# Patient Record
Sex: Female | Born: 1955 | Race: White | Hispanic: No | Marital: Married | Smoking: Never smoker
Health system: Southern US, Community
[De-identification: ages and names within clinical notes are randomized; demographics above are authoritative.]

## PROBLEM LIST (undated history)

## (undated) DIAGNOSIS — IMO0002 Reserved for concepts with insufficient information to code with codable children: Secondary | ICD-10-CM

## (undated) DIAGNOSIS — Z9889 Other specified postprocedural states: Secondary | ICD-10-CM

## (undated) DIAGNOSIS — K3184 Gastroparesis: Secondary | ICD-10-CM

## (undated) DIAGNOSIS — R51 Headache: Secondary | ICD-10-CM

## (undated) DIAGNOSIS — F419 Anxiety disorder, unspecified: Secondary | ICD-10-CM

## (undated) DIAGNOSIS — N189 Chronic kidney disease, unspecified: Principal | ICD-10-CM

## (undated) DIAGNOSIS — Z8719 Personal history of other diseases of the digestive system: Secondary | ICD-10-CM

## (undated) DIAGNOSIS — E119 Type 2 diabetes mellitus without complications: Secondary | ICD-10-CM

## (undated) DIAGNOSIS — I10 Essential (primary) hypertension: Secondary | ICD-10-CM

## (undated) DIAGNOSIS — M1990 Unspecified osteoarthritis, unspecified site: Secondary | ICD-10-CM

## (undated) DIAGNOSIS — R112 Nausea with vomiting, unspecified: Secondary | ICD-10-CM

## (undated) DIAGNOSIS — K219 Gastro-esophageal reflux disease without esophagitis: Secondary | ICD-10-CM

## (undated) DIAGNOSIS — G4730 Sleep apnea, unspecified: Secondary | ICD-10-CM

## (undated) DIAGNOSIS — D631 Anemia in chronic kidney disease: Secondary | ICD-10-CM

## (undated) DIAGNOSIS — R519 Headache, unspecified: Secondary | ICD-10-CM

## (undated) HISTORY — DX: Anemia in chronic kidney disease: D63.1

## (undated) HISTORY — PX: CARDIAC CATHETERIZATION: SHX172

## (undated) HISTORY — PX: EYE SURGERY: SHX253

## (undated) HISTORY — PX: CARPAL TUNNEL RELEASE: SHX101

## (undated) HISTORY — DX: Chronic kidney disease, unspecified: N18.9

---

## 1990-06-26 HISTORY — PX: ABDOMINAL HYSTERECTOMY: SUR658

## 1999-04-28 ENCOUNTER — Encounter: Admission: RE | Admit: 1999-04-28 | Discharge: 1999-04-28 | Payer: Self-pay | Admitting: Family Medicine

## 1999-04-28 ENCOUNTER — Encounter: Payer: Self-pay | Admitting: Family Medicine

## 2001-04-30 ENCOUNTER — Encounter: Admission: RE | Admit: 2001-04-30 | Discharge: 2001-04-30 | Payer: Self-pay | Admitting: Family Medicine

## 2001-04-30 ENCOUNTER — Encounter: Payer: Self-pay | Admitting: Family Medicine

## 2001-05-17 ENCOUNTER — Encounter: Admission: RE | Admit: 2001-05-17 | Discharge: 2001-08-15 | Payer: Self-pay | Admitting: Family Medicine

## 2001-06-03 ENCOUNTER — Encounter: Payer: Self-pay | Admitting: Emergency Medicine

## 2001-06-03 ENCOUNTER — Emergency Department (HOSPITAL_COMMUNITY): Admission: EM | Admit: 2001-06-03 | Discharge: 2001-06-03 | Payer: Self-pay | Admitting: Emergency Medicine

## 2001-07-07 ENCOUNTER — Encounter: Payer: Self-pay | Admitting: Emergency Medicine

## 2001-07-07 ENCOUNTER — Observation Stay (HOSPITAL_COMMUNITY): Admission: EM | Admit: 2001-07-07 | Discharge: 2001-07-08 | Payer: Self-pay | Admitting: Emergency Medicine

## 2002-04-01 ENCOUNTER — Encounter: Admission: RE | Admit: 2002-04-01 | Discharge: 2002-04-01 | Payer: Self-pay | Admitting: Family Medicine

## 2002-04-01 ENCOUNTER — Encounter: Payer: Self-pay | Admitting: Family Medicine

## 2002-06-05 ENCOUNTER — Encounter: Admission: RE | Admit: 2002-06-05 | Discharge: 2002-06-05 | Payer: Self-pay | Admitting: Family Medicine

## 2002-06-05 ENCOUNTER — Encounter: Payer: Self-pay | Admitting: Family Medicine

## 2002-06-30 ENCOUNTER — Ambulatory Visit (HOSPITAL_COMMUNITY): Admission: RE | Admit: 2002-06-30 | Discharge: 2002-06-30 | Payer: Self-pay | Admitting: *Deleted

## 2002-09-04 ENCOUNTER — Encounter: Payer: Self-pay | Admitting: Family Medicine

## 2002-09-04 ENCOUNTER — Encounter: Admission: RE | Admit: 2002-09-04 | Discharge: 2002-09-04 | Payer: Self-pay | Admitting: Family Medicine

## 2002-09-19 ENCOUNTER — Ambulatory Visit (HOSPITAL_BASED_OUTPATIENT_CLINIC_OR_DEPARTMENT_OTHER): Admission: RE | Admit: 2002-09-19 | Discharge: 2002-09-19 | Payer: Self-pay | Admitting: *Deleted

## 2002-10-10 ENCOUNTER — Ambulatory Visit (HOSPITAL_COMMUNITY): Admission: RE | Admit: 2002-10-10 | Discharge: 2002-10-10 | Payer: Self-pay | Admitting: *Deleted

## 2002-10-10 ENCOUNTER — Encounter: Payer: Self-pay | Admitting: *Deleted

## 2002-10-30 ENCOUNTER — Ambulatory Visit (HOSPITAL_COMMUNITY): Admission: RE | Admit: 2002-10-30 | Discharge: 2002-10-30 | Payer: Self-pay | Admitting: *Deleted

## 2003-10-14 ENCOUNTER — Other Ambulatory Visit (HOSPITAL_COMMUNITY): Admission: RE | Admit: 2003-10-14 | Discharge: 2003-10-23 | Payer: Self-pay | Admitting: Psychiatry

## 2004-07-01 ENCOUNTER — Observation Stay (HOSPITAL_COMMUNITY): Admission: EM | Admit: 2004-07-01 | Discharge: 2004-07-03 | Payer: Self-pay | Admitting: Emergency Medicine

## 2005-05-01 ENCOUNTER — Encounter: Admission: RE | Admit: 2005-05-01 | Discharge: 2005-05-01 | Payer: Self-pay | Admitting: Family Medicine

## 2005-09-28 ENCOUNTER — Encounter: Admission: RE | Admit: 2005-09-28 | Discharge: 2005-09-28 | Payer: Self-pay | Admitting: Family Medicine

## 2006-07-13 ENCOUNTER — Ambulatory Visit: Payer: Self-pay | Admitting: Pulmonary Disease

## 2006-12-26 ENCOUNTER — Encounter: Admission: RE | Admit: 2006-12-26 | Discharge: 2006-12-26 | Payer: Self-pay | Admitting: Family Medicine

## 2007-01-30 ENCOUNTER — Ambulatory Visit (HOSPITAL_COMMUNITY): Admission: RE | Admit: 2007-01-30 | Discharge: 2007-01-30 | Payer: Self-pay | Admitting: Surgery

## 2007-01-31 ENCOUNTER — Ambulatory Visit (HOSPITAL_COMMUNITY): Admission: RE | Admit: 2007-01-31 | Discharge: 2007-01-31 | Payer: Self-pay | Admitting: Surgery

## 2007-02-01 ENCOUNTER — Encounter: Admission: RE | Admit: 2007-02-01 | Discharge: 2007-03-26 | Payer: Self-pay | Admitting: Surgery

## 2007-03-22 ENCOUNTER — Ambulatory Visit (HOSPITAL_COMMUNITY): Admission: RE | Admit: 2007-03-22 | Discharge: 2007-03-22 | Payer: Self-pay | Admitting: Surgery

## 2007-06-04 ENCOUNTER — Encounter: Admission: RE | Admit: 2007-06-04 | Discharge: 2007-09-02 | Payer: Self-pay | Admitting: Surgery

## 2007-06-25 ENCOUNTER — Inpatient Hospital Stay (HOSPITAL_COMMUNITY): Admission: RE | Admit: 2007-06-25 | Discharge: 2007-06-27 | Payer: Self-pay | Admitting: Surgery

## 2007-06-26 ENCOUNTER — Encounter (INDEPENDENT_AMBULATORY_CARE_PROVIDER_SITE_OTHER): Payer: Self-pay | Admitting: Surgery

## 2007-06-26 ENCOUNTER — Ambulatory Visit: Payer: Self-pay | Admitting: Vascular Surgery

## 2007-06-27 HISTORY — PX: GASTRIC BYPASS: SHX52

## 2007-10-10 ENCOUNTER — Encounter: Admission: RE | Admit: 2007-10-10 | Discharge: 2007-10-10 | Payer: Self-pay | Admitting: Surgery

## 2009-09-10 ENCOUNTER — Emergency Department (HOSPITAL_BASED_OUTPATIENT_CLINIC_OR_DEPARTMENT_OTHER): Admission: EM | Admit: 2009-09-10 | Discharge: 2009-09-10 | Payer: Self-pay | Admitting: Emergency Medicine

## 2009-11-13 ENCOUNTER — Inpatient Hospital Stay (HOSPITAL_COMMUNITY): Admission: EM | Admit: 2009-11-13 | Discharge: 2009-11-15 | Payer: Self-pay | Admitting: Emergency Medicine

## 2009-11-14 ENCOUNTER — Encounter (INDEPENDENT_AMBULATORY_CARE_PROVIDER_SITE_OTHER): Payer: Self-pay | Admitting: Internal Medicine

## 2010-02-09 ENCOUNTER — Encounter: Admission: RE | Admit: 2010-02-09 | Discharge: 2010-02-09 | Payer: Self-pay | Admitting: Obstetrics and Gynecology

## 2010-09-12 LAB — COMPREHENSIVE METABOLIC PANEL
ALT: 13 U/L (ref 0–35)
AST: 14 U/L (ref 0–37)
Albumin: 3.9 g/dL (ref 3.5–5.2)
Alkaline Phosphatase: 45 U/L (ref 39–117)
BUN: 54 mg/dL — ABNORMAL HIGH (ref 6–23)
CO2: 26 mEq/L (ref 19–32)
Calcium: 9.7 mg/dL (ref 8.4–10.5)
Chloride: 104 mEq/L (ref 96–112)
Creatinine, Ser: 0.97 mg/dL (ref 0.4–1.2)
GFR calc Af Amer: >60 mL/min (ref >60)
GFR calc non Af Amer: 60 mL/min — ABNORMAL LOW (ref >60)
Glucose, Bld: 283 mg/dL — ABNORMAL HIGH (ref 70–99)
Potassium: 5.1 mEq/L (ref 3.5–5.1)
Sodium: 137 mEq/L (ref 135–145)
Total Bilirubin: 0.9 mg/dL (ref 0.3–1.2)
Total Protein: 6.9 g/dL (ref 6.0–8.3)

## 2010-09-12 LAB — GLUCOSE, CAPILLARY
Glucose-Capillary: 102 mg/dL — ABNORMAL HIGH (ref 70–99)
Glucose-Capillary: 119 mg/dL — ABNORMAL HIGH (ref 70–99)
Glucose-Capillary: 124 mg/dL — ABNORMAL HIGH (ref 70–99)
Glucose-Capillary: 143 mg/dL — ABNORMAL HIGH (ref 70–99)
Glucose-Capillary: 147 mg/dL — ABNORMAL HIGH (ref 70–99)
Glucose-Capillary: 148 mg/dL — ABNORMAL HIGH (ref 70–99)
Glucose-Capillary: 177 mg/dL — ABNORMAL HIGH (ref 70–99)
Glucose-Capillary: 177 mg/dL — ABNORMAL HIGH (ref 70–99)
Glucose-Capillary: 211 mg/dL — ABNORMAL HIGH (ref 70–99)
Glucose-Capillary: 217 mg/dL — ABNORMAL HIGH (ref 70–99)
Glucose-Capillary: 220 mg/dL — ABNORMAL HIGH (ref 70–99)
Glucose-Capillary: 245 mg/dL — ABNORMAL HIGH (ref 70–99)

## 2010-09-12 LAB — TYPE AND SCREEN
ABO/RH(D): O POS
Antibody Screen: NEGATIVE

## 2010-09-12 LAB — DIFFERENTIAL
Basophils Absolute: 0 10*3/uL (ref 0.0–0.1)
Basophils Relative: 0 % (ref 0–1)
Eosinophils Absolute: 0 10*3/uL (ref 0.0–0.7)
Eosinophils Relative: 0 % (ref 0–5)
Lymphocytes Relative: 13 % (ref 12–46)
Lymphs Abs: 1.7 10*3/uL (ref 0.7–4.0)
Monocytes Absolute: 0.5 10*3/uL (ref 0.1–1.0)
Monocytes Relative: 4 % (ref 3–12)
Neutro Abs: 11.4 10*3/uL — ABNORMAL HIGH (ref 1.7–7.7)
Neutrophils Relative %: 83 % — ABNORMAL HIGH (ref 43–77)

## 2010-09-12 LAB — CBC
HCT: 30.8 % — ABNORMAL LOW (ref 36.0–46.0)
Hemoglobin: 10.2 g/dL — ABNORMAL LOW (ref 12.0–15.0)
MCHC: 33 g/dL (ref 30.0–36.0)
MCV: 89.1 fL (ref 78.0–100.0)
Platelets: 298 10*3/uL (ref 150–400)
RBC: 3.46 MIL/uL — ABNORMAL LOW (ref 3.87–5.11)
RDW: 13.8 % (ref 11.5–15.5)
WBC: 13.7 10*3/uL — ABNORMAL HIGH (ref 4.0–10.5)

## 2010-09-12 LAB — POCT I-STAT, CHEM 8
BUN: 54 mg/dL — ABNORMAL HIGH (ref 6–23)
Calcium, Ion: 1.3 mmol/L (ref 1.12–1.32)
Chloride: 106 mEq/L (ref 96–112)
Creatinine, Ser: 0.8 mg/dL (ref 0.4–1.2)
Glucose, Bld: 272 mg/dL — ABNORMAL HIGH (ref 70–99)
HCT: 33 % — ABNORMAL LOW (ref 36.0–46.0)
Hemoglobin: 11.2 g/dL — ABNORMAL LOW (ref 12.0–15.0)
Potassium: 5.2 mEq/L — ABNORMAL HIGH (ref 3.5–5.1)
Sodium: 138 mEq/L (ref 135–145)
TCO2: 25 mmol/L (ref 0–100)

## 2010-09-12 LAB — PROTIME-INR
INR: 1.15 (ref 0.00–1.49)
Prothrombin Time: 14.6 seconds (ref 11.6–15.2)

## 2010-09-12 LAB — HEMOGLOBIN AND HEMATOCRIT, BLOOD
HCT: 23.3 % — ABNORMAL LOW (ref 36.0–46.0)
HCT: 24.9 % — ABNORMAL LOW (ref 36.0–46.0)
HCT: 25.2 % — ABNORMAL LOW (ref 36.0–46.0)
HCT: 25.7 % — ABNORMAL LOW (ref 36.0–46.0)
HCT: 25.9 % — ABNORMAL LOW (ref 36.0–46.0)
HCT: 27.1 % — ABNORMAL LOW (ref 36.0–46.0)
Hemoglobin: 7.6 g/dL — ABNORMAL LOW (ref 12.0–15.0)
Hemoglobin: 8.2 g/dL — ABNORMAL LOW (ref 12.0–15.0)
Hemoglobin: 8.4 g/dL — ABNORMAL LOW (ref 12.0–15.0)
Hemoglobin: 8.5 g/dL — ABNORMAL LOW (ref 12.0–15.0)
Hemoglobin: 8.6 g/dL — ABNORMAL LOW (ref 12.0–15.0)
Hemoglobin: 8.8 g/dL — ABNORMAL LOW (ref 12.0–15.0)

## 2010-09-12 LAB — BASIC METABOLIC PANEL
BUN: 39 mg/dL — ABNORMAL HIGH (ref 6–23)
CO2: 25 mEq/L (ref 19–32)
Calcium: 8.5 mg/dL (ref 8.4–10.5)
Chloride: 111 mEq/L (ref 96–112)
Creatinine, Ser: 0.84 mg/dL (ref 0.4–1.2)
GFR calc Af Amer: >60 mL/min (ref >60)
GFR calc non Af Amer: >60 mL/min (ref >60)
Glucose, Bld: 138 mg/dL — ABNORMAL HIGH (ref 70–99)
Potassium: 4.5 mEq/L (ref 3.5–5.1)
Sodium: 139 mEq/L (ref 135–145)

## 2010-09-12 LAB — HEMOGLOBIN A1C
Hgb A1c MFr Bld: 7.4 % — ABNORMAL HIGH (ref <5.7)
Mean Plasma Glucose: 166 mg/dL — ABNORMAL HIGH (ref <117)

## 2010-09-12 LAB — ABO/RH: ABO/RH(D): O POS

## 2010-09-12 LAB — PREPARE RBC (CROSSMATCH)

## 2010-09-12 LAB — LIPASE, BLOOD: Lipase: 31 U/L (ref 11–59)

## 2010-09-12 LAB — HEMOCCULT GUIAC POC 1CARD (OFFICE): Fecal Occult Bld: POSITIVE

## 2010-09-19 LAB — BASIC METABOLIC PANEL
BUN: 35 mg/dL — ABNORMAL HIGH (ref 6–23)
CO2: 23 mEq/L (ref 19–32)
Calcium: 9.4 mg/dL (ref 8.4–10.5)
Chloride: 107 mEq/L (ref 96–112)
Creatinine, Ser: 1.2 mg/dL (ref 0.4–1.2)
GFR calc Af Amer: 56 mL/min — ABNORMAL LOW (ref >60)
GFR calc non Af Amer: 47 mL/min — ABNORMAL LOW (ref >60)
Glucose, Bld: 114 mg/dL — ABNORMAL HIGH (ref 70–99)
Potassium: 5.1 mEq/L (ref 3.5–5.1)
Sodium: 142 mEq/L (ref 135–145)

## 2010-11-08 NOTE — Op Note (Signed)
Kelly Soto, Kelly Soto                 ACCOUNT NO.:  [id number]   MEDICAL RECORD NO.:  [id number]          PATIENT TYPE:  INP   LOCATION:  0002                         FACILITY:  [facility]   PHYSICIAN:  Isabel Caprice. Hassell Done, MD  DATE OF BIRTH:  Aug 18, 1955   DATE OF PROCEDURE:  06/25/2007  DATE OF DISCHARGE:                               OPERATIVE REPORT   PREOPERATIVE DIAGNOSES:  1. Morbid obesity BMI of 50.  2. Diabetes mellitus.  3. Hypertension.  4. Gastroesophageal reflux disease.  5. She also has sleep apnea.   PROCEDURE:  Laparoscopic Roux-en-Y gastric bypass with a 40-cm bilo  pancreatic limb, a 1 meter Roux limb, a 4 cm long pouch, and an  anticolic Roux limb with closure of Peterson's defect.  30 minutes of  adhesiolysis time for the large omentum stuck from her previous  Pfannenstiel incision.   SURGEON:  Dr. Johnathan Hausen.   ASSISTANT:  [employer].   ANESTHESIA:  General endotracheal.   OPERATIVE TIME:  2 hours and 40 minutes.   DESCRIPTION OF PROCEDURE:  The patient was taken to room one on 12/30  and given general anesthesia.  The abdomen was prepped with Technicare  and draped sterilely.  I entered the abdomen through the left upper  quadrant using an OptiVu technique without difficulty, insufflating the  abdomen and placing the usual trocars for a gastric bypass.  First, I  had to take down adhesions in the pelvis to free the omentum and I did  that with Harmonic scalpel.  I took these down from the upper port and  this took about 30 minutes to free these in their entirety and then once  that was done I was able to lift the omentum up over the colon and  identified the ligament Treitz.  From there we measured 40 cm downstream  and then divided the small intestine with the laparoscopic stapling  device using the 60 and harmonic division of some mesentery.  A Penrose  drain was sewn to the Roux limb.  I then measured 1 meter x 4 cm and  then aligned the bowel in a  side-to-side fashion maintaining the  orientation and put a stitch between the antimesenteric limbs.  The  bowel was opened on the antimesenteric side and then using the harmonic  scalpel.  The stapler was inserted, clamped and fired.  Nice anastomosis  was present and the common defect was closed with 2-0 Vicryl's.  I  tested it and did not see any openings and then put Tisseel on it.  I  also closed the mesenteric defect with a running 2-0 silk.   Next, the upper abdomen was exposed using the Nathanson retractor and I  dissected at the EG junction to the left and then measured down about 4-  5 cm on the lesser curvature side, dissected in and then got behind the  stomach.  There I passed a stapler and with multiple applications of the  Covidien stapler was usually 6 cm.  I divided the stomach into a small  pouch.  Proximally I controlled bleeding  along the suture area with  harmonic and eventually put some Tisseel up there as well.  No active  bleeding was seen at the end of this portion the procedure.  I then  brought the Roux limb up and sutured along the back wall of the stomach  with a running 2-0 Vicryl.  I opened the limb on the right side, because  this candy cane lay with end to the left.  Openings were made in both  the jejunum and the stomach and then the stapler was passed and fired  creating a nice gastrojejunostomy.  This common defect was closed with 2-  0 Vicryl using the Ethicon Endo stitch and then the Ewald tube was  passed through that opening and then was oversewn with a running 2-0  Vicryl.  Once Lapper tie  placed at both the ends and it was snug, I  then closed Peterson's defect with 2-0 silks between the mesentery,  transverse colon and the Roux limb.  The anastomosis was then inspected  when  Dr. Excell Seltzer endoscoped the patient and insufflated and the  gastrojejunostomy was placed under water and no bubbles were seen.  The  gastric pouch was then decompressed  and withdrawn.  Tisseel was applied  to the gastrojejunostomy.  The abdomen was deflated.  The wounds were  infiltrated with Marcaine and closed with 4-0 Vicryl and with staples.  The patient tolerated the procedure well and was taken to recovery room  in satisfactory condition.      Isabel Caprice Hassell Done, MD  Electronically Signed     MBM/MEDQ  D:  06/25/2007  T:  06/25/2007  Job:  [id number]   cc:   Osvaldo Human, M.D.  Fax: [phone]   Jacelyn Pi, M.D.  Fax: [phone]

## 2010-11-08 NOTE — Op Note (Signed)
NAMEMARCHELLE, Kelly Soto                 ACCOUNT NO.:  [id number]   MEDICAL RECORD NO.:  [id number]          PATIENT TYPE:  INP   LOCATION:  1229                         FACILITY:  [facility]   PHYSICIAN:  [name] T. Hoxworth, M.D.DATE OF BIRTH:  20-Jun-1956   DATE OF PROCEDURE:  06/25/2007  DATE OF DISCHARGE:                               OPERATIVE REPORT   PROCEDURE:  Upper GI endoscopy.   DESCRIPTION OF PROCEDURE:  Upper GI endoscopy is performed at the  completion of laparoscopic Roux-en-Y gastric bypass intraoperatively.  With Dr. Hassell Done clamping the outlet of the gastrojejunostomy and with  the anastomosis under saline.  The video endoscope was inserted into the  upper esophagus, then and passed under direct vision to the EG junction  at 42 cm from the incisors.  The small pouch was entered.  He was  distended tensely with air and there was no evidence of leakage.  The  anastomosis was visualized and patent.  Suture staple lines were intact  and without bleeding.  The pouch measured about 4 cm in length.  Following completion of procedure the pouch was desufflated and the  scope withdrawn without incident.      Darene Lamer. Hoxworth, M.D.  Electronically Signed     BTH/MEDQ  D:  06/25/2007  T:  06/26/2007  Job:  [id number]

## 2010-11-11 NOTE — H&P (Signed)
Kelly Soto                 ACCOUNT NO.:  [id number]   MEDICAL RECORD NO.:  [id number]          PATIENT TYPE:  EMS   LOCATION:  ED                           FACILITY:  [facility]   PHYSICIAN:  Jerelene Redden, MD      DATE OF BIRTH:  June 12, 1956   DATE OF ADMISSION:  07/01/2004  DATE OF DISCHARGE:                                HISTORY & PHYSICAL   HISTORY OF PRESENT ILLNESS:  Kelly Soto is a 55 year old obese lady who  states that about 6 weeks ago, she began Weight Watcher's program. She  rapidly lost weight and estimates that she has lost about 34 pounds. In  light of her weight loss, she decided to stop all of her diabetes  medications about 4 weeks ago. Over the last 2 weeks, she has developed  nausea, anorexia, increased thirst, urinary frequency and malaise. She has  noticed that she becomes dyspneic with limited exertion. She described these  symptoms to Dr. Roderick Pee today and was advised to come to the emergency room  where she was found to have initial blood glucose of 468 and CO2 of 12. She  is therefore admitted at this time for treatment of diabetic ketoacidosis.  She denies recent history of fever, cough, chest pain, abdominal pain,  hematemesis, melena, or dysuria. She has had at least 2 episodes of  vomiting.   MEDICATIONS:  Consist of Zoloft 50 mg daily, Wellbutrin possibly 150 mg  daily, Avalide 1 tablet daily (she is uncertain of the dose). She also is  currently taking Avandia 4 mg b.i.d. and Lantus insulin 70 units at bedtime,  which as mentioned previously, she discontinued 4 weeks ago.   PAST SURGICAL HISTORY:  She has had a previous hysterectomy 12 years ago.  She estimates about 2001, she had a heart catheterization and was told that  the study was normal. She cannot recall any other operations.   PAST MEDICAL HISTORY:  1.  Diabetes. The patient was diagnosed with diabetes approximately 7 years      ago. She says that she is checked on a regular basis by  Dr. Roderick Pee.      She thinks that she had an A1C hemoglobin done in October but does not      know the result. In general, until recently, she was compliant with her      medications.  2.  Hypertension. This is said to be well regulated.  3.  Depression. The patient has a long standing history of depression and is      currently maintained on Zoloft and Wellbutrin as noted above.   FAMILY HISTORY:  The patient has 2 siblings who are in good health. She does  not recall anyone in her family who has diabetes.   SOCIAL HISTORY:  She does not smoke. She denies the use of alcohol. She does  not use drugs.   REVIEW OF SYSTEMS:  HEENT:  Head:  She denies headache or dizziness. Eyes:  She denies visual blurring or diplopia. ENT:  Denies earache, sinus pain or  sore throat. CHEST:  See above. CARDIOVASCULAR:  See above.  GASTROINTESTINAL:  See above. GENITOURINARY:  She denies dysuria or  hematuria. She has noticed urinary frequency. NEUROLOGIC:  There is no  history of seizure or stroke. ENDO:  See above.   PHYSICAL EXAMINATION:  VITAL SIGNS:  Her blood pressure was initially 180/90  but subsequently when this was re-checked, it was 144/75. Pulse 89,  respiratory rate 18, O2 saturation is 100%.  HEENT:  Examination is within normal limits.  CHEST:  Quite clear.  BACK:  No CVA or point tenderness.  CARDIOVASCULAR:  Normal S1 and S2. There are no rubs, murmurs or gallops.  The remainder of the patient's examination was quite difficult because she  was seen in the hall of the emergency room.  ABDOMEN:  A limited examination was benign. There was no guarding or  rebound. No abdominal tenderness.  NEUROLOGIC:  Testing was within normal limits.  EXTREMITIES:  No evidence of cyanosis or edema.   LABORATORY DATA:  Blood studies included O2 saturation of 97%. The white  count was 10,900. Hemoglobin 14.1. Platelet count was 292,000. Sodium 130,  potassium 4.6, chloride 101, CO2 12, glucose 468,  creatinine 1.4, BUN 19.   IMPRESSION:  1.  Diabetic ketoacidosis secondary to non-compliance with insulin regimen.  2.  Diabetes x8 years.  3.  Morbid obesity.  4.  Hypertension.  5.  Status post hysterectomy.  6.  History of normal cardiac catheterization.  7.  Depression.   PLAN:  The patient will be placed on Glucomander intravenous insulin  protocol. She will receive intravenous fluids in a vigorous fashion. We will  monitor electrolytes closely, particularly her potassium level. When her  acidosis has corrected, will put her back on a schedule of Lantus insulin  and sliding scale insulin regimen. Her other medications will be continued.      SY/MEDQ  D:  07/01/2004  T:  07/01/2004  Job:  [id number]   cc:   Osvaldo Human, M.D.  [address]  [place] [address]  Fax: [phone]

## 2010-11-11 NOTE — Cardiovascular Report (Signed)
NAME:  Kelly Soto, Kelly Soto                           ACCOUNT NO.:  [id number]   MEDICAL RECORD NO.:  [id number]                   PATIENT TYPE:  OIB   LOCATION:  2866                                 FACILITY:  [facility]   PHYSICIAN:  Fabio Asa, M.D.                 DATE OF BIRTH:  08-Aug-1955   DATE OF PROCEDURE:  06/30/2002  DATE OF DISCHARGE:                              CARDIAC CATHETERIZATION   INDICATIONS FOR PROCEDURE:  Atypical chest pain with reversal noted on  recent Cardiolite.   DESCRIPTION OF PROCEDURE:  After obtaining written informed consent the  patient was brought to the cardiac catheterization laboratory in the post-  absorptive state. Preoperative sedation was achieved using IV Versed and the  right groin was prepped and draped in the usual sterile fashion.  Local  anesthesia was achieved using 1% Xylocaine.  A 6 French hemostasis sheath  was placed into the right femoral artery.  Selective coronary angiography  was performed using a JL4, JR4 and Judkins catheters.  Multiple views were  obtained.  All catheter exchanges were made over a guide wire.  The  hemostasis sheath was flushed following each engagement.  The patient was  given sublingual nitroglycerin spray prior to engagement of the right  coronary artery to decrease the incidence of coronary spasm.   FINDINGS:  1. The aortic pressure was 144/78.  2. The LV pressure was 141/4 with an EDP of 9.  There was no gradient noted     on pullback.  3. Single plane ventriculogram revealed mild anterior apical hypokinesis     with an estimated ejection fraction of 60%.   CORONARY ANGIOGRAPHY:  1. The left main coronary artery bifurcates into the left anterior     descending and circumflex vessel.  The left main coronary artery is a     long artery without significant disease.  2. Left anterior descending coronary artery:  The left anterior descending     gives rise to a small D1, a larger D2, a moderate to large D3  and then     goes on to end as a tapering apical branch.  There is no disease in the     left anterior descending or its branches.  3. Circumflex vessel:  The circumflex vessel is a moderate size vessel that     gives rise to an early OM1, a larger OM2 and goes on to end as an AV     groove vessel.  There is no disease noted in the circumflex or its     branches.  4. Right coronary artery:  The right coronary artery is a large dominant     artery that gives rise to one RV marginal, a large PDA and then goes on     to end as a PDA branch.  There is no disease in the right coronary artery  or its branches.   FINAL IMPRESSION:  1. Positive stress Cardiolite.  2. Normal left ventricular function with an ejection fraction of 86%.    RECOMMENDATIONS:  1. Consider other etiologies for her chest pain.  2. A sleep study needs to be performed.  3. Diet and exercise to promote weight reduction.                                               Fabio Asa, M.D.    HP/MEDQ  D:  06/30/2002  T:  06/30/2002  Job:  [id number]   cc:   Osvaldo Human, M.D.  [address] [place]  [place] [address]  Fax: [phone]

## 2010-12-02 ENCOUNTER — Other Ambulatory Visit: Payer: Self-pay | Admitting: Otolaryngology

## 2010-12-07 ENCOUNTER — Ambulatory Visit
Admission: RE | Admit: 2010-12-07 | Discharge: 2010-12-07 | Disposition: A | Discharge status: Home / Self Care | Payer: Managed Care, Other (non HMO) | Source: Ambulatory Visit | Attending: Otolaryngology | Admitting: Otolaryngology

## 2010-12-29 ENCOUNTER — Encounter: Payer: Managed Care, Other (non HMO) | Admitting: Oncology

## 2011-01-05 ENCOUNTER — Encounter (HOSPITAL_BASED_OUTPATIENT_CLINIC_OR_DEPARTMENT_OTHER): Payer: Managed Care, Other (non HMO) | Admitting: Oncology

## 2011-01-05 ENCOUNTER — Other Ambulatory Visit (HOSPITAL_COMMUNITY): Payer: Self-pay | Admitting: Oncology

## 2011-01-05 DIAGNOSIS — D649 Anemia, unspecified: Secondary | ICD-10-CM

## 2011-01-05 LAB — CBC & DIFF AND RETIC
BASO%: 0.4 % (ref 0.0–2.0)
Basophils Absolute: 0 10*3/uL (ref 0.0–0.1)
EOS%: 0.7 % (ref 0.0–7.0)
Eosinophils Absolute: 0.1 10*3/uL (ref 0.0–0.5)
HCT: 31.3 % — ABNORMAL LOW (ref 34.8–46.6)
HGB: 10.3 g/dL — ABNORMAL LOW (ref 11.6–15.9)
Immature Retic Fract: 4.4 % (ref 0.00–10.70)
LYMPH%: 32.5 % (ref 14.0–49.7)
MCH: 28.5 pg (ref 25.1–34.0)
MCHC: 32.9 g/dL (ref 31.5–36.0)
MCV: 86.7 fL (ref 79.5–101.0)
MONO#: 0.6 10*3/uL (ref 0.1–0.9)
MONO%: 6.7 % (ref 0.0–14.0)
NEUT#: 5 10*3/uL (ref 1.5–6.5)
NEUT%: 59.7 % (ref 38.4–76.8)
Platelets: 231 10*3/uL (ref 145–400)
RBC: 3.61 10*6/uL — ABNORMAL LOW (ref 3.70–5.45)
RDW: 13.3 % (ref 11.2–14.5)
Retic %: 0.95 % (ref 0.50–1.50)
Retic Ct Abs: 34.3 10*3/uL (ref 18.30–72.70)
WBC: 8.3 10*3/uL (ref 3.9–10.3)
lymph#: 2.7 10*3/uL (ref 0.9–3.3)
nRBC: 0 % (ref 0–0)

## 2011-01-05 LAB — MORPHOLOGY: PLT EST: ADEQUATE

## 2011-01-05 LAB — CHCC SMEAR

## 2011-01-10 LAB — TRANSFERRIN RECEPTOR, SOLUABLE: Transferrin Receptor, Soluble: 1 mg/L (ref 0.76–1.76)

## 2011-01-10 LAB — COMPREHENSIVE METABOLIC PANEL
ALT: 21 U/L (ref 0–35)
AST: 18 U/L (ref 0–37)
Albumin: 4.1 g/dL (ref 3.5–5.2)
Alkaline Phosphatase: 66 U/L (ref 39–117)
BUN: 15 mg/dL (ref 6–23)
CO2: 25 mEq/L (ref 19–32)
Calcium: 9.8 mg/dL (ref 8.4–10.5)
Chloride: 104 mEq/L (ref 96–112)
Creatinine, Ser: 0.78 mg/dL (ref 0.50–1.10)
Glucose, Bld: 140 mg/dL — ABNORMAL HIGH (ref 70–99)
Potassium: 4.6 mEq/L (ref 3.5–5.3)
Sodium: 138 mEq/L (ref 135–145)
Total Bilirubin: 0.2 mg/dL — ABNORMAL LOW (ref 0.3–1.2)
Total Protein: 6.7 g/dL (ref 6.0–8.3)

## 2011-01-10 LAB — LACTATE DEHYDROGENASE: LDH: 90 U/L — ABNORMAL LOW (ref 94–250)

## 2011-01-10 LAB — IRON AND TIBC
%SAT: 13 % — ABNORMAL LOW (ref 20–55)
Iron: 44 ug/dL (ref 42–145)
TIBC: 329 ug/dL (ref 250–470)
UIBC: 285 ug/dL

## 2011-01-10 LAB — VITAMIN B12: Vitamin B-12: 1358 pg/mL — ABNORMAL HIGH (ref 211–911)

## 2011-01-10 LAB — SEDIMENTATION RATE: Sed Rate: 9 mm/hr (ref 0–22)

## 2011-01-10 LAB — C-REACTIVE PROTEIN: CRP: 0.1 mg/dL (ref <0.6)

## 2011-01-10 LAB — FERRITIN: Ferritin: 40 ng/mL (ref 10–291)

## 2011-01-10 LAB — FOLATE RBC: RBC Folate: 1429 ng/mL (ref >=366)

## 2011-01-24 ENCOUNTER — Other Ambulatory Visit (HOSPITAL_COMMUNITY): Payer: Self-pay | Admitting: Oncology

## 2011-01-24 LAB — FECAL OCCULT BLOOD, GUAIAC: Occult Blood: NEGATIVE

## 2011-03-15 LAB — CBC
HCT: 25.3 — ABNORMAL LOW
Hemoglobin: 8.5 — ABNORMAL LOW
MCHC: 33.5
MCV: 78.7
Platelets: 203
RBC: 3.21 — ABNORMAL LOW
RDW: 15.9 — ABNORMAL HIGH
WBC: 10.9 — ABNORMAL HIGH

## 2011-03-15 LAB — DIFFERENTIAL
Basophils Absolute: 0
Basophils Relative: 0
Eosinophils Absolute: 0
Eosinophils Relative: 0
Lymphocytes Relative: 23
Lymphs Abs: 2.5
Monocytes Absolute: 0.8
Monocytes Relative: 8
Neutro Abs: 7.5
Neutrophils Relative %: 69

## 2011-03-28 DIAGNOSIS — H6980 Other specified disorders of Eustachian tube, unspecified ear: Secondary | ICD-10-CM | POA: Insufficient documentation

## 2011-03-28 DIAGNOSIS — E119 Type 2 diabetes mellitus without complications: Secondary | ICD-10-CM | POA: Insufficient documentation

## 2011-03-31 LAB — HEMOGLOBIN AND HEMATOCRIT, BLOOD
HCT: 26.1 — ABNORMAL LOW
HCT: 29.5 — ABNORMAL LOW
HCT: 33.1 — ABNORMAL LOW
Hemoglobin: 10 — ABNORMAL LOW
Hemoglobin: 11.1 — ABNORMAL LOW
Hemoglobin: 8.8 — ABNORMAL LOW

## 2011-03-31 LAB — URINALYSIS, ROUTINE W REFLEX MICROSCOPIC
Bilirubin Urine: NEGATIVE
Glucose, UA: 250 — AB
Hgb urine dipstick: NEGATIVE
Nitrite: NEGATIVE
Protein, ur: NEGATIVE
Specific Gravity, Urine: 1.008
Urobilinogen, UA: 0.2
pH: 5.5

## 2011-03-31 LAB — DIFFERENTIAL
Basophils Absolute: 0
Basophils Relative: 0
Eosinophils Absolute: 0
Eosinophils Relative: 0
Lymphocytes Relative: 6 — ABNORMAL LOW
Lymphs Abs: 0.8
Monocytes Absolute: 0.7
Monocytes Relative: 5
Neutro Abs: 12.2 — ABNORMAL HIGH
Neutrophils Relative %: 89 — ABNORMAL HIGH

## 2011-03-31 LAB — BASIC METABOLIC PANEL
BUN: 29 — ABNORMAL HIGH
CO2: 25
Calcium: 9.7
Chloride: 103
Creatinine, Ser: 1.19
GFR calc Af Amer: 58 — ABNORMAL LOW
GFR calc non Af Amer: 48 — ABNORMAL LOW
Glucose, Bld: 260 — ABNORMAL HIGH
Potassium: 5.4 — ABNORMAL HIGH
Sodium: 139

## 2011-03-31 LAB — CBC
HCT: 27.7 — ABNORMAL LOW
Hemoglobin: 9.3 — ABNORMAL LOW
MCHC: 33.6
MCV: 78.9
Platelets: 223
RBC: 3.51 — ABNORMAL LOW
RDW: 16.1 — ABNORMAL HIGH
WBC: 13.8 — ABNORMAL HIGH

## 2012-02-09 ENCOUNTER — Other Ambulatory Visit: Payer: Self-pay | Admitting: Family Medicine

## 2012-02-09 ENCOUNTER — Ambulatory Visit
Admission: RE | Admit: 2012-02-09 | Discharge: 2012-02-09 | Disposition: A | Discharge status: Home / Self Care | Payer: Managed Care, Other (non HMO) | Source: Ambulatory Visit | Attending: Family Medicine | Admitting: Family Medicine

## 2012-02-09 DIAGNOSIS — R519 Headache, unspecified: Secondary | ICD-10-CM

## 2012-12-12 ENCOUNTER — Other Ambulatory Visit: Payer: Self-pay | Admitting: Family Medicine

## 2012-12-12 DIAGNOSIS — Z1231 Encounter for screening mammogram for malignant neoplasm of breast: Secondary | ICD-10-CM

## 2012-12-24 ENCOUNTER — Ambulatory Visit
Admission: RE | Admit: 2012-12-24 | Discharge: 2012-12-24 | Disposition: A | Discharge status: Home / Self Care | Payer: Managed Care, Other (non HMO) | Source: Ambulatory Visit | Attending: Family Medicine | Admitting: Family Medicine

## 2012-12-24 DIAGNOSIS — Z1231 Encounter for screening mammogram for malignant neoplasm of breast: Secondary | ICD-10-CM

## 2013-03-04 ENCOUNTER — Encounter (HOSPITAL_COMMUNITY): Payer: Self-pay | Admitting: Emergency Medicine

## 2013-03-04 ENCOUNTER — Emergency Department (HOSPITAL_COMMUNITY)
Admission: EM | Admit: 2013-03-04 | Discharge: 2013-03-05 | Disposition: A | Discharge status: Home / Self Care | Payer: Managed Care, Other (non HMO) | Attending: Emergency Medicine | Admitting: Emergency Medicine

## 2013-03-04 DIAGNOSIS — Z79899 Other long term (current) drug therapy: Secondary | ICD-10-CM | POA: Insufficient documentation

## 2013-03-04 DIAGNOSIS — Z872 Personal history of diseases of the skin and subcutaneous tissue: Secondary | ICD-10-CM | POA: Insufficient documentation

## 2013-03-04 DIAGNOSIS — M79609 Pain in unspecified limb: Secondary | ICD-10-CM | POA: Insufficient documentation

## 2013-03-04 DIAGNOSIS — M62838 Other muscle spasm: Secondary | ICD-10-CM

## 2013-03-04 HISTORY — DX: Reserved for concepts with insufficient information to code with codable children: IMO0002

## 2013-03-04 MED ORDER — MORPHINE SULFATE 4 MG/ML IJ SOLN
6.0000 mg | Freq: Once | INTRAMUSCULAR | Status: AC
  Administered 2013-03-05: 6 mg via INTRAVENOUS
  Filled 2013-03-04: qty 2

## 2013-03-04 MED ORDER — DIAZEPAM 5 MG PO TABS
5.0000 mg | ORAL_TABLET | Freq: Once | ORAL | Status: AC
  Administered 2013-03-05: 5 mg via ORAL
  Filled 2013-03-04: qty 1

## 2013-03-04 NOTE — ED Provider Notes (Signed)
CSN: [id number]     Arrival date & time 03/04/13  2249 History   First MD Initiated Contact with Patient 03/04/13 2301     Chief Complaint  Patient presents with  . Back Pain    HPI Patient reports severe left groin and left medial thigh pain began this evening.  Her pain is severe at this time.  She was given 2 mcg up into my EMS.  She reports even minimal movement of her left leg seems to cause her pain in the medial thigh.  Her specific complaint is that when she attempts to flex at the left hip she has severe pain.  She denies rash.  She denies unilateral leg swelling.  She denies weakness of her left lower extremity.  She denies abdominal pain.  She has no back pain or flank pain at this time.  Yesterday she was having right flank pain and was seen by her physician and was prescribed a muscle relaxant for right lumbar spasm.  Now her pain is located in her left medial thigh.  No urinary or vaginal complaints.  No diarrhea.  Never had pain like this before.  History of gastric bypass and abdominal hysterectomy.  She is a diabetic.   Past Medical History  Diagnosis Date  . Ulcer    Past Surgical History  Procedure Laterality Date  . Abdominal hysterectomy  1992  . Gastric bypass  2009   History reviewed. No pertinent family history. History  Substance Use Topics  . Smoking status: Never Smoker   . Smokeless tobacco: Never Used  . Alcohol Use: No   OB History   Grav Para Term Preterm Abortions TAB SAB Ect Mult Living                 Review of Systems  All other systems reviewed and are negative.    Allergies  Nsaids and Ace inhibitors  Home Medications   Current Outpatient Rx  Name  Route  Sig  Dispense  Refill  . baclofen (LIORESAL) 10 MG tablet   Oral   Take 1 tablet by mouth 3 (three) times daily as needed. For migraine headache         . BENICAR HCT 40-25 MG per tablet   Oral   Take 1 tablet by mouth daily.         . Cyanocobalamin (VITAMIN B-12 PO)    Oral   Take 1 tablet by mouth daily.         [name] FeFum-FePoly-FA-B Cmp-C-Biot (INTEGRA PLUS) CAPS   Oral   Take 1 capsule by mouth daily.         . fluticasone (FLONASE) 50 MCG/ACT nasal spray   Nasal   Place 1 spray into the nose daily as needed. For nasal congestion         . glyBURIDE-metformin (GLUCOVANCE) 2.5-500 MG per tablet   Oral   Take 2 tablets by mouth 2 (two) times daily.         [name] omeprazole (PRILOSEC) 20 MG capsule   Oral   Take 20 mg by mouth daily.         . Pediatric Multiple Vit-C-FA (FLINSTONES GUMMIES OMEGA-3 DHA PO)   Oral   Take 2 tablets by mouth daily.         . promethazine (PHENERGAN) 25 MG suppository   Rectal   Place 25 mg rectally every 6 (six) hours as needed for nausea (for migraine induced nausea).         [name]  promethazine (PHENERGAN) 25 MG tablet   Oral   Take 25-50 mg by mouth every 8 (eight) hours as needed (for migraine induced nausea).         . topiramate (TOPAMAX) 25 MG tablet   Oral   Take 25-50 mg by mouth 2 (two) times daily. 50 mg in the morning and 25 mg at night         . diazepam (VALIUM) 5 MG tablet   Oral   Take 1 tablet (5 mg total) by mouth every 6 (six) hours as needed (spasm, pain).   12 tablet   0   . ibandronate (BONIVA) 150 MG tablet   Oral   Take 1 tablet by mouth every 30 (thirty) days.         [name] oxyCODONE-acetaminophen (ROXICET) 5-325 MG per tablet   Oral   Take 1 tablet by mouth every 4 (four) hours as needed for pain.   15 tablet   0    BP 94/58  Pulse 78  Temp(Src) 99.2 F (37.3 C) (Oral)  Resp 14  Ht 5\' 1"  (1.549 m)  Wt 134 lb (60.782 kg)  BMI 25.33 kg/m2  SpO2 98% Physical Exam  Nursing note and vitals reviewed. Constitutional: She is oriented to person, place, and time. She appears well-developed and well-nourished. No distress.  HENT:  Head: Normocephalic and atraumatic.  Eyes: EOM are normal.  Neck: Normal range of motion.  Cardiovascular: Normal rate, regular rhythm  and normal heart sounds.   Pulmonary/Chest: Effort normal and breath sounds normal.  Abdominal: Soft. She exhibits no distension. There is no tenderness.  Genitourinary:  No CVA tenderness  Musculoskeletal: Normal range of motion.  Patient has full range of motion of left ankle left knee and left hip.  Normal pulses in left foot.  No unilateral left lower extremity swelling.  The majority of patient's tenderness is located in her left medial thigh.  No palpable cord.  No swelling of her left leg.  No rash or erythema noted in this area.  No left groin lymphadenopathy noted.  Neurological: She is alert and oriented to person, place, and time.  Skin: Skin is warm and dry.  Psychiatric: She has a normal mood and affect. Judgment normal.    ED Course  Procedures (including critical care time) Labs Review Labs Reviewed  CBC - Abnormal; Notable for the following:    RBC 3.05 (*)    Hemoglobin 8.8 (*)    HCT 26.9 (*)    All other components within normal limits  BASIC METABOLIC PANEL - Abnormal; Notable for the following:    Sodium 132 (*)    Potassium 5.7 (*)    Glucose, Bld 180 (*)    BUN 36 (*)    Creatinine, Ser 1.60 (*)    GFR calc non Af Amer 34 (*)    GFR calc Af Amer 40 (*)    All other components within normal limits   Imaging Review Dg Hip Complete Left  03/05/2013   *RADIOLOGY REPORT*  Clinical Data: Left hip and thigh pain.  No known injury.  LEFT HIP - COMPLETE 2+ VIEW, LEFT FEMUR - 2 VIEW  Comparison: None.  Findings: Mild degenerative changes in the lower lumbar spine. Mild degenerative changes in both hips.  The pelvis appears intact. No displaced fractures are identified.  No focal bone lesions.  SI joints, pelvic rim, and symphysis pubis are not displaced.  Left hip and left femur appear intact.  No evidence  of acute fracture or subluxation.  No focal bone lesion or bone destruction. Bone cortex and trabecular architecture appear intact.  No radiopaque soft tissue foreign  bodies.  IMPRESSION: Pelvis, left hip, and left femur appear intact without evidence of any acute bony abnormality.  Mild degenerative changes in the left hip.   Original Report Authenticated By: Lucienne Capers, M.D.   Dg Femur Left  03/05/2013   *RADIOLOGY REPORT*  Clinical Data: Left hip and thigh pain.  No known injury.  LEFT HIP - COMPLETE 2+ VIEW, LEFT FEMUR - 2 VIEW  Comparison: None.  Findings: Mild degenerative changes in the lower lumbar spine. Mild degenerative changes in both hips.  The pelvis appears intact. No displaced fractures are identified.  No focal bone lesions.  SI joints, pelvic rim, and symphysis pubis are not displaced.  Left hip and left femur appear intact.  No evidence of acute fracture or subluxation.  No focal bone lesion or bone destruction. Bone cortex and trabecular architecture appear intact.  No radiopaque soft tissue foreign bodies.  IMPRESSION: Pelvis, left hip, and left femur appear intact without evidence of any acute bony abnormality.  Mild degenerative changes in the left hip.   Original Report Authenticated By: Lucienne Capers, M.D.   I personally reviewed the imaging tests through PACS system I reviewed available ER/hospitalization records through the EMR   MDM   1. Muscle spasm    Patient feels much better after pain medicine in the emergency department as well as Valium for muscle spasm.  All of her pain seems to be focused in her left medial thigh.  Doubt septic arthritis.  Oral temperature on my check is 98.5.  Vital signs do demonstrate oral temp 99.2.  She feels much better.  Discharge home with pain medicine and muscle relaxants.  A vas that she return to the ER for new or worsening symptoms.  She has a mild elevation of her BUN and creatinine.  Some of this may be secondary to dehydration.  I have asked that she drink lots of fluids and have her creatinine rechecked in 2-3 days.  Doubt myositis.    Hoy Morn, MD 03/05/13 [phone]

## 2013-03-04 NOTE — ED Notes (Signed)
Bed: [id number] Expected date: 03/04/13 Expected time: 9:49 PM Means of arrival: Ambulance Comments: Back pain

## 2013-03-04 NOTE — ED Notes (Signed)
Per EMS: yesterday, heavy lifting 2 days prior,right lower back and left leg, seen by PCP earlier today and received toradol and that help, woke up with pain. Has received 2100mcg fent, and zofran, can have IM and IV torodal but cant have oral.

## 2013-03-05 ENCOUNTER — Emergency Department (HOSPITAL_COMMUNITY): Payer: Managed Care, Other (non HMO)

## 2013-03-05 LAB — BASIC METABOLIC PANEL
BUN: 36 mg/dL — ABNORMAL HIGH (ref 6–23)
CO2: 21 mEq/L (ref 19–32)
Calcium: 8.4 mg/dL (ref 8.4–10.5)
Chloride: 105 mEq/L (ref 96–112)
Creatinine, Ser: 1.6 mg/dL — ABNORMAL HIGH (ref 0.50–1.10)
GFR calc Af Amer: 40 mL/min — ABNORMAL LOW (ref >90)
GFR calc non Af Amer: 34 mL/min — ABNORMAL LOW (ref >90)
Glucose, Bld: 180 mg/dL — ABNORMAL HIGH (ref 70–99)
Potassium: 5.7 mEq/L — ABNORMAL HIGH (ref 3.5–5.1)
Sodium: 132 mEq/L — ABNORMAL LOW (ref 135–145)

## 2013-03-05 LAB — CBC
HCT: 26.9 % — ABNORMAL LOW (ref 36.0–46.0)
Hemoglobin: 8.8 g/dL — ABNORMAL LOW (ref 12.0–15.0)
MCH: 28.9 pg (ref 26.0–34.0)
MCHC: 32.7 g/dL (ref 30.0–36.0)
MCV: 88.2 fL (ref 78.0–100.0)
Platelets: 199 10*3/uL (ref 150–400)
RBC: 3.05 MIL/uL — ABNORMAL LOW (ref 3.87–5.11)
RDW: 13.3 % (ref 11.5–15.5)
WBC: 7.5 10*3/uL (ref 4.0–10.5)

## 2013-03-05 MED ORDER — MORPHINE SULFATE 4 MG/ML IJ SOLN
6.0000 mg | Freq: Once | INTRAMUSCULAR | Status: AC
  Administered 2013-03-05: 6 mg via INTRAVENOUS
  Filled 2013-03-05: qty 2

## 2013-03-05 MED ORDER — DIAZEPAM 5 MG PO TABS: 5.0000 mg | ORAL_TABLET | Freq: Four times a day (QID) | ORAL | Status: DC | PRN

## 2013-03-05 MED ORDER — OXYCODONE-ACETAMINOPHEN 5-325 MG PO TABS: 1.0000 | ORAL_TABLET | ORAL | Status: DC | PRN

## 2013-03-05 MED ORDER — DIAZEPAM 5 MG PO TABS
5.0000 mg | ORAL_TABLET | Freq: Once | ORAL | Status: AC
  Administered 2013-03-05: 5 mg via ORAL
  Filled 2013-03-05: qty 1

## 2013-06-16 ENCOUNTER — Other Ambulatory Visit: Payer: Self-pay | Admitting: Family Medicine

## 2013-06-16 DIAGNOSIS — R1013 Epigastric pain: Secondary | ICD-10-CM

## 2013-06-24 ENCOUNTER — Ambulatory Visit
Admission: RE | Admit: 2013-06-24 | Discharge: 2013-06-24 | Disposition: A | Discharge status: Home / Self Care | Payer: Managed Care, Other (non HMO) | Source: Ambulatory Visit | Attending: Family Medicine | Admitting: Family Medicine

## 2013-06-24 DIAGNOSIS — R1013 Epigastric pain: Secondary | ICD-10-CM

## 2013-07-04 ENCOUNTER — Encounter: Payer: Self-pay | Admitting: Hematology & Oncology

## 2013-07-22 ENCOUNTER — Telehealth: Payer: Self-pay | Admitting: Hematology & Oncology

## 2013-07-22 NOTE — Telephone Encounter (Signed)
Left vm w NEW PATIENT today to remind them of their appointment with Dr. Ennever. Also, advised them to bring all meds and insurance information. ° °

## 2013-07-23 ENCOUNTER — Other Ambulatory Visit (HOSPITAL_BASED_OUTPATIENT_CLINIC_OR_DEPARTMENT_OTHER): Payer: Managed Care, Other (non HMO) | Admitting: Lab

## 2013-07-23 ENCOUNTER — Ambulatory Visit (HOSPITAL_BASED_OUTPATIENT_CLINIC_OR_DEPARTMENT_OTHER): Payer: Managed Care, Other (non HMO)

## 2013-07-23 ENCOUNTER — Ambulatory Visit (HOSPITAL_BASED_OUTPATIENT_CLINIC_OR_DEPARTMENT_OTHER): Payer: Managed Care, Other (non HMO) | Admitting: Hematology & Oncology

## 2013-07-23 ENCOUNTER — Encounter: Payer: Self-pay | Admitting: Hematology & Oncology

## 2013-07-23 VITALS — BP 162/76 | HR 78 | Temp 98.6°F | Resp 14 | Ht 60.0 in | Wt 125.0 lb

## 2013-07-23 DIAGNOSIS — D649 Anemia, unspecified: Secondary | ICD-10-CM

## 2013-07-23 LAB — CBC WITH DIFFERENTIAL (CANCER CENTER ONLY)
BASO#: 0 10*3/uL (ref 0.0–0.2)
BASO%: 0.3 % (ref 0.0–2.0)
EOS%: 0.9 % (ref 0.0–7.0)
Eosinophils Absolute: 0.1 10*3/uL (ref 0.0–0.5)
HCT: 32.1 % — ABNORMAL LOW (ref 34.8–46.6)
HGB: 10.1 g/dL — ABNORMAL LOW (ref 11.6–15.9)
LYMPH#: 2.4 10*3/uL (ref 0.9–3.3)
LYMPH%: 31.1 % (ref 14.0–48.0)
MCH: 28.7 pg (ref 26.0–34.0)
MCHC: 31.5 g/dL — ABNORMAL LOW (ref 32.0–36.0)
MCV: 91 fL (ref 81–101)
MONO#: 0.5 10*3/uL (ref 0.1–0.9)
MONO%: 7 % (ref 0.0–13.0)
NEUT#: 4.7 10*3/uL (ref 1.5–6.5)
NEUT%: 60.7 % (ref 39.6–80.0)
Platelets: 277 10*3/uL (ref 145–400)
RBC: 3.52 10*6/uL — ABNORMAL LOW (ref 3.70–5.32)
RDW: 13.3 % (ref 11.1–15.7)
WBC: 7.8 10*3/uL (ref 3.9–10.0)

## 2013-07-23 LAB — CHCC SATELLITE - SMEAR

## 2013-07-23 NOTE — Progress Notes (Signed)
This office note has been dictated.

## 2013-07-24 LAB — IRON AND TIBC CHCC
%SAT: 17 % — ABNORMAL LOW (ref 21–57)
Iron: 57 ug/dL (ref 41–142)
TIBC: 341 ug/dL (ref 236–444)
UIBC: 285 ug/dL (ref 120–384)

## 2013-07-24 LAB — FERRITIN CHCC: Ferritin: 25 ng/ml (ref 9–269)

## 2013-07-24 NOTE — Progress Notes (Signed)
CC:   [name], M.D.  DIAGNOSES: 1. Normochromic normocytic anemia. 2. History of gastric bypass.  HISTORY OF PRESENT ILLNESS:  Kelly Soto is very charming 58 year old white female.  She is originally from [place].  She underwent gastric bypass in 2009.  Before she had a bypass, she weighed 250 pounds.  She looks incredible.  She has lost half that weight.  About 3 years ago, she had a gastric ulcer.  She was hospitalized for this.  She says she got 3 pints of blood.  She does have diabetes.  She has had diabetes for about 10 years.  She is on Glucovance for this.  She has been found to be anemic.  She sees Dr. [name].  Dr. Brigitte Pulse has been following her blood work over the past few years.  She had a lab work done most recently in December.  Her white cell count is 6.5, hemoglobin 9.4, hematocrit 29, platelet count was 220.  MCV was 89.  She had normal white cell differential.  She had ferritin of 28.  She does have, I think some mild renal insufficiency.  BUN 28, creatinine 1.2.  She had iron saturation studies with iron saturation of 22%.  A B12 level back in 2012 was over 1500.  [name] states she feels tired.  She does feel worn out.  She had a colonoscopy back in 2011.  This was pretty unremarkable. Again, she had the upper GI bleed, she thinks in 2011, and was found to have a ulcer at the gastric bypass site.  She has had no rashes.  There has been no joint issues.  She has had no fevers, sweats, or chills.  She is not a vegetarian.  She has had no change in bowel or bladder habits.  She has had no significant weight loss or weight gain recently.  She is kindly referred to the [facility] for an evaluation of this anemia.  Overall, her performance status is excellent.  PAST MEDICAL HISTORY:  Remarkable for: 1. Morbid obesity-status post gastric bypass in 2008. 2. Bleeding, gastric ulcer-2011. 3. Hypertension. 4.  Non-insulin dependent diabetes. 5. History of __________. 6. Migraine. 7. Depression.  ALLERGIES: 1. NONSTEROIDALS. 2. ACE INHIBITORS.  MEDICATIONS: 1. Baclofen 10 mg p.o. t.i.d. p.r.n. migraines. 2. Benicar/hydrochlorothiazide (40/25) 1 p.o. daily. 3. Vitamin B12 one p.o. daily. 4. Iron supplements 1 p.o. daily. 5. Flonase nasal spray, 1 spray daily. 6. Glucovance (2.5/500) 2 p.o. b.i.d. 7. Prilosec 20 mg p.o. b.i.d. 8. Phenergan 25 mg p.o. q.8 h. p.r.n. 9. Topamax 25-50 mg p.o. b.i.d.  SOCIAL HISTORY:  Negative for tobacco use.  There has been no alcohol use.  She has no obvious occupational exposures.  FAMILY HISTORY:  Pretty much unremarkable.  She says that I think a grandmother may have had a platelet disorder.  She says that her daughter is anemic.  REVIEW OF SYSTEMS:  No additional findings are noted on a 12-system review.  PHYSICAL EXAMINATION:  General:  This is a petite white female in no obvious distress.  Vital Signs:  Temperature of 98.6, pulse 78, respiratory rate 14, blood pressure 163/76, weight is 125 pounds.  Head and Neck:  Normocephalic, atraumatic skull.  There are no ocular or oral lesions.  There is no scleral icterus.  Conjunctivae are not pale.  She has no glossitis.  There is no adenopathy in the neck.  Thyroid is nonpalpable.  Lungs:  Clear bilaterally.  Cardiac:  Regular rate and rhythm  with a normal S1 and S2.  There are no murmurs, rubs, or bruits. Abdomen:  Soft.  She has good bowel sounds.  There is no palpable abdominal mass.  She has laparoscopy scar from the gastric bypass. There is no palpable hepatosplenomegaly.  Back:  No tenderness over the spine, ribs, or hips.  Extremities:  No clubbing, cyanosis, or edema. She has good range motion of her joints.  She has good strength in her arms and legs.  Skin:  No rashes, ecchymosis, or petechia. Neurological:  No focal neurological deficits.  LABORATORY STUDIES:  White cell count is 7.8,  hemoglobin 10.1, hematocrit 32.1, platelet count 277.  MCV is 91.  Her peripheral smear shows some microcytic red cells.  There is some mild anisocytosis.  I see no polychromasia.  There are no target cells. I see no nucleated red blood cells.  There is no rouleaux formation.  I see no schistocytes or spherocytes.  White cells appear normal in morphology and maturation.  There is no immature myeloid or lymphoid forms.  There are no hypersegmented polys.  She has no atypical lymphocytes.  Platelets are adequate in number and size.  IMPRESSION:  Kelly Soto is very charming 58 year old white female. She has mild normochromic anemia.  By her blood smear, one would have to think that this by some degree of iron deficiency.  I think the key to her situation is the gastric bypass.  She had this, I think 6 years ago.  I just do not think she really is going to absorb iron all that well.  She is on oral iron right now.  I would think that her iron still is going to be on the low side.  I think the other issue was Ms. Hoeppner's diabetes.  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  She has had diabetes for 10 years.  It is not unusual to see diabetics with a low erythropoietin level and thus a low ability to make red blood cells.  We will see what her reticulocyte count is.  I do not suspect any hematologic malignancy.  I do not suspect myelodysplasia.  I would think B12 deficiency would be highly unusual.  One would see hypersegmented polys and blood cells that would be a lot larger.  I do not suspect any type of hemolysis.  I would not think there is any infiltrate of bone marrow problem.  I do not believe that a bone marrow biopsy is going to be needed with Ms. Boesen.  We will see what her anemia studies come back as.  We will probably have to give her IV iron.  I suspect that she also may need erythropoietin at some point.  I had a very nice time  with her.  I reviewed her lab work with her.  I went over my recommendations.  I did give her a prayer blanket.  She was very thankful of this.  I told my staff that we would call her with her lab results.  We will then be able to figure out when we could get her back here and how best to try to help the anemia and hopefully help her feel better with less fatigue.  I spent a good hour or more with Ms. Dutil.  Again, she is very charming.  I answered all of her questions.    ______________________________ Volanda Napoleon, M.D. PRE/MEDQ  D:  07/23/2013  T:  07/24/2013  Job:  [id number] 

## 2013-07-25 LAB — HEMOGLOBINOPATHY EVALUATION
Hemoglobin Other: 0 %
Hgb A2 Quant: 2.3 % (ref 2.2–3.2)
Hgb A: 97.7 % (ref 96.8–97.8)
Hgb F Quant: 0 % (ref 0.0–2.0)
Hgb S Quant: 0 %

## 2013-07-25 LAB — COMPREHENSIVE METABOLIC PANEL
ALT: 15 U/L (ref 0–35)
AST: 14 U/L (ref 0–37)
Albumin: 4.5 g/dL (ref 3.5–5.2)
Alkaline Phosphatase: 48 U/L (ref 39–117)
BUN: 21 mg/dL (ref 6–23)
CO2: 22 mEq/L (ref 19–32)
Calcium: 9.5 mg/dL (ref 8.4–10.5)
Chloride: 106 mEq/L (ref 96–112)
Creatinine, Ser: 1.13 mg/dL — ABNORMAL HIGH (ref 0.50–1.10)
Glucose, Bld: 90 mg/dL (ref 70–99)
Potassium: 5.2 mEq/L (ref 3.5–5.3)
Sodium: 135 mEq/L (ref 135–145)
Total Bilirubin: 0.2 mg/dL (ref 0.2–1.2)
Total Protein: 7.3 g/dL (ref 6.0–8.3)

## 2013-07-25 LAB — PROTEIN ELECTROPHORESIS, SERUM, WITH REFLEX
Albumin ELP: 61.9 % (ref 55.8–66.1)
Alpha-1-Globulin: 3.7 % (ref 2.9–4.9)
Alpha-2-Globulin: 10.8 % (ref 7.1–11.8)
Beta 2: 3.8 % (ref 3.2–6.5)
Beta Globulin: 7 % (ref 4.7–7.2)
Gamma Globulin: 12.8 % (ref 11.1–18.8)
Total Protein, Serum Electrophoresis: 7.3 g/dL (ref 6.0–8.3)

## 2013-07-25 LAB — RETICULOCYTES (CHCC)
ABS Retic: 21.3 10*3/uL (ref 19.0–186.0)
RBC.: 3.55 MIL/uL — ABNORMAL LOW (ref 3.87–5.11)
Retic Ct Pct: 0.6 % (ref 0.4–2.3)

## 2013-07-25 LAB — ERYTHROPOIETIN: Erythropoietin: 8 m[IU]/mL (ref 2.6–18.5)

## 2013-07-25 LAB — LACTATE DEHYDROGENASE: LDH: 110 U/L (ref 94–250)

## 2013-08-04 ENCOUNTER — Telehealth: Payer: Self-pay | Admitting: Hematology & Oncology

## 2013-08-04 NOTE — Telephone Encounter (Signed)
Pt called wanting appointment. Her labs are not back yet she is aware we will call to follow up. RN aware

## 2013-08-18 ENCOUNTER — Telehealth: Payer: Self-pay | Admitting: Hematology & Oncology

## 2013-08-18 NOTE — Telephone Encounter (Signed)
Pt called said MD was suppose to call her about another appointment. Per RN I transferred call to voice mail

## 2013-08-20 ENCOUNTER — Telehealth: Payer: Self-pay | Admitting: Hematology & Oncology

## 2013-08-20 NOTE — Telephone Encounter (Signed)
Patient called and cx 08/21/13 apt due to weather and resch for 08/22/13

## 2013-08-21 ENCOUNTER — Ambulatory Visit: Payer: Managed Care, Other (non HMO)

## 2013-08-22 ENCOUNTER — Ambulatory Visit (HOSPITAL_BASED_OUTPATIENT_CLINIC_OR_DEPARTMENT_OTHER): Payer: Managed Care, Other (non HMO)

## 2013-08-22 VITALS — BP 155/79 | HR 71 | Temp 98.1°F | Resp 16

## 2013-08-22 DIAGNOSIS — D508 Other iron deficiency anemias: Secondary | ICD-10-CM

## 2013-08-22 DIAGNOSIS — Z9884 Bariatric surgery status: Secondary | ICD-10-CM | POA: Insufficient documentation

## 2013-08-22 DIAGNOSIS — D509 Iron deficiency anemia, unspecified: Secondary | ICD-10-CM | POA: Insufficient documentation

## 2013-08-22 DIAGNOSIS — K909 Intestinal malabsorption, unspecified: Secondary | ICD-10-CM

## 2013-08-22 DIAGNOSIS — D510 Vitamin B12 deficiency anemia due to intrinsic factor deficiency: Secondary | ICD-10-CM

## 2013-08-22 MED ORDER — METHYLPREDNISOLONE SODIUM SUCC 125 MG IJ SOLR
125.0000 mg | Freq: Once | INTRAMUSCULAR | Status: AC
  Administered 2013-08-22: 125 mg via INTRAVENOUS

## 2013-08-22 MED ORDER — SODIUM CHLORIDE 0.9 % IV SOLN
125.0000 mg | Freq: Once | INTRAVENOUS | Status: AC
  Filled 2013-08-22: qty 2

## 2013-08-22 MED ORDER — SODIUM CHLORIDE 0.9 % IV SOLN
1020.0000 mg | Freq: Once | INTRAVENOUS | Status: AC
  Administered 2013-08-22: 1020 mg via INTRAVENOUS
  Filled 2013-08-22: qty 34

## 2013-08-22 MED ORDER — FAMOTIDINE IN NACL 20-0.9 MG/50ML-% IV SOLN
20.0000 mg | Freq: Once | INTRAVENOUS | Status: AC
  Administered 2013-08-22: 20 mg via INTRAVENOUS

## 2013-08-22 MED ORDER — SODIUM CHLORIDE 0.9 % IV SOLN
Freq: Once | INTRAVENOUS | Status: AC
  Administered 2013-08-22: 11:00:00 via INTRAVENOUS

## 2013-08-22 NOTE — Progress Notes (Signed)
At 1205 Feraheme restarted and pt completed without problems'

## 2013-08-22 NOTE — Progress Notes (Signed)
Five minutes after starting Feraheme, pt c/o nausea.  IV stopped and normal saline hanging. Dr. Marin Olp present. Pepcid given per protocol and then Solu Medrol per protocol.  Pt's nausea stopped within 3 minutes of stopping the Feraheme.  See Vital Signs sheet.

## 2013-08-22 NOTE — Patient Instructions (Signed)

## 2013-09-24 ENCOUNTER — Ambulatory Visit (HOSPITAL_BASED_OUTPATIENT_CLINIC_OR_DEPARTMENT_OTHER): Payer: Managed Care, Other (non HMO) | Admitting: Hematology & Oncology

## 2013-09-24 ENCOUNTER — Telehealth: Payer: Self-pay | Admitting: Hematology & Oncology

## 2013-09-24 ENCOUNTER — Other Ambulatory Visit: Payer: Self-pay | Admitting: *Deleted

## 2013-09-24 ENCOUNTER — Other Ambulatory Visit (HOSPITAL_BASED_OUTPATIENT_CLINIC_OR_DEPARTMENT_OTHER): Payer: Managed Care, Other (non HMO) | Admitting: Lab

## 2013-09-24 ENCOUNTER — Encounter: Payer: Self-pay | Admitting: Hematology & Oncology

## 2013-09-24 VITALS — BP 133/63 | HR 74 | Temp 97.6°F | Resp 14 | Wt 127.0 lb

## 2013-09-24 DIAGNOSIS — N039 Chronic nephritic syndrome with unspecified morphologic changes: Secondary | ICD-10-CM

## 2013-09-24 DIAGNOSIS — D510 Vitamin B12 deficiency anemia due to intrinsic factor deficiency: Secondary | ICD-10-CM

## 2013-09-24 DIAGNOSIS — Z9884 Bariatric surgery status: Secondary | ICD-10-CM

## 2013-09-24 DIAGNOSIS — D509 Iron deficiency anemia, unspecified: Secondary | ICD-10-CM

## 2013-09-24 DIAGNOSIS — D631 Anemia in chronic kidney disease: Secondary | ICD-10-CM

## 2013-09-24 DIAGNOSIS — N189 Chronic kidney disease, unspecified: Secondary | ICD-10-CM

## 2013-09-24 HISTORY — DX: Chronic kidney disease, unspecified: N18.9

## 2013-09-24 HISTORY — DX: Anemia in chronic kidney disease: D63.1

## 2013-09-24 LAB — CBC WITH DIFFERENTIAL (CANCER CENTER ONLY)
BASO#: 0 10*3/uL (ref 0.0–0.2)
BASO%: 0.4 % (ref 0.0–2.0)
EOS%: 0.5 % (ref 0.0–7.0)
Eosinophils Absolute: 0.1 10*3/uL (ref 0.0–0.5)
HCT: 30 % — ABNORMAL LOW (ref 34.8–46.6)
HGB: 9.6 g/dL — ABNORMAL LOW (ref 11.6–15.9)
LYMPH#: 1.8 10*3/uL (ref 0.9–3.3)
LYMPH%: 19.1 % (ref 14.0–48.0)
MCH: 30.1 pg (ref 26.0–34.0)
MCHC: 32 g/dL (ref 32.0–36.0)
MCV: 94 fL (ref 81–101)
MONO#: 0.5 10*3/uL (ref 0.1–0.9)
MONO%: 5.6 % (ref 0.0–13.0)
NEUT#: 7 10*3/uL — ABNORMAL HIGH (ref 1.5–6.5)
NEUT%: 74.4 % (ref 39.6–80.0)
Platelets: 215 10*3/uL (ref 145–400)
RBC: 3.19 10*6/uL — ABNORMAL LOW (ref 3.70–5.32)
RDW: 13.3 % (ref 11.1–15.7)
WBC: 9.4 10*3/uL (ref 3.9–10.0)

## 2013-09-24 LAB — IRON AND TIBC CHCC
%SAT: 34 % (ref 21–57)
Iron: 88 ug/dL (ref 41–142)
TIBC: 258 ug/dL (ref 236–444)
UIBC: 169 ug/dL (ref 120–384)

## 2013-09-24 LAB — FERRITIN CHCC: Ferritin: 332 ng/ml — ABNORMAL HIGH (ref 9–269)

## 2013-09-24 LAB — RETICULOCYTES (CHCC)
ABS Retic: 31.5 10*3/uL (ref 19.0–186.0)
RBC.: 3.15 MIL/uL — ABNORMAL LOW (ref 3.87–5.11)
Retic Ct Pct: 1 % (ref 0.4–2.3)

## 2013-09-24 LAB — VITAMIN B12: Vitamin B-12: 430 pg/mL (ref 211–911)

## 2013-09-24 LAB — CHCC SATELLITE - SMEAR

## 2013-09-24 NOTE — Telephone Encounter (Signed)
Faxed Medical Records via fax today  to:  [place] Ph: [phone] Fx: [phone]  Medical  Records requested labs   CONSENT COPY SCANNED

## 2013-09-24 NOTE — Progress Notes (Signed)
Hematology and Oncology Follow Up Visit  Kelly Soto [id number] 1955-11-18 58 y.o. 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 is today for followup. We first saw her, because she was iron deficient. Her ferritin was only in the 24. We went ahead and gave her IV iron. This did not help as much the thought would.  Her erythropoietin level is only 8. I think this is where the problem lies. She has long-standing diabetes. Because of this, she is not making erythropoietin. Her rash is still tired. She really does not have a lot of energy.  She's not having any problems with bowels or bladder. She's not having any leg swelling. There's been no rashes.. She's had no fever. There's been no cough.  Medications: Current outpatient prescriptions:BENICAR HCT 40-25 MG per tablet, Take 1 tablet by mouth daily., Disp: , Rfl: ;  fluticasone (FLONASE) 50 MCG/ACT nasal spray, Place 1 spray into the nose daily as needed. For nasal congestion, Disp: , Rfl: ;  glyBURIDE-metformin (GLUCOVANCE) 2.5-500 MG per tablet, Take 2 tablets by mouth 2 (two) times daily., Disp: , Rfl:  omeprazole (PRILOSEC) 20 MG capsule, Take 20 mg by mouth 2 (two) times daily before a meal. , Disp: , Rfl: ;  Pediatric Multiple Vit-C-FA (FLINSTONES GUMMIES OMEGA-3 DHA PO), Take 2 tablets by mouth daily., Disp: , Rfl: ;  promethazine (PHENERGAN) 25 MG tablet, Take 25-50 mg by mouth every 8 (eight) hours as needed (for migraine induced nausea)., Disp: , Rfl:  topiramate (TOPAMAX) 25 MG tablet, Take 25-50 mg by mouth 2 (two) times daily. 50 mg in the morning and 25 mg at night, Disp: , Rfl:   Allergies:  Allergies  Allergen Reactions  . Nsaids Anaphylaxis  . Ace  Inhibitors     Past Medical History, Surgical history, Social history, and Family History were reviewed and updated.  Review of Systems: As above  Physical Exam:  weight is 127 lb (57.607 kg). Her oral temperature is 97.6 F (36.4 C). Her blood pressure is 133/63 and her pulse is 74. Her respiration is 14.   Petit white female. Head and neck exam shows no ocular or oral lesions. She has no palpable lymph nodes in her neck. Thyroid is nonpalpable. Lungs are clear. Cardiac exam regular in rhythm with no murmurs rubs or bruits. Abdomen is soft. Good bowel sounds. There is no fluid wave. There is no palpable hepatosplenomegaly. Back exam no tenderness over the spine ribs or hips. Extremities shows no clubbing cyanosis or edema. Has good range of motion of her joints. Skin exam no rashes ecchymosis or petechia. Neurological exam shows no focal neurological deficits.  Lab Results  Component Value Date   WBC 9.4 09/24/2013   HGB 9.6* 09/24/2013   HCT 30.0* 09/24/2013   MCV 94 09/24/2013   PLT 215 09/24/2013     Chemistry      Component Value Date/Time   NA 135 07/23/2013 1532   K 5.2 07/23/2013 1532   CL 106 07/23/2013 1532   CO2 22 07/23/2013 1532   BUN 21 07/23/2013 1532   CREATININE 1.13* 07/23/2013 1532      Component Value Date/Time   CALCIUM 9.5 07/23/2013 1532   ALKPHOS 48 07/23/2013  1532   AST 14 07/23/2013 1532   ALT 15 07/23/2013 1532   BILITOT 0.2 07/23/2013 1532      Blood smear shows normochromic and normocytic red blood cells. There were breakable microcytic cells. There is no rouleau formation. There are no target cells. White cells are normal in morphology maturation. She is no hypersegmented polys. I see no immature myeloid forms. Platelets are adequate number size.   Impression and Plan: Kelly Soto is 58 year old white female. She has had a gastric bypass.  We've given her IV iron. I may have to believe that her iron levels are okay now. Her MCV is elevated. Her blood smear  looks better.  The problem is that she has erythropoietin deficiency. I do not believe that she is going to make erythropoietin. I do not believe that her blood count will get better with the use of Aranesp.  I talked to her at length about this. I spent over a 30 minutes with her. I explained to her the problem. I told her that Aranesp would be the best way to get her blood count better and to make her feel better.. I went over the side effects of Aranesp with her. I explained to her issue with high blood pressure. I explained to her the issue with thromboembolic disease and strokes. I told her the risk would be less than 5%. She understands this. She wants to proceed.  Again, her erythropoietin level is very low. I just do not see any way that her blood count will get better unless we give her Aranesp to try to stimulate her bone marrow.  We will await insurance to approve the Aranesp. We will call her back when this happens.[place], MD 4/1/201510:49 AM

## 2013-09-25 ENCOUNTER — Telehealth: Payer: Self-pay | Admitting: *Deleted

## 2013-09-25 NOTE — Telephone Encounter (Signed)
Message copied by Rico Ala on Thu Sep 25, 2013  1:06 PM ------      Message from: Burney Gauze R      Created: Wed Sep 24, 2013  5:36 PM       Call - iron is [facility] better!!!! Vit B12 is ok!!  Film/video editor ------

## 2013-10-01 ENCOUNTER — Telehealth: Payer: Self-pay | Admitting: Hematology & Oncology

## 2013-10-01 NOTE — Telephone Encounter (Signed)
[employer] Josem Kaufmann Nbr: [id number] Status: Approved Dates: 10/01/2013-01/21/2014 CPT: [id number]

## 2013-10-01 NOTE — Telephone Encounter (Signed)
Left message with 4-9 appointment

## 2013-10-02 ENCOUNTER — Encounter: Payer: Self-pay | Admitting: Hematology & Oncology

## 2013-10-02 ENCOUNTER — Ambulatory Visit (HOSPITAL_BASED_OUTPATIENT_CLINIC_OR_DEPARTMENT_OTHER): Payer: Managed Care, Other (non HMO)

## 2013-10-02 VITALS — BP 167/82 | HR 68 | Temp 96.9°F | Resp 16

## 2013-10-02 DIAGNOSIS — N189 Chronic kidney disease, unspecified: Secondary | ICD-10-CM

## 2013-10-02 DIAGNOSIS — D631 Anemia in chronic kidney disease: Secondary | ICD-10-CM

## 2013-10-02 DIAGNOSIS — N039 Chronic nephritic syndrome with unspecified morphologic changes: Secondary | ICD-10-CM

## 2013-10-02 MED ORDER — DARBEPOETIN ALFA-POLYSORBATE 300 MCG/0.6ML IJ SOLN
INTRAMUSCULAR | Status: AC
  Filled 2013-10-02: qty 0.6

## 2013-10-02 MED ORDER — DARBEPOETIN ALFA-POLYSORBATE 300 MCG/0.6ML IJ SOLN
300.0000 ug | Freq: Once | INTRAMUSCULAR | Status: AC
  Administered 2013-10-02: 300 ug via SUBCUTANEOUS

## 2013-10-02 NOTE — Patient Instructions (Signed)
Darbepoetin Alfa injection What is this medicine? DARBEPOETIN ALFA (dar be POE e tin AL fa) helps your body make more red blood cells. It is used to treat anemia caused by chronic kidney failure and chemotherapy. This medicine may be used for other purposes; ask your health care provider or pharmacist if you have questions. COMMON BRAND NAME(S): Aranesp What should I tell my health care provider before I take this medicine? They need to know if you have any of these conditions: -blood clotting disorders or history of blood clots -cancer patient not on chemotherapy -cystic fibrosis -heart disease, such as angina, heart failure, or a history of a heart attack -hemoglobin level of 12 g/dL or greater -high blood pressure -low levels of folate, iron, or vitamin B12 -seizures -an unusual or allergic reaction to darbepoetin, erythropoietin, albumin, hamster proteins, latex, other medicines, foods, dyes, or preservatives -pregnant or trying to get pregnant -breast-feeding How should I use this medicine? This medicine is for injection into a vein or under the skin. It is usually given by a health care professional in a hospital or clinic setting. If you get this medicine at home, you will be taught how to prepare and give this medicine. Do not shake the solution before you withdraw a dose. Use exactly as directed. Take your medicine at regular intervals. Do not take your medicine more often than directed. It is important that you put your used needles and syringes in a special sharps container. Do not put them in a trash can. If you do not have a sharps container, call your pharmacist or healthcare provider to get one. Talk to your pediatrician regarding the use of this medicine in children. While this medicine may be used in children as young as 1 year for selected conditions, precautions do apply. Overdosage: If you think you have taken too much of this medicine contact a poison control center or  emergency room at once. NOTE: This medicine is only for you. Do not share this medicine with others. What if I miss a dose? If you miss a dose, take it as soon as you can. If it is almost time for your next dose, take only that dose. Do not take double or extra doses. What may interact with this medicine? Do not take this medicine with any of the following medications: -epoetin alfa This list may not describe all possible interactions. Give your health care provider a list of all the medicines, herbs, non-prescription drugs, or dietary supplements you use. Also tell them if you smoke, drink alcohol, or use illegal drugs. Some items may interact with your medicine. What should I watch for while using this medicine? Visit your prescriber or health care professional for regular checks on your progress and for the needed blood tests and blood pressure measurements. It is especially important for the doctor to make sure your hemoglobin level is in the desired range, to limit the risk of potential side effects and to give you the best benefit. Keep all appointments for any recommended tests. Check your blood pressure as directed. Ask your doctor what your blood pressure should be and when you should contact him or her. As your body makes more red blood cells, you may need to take iron, folic acid, or vitamin B supplements. Ask your doctor or health care provider which products are right for you. If you have kidney disease continue dietary restrictions, even though this medication can make you feel better. Talk with your doctor or health   care professional about the foods you eat and the vitamins that you take. What side effects may I notice from receiving this medicine? Side effects that you should report to your doctor or health care professional as soon as possible: -allergic reactions like skin rash, itching or hives, swelling of the face, lips, or tongue -breathing problems -changes in vision -chest  pain -confusion, trouble speaking or understanding -feeling faint or lightheaded, falls -high blood pressure -muscle aches or pains -pain, swelling, warmth in the leg -rapid weight gain -severe headaches -sudden numbness or weakness of the face, arm or leg -trouble walking, dizziness, loss of balance or coordination -seizures (convulsions) -swelling of the ankles, feet, hands -unusually weak or tired Side effects that usually do not require medical attention (report to your doctor or health care professional if they continue or are bothersome): -diarrhea -fever, chills (flu-like symptoms) -headaches -nausea, vomiting -redness, stinging, or swelling at site where injected This list may not describe all possible side effects. Call your doctor for medical advice about side effects. You may report side effects to FDA at 1-800-FDA-1088. Where should I keep my medicine? Keep out of the reach of children. Store in a refrigerator between 2 and 8 degrees C (36 and 46 degrees F). Do not freeze. Do not shake. Throw away any unused portion if using a single-dose vial. Throw away any unused medicine after the expiration date. NOTE: This sheet is a summary. It may not cover all possible information. If you have questions about this medicine, talk to your doctor, pharmacist, or health care provider.  2014, Elsevier/Gold Standard. (2008-05-26 10:23:57)  

## 2013-10-14 ENCOUNTER — Encounter: Payer: Self-pay | Admitting: Nurse Practitioner

## 2013-10-14 NOTE — Progress Notes (Signed)
Office notes faxed to [facility]. Faxed to [phone]

## 2013-11-13 ENCOUNTER — Encounter: Payer: Self-pay | Admitting: Hematology & Oncology

## 2013-11-13 ENCOUNTER — Ambulatory Visit (HOSPITAL_BASED_OUTPATIENT_CLINIC_OR_DEPARTMENT_OTHER): Payer: Managed Care, Other (non HMO) | Admitting: Hematology & Oncology

## 2013-11-13 ENCOUNTER — Other Ambulatory Visit (HOSPITAL_BASED_OUTPATIENT_CLINIC_OR_DEPARTMENT_OTHER): Payer: Managed Care, Other (non HMO) | Admitting: Lab

## 2013-11-13 ENCOUNTER — Ambulatory Visit: Payer: Managed Care, Other (non HMO)

## 2013-11-13 VITALS — BP 154/68 | HR 67 | Temp 97.9°F | Resp 14 | Ht 61.0 in | Wt 130.0 lb

## 2013-11-13 DIAGNOSIS — N189 Chronic kidney disease, unspecified: Secondary | ICD-10-CM

## 2013-11-13 DIAGNOSIS — D649 Anemia, unspecified: Secondary | ICD-10-CM

## 2013-11-13 DIAGNOSIS — D509 Iron deficiency anemia, unspecified: Secondary | ICD-10-CM

## 2013-11-13 DIAGNOSIS — D631 Anemia in chronic kidney disease: Secondary | ICD-10-CM

## 2013-11-13 LAB — CBC WITH DIFFERENTIAL (CANCER CENTER ONLY)
BASO#: 0 10*3/uL (ref 0.0–0.2)
BASO%: 0.5 % (ref 0.0–2.0)
EOS%: 1 % (ref 0.0–7.0)
Eosinophils Absolute: 0.1 10*3/uL (ref 0.0–0.5)
HCT: 39.3 % (ref 34.8–46.6)
HGB: 12.4 g/dL (ref 11.6–15.9)
LYMPH#: 1.8 10*3/uL (ref 0.9–3.3)
LYMPH%: 29.8 % (ref 14.0–48.0)
MCH: 30.5 pg (ref 26.0–34.0)
MCHC: 31.6 g/dL — ABNORMAL LOW (ref 32.0–36.0)
MCV: 97 fL (ref 81–101)
MONO#: 0.4 10*3/uL (ref 0.1–0.9)
MONO%: 6.3 % (ref 0.0–13.0)
NEUT#: 3.9 10*3/uL (ref 1.5–6.5)
NEUT%: 62.4 % (ref 39.6–80.0)
Platelets: 196 10*3/uL (ref 145–400)
RBC: 4.07 10*6/uL (ref 3.70–5.32)
RDW: 13.1 % (ref 11.1–15.7)
WBC: 6.2 10*3/uL (ref 3.9–10.0)

## 2013-11-13 LAB — IRON AND TIBC CHCC
%SAT: 44 % (ref 21–57)
Iron: 114 ug/dL (ref 41–142)
TIBC: 261 ug/dL (ref 236–444)
UIBC: 147 ug/dL (ref 120–384)

## 2013-11-13 LAB — RETICULOCYTES (CHCC)
ABS Retic: 12.4 10*3/uL — ABNORMAL LOW (ref 19.0–186.0)
RBC.: 4.13 MIL/uL (ref 3.87–5.11)
Retic Ct Pct: 0.3 % — ABNORMAL LOW (ref 0.4–2.3)

## 2013-11-13 LAB — FERRITIN CHCC: Ferritin: 123 ng/ml (ref 9–269)

## 2013-11-13 LAB — CHCC SATELLITE - SMEAR

## 2013-11-13 NOTE — Progress Notes (Signed)
Hematology and Oncology Follow Up Visit  Kelly Soto [id number] January 05, 1956 58 y.o. 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 is back for followup. She's doing much better now. Refined to give her a dose of Aranesp. This was back in early April. Her erythropoietin level was only 8. I knew that was she got Aranesp, this would help. She feels more energetic. She's able to handle the stress at work. She is to watch her blood sugars closely. She is now taking subcutaneous medication for osteoporosis.  We last saw her back in April, her ferritin was 332. Her iron saturation 34%.. She's had no bleeding. She's had no change in bowel or bladder habits. There's been no cough.  Medications: Current outpatient prescriptions:baclofen (LIORESAL) 10 MG tablet, Take 10 mg by mouth as needed. migraines, Disp: , Rfl: ;  BENICAR HCT 40-25 MG per tablet, Take 1 tablet by mouth daily., Disp: , Rfl: ;  Calcium Carbonate-Vitamin D (CALCIUM 500/VITAMIN D PO), Take by mouth 2 (two) times daily. GUMMIES, Disp: , Rfl: ;  fluticasone (FLONASE) 50 MCG/ACT nasal spray, Place 1 spray into the nose daily as needed. For nasal congestion, Disp: , Rfl:  glyBURIDE-metformin (GLUCOVANCE) 2.5-500 MG per tablet, Take 2 tablets by mouth 2 (two) times daily., Disp: , Rfl: ;  omeprazole (PRILOSEC) 20 MG capsule, Take 20 mg by mouth 2 (two) times daily before a meal. , Disp: , Rfl: ;  Pediatric Multiple Vit-C-FA (FLINSTONES GUMMIES OMEGA-3 DHA PO), Take 2 tablets by mouth daily., Disp: , Rfl:  promethazine (PHENERGAN) 25 MG tablet, Take 25-50 mg by mouth every 8 (eight) hours as needed (for migraine induced nausea)., Disp: , Rfl: ;  topiramate (TOPAMAX) 25 MG tablet,  Take 25-50 mg by mouth 2 (two) times daily. 50 mg in the morning and 25 mg at night, Disp: , Rfl:   Allergies:  Allergies  Allergen Reactions  . Nsaids Anaphylaxis  . Ace Inhibitors     Past Medical History, Surgical history, Social history, and Family History were reviewed and updated.  Review of Systems: As above  Physical Exam:  height is 5\' 1"  (1.549 m) and weight is 130 lb (58.968 kg). Her oral temperature is 97.9 F (36.6 C). Her blood pressure is 154/68 and her pulse is 67. Her respiration is 14.   Petite white female. Lungs are clear. Cardiac exam regular in rhythm. Abdomen soft tissues good bowel sounds. There is no fluid wave. Is a palpable liver or spleen tip. Neck exam no tenderness over the spine ribs or hips. Extremities shows no clubbing cyanosis or edema. Neurological exam is nonfocal.  Lab Results  Component Value Date   WBC 6.2 11/13/2013   HGB 12.4 11/13/2013   HCT 39.3 11/13/2013   MCV 97 11/13/2013   PLT 196 11/13/2013     Chemistry      Component Value Date/Time   NA 135 07/23/2013 1532   K 5.2 07/23/2013 1532   CL 106 07/23/2013 1532   CO2 22 07/23/2013 1532   BUN 21 07/23/2013 1532   CREATININE 1.13* 07/23/2013 1532      Component Value Date/Time   CALCIUM 9.5 07/23/2013 1532   ALKPHOS 48 07/23/2013 1532  AST 14 07/23/2013 1532   ALT 15 07/23/2013 1532   BILITOT 0.2 07/23/2013 1532         Impression and Plan: Kelly Soto is 58 year old white female. She has multi-factorial anemia. We have resolved this. She's doing quite well right now.  Review palliative right now about 2 months or so.  Suspect that she probably will need Aranesp and iron in the future.  She will be going to [facility] in September. I want to make sure that we have her blood ready for this.   Volanda Napoleon, MD 5/21/20158:42 AM

## 2013-12-22 ENCOUNTER — Other Ambulatory Visit: Payer: Self-pay | Admitting: Family Medicine

## 2013-12-22 DIAGNOSIS — K769 Liver disease, unspecified: Secondary | ICD-10-CM

## 2013-12-30 ENCOUNTER — Telehealth: Payer: Self-pay | Admitting: Hematology & Oncology

## 2013-12-30 NOTE — Telephone Encounter (Signed)
[employer]  Josem Kaufmann Nbr: [id number] Status: Approved  Dates: 01/22/2014-05/25/2014  CPT: [id number]      [employer]  Auth Nbr: [id number]  Status: Approved  Dates: 10/01/2013-01/21/2014  CPT: [id number]

## 2014-01-05 ENCOUNTER — Ambulatory Visit
Admission: RE | Admit: 2014-01-05 | Discharge: 2014-01-05 | Disposition: A | Discharge status: Home / Self Care | Payer: Managed Care, Other (non HMO) | Source: Ambulatory Visit | Attending: Family Medicine | Admitting: Family Medicine

## 2014-01-05 ENCOUNTER — Other Ambulatory Visit: Payer: Self-pay | Admitting: Family Medicine

## 2014-01-05 DIAGNOSIS — K769 Liver disease, unspecified: Secondary | ICD-10-CM

## 2014-01-19 ENCOUNTER — Encounter: Payer: Self-pay | Admitting: Hematology & Oncology

## 2014-01-19 ENCOUNTER — Ambulatory Visit (HOSPITAL_BASED_OUTPATIENT_CLINIC_OR_DEPARTMENT_OTHER): Payer: Managed Care, Other (non HMO) | Admitting: Hematology & Oncology

## 2014-01-19 ENCOUNTER — Other Ambulatory Visit (HOSPITAL_BASED_OUTPATIENT_CLINIC_OR_DEPARTMENT_OTHER): Payer: Managed Care, Other (non HMO) | Admitting: Lab

## 2014-01-19 ENCOUNTER — Ambulatory Visit (HOSPITAL_BASED_OUTPATIENT_CLINIC_OR_DEPARTMENT_OTHER): Payer: Managed Care, Other (non HMO)

## 2014-01-19 ENCOUNTER — Telehealth: Payer: Self-pay | Admitting: Hematology & Oncology

## 2014-01-19 VITALS — BP 166/77 | HR 73 | Temp 98.3°F | Resp 14 | Ht 61.0 in | Wt 134.0 lb

## 2014-01-19 VITALS — BP 167/84 | HR 67 | Temp 98.1°F | Resp 12

## 2014-01-19 DIAGNOSIS — N039 Chronic nephritic syndrome with unspecified morphologic changes: Secondary | ICD-10-CM

## 2014-01-19 DIAGNOSIS — D631 Anemia in chronic kidney disease: Secondary | ICD-10-CM

## 2014-01-19 DIAGNOSIS — D509 Iron deficiency anemia, unspecified: Secondary | ICD-10-CM

## 2014-01-19 DIAGNOSIS — N189 Chronic kidney disease, unspecified: Secondary | ICD-10-CM

## 2014-01-19 DIAGNOSIS — K909 Intestinal malabsorption, unspecified: Secondary | ICD-10-CM

## 2014-01-19 LAB — RETICULOCYTES (CHCC)
ABS Retic: 28 10*3/uL (ref 19.0–186.0)
RBC.: 3.5 MIL/uL — ABNORMAL LOW (ref 3.87–5.11)
Retic Ct Pct: 0.8 % (ref 0.4–2.3)

## 2014-01-19 LAB — CBC WITH DIFFERENTIAL (CANCER CENTER ONLY)
BASO#: 0 10*3/uL (ref 0.0–0.2)
BASO%: 0.7 % (ref 0.0–2.0)
EOS%: 2.4 % (ref 0.0–7.0)
Eosinophils Absolute: 0.1 10*3/uL (ref 0.0–0.5)
HCT: 31.6 % — ABNORMAL LOW (ref 34.8–46.6)
HGB: 10.1 g/dL — ABNORMAL LOW (ref 11.6–15.9)
LYMPH#: 1.7 10*3/uL (ref 0.9–3.3)
LYMPH%: 28.1 % (ref 14.0–48.0)
MCH: 29.3 pg (ref 26.0–34.0)
MCHC: 32 g/dL (ref 32.0–36.0)
MCV: 92 fL (ref 81–101)
MONO#: 0.4 10*3/uL (ref 0.1–0.9)
MONO%: 7.3 % (ref 0.0–13.0)
NEUT#: 3.6 10*3/uL (ref 1.5–6.5)
NEUT%: 61.5 % (ref 39.6–80.0)
Platelets: 205 10*3/uL (ref 145–400)
RBC: 3.45 10*6/uL — ABNORMAL LOW (ref 3.70–5.32)
RDW: 13.1 % (ref 11.1–15.7)
WBC: 5.9 10*3/uL (ref 3.9–10.0)

## 2014-01-19 LAB — IRON AND TIBC CHCC
%SAT: 39 % (ref 21–57)
Iron: 110 ug/dL (ref 41–142)
TIBC: 285 ug/dL (ref 236–444)
UIBC: 175 ug/dL (ref 120–384)

## 2014-01-19 LAB — FERRITIN CHCC: Ferritin: 211 ng/ml (ref 9–269)

## 2014-01-19 MED ORDER — DARBEPOETIN ALFA-POLYSORBATE 300 MCG/0.6ML IJ SOLN
INTRAMUSCULAR | Status: AC
  Filled 2014-01-19: qty 0.6

## 2014-01-19 MED ORDER — METHYLPREDNISOLONE SODIUM SUCC 125 MG IJ SOLR
INTRAMUSCULAR | Status: AC
  Filled 2014-01-19: qty 2

## 2014-01-19 MED ORDER — FAMOTIDINE IN NACL 20-0.9 MG/50ML-% IV SOLN
INTRAVENOUS | Status: AC
  Filled 2014-01-19: qty 50

## 2014-01-19 MED ORDER — FAMOTIDINE IN NACL 20-0.9 MG/50ML-% IV SOLN
20.0000 mg | Freq: Two times a day (BID) | INTRAVENOUS | Status: DC
  Administered 2014-01-19: 20 mg via INTRAVENOUS

## 2014-01-19 MED ORDER — SODIUM CHLORIDE 0.9 % IV SOLN
1020.0000 mg | Freq: Once | INTRAVENOUS | Status: AC
  Administered 2014-01-19: 1020 mg via INTRAVENOUS
  Filled 2014-01-19: qty 34

## 2014-01-19 MED ORDER — METHYLPREDNISOLONE SODIUM SUCC 125 MG IJ SOLR
125.0000 mg | Freq: Once | INTRAMUSCULAR | Status: AC
  Administered 2014-01-19: 125 mg via INTRAVENOUS

## 2014-01-19 MED ORDER — DARBEPOETIN ALFA-POLYSORBATE 300 MCG/0.6ML IJ SOLN
300.0000 ug | Freq: Once | INTRAMUSCULAR | Status: AC
  Administered 2014-01-19: 300 ug via SUBCUTANEOUS

## 2014-01-19 NOTE — Progress Notes (Signed)
Hematology and Oncology Follow Up Visit  Kelly Soto [id number] 1956/05/10 58 y.o. 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 is a back for followup. Last saw back in May. She got an iron and Aranesp back in April. This help her out quite a bit. Her blood count went up to a hemoglobin 12.4. That it is now coming back down. Hemoglobin is 10.1 today. She feels a little more tired. Patient still working. There her blood sugars have been under decent control.  She will have her vacation in September. She going to the beach for 2 weeks. She's not noted any bleeding. She had no nausea vomiting. There's been no rashes. She's had no cough.  Medications: Current outpatient prescriptions:baclofen (LIORESAL) 10 MG tablet, Take 10 mg by mouth as needed. migraines, Disp: , Rfl: ;  BENICAR HCT 40-25 MG per tablet, Take 1 tablet by mouth daily., Disp: , Rfl: ;  Calcium Carbonate-Vitamin D (CALCIUM 500/VITAMIN D PO), Take by mouth 2 (two) times daily. GUMMIES, Disp: , Rfl: ;  fluticasone (FLONASE) 50 MCG/ACT nasal spray, Place 1 spray into the nose daily as needed. For nasal congestion, Disp: , Rfl:  glyBURIDE-metformin (GLUCOVANCE) 2.5-500 MG per tablet, Take 2 tablets by mouth 2 (two) times daily., Disp: , Rfl: ;  omeprazole (PRILOSEC) 20 MG capsule, Take 20 mg by mouth 2 (two) times daily before a meal. , Disp: , Rfl: ;  Pediatric Multiple Vit-C-FA (FLINSTONES GUMMIES OMEGA-3 DHA PO), Take 2 tablets by mouth daily., Disp: , Rfl:  promethazine (PHENERGAN) 25 MG tablet, Take 25-50 mg by mouth every 8 (eight) hours as needed (for migraine induced nausea)., Disp: , Rfl: ;  topiramate (TOPAMAX) 25 MG tablet, Take 25-50 mg by mouth 2 (two) times  daily. 50 mg in the morning and 25 mg at night, Disp: , Rfl:  No current facility-administered medications for this visit. Facility-Administered Medications Ordered in Other Visits: famotidine (PEPCID) IVPB 20 mg, 20 mg, Intravenous, Q12H, Volanda Napoleon, MD, 20 mg at 01/19/14 0920  Allergies:  Allergies  Allergen Reactions  . Nsaids Anaphylaxis  . Ace Inhibitors     Past Medical History, Surgical history, Social history, and Family History were reviewed and updated.  Review of Systems: As above  Physical Exam:  height is 5\' 1"  (1.549 m) and weight is 134 lb (60.782 kg). Her oral temperature is 98.3 F (36.8 C). Her blood pressure is 166/77 and her pulse is 73. Her respiration is 14.   Well-developed and well-nourished white female in no obvious distress. Head and neck exam shows no ocular or oral lesions. She is no palpable cervical or supraclavicular lymph nodes. Lungs are clear. Cardiac exam regular in rhythm with no murmurs rubs or bruits. Abdomen is soft. She is good bowel sounds. There is no fluid wave. There is no palpable liver or spleen tip. Back exam no tenderness over the spine ribs or hips. Extremities shows no clubbing cyanosis or edema. Skin exam no rashes. Neurological exam is nonfocal.  Lab Results  Component Value Date   WBC 5.9 01/19/2014   HGB 10.1* 01/19/2014   HCT 31.6* 01/19/2014   MCV 92 01/19/2014   PLT 205 01/19/2014  Chemistry      Component Value Date/Time   NA 135 07/23/2013 1532   K 5.2 07/23/2013 1532   CL 106 07/23/2013 1532   CO2 22 07/23/2013 1532   BUN 21 07/23/2013 1532   CREATININE 1.13* 07/23/2013 1532      Component Value Date/Time   CALCIUM 9.5 07/23/2013 1532   ALKPHOS 48 07/23/2013 1532   AST 14 07/23/2013 1532   ALT 15 07/23/2013 1532   BILITOT 0.2 07/23/2013 1532         Impression and Plan: Kelly Soto is 58 year old white female. She has diabetes. She has iron deficiency anemia. She has a very low erythropoietin level.  I  suspect that she probably will need both iron and Aranesp today.  I looked at her blood under the microscope. She did have some increasing microcytic red cells.  Her MCV is lower and thatis usually the indicator for low iron for Kelly Soto.  We will go and give her Feraheme at 1020 mg dose. I'll also give her a dose of Aranesp at 300 mcg.  We will plan to get her back to see [place] after her vacation. I suspect her blood counts should be up nice and high.Volanda Napoleon, MD 7/27/201511:10 AM

## 2014-01-19 NOTE — Telephone Encounter (Signed)
Auth: [id number] Valid: 10/01/2013 - 01/21/2014 [id number] PR DARBEPOETIN Adine Madura  Auth: [id number] Valid: 01/22/2014 - 05/25/2014 [id number] PR DARBEPOETIN ALFA, Kathrene Alu   [employer] - NPR J2930 PR METHYLPREDNISOLONE INJECTION  J3490 PR DRUGS UNCLASSIFIED INJECTION Q0138 PR FERUMOXYTOL, NON-ESRD  J7050 PR NORMAL SALINE SOLUTION INFUS [id number] PR INJECTION, FAMOTIDINE, 20 MG I spoke w Ulice Dash on 01/19/2014 Ref:  [id number] P: [id number]   Dx: Chronic renal insufficiency 585.9 585.9 Anemia of renal disease  H/O gastric bypass V45.86  Intestinal malabsorption 579.9  Anemia, iron deficiency 280.9  Pernicious anemia 281.0 Anemia of renal disease - Primary 285.21  Efc: 06/27/2007 to current  I also spoke w Lennette Bihari  @ [phone] - F: [phone] [employer] dedicated team dept

## 2014-01-19 NOTE — Patient Instructions (Signed)
Ferumoxytol injection What is this medicine? FERUMOXYTOL is an iron complex. Iron is used to make healthy red blood cells, which carry oxygen and nutrients throughout the body. This medicine is used to treat iron deficiency anemia in people with chronic kidney disease. This medicine may be used for other purposes; ask your health care provider or pharmacist if you have questions. COMMON BRAND NAME(S): Feraheme What should I tell my health care provider before I take this medicine? They need to know if you have any of these conditions: -anemia not caused by low iron levels -high levels of iron in the blood -magnetic resonance imaging (MRI) test scheduled -an unusual or allergic reaction to iron, other medicines, foods, dyes, or preservatives -pregnant or trying to get pregnant -breast-feeding How should I use this medicine? This medicine is for injection into a vein. It is given by a health care professional in a hospital or clinic setting. Talk to your pediatrician regarding the use of this medicine in children. Special care may be needed. Overdosage: If you think you've taken too much of this medicine contact a poison control center or emergency room at once. Overdosage: If you think you have taken too much of this medicine contact a poison control center or emergency room at once. NOTE: This medicine is only for you. Do not share this medicine with others. What if I miss a dose? It is important not to miss your dose. Call your doctor or health care professional if you are unable to keep an appointment. What may interact with this medicine? This medicine may interact with the following medications: -other iron products This list may not describe all possible interactions. Give your health care provider a list of all the medicines, herbs, non-prescription drugs, or dietary supplements you use. Also tell them if you smoke, drink alcohol, or use illegal drugs. Some items may interact with your  medicine. What should I watch for while using this medicine? Visit your doctor or healthcare professional regularly. Tell your doctor or healthcare professional if your symptoms do not start to get better or if they get worse. You may need blood work done while you are taking this medicine. You may need to follow a special diet. Talk to your doctor. Foods that contain iron include: whole grains/cereals, dried fruits, beans, or peas, leafy green vegetables, and organ meats (liver, kidney). What side effects may I notice from receiving this medicine? Side effects that you should report to your doctor or health care professional as soon as possible: -allergic reactions like skin rash, itching or hives, swelling of the face, lips, or tongue -breathing problems -changes in blood pressure -feeling faint or lightheaded, falls -fever or chills -flushing, sweating, or hot feelings -swelling of the ankles or feet Side effects that usually do not require medical attention (Report these to your doctor or health care professional if they continue or are bothersome.): -diarrhea -headache -nausea, vomiting -stomach pain This list may not describe all possible side effects. Call your doctor for medical advice about side effects. You may report side effects to FDA at 1-800-FDA-1088. Where should I keep my medicine? This drug is given in a hospital or clinic and will not be stored at home. NOTE: This sheet is a summary. It may not cover all possible information. If you have questions about this medicine, talk to your doctor, pharmacist, or health care provider.  2015, Elsevier/Gold Standard. (2012-01-26 15:23:36)   Darbepoetin Alfa injection What is this medicine? DARBEPOETIN ALFA (dar be POE   e tin AL fa) helps your body make more red blood cells. It is used to treat anemia caused by chronic kidney failure and chemotherapy. This medicine may be used for other purposes; ask your health care provider or  pharmacist if you have questions. COMMON BRAND NAME(S): Aranesp What should I tell my health care provider before I take this medicine? They need to know if you have any of these conditions: -blood clotting disorders or history of blood clots -cancer patient not on chemotherapy -cystic fibrosis -heart disease, such as angina, heart failure, or a history of a heart attack -hemoglobin level of 12 g/dL or greater -high blood pressure -low levels of folate, iron, or vitamin B12 -seizures -an unusual or allergic reaction to darbepoetin, erythropoietin, albumin, hamster proteins, latex, other medicines, foods, dyes, or preservatives -pregnant or trying to get pregnant -breast-feeding How should I use this medicine? This medicine is for injection into a vein or under the skin. It is usually given by a health care professional in a hospital or clinic setting. If you get this medicine at home, you will be taught how to prepare and give this medicine. Do not shake the solution before you withdraw a dose. Use exactly as directed. Take your medicine at regular intervals. Do not take your medicine more often than directed. It is important that you put your used needles and syringes in a special sharps container. Do not put them in a trash can. If you do not have a sharps container, call your pharmacist or healthcare provider to get one. Talk to your pediatrician regarding the use of this medicine in children. While this medicine may be used in children as young as 1 year for selected conditions, precautions do apply. Overdosage: If you think you have taken too much of this medicine contact a poison control center or emergency room at once. NOTE: This medicine is only for you. Do not share this medicine with others. What if I miss a dose? If you miss a dose, take it as soon as you can. If it is almost time for your next dose, take only that dose. Do not take double or extra doses. What may interact with  this medicine? Do not take this medicine with any of the following medications: -epoetin alfa This list may not describe all possible interactions. Give your health care provider a list of all the medicines, herbs, non-prescription drugs, or dietary supplements you use. Also tell them if you smoke, drink alcohol, or use illegal drugs. Some items may interact with your medicine. What should I watch for while using this medicine? Visit your prescriber or health care professional for regular checks on your progress and for the needed blood tests and blood pressure measurements. It is especially important for the doctor to make sure your hemoglobin level is in the desired range, to limit the risk of potential side effects and to give you the best benefit. Keep all appointments for any recommended tests. Check your blood pressure as directed. Ask your doctor what your blood pressure should be and when you should contact him or her. As your body makes more red blood cells, you may need to take iron, folic acid, or vitamin B supplements. Ask your doctor or health care provider which products are right for you. If you have kidney disease continue dietary restrictions, even though this medication can make you feel better. Talk with your doctor or health care professional about the foods you eat and the vitamins that   you take. What side effects may I notice from receiving this medicine? Side effects that you should report to your doctor or health care professional as soon as possible: -allergic reactions like skin rash, itching or hives, swelling of the face, lips, or tongue -breathing problems -changes in vision -chest pain -confusion, trouble speaking or understanding -feeling faint or lightheaded, falls -high blood pressure -muscle aches or pains -pain, swelling, warmth in the leg -rapid weight gain -severe headaches -sudden numbness or weakness of the face, arm or leg -trouble walking, dizziness, loss  of balance or coordination -seizures (convulsions) -swelling of the ankles, feet, hands -unusually weak or tired Side effects that usually do not require medical attention (report to your doctor or health care professional if they continue or are bothersome): -diarrhea -fever, chills (flu-like symptoms) -headaches -nausea, vomiting -redness, stinging, or swelling at site where injected This list may not describe all possible side effects. Call your doctor for medical advice about side effects. You may report side effects to FDA at 1-800-FDA-1088. Where should I keep my medicine? Keep out of the reach of children. Store in a refrigerator between 2 and 8 degrees C (36 and 46 degrees F). Do not freeze. Do not shake. Throw away any unused portion if using a single-dose vial. Throw away any unused medicine after the expiration date. NOTE: This sheet is a summary. It may not cover all possible information. If you have questions about this medicine, talk to your doctor, pharmacist, or health care provider.  2015, Elsevier/Gold Standard. (2008-05-26 10:23:57)  

## 2014-01-19 NOTE — Progress Notes (Signed)
Aranesp administered to pt per Dr Antonieta Pert instruction.

## 2014-03-05 ENCOUNTER — Telehealth: Payer: Self-pay | Admitting: Hematology & Oncology

## 2014-03-05 NOTE — Telephone Encounter (Signed)
Faxed Medical Records via fax today  to:  [facility] Ph: [phone] Fx: [phone]  Medical  Records requested from [place] to present   [place] SCANNED

## 2014-03-30 ENCOUNTER — Other Ambulatory Visit (HOSPITAL_BASED_OUTPATIENT_CLINIC_OR_DEPARTMENT_OTHER): Payer: Managed Care, Other (non HMO) | Admitting: Lab

## 2014-03-30 ENCOUNTER — Ambulatory Visit: Payer: Managed Care, Other (non HMO)

## 2014-03-30 ENCOUNTER — Ambulatory Visit (HOSPITAL_BASED_OUTPATIENT_CLINIC_OR_DEPARTMENT_OTHER): Payer: Managed Care, Other (non HMO)

## 2014-03-30 ENCOUNTER — Ambulatory Visit (HOSPITAL_BASED_OUTPATIENT_CLINIC_OR_DEPARTMENT_OTHER): Payer: Managed Care, Other (non HMO) | Admitting: Hematology & Oncology

## 2014-03-30 VITALS — BP 158/83 | HR 101 | Temp 98.0°F | Resp 16 | Wt 132.0 lb

## 2014-03-30 DIAGNOSIS — D509 Iron deficiency anemia, unspecified: Secondary | ICD-10-CM

## 2014-03-30 DIAGNOSIS — K909 Intestinal malabsorption, unspecified: Secondary | ICD-10-CM

## 2014-03-30 DIAGNOSIS — D631 Anemia in chronic kidney disease: Secondary | ICD-10-CM

## 2014-03-30 DIAGNOSIS — E119 Type 2 diabetes mellitus without complications: Secondary | ICD-10-CM

## 2014-03-30 DIAGNOSIS — N189 Chronic kidney disease, unspecified: Secondary | ICD-10-CM

## 2014-03-30 DIAGNOSIS — N183 Chronic kidney disease, stage 3 unspecified: Secondary | ICD-10-CM

## 2014-03-30 LAB — CBC WITH DIFFERENTIAL (CANCER CENTER ONLY)
BASO#: 0 10*3/uL (ref 0.0–0.2)
BASO%: 0.4 % (ref 0.0–2.0)
EOS%: 1 % (ref 0.0–7.0)
Eosinophils Absolute: 0.1 10*3/uL (ref 0.0–0.5)
HCT: 34.5 % — ABNORMAL LOW (ref 34.8–46.6)
HGB: 11.3 g/dL — ABNORMAL LOW (ref 11.6–15.9)
LYMPH#: 1.7 10*3/uL (ref 0.9–3.3)
LYMPH%: 19.9 % (ref 14.0–48.0)
MCH: 31.1 pg (ref 26.0–34.0)
MCHC: 32.8 g/dL (ref 32.0–36.0)
MCV: 95 fL (ref 81–101)
MONO#: 0.5 10*3/uL (ref 0.1–0.9)
MONO%: 6.4 % (ref 0.0–13.0)
NEUT#: 6 10*3/uL (ref 1.5–6.5)
NEUT%: 72.3 % (ref 39.6–80.0)
Platelets: 185 10*3/uL (ref 145–400)
RBC: 3.63 10*6/uL — ABNORMAL LOW (ref 3.70–5.32)
RDW: 13.1 % (ref 11.1–15.7)
WBC: 8.3 10*3/uL (ref 3.9–10.0)

## 2014-03-30 LAB — FERRITIN CHCC: Ferritin: 384 ng/ml — ABNORMAL HIGH (ref 9–269)

## 2014-03-30 LAB — IRON AND TIBC CHCC
%SAT: 49 % (ref 21–57)
Iron: 115 ug/dL (ref 41–142)
TIBC: 234 ug/dL — ABNORMAL LOW (ref 236–444)
UIBC: 119 ug/dL — ABNORMAL LOW (ref 120–384)

## 2014-03-30 LAB — CHCC SATELLITE - SMEAR

## 2014-03-30 LAB — RETICULOCYTES (CHCC)
ABS Retic: 18.7 10*3/uL — ABNORMAL LOW (ref 19.0–186.0)
RBC.: 3.73 MIL/uL — ABNORMAL LOW (ref 3.87–5.11)
Retic Ct Pct: 0.5 % (ref 0.4–2.3)

## 2014-03-30 MED ORDER — SODIUM CHLORIDE 0.9 % IV SOLN
1020.0000 mg | Freq: Once | INTRAVENOUS | Status: AC
  Administered 2014-03-30: 1020 mg via INTRAVENOUS
  Filled 2014-03-30: qty 34

## 2014-03-30 MED ORDER — METHYLPREDNISOLONE SODIUM SUCC 125 MG IJ SOLR: 125.0000 mg | Freq: Once | INTRAMUSCULAR | Status: DC

## 2014-03-30 MED ORDER — METHYLPREDNISOLONE SODIUM SUCC 125 MG IJ SOLR
INTRAMUSCULAR | Status: AC
  Filled 2014-03-30: qty 2

## 2014-03-30 MED ORDER — SODIUM CHLORIDE 0.9 % IV SOLN
Freq: Once | INTRAVENOUS | Status: AC
  Administered 2014-03-30: 09:00:00 via INTRAVENOUS

## 2014-03-30 MED ORDER — METHYLPREDNISOLONE SODIUM SUCC 125 MG IJ SOLR
125.0000 mg | Freq: Once | INTRAMUSCULAR | Status: AC
  Administered 2014-03-30: 125 mg via INTRAVENOUS

## 2014-03-30 NOTE — Patient Instructions (Signed)

## 2014-03-30 NOTE — Progress Notes (Signed)
Hematology and Oncology Follow Up Visit  Kelly Soto [id number] 07-Jan-1956 58 y.o. 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 is a back for followup.  We last saw her back in July. At that point on, hemoglobin was down to 10.3. We went ahead and gave her some iron. We'll scirrhous him Aranesp. This helped her quite a bit.   She had a great vacation in September. She going to the beach for 2 weeks. She has no down in [facility]. That really wonderful time. There were there before already came.  Her blood sugars have been doing pretty good. She really has had no problems with the diabetes. She's not noted any bleeding. She had no nausea vomiting. There's been no rashes. She's had no cough.  Medications: Current outpatient prescriptions:denosumab (PROLIA) 60 MG/ML SOLN injection, Inject 60 mg into the skin every 6 (six) months. Administer in upper arm, thigh, or abdomen, Disp: , Rfl: ;  baclofen (LIORESAL) 10 MG tablet, Take 10 mg by mouth as needed. migraines, Disp: , Rfl: ;  BENICAR HCT 40-25 MG per tablet, Take 1 tablet by mouth daily., Disp: , Rfl:  Calcium Carbonate-Vitamin D (CALCIUM 500/VITAMIN D PO), Take by mouth 2 (two) times daily. GUMMIES, Disp: , Rfl: ;  fluticasone (FLONASE) 50 MCG/ACT nasal spray, Place 1 spray into the nose daily as needed. For nasal congestion, Disp: , Rfl: ;  glyBURIDE-metformin (GLUCOVANCE) 2.5-500 MG per tablet, Take 2 tablets by mouth 2 (two) times daily., Disp: , Rfl:  omeprazole (PRILOSEC) 20 MG capsule, Take 20 mg by mouth 2 (two) times daily before a meal. , Disp: , Rfl: ;  Pediatric Multiple Vit-C-FA (FLINSTONES GUMMIES OMEGA-3 DHA PO), Take 2 tablets by mouth daily., Disp: ,  Rfl: ;  promethazine (PHENERGAN) 25 MG tablet, Take 25-50 mg by mouth every 8 (eight) hours as needed (for migraine induced nausea)., Disp: , Rfl:  topiramate (TOPAMAX) 25 MG tablet, Take 25-50 mg by mouth 2 (two) times daily. 50 mg in the morning and 25 mg at night, Disp: , Rfl:  No current facility-administered medications for this visit. Facility-Administered Medications Ordered in Other Visits: ferumoxytol (FERAHEME) 1,020 mg in sodium chloride 0.9 % 100 mL IVPB, 1,020 mg, Intravenous, Once, Volanda Napoleon, MD;  methylPREDNISolone sodium succinate (SOLU-MEDROL) 125 mg/2 mL injection 125 mg, 125 mg, Intravenous, Once, Volanda Napoleon, MD  Allergies:  Allergies  Allergen Reactions  . Nsaids Anaphylaxis  . Ace Inhibitors     Past Medical History, Surgical history, Social history, and Family History were reviewed and updated.  Review of Systems: As above  Physical Exam:  weight is 132 lb (59.875 kg). Her oral temperature is 98 F (36.7 C). Her blood pressure is 158/83 and her pulse is 101. Her respiration is 16.   Well-developed and well-nourished white female in no obvious distress. Head and neck exam shows no ocular or oral lesions. She is no palpable cervical or supraclavicular lymph nodes. Lungs are clear. Cardiac exam regular in rhythm with no murmurs rubs or bruits. Abdomen is soft. She is good bowel sounds. There is no fluid wave. There is no palpable liver or spleen tip. Back exam no tenderness over the spine  ribs or hips. Extremities shows no clubbing cyanosis or edema. Skin exam no rashes. Neurological exam is nonfocal.  Lab Results  Component Value Date   WBC 8.3 03/30/2014   HGB 11.3* 03/30/2014   HCT 34.5* 03/30/2014   MCV 95 03/30/2014   PLT 185 03/30/2014     Chemistry      Component Value Date/Time   NA 135 07/23/2013 1532   K 5.2 07/23/2013 1532   CL 106 07/23/2013 1532   CO2 22 07/23/2013 1532   BUN 21 07/23/2013 1532   CREATININE 1.13* 07/23/2013 1532      Component  Value Date/Time   CALCIUM 9.5 07/23/2013 1532   ALKPHOS 48 07/23/2013 1532   AST 14 07/23/2013 1532   ALT 15 07/23/2013 1532   BILITOT 0.2 07/23/2013 1532         Impression and Plan: Kelly Soto is 58 year old white female. She has diabetes. She has iron deficiency anemia. She has a very low erythropoietin level.  We will go ahead and give her some iron today. I spent there are might be on the low side.  She does not need any Aranesp. Her hemoglobin is about 11.  I think we can probably get her through the holidays now. We probably her back in January. She does issue was call us if she think she needs to have her lab work done.  [name]   Volanda Napoleon, MD 10/5/20159:00 AM

## 2014-05-24 ENCOUNTER — Encounter: Payer: Self-pay | Admitting: *Deleted

## 2014-07-01 ENCOUNTER — Other Ambulatory Visit (HOSPITAL_BASED_OUTPATIENT_CLINIC_OR_DEPARTMENT_OTHER): Payer: BLUE CROSS/BLUE SHIELD | Admitting: Lab

## 2014-07-01 ENCOUNTER — Ambulatory Visit (HOSPITAL_BASED_OUTPATIENT_CLINIC_OR_DEPARTMENT_OTHER): Payer: BLUE CROSS/BLUE SHIELD | Admitting: Hematology & Oncology

## 2014-07-01 ENCOUNTER — Encounter: Payer: Self-pay | Admitting: Hematology & Oncology

## 2014-07-01 VITALS — BP 141/72 | HR 79 | Temp 98.1°F | Resp 14 | Ht 61.0 in | Wt 131.0 lb

## 2014-07-01 DIAGNOSIS — N183 Chronic kidney disease, stage 3 unspecified: Secondary | ICD-10-CM

## 2014-07-01 DIAGNOSIS — N189 Chronic kidney disease, unspecified: Secondary | ICD-10-CM

## 2014-07-01 DIAGNOSIS — D631 Anemia in chronic kidney disease: Secondary | ICD-10-CM

## 2014-07-01 DIAGNOSIS — D509 Iron deficiency anemia, unspecified: Secondary | ICD-10-CM

## 2014-07-01 LAB — CBC WITH DIFFERENTIAL (CANCER CENTER ONLY)
BASO#: 0 10*3/uL (ref 0.0–0.2)
BASO%: 0.6 % (ref 0.0–2.0)
EOS%: 1.6 % (ref 0.0–7.0)
Eosinophils Absolute: 0.1 10*3/uL (ref 0.0–0.5)
HCT: 27.4 % — ABNORMAL LOW (ref 34.8–46.6)
HGB: 8.6 g/dL — ABNORMAL LOW (ref 11.6–15.9)
LYMPH#: 1.7 10*3/uL (ref 0.9–3.3)
LYMPH%: 24.7 % (ref 14.0–48.0)
MCH: 30.9 pg (ref 26.0–34.0)
MCHC: 31.4 g/dL — ABNORMAL LOW (ref 32.0–36.0)
MCV: 99 fL (ref 81–101)
MONO#: 0.5 10*3/uL (ref 0.1–0.9)
MONO%: 7.6 % (ref 0.0–13.0)
NEUT#: 4.4 10*3/uL (ref 1.5–6.5)
NEUT%: 65.5 % (ref 39.6–80.0)
Platelets: 238 10*3/uL (ref 145–400)
RBC: 2.78 10*6/uL — ABNORMAL LOW (ref 3.70–5.32)
RDW: 13.9 % (ref 11.1–15.7)
WBC: 6.7 10*3/uL (ref 3.9–10.0)

## 2014-07-01 LAB — CMP (CANCER CENTER ONLY)
ALT(SGPT): 20 U/L (ref 10–47)
AST: 16 U/L (ref 11–38)
Albumin: 3.8 g/dL (ref 3.3–5.5)
Alkaline Phosphatase: 38 U/L (ref 26–84)
BUN, Bld: 32 mg/dL — ABNORMAL HIGH (ref 7–22)
CO2: 20 mEq/L (ref 18–33)
Calcium: 9.5 mg/dL (ref 8.0–10.3)
Chloride: 106 mEq/L (ref 98–108)
Creat: 1.1 mg/dl (ref 0.6–1.2)
Glucose, Bld: 205 mg/dL — ABNORMAL HIGH (ref 73–118)
Potassium: 5.7 mEq/L — ABNORMAL HIGH (ref 3.3–4.7)
Sodium: 144 mEq/L (ref 128–145)
Total Bilirubin: 0.4 mg/dl (ref 0.20–1.60)
Total Protein: 7.4 g/dL (ref 6.4–8.1)

## 2014-07-01 LAB — IRON AND TIBC CHCC
%SAT: 40 % (ref 21–57)
Iron: 106 ug/dL (ref 41–142)
TIBC: 268 ug/dL (ref 236–444)
UIBC: 162 ug/dL (ref 120–384)

## 2014-07-01 LAB — RETICULOCYTES (CHCC)
ABS Retic: 73.1 10*3/uL (ref 19.0–186.0)
RBC.: 2.81 MIL/uL — ABNORMAL LOW (ref 3.87–5.11)
Retic Ct Pct: 2.6 % — ABNORMAL HIGH (ref 0.4–2.3)

## 2014-07-01 LAB — FERRITIN CHCC: Ferritin: 808 ng/ml — ABNORMAL HIGH (ref 9–269)

## 2014-07-01 NOTE — Progress Notes (Signed)
Hematology and Oncology Follow Up Visit  Kelly Soto [id number] 1956-06-17 59 y.o. 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 is a back for followup.  We last saw her back in October.. She was doing great. She has not required any injections or iron for several months.  She had a good Christmas and Thanksgiving. A lot of family came over. She really enjoyed herself.  She is working. Work has been quite stressful for her.  She does feel tired. She's been feeling more tired over the past several weeks. She thought that maybe it was all of the holiday activities.  She's had no problems with bleeding. Her blood sugars have been doing pretty well. She feels that they are under pretty good control.  She has had no problems with bowels or bladder. There's been no nausea or vomiting. She's had no leg swelling. She's had no rashes.   Medications: Current outpatient prescriptions: baclofen (LIORESAL) 10 MG tablet, Take 10 mg by mouth as needed. migraines, Disp: , Rfl: ;  BENICAR HCT 40-25 MG per tablet, Take 1 tablet by mouth daily., Disp: , Rfl: ;  Calcium Carbonate-Vitamin D (CALCIUM 500/VITAMIN D PO), Take by mouth 2 (two) times daily. GUMMIES, Disp: , Rfl:  denosumab (PROLIA) 60 MG/ML SOLN injection, Inject 60 mg into the skin every 6 (six) months. Administer in upper arm, thigh, or abdomen, Disp: , Rfl: ;  glyBURIDE-metformin (GLUCOVANCE) 2.5-500 MG per tablet, Take 2 tablets by mouth 2 (two) times daily., Disp: , Rfl: ;  omeprazole (PRILOSEC) 20 MG capsule, Take 20 mg by mouth 2 (two) times daily before a meal. , Disp: , Rfl:  Pediatric Multiple Vit-C-FA (FLINSTONES GUMMIES OMEGA-3 DHA PO), Take 2 tablets by mouth daily.,  Disp: , Rfl: ;  promethazine (PHENERGAN) 25 MG tablet, Take 25-50 mg by mouth every 8 (eight) hours as needed (for migraine induced nausea)., Disp: , Rfl: ;  topiramate (TOPAMAX) 25 MG tablet, Take 25-50 mg by mouth 2 (two) times daily. 50 mg in the morning and 25 mg at night, Disp: , Rfl:  fluticasone (FLONASE) 50 MCG/ACT nasal spray, Place 1 spray into the nose daily as needed. For nasal congestion, Disp: , Rfl:   Allergies:  Allergies  Allergen Reactions  . Nsaids Anaphylaxis  . Ace Inhibitors     Past Medical History, Surgical history, Social history, and Family History were reviewed and updated.  Review of Systems: As above  Physical Exam:  height is 5\' 1"  (1.549 m) and weight is 131 lb (59.421 kg). Her oral temperature is 98.1 F (36.7 C). Her blood pressure is 141/72 and her pulse is 79. Her respiration is 14.   Well-developed and well-nourished white female in no obvious distress. Head and neck exam shows no ocular or oral lesions. She is no palpable cervical or supraclavicular lymph nodes. Lungs are clear with no wheezes or rhonchi. Cardiac exam regular rate and rhythm with no murmurs rubs or bruits. Abdomen is soft. She is good bowel sounds. There is no fluid wave. There is no palpable liver or spleen tip. Back exam shows no tenderness over the spine ribs or hips. Extremities shows no clubbing cyanosis or edema. Skin exam shows no rashes.  Neurological exam is nonfocal.  Lab Results  Component Value Date   WBC 6.7 07/01/2014   HGB 8.6* 07/01/2014   HCT 27.4* 07/01/2014   MCV 99 07/01/2014   PLT 238 07/01/2014     Chemistry      Component Value Date/Time   NA 144 07/01/2014 0855   NA 135 07/23/2013 1532   K 5.7* 07/01/2014 0855   K 5.2 07/23/2013 1532   CL 106 07/01/2014 0855   CL 106 07/23/2013 1532   CO2 20 07/01/2014 0855   CO2 22 07/23/2013 1532   BUN 32* 07/01/2014 0855   BUN 21 07/23/2013 1532   CREATININE 1.1 07/01/2014 0855   CREATININE 1.13* 07/23/2013 1532       Component Value Date/Time   CALCIUM 9.5 07/01/2014 0855   CALCIUM 9.5 07/23/2013 1532   ALKPHOS 38 07/01/2014 0855   ALKPHOS 48 07/23/2013 1532   AST 16 07/01/2014 0855   AST 14 07/23/2013 1532   ALT 20 07/01/2014 0855   ALT 15 07/23/2013 1532   BILITOT 0.40 07/01/2014 0855   BILITOT 0.2 07/23/2013 1532         Impression and Plan: Kelly Soto is 59 year old white female. She has diabetes. She has iron deficiency anemia. She has a very low erythropoietin level.  Her blood count is incredibly low. I can't remember last time it was this low.  Her iron studies are marginal. I think she probably needs both Aranesp and iron. Unfortunately, we cannot do this today because of how busy we are. I will get her set up for this week.  I want to get her back in one month so we can follow-up with her. I want to make sure that we are aggressive with getting her hemoglobin back up. She has a very low erythropoietin level so I can understand that she could drop quickly.   I spent about 25 minutes with her today. I went over her labs area did I spent quite a bit time trying to get her set up for her injection and iron.   Volanda Napoleon, MD 1/6/20166:02 PM

## 2014-07-02 ENCOUNTER — Ambulatory Visit (HOSPITAL_BASED_OUTPATIENT_CLINIC_OR_DEPARTMENT_OTHER): Payer: BLUE CROSS/BLUE SHIELD

## 2014-07-02 VITALS — BP 153/68 | HR 79 | Temp 97.6°F | Resp 18

## 2014-07-02 DIAGNOSIS — D631 Anemia in chronic kidney disease: Secondary | ICD-10-CM

## 2014-07-02 DIAGNOSIS — N189 Chronic kidney disease, unspecified: Secondary | ICD-10-CM

## 2014-07-02 DIAGNOSIS — D509 Iron deficiency anemia, unspecified: Secondary | ICD-10-CM

## 2014-07-02 MED ORDER — DARBEPOETIN ALFA 300 MCG/0.6ML IJ SOSY
PREFILLED_SYRINGE | INTRAMUSCULAR | Status: AC
  Filled 2014-07-02: qty 0.6

## 2014-07-02 MED ORDER — SODIUM CHLORIDE 0.9 % IV SOLN
INTRAVENOUS | Status: DC
  Administered 2014-07-02: 09:00:00 via INTRAVENOUS

## 2014-07-02 MED ORDER — DARBEPOETIN ALFA 300 MCG/0.6ML IJ SOSY
300.0000 ug | PREFILLED_SYRINGE | Freq: Once | INTRAMUSCULAR | Status: AC
  Administered 2014-07-02: 300 ug via SUBCUTANEOUS

## 2014-07-02 MED ORDER — SODIUM CHLORIDE 0.9 % IV SOLN
510.0000 mg | Freq: Once | INTRAVENOUS | Status: AC
  Administered 2014-07-02: 510 mg via INTRAVENOUS
  Filled 2014-07-02: qty 17

## 2014-07-02 NOTE — Patient Instructions (Signed)
Darbepoetin Alfa injection What is this medicine? DARBEPOETIN ALFA (dar be POE e tin AL fa) helps your body make more red blood cells. It is used to treat anemia caused by chronic kidney failure and chemotherapy. This medicine may be used for other purposes; ask your health care provider or pharmacist if you have questions. COMMON BRAND NAME(S): Aranesp What should I tell my health care provider before I take this medicine? They need to know if you have any of these conditions: -blood clotting disorders or history of blood clots -cancer patient not on chemotherapy -cystic fibrosis -heart disease, such as angina, heart failure, or a history of a heart attack -hemoglobin level of 12 g/dL or greater -high blood pressure -low levels of folate, iron, or vitamin B12 -seizures -an unusual or allergic reaction to darbepoetin, erythropoietin, albumin, hamster proteins, latex, other medicines, foods, dyes, or preservatives -pregnant or trying to get pregnant -breast-feeding How should I use this medicine? This medicine is for injection into a vein or under the skin. It is usually given by a health care professional in a hospital or clinic setting. If you get this medicine at home, you will be taught how to prepare and give this medicine. Do not shake the solution before you withdraw a dose. Use exactly as directed. Take your medicine at regular intervals. Do not take your medicine more often than directed. It is important that you put your used needles and syringes in a special sharps container. Do not put them in a trash can. If you do not have a sharps container, call your pharmacist or healthcare provider to get one. Talk to your pediatrician regarding the use of this medicine in children. While this medicine may be used in children as young as 1 year for selected conditions, precautions do apply. Overdosage: If you think you have taken too much of this medicine contact a poison control center or  emergency room at once. NOTE: This medicine is only for you. Do not share this medicine with others. What if I miss a dose? If you miss a dose, take it as soon as you can. If it is almost time for your next dose, take only that dose. Do not take double or extra doses. What may interact with this medicine? Do not take this medicine with any of the following medications: -epoetin alfa This list may not describe all possible interactions. Give your health care provider a list of all the medicines, herbs, non-prescription drugs, or dietary supplements you use. Also tell them if you smoke, drink alcohol, or use illegal drugs. Some items may interact with your medicine. What should I watch for while using this medicine? Visit your prescriber or health care professional for regular checks on your progress and for the needed blood tests and blood pressure measurements. It is especially important for the doctor to make sure your hemoglobin level is in the desired range, to limit the risk of potential side effects and to give you the best benefit. Keep all appointments for any recommended tests. Check your blood pressure as directed. Ask your doctor what your blood pressure should be and when you should contact him or her. As your body makes more red blood cells, you may need to take iron, folic acid, or vitamin B supplements. Ask your doctor or health care provider which products are right for you. If you have kidney disease continue dietary restrictions, even though this medication can make you feel better. Talk with your doctor or health   care professional about the foods you eat and the vitamins that you take. What side effects may I notice from receiving this medicine? Side effects that you should report to your doctor or health care professional as soon as possible: -allergic reactions like skin rash, itching or hives, swelling of the face, lips, or tongue -breathing problems -changes in vision -chest  pain -confusion, trouble speaking or understanding -feeling faint or lightheaded, falls -high blood pressure -muscle aches or pains -pain, swelling, warmth in the leg -rapid weight gain -severe headaches -sudden numbness or weakness of the face, arm or leg -trouble walking, dizziness, loss of balance or coordination -seizures (convulsions) -swelling of the ankles, feet, hands -unusually weak or tired Side effects that usually do not require medical attention (report to your doctor or health care professional if they continue or are bothersome): -diarrhea -fever, chills (flu-like symptoms) -headaches -nausea, vomiting -redness, stinging, or swelling at site where injected This list may not describe all possible side effects. Call your doctor for medical advice about side effects. You may report side effects to FDA at 1-800-FDA-1088. Where should I keep my medicine? Keep out of the reach of children. Store in a refrigerator between 2 and 8 degrees C (36 and 46 degrees F). Do not freeze. Do not shake. Throw away any unused portion if using a single-dose vial. Throw away any unused medicine after the expiration date. NOTE: This sheet is a summary. It may not cover all possible information. If you have questions about this medicine, talk to your doctor, pharmacist, or health care provider.  2015, Elsevier/Gold Standard. (2008-05-26 10:23:57)  Ferumoxytol injection What is this medicine? FERUMOXYTOL is an iron complex. Iron is used to make healthy red blood cells, which carry oxygen and nutrients throughout the body. This medicine is used to treat iron deficiency anemia in people with chronic kidney disease. This medicine may be used for other purposes; ask your health care provider or pharmacist if you have questions. COMMON BRAND NAME(S): Feraheme What should I tell my health care provider before I take this medicine? They need to know if you have any of these conditions: -anemia not  caused by low iron levels -high levels of iron in the blood -magnetic resonance imaging (MRI) test scheduled -an unusual or allergic reaction to iron, other medicines, foods, dyes, or preservatives -pregnant or trying to get pregnant -breast-feeding How should I use this medicine? This medicine is for injection into a vein. It is given by a health care professional in a hospital or clinic setting. Talk to your pediatrician regarding the use of this medicine in children. Special care may be needed. Overdosage: If you think you've taken too much of this medicine contact a poison control center or emergency room at once. Overdosage: If you think you have taken too much of this medicine contact a poison control center or emergency room at once. NOTE: This medicine is only for you. Do not share this medicine with others. What if I miss a dose? It is important not to miss your dose. Call your doctor or health care professional if you are unable to keep an appointment. What may interact with this medicine? This medicine may interact with the following medications: -other iron products This list may not describe all possible interactions. Give your health care provider a list of all the medicines, herbs, non-prescription drugs, or dietary supplements you use. Also tell them if you smoke, drink alcohol, or use illegal drugs. Some items may interact with your   medicine. What should I watch for while using this medicine? Visit your doctor or healthcare professional regularly. Tell your doctor or healthcare professional if your symptoms do not start to get better or if they get worse. You may need blood work done while you are taking this medicine. You may need to follow a special diet. Talk to your doctor. Foods that contain iron include: whole grains/cereals, dried fruits, beans, or peas, leafy green vegetables, and organ meats (liver, kidney). What side effects may I notice from receiving this  medicine? Side effects that you should report to your doctor or health care professional as soon as possible: -allergic reactions like skin rash, itching or hives, swelling of the face, lips, or tongue -breathing problems -changes in blood pressure -feeling faint or lightheaded, falls -fever or chills -flushing, sweating, or hot feelings -swelling of the ankles or feet Side effects that usually do not require medical attention (Report these to your doctor or health care professional if they continue or are bothersome.): -diarrhea -headache -nausea, vomiting -stomach pain This list may not describe all possible side effects. Call your doctor for medical advice about side effects. You may report side effects to FDA at 1-800-FDA-1088. Where should I keep my medicine? This drug is given in a hospital or clinic and will not be stored at home. NOTE: This sheet is a summary. It may not cover all possible information. If you have questions about this medicine, talk to your doctor, pharmacist, or health care provider.  2015, Elsevier/Gold Standard. (2012-01-26 15:23:36)  

## 2014-07-27 ENCOUNTER — Ambulatory Visit: Payer: BLUE CROSS/BLUE SHIELD

## 2014-07-27 ENCOUNTER — Ambulatory Visit (HOSPITAL_BASED_OUTPATIENT_CLINIC_OR_DEPARTMENT_OTHER): Payer: BLUE CROSS/BLUE SHIELD | Admitting: Hematology & Oncology

## 2014-07-27 ENCOUNTER — Other Ambulatory Visit (HOSPITAL_BASED_OUTPATIENT_CLINIC_OR_DEPARTMENT_OTHER): Payer: BLUE CROSS/BLUE SHIELD | Admitting: Lab

## 2014-07-27 ENCOUNTER — Encounter: Payer: Self-pay | Admitting: Hematology & Oncology

## 2014-07-27 DIAGNOSIS — N189 Chronic kidney disease, unspecified: Secondary | ICD-10-CM

## 2014-07-27 DIAGNOSIS — D509 Iron deficiency anemia, unspecified: Secondary | ICD-10-CM

## 2014-07-27 DIAGNOSIS — D631 Anemia in chronic kidney disease: Secondary | ICD-10-CM

## 2014-07-27 LAB — CMP (CANCER CENTER ONLY)
ALT(SGPT): 13 U/L (ref 10–47)
AST: 16 U/L (ref 11–38)
Albumin: 3.8 g/dL (ref 3.3–5.5)
Alkaline Phosphatase: 41 U/L (ref 26–84)
BUN, Bld: 31 mg/dL — ABNORMAL HIGH (ref 7–22)
CO2: 20 mEq/L (ref 18–33)
Calcium: 9.6 mg/dL (ref 8.0–10.3)
Chloride: 105 mEq/L (ref 98–108)
Creat: 1.1 mg/dl (ref 0.6–1.2)
Glucose, Bld: 169 mg/dL — ABNORMAL HIGH (ref 73–118)
Potassium: 5.4 mEq/L — ABNORMAL HIGH (ref 3.3–4.7)
Sodium: 143 mEq/L (ref 128–145)
Total Bilirubin: 0.3 mg/dl (ref 0.20–1.60)
Total Protein: 7.3 g/dL (ref 6.4–8.1)

## 2014-07-27 LAB — CBC WITH DIFFERENTIAL (CANCER CENTER ONLY)
BASO#: 0.1 10*3/uL (ref 0.0–0.2)
BASO%: 0.7 % (ref 0.0–2.0)
EOS%: 1 % (ref 0.0–7.0)
Eosinophils Absolute: 0.1 10*3/uL (ref 0.0–0.5)
HCT: 42.8 % (ref 34.8–46.6)
HGB: 13.5 g/dL (ref 11.6–15.9)
LYMPH#: 1.4 10*3/uL (ref 0.9–3.3)
LYMPH%: 20 % (ref 14.0–48.0)
MCH: 32 pg (ref 26.0–34.0)
MCHC: 31.5 g/dL — ABNORMAL LOW (ref 32.0–36.0)
MCV: 101 fL (ref 81–101)
MONO#: 0.6 10*3/uL (ref 0.1–0.9)
MONO%: 8 % (ref 0.0–13.0)
NEUT#: 5 10*3/uL (ref 1.5–6.5)
NEUT%: 70.3 % (ref 39.6–80.0)
Platelets: 217 10*3/uL (ref 145–400)
RBC: 4.22 10*6/uL (ref 3.70–5.32)
RDW: 13.2 % (ref 11.1–15.7)
WBC: 7.2 10*3/uL (ref 3.9–10.0)

## 2014-07-27 LAB — RETICULOCYTES (CHCC)
ABS Retic: 34.7 10*3/uL (ref 19.0–186.0)
RBC.: 4.34 MIL/uL (ref 3.87–5.11)
Retic Ct Pct: 0.8 % (ref 0.4–2.3)

## 2014-07-27 NOTE — Progress Notes (Signed)
No injection needed per dr. Marin Olp

## 2014-07-27 NOTE — Progress Notes (Signed)
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 She is feeling much better. She had fereheme and an asanesp injection in January. Her Hgb is up to 13.5 MCV 10. She denies fever, chills, n/v, cough, rash, dizziness, SOB, chest pain, palpitations, abdominal pain, constipation, diarrhea, blood in urine or stool.  No swelling, tenderness, numbness or tingling in her extremities.  Her appetite is good and she is staying hydrated. Her weight is stable at 130 lbs.  She is still working and staying active.   CURRENT TREATMENT: IV iron as indicated Aranesp 300 mcg subcutaneous as needed for hemoglobin less than 11  REVIEW OF SYSTEMS: All other 10 point review of systems is negative.   PAST MEDICAL HISTORY: Past Medical History  Diagnosis Date  . Ulcer   . Chronic renal insufficiency 09/24/2013  . Anemia of renal disease 09/24/2013    PAST SURGICAL HISTORY: Past Surgical History  Procedure Laterality Date  . Abdominal hysterectomy  1992  . Gastric bypass  2009    FAMILY HISTORY History reviewed. No pertinent family history.  GYNECOLOGIC HISTORY:  No LMP recorded. Patient has had a hysterectomy.   SOCIAL HISTORY: History   Social History  . Marital Status: Married    Spouse Name: N/A    Number of Children: N/A  . Years of Education: N/A   Occupational History  . Not on file.   Social History Main Topics  . Smoking status: Never Smoker   . Smokeless tobacco: Never Used     Comment: never used tobacco  . Alcohol Use: No  . Drug Use: No  . Sexual Activity: Yes   Other Topics Concern  . Not on file   Social History  Narrative    ADVANCED DIRECTIVES:  <no information>  HEALTH MAINTENANCE: History  Substance Use Topics  . Smoking status: Never Smoker   . Smokeless tobacco: Never Used     Comment: never used tobacco  . Alcohol Use: No   Colonoscopy: PAP: Bone density: Lipid panel:  Allergies  Allergen Reactions  . Nsaids Anaphylaxis  . Ace Inhibitors     Current Outpatient Prescriptions  Medication Sig Dispense Refill  . baclofen (LIORESAL) 10 MG tablet Take 10 mg by mouth as needed. migraines    . BENICAR HCT 40-25 MG per tablet Take 1 tablet by mouth daily.    . Calcium Carbonate-Vitamin D (CALCIUM 500/VITAMIN D PO) Take by mouth 2 (two) times daily. GUMMIES    . denosumab (PROLIA) 60 MG/ML SOLN injection Inject 60 mg into the skin every 6 (six) months. Administer in upper arm, thigh, or abdomen    . omeprazole (PRILOSEC) 20 MG capsule Take 20 mg by mouth 2 (two) times daily before a meal.     . Pediatric Multiple Vit-C-FA (FLINSTONES GUMMIES OMEGA-3 DHA PO) Take 2 tablets by mouth daily.    . promethazine (PHENERGAN) 25 MG tablet Take 25-50 mg by mouth every 8 (eight) hours as needed (for migraine induced nausea).    . topiramate (TOPAMAX) 25 MG tablet Take 25-50 mg by mouth 2 (two) times daily. 50 mg in the morning and 25 mg at night    . glyBURIDE-metformin (GLUCOVANCE) 2.5-500 MG per tablet Take  2 tablets by mouth 2 (two) times daily.     No current facility-administered medications for this visit.    OBJECTIVE: Filed Vitals:   07/27/14 1212  BP: 151/75  Pulse: 89  Temp: 98 F (36.7 C)    Filed Weights   07/27/14 1212  Weight: 130 lb (58.968 kg)   ECOG FS:0 - Asymptomatic Ocular: Sclerae unicteric, pupils equal, round and reactive to light Ear-nose-throat: Oropharynx clear, dentition fair Lymphatic: No cervical or supraclavicular adenopathy Lungs no rales or rhonchi, good excursion bilaterally Heart regular rate and rhythm, no murmur appreciated Abd soft, nontender,  positive bowel sounds MSK no focal spinal tenderness, no joint edema Neuro: non-focal, well-oriented, appropriate affect Breasts: Deferred  LAB RESULTS: CMP     Component Value Date/Time   NA 143 07/27/2014 1150   NA 135 07/23/2013 1532   K 5.4* 07/27/2014 1150   K 5.2 07/23/2013 1532   CL 105 07/27/2014 1150   CL 106 07/23/2013 1532   CO2 20 07/27/2014 1150   CO2 22 07/23/2013 1532   GLUCOSE 169* 07/27/2014 1150   GLUCOSE 90 07/23/2013 1532   BUN 31* 07/27/2014 1150   BUN 21 07/23/2013 1532   CREATININE 1.1 07/27/2014 1150   CREATININE 1.13* 07/23/2013 1532   CALCIUM 9.6 07/27/2014 1150   CALCIUM 9.5 07/23/2013 1532   PROT 7.3 07/27/2014 1150   PROT 7.3 07/23/2013 1532   ALBUMIN 4.5 07/23/2013 1532   AST 16 07/27/2014 1150   AST 14 07/23/2013 1532   ALT 13 07/27/2014 1150   ALT 15 07/23/2013 1532   ALKPHOS 41 07/27/2014 1150   ALKPHOS 48 07/23/2013 1532   BILITOT 0.30 07/27/2014 1150   BILITOT 0.2 07/23/2013 1532   GFRNONAA 34* 03/05/2013 0222   GFRAA 40* 03/05/2013 0222   INo results found for: SPEP, UPEP Lab Results  Component Value Date   WBC 7.2 07/27/2014   NEUTROABS 5.0 07/27/2014   HGB 13.5 07/27/2014   HCT 42.8 07/27/2014   MCV 101 07/27/2014   PLT 217 07/27/2014   No results found for: LABCA2 No components found for: [id number] No results for input(s): INR in the last 168 hours.  STUDIES: No results found.  ASSESSMENT/PLAN: Kelly Soto is 59 year old white female with diabetes and iron deficiency anemia. She received Fereheme and Aranesp at her last visit.  Her Hgb today is now 13.5 and MCV 101.  We will see what her iron studies show.  We will see her back in 2 months for labs and follow-up.  She knows to call here with any questions or concerns and to go to the ED in the event of an emergency. We can certainly see her sooner if need be.    Eliezer Bottom, NP 07/27/2014 12:58 PM

## 2014-07-28 LAB — IRON AND TIBC CHCC
%SAT: 20 % — ABNORMAL LOW (ref 21–57)
Iron: 54 ug/dL (ref 41–142)
TIBC: 268 ug/dL (ref 236–444)
UIBC: 214 ug/dL (ref 120–384)

## 2014-07-28 LAB — FERRITIN CHCC: Ferritin: 572 ng/ml — ABNORMAL HIGH (ref 9–269)

## 2014-07-29 ENCOUNTER — Telehealth: Payer: Self-pay | Admitting: Hematology & Oncology

## 2014-07-29 ENCOUNTER — Telehealth: Payer: Self-pay | Admitting: *Deleted

## 2014-07-29 ENCOUNTER — Other Ambulatory Visit: Payer: Self-pay | Admitting: *Deleted

## 2014-07-29 DIAGNOSIS — D631 Anemia in chronic kidney disease: Secondary | ICD-10-CM

## 2014-07-29 DIAGNOSIS — N189 Chronic kidney disease, unspecified: Secondary | ICD-10-CM

## 2014-07-29 NOTE — Telephone Encounter (Addendum)
Message left for patient and message sent to scheduler.   ----- Message from Volanda Napoleon, MD sent at 07/29/2014  7:13 AM EST ----- Call - iron is low.  Please set up feraheme 510mg  x 1 dose.  pete

## 2014-07-29 NOTE — Telephone Encounter (Signed)
Left pt message to call for iron appointment

## 2014-07-30 ENCOUNTER — Telehealth: Payer: Self-pay | Admitting: Hematology & Oncology

## 2014-07-30 NOTE — Telephone Encounter (Signed)
Pt made 2-8 iron infusion. i sent Baxter Flattery inbasket to precert

## 2014-08-03 ENCOUNTER — Ambulatory Visit (HOSPITAL_BASED_OUTPATIENT_CLINIC_OR_DEPARTMENT_OTHER): Payer: BLUE CROSS/BLUE SHIELD

## 2014-08-03 VITALS — BP 150/73 | HR 70 | Temp 97.8°F | Resp 20

## 2014-08-03 DIAGNOSIS — D631 Anemia in chronic kidney disease: Secondary | ICD-10-CM

## 2014-08-03 DIAGNOSIS — D509 Iron deficiency anemia, unspecified: Secondary | ICD-10-CM

## 2014-08-03 DIAGNOSIS — N189 Chronic kidney disease, unspecified: Secondary | ICD-10-CM

## 2014-08-03 MED ORDER — SODIUM CHLORIDE 0.9 % IV SOLN
510.0000 mg | Freq: Once | INTRAVENOUS | Status: DC
  Administered 2014-08-03: 510 mg via INTRAVENOUS
  Filled 2014-08-03: qty 17

## 2014-08-03 NOTE — Patient Instructions (Signed)

## 2014-09-17 ENCOUNTER — Other Ambulatory Visit: Payer: Self-pay | Admitting: Family

## 2014-09-21 ENCOUNTER — Other Ambulatory Visit (HOSPITAL_BASED_OUTPATIENT_CLINIC_OR_DEPARTMENT_OTHER): Payer: BLUE CROSS/BLUE SHIELD

## 2014-09-21 ENCOUNTER — Encounter: Payer: Self-pay | Admitting: Family

## 2014-09-21 ENCOUNTER — Ambulatory Visit (HOSPITAL_BASED_OUTPATIENT_CLINIC_OR_DEPARTMENT_OTHER): Payer: BLUE CROSS/BLUE SHIELD | Admitting: Family

## 2014-09-21 ENCOUNTER — Ambulatory Visit (HOSPITAL_BASED_OUTPATIENT_CLINIC_OR_DEPARTMENT_OTHER): Payer: BLUE CROSS/BLUE SHIELD

## 2014-09-21 DIAGNOSIS — N189 Chronic kidney disease, unspecified: Secondary | ICD-10-CM

## 2014-09-21 DIAGNOSIS — D509 Iron deficiency anemia, unspecified: Secondary | ICD-10-CM

## 2014-09-21 DIAGNOSIS — Z9884 Bariatric surgery status: Secondary | ICD-10-CM

## 2014-09-21 DIAGNOSIS — D631 Anemia in chronic kidney disease: Secondary | ICD-10-CM

## 2014-09-21 DIAGNOSIS — E119 Type 2 diabetes mellitus without complications: Secondary | ICD-10-CM

## 2014-09-21 LAB — RETICULOCYTES (CHCC)
ABS Retic: 15.6 10*3/uL — ABNORMAL LOW (ref 19.0–186.0)
RBC.: 3.9 MIL/uL (ref 3.87–5.11)
Retic Ct Pct: 0.4 % (ref 0.4–2.3)

## 2014-09-21 LAB — CBC WITH DIFFERENTIAL (CANCER CENTER ONLY)
BASO#: 0 10*3/uL (ref 0.0–0.2)
BASO%: 0.3 % (ref 0.0–2.0)
EOS%: 1.8 % (ref 0.0–7.0)
Eosinophils Absolute: 0.1 10*3/uL (ref 0.0–0.5)
HCT: 36.1 % (ref 34.8–46.6)
HGB: 11.6 g/dL (ref 11.6–15.9)
LYMPH#: 1.5 10*3/uL (ref 0.9–3.3)
LYMPH%: 19.2 % (ref 14.0–48.0)
MCH: 30.3 pg (ref 26.0–34.0)
MCHC: 32.1 g/dL (ref 32.0–36.0)
MCV: 94 fL (ref 81–101)
MONO#: 0.5 10*3/uL (ref 0.1–0.9)
MONO%: 7 % (ref 0.0–13.0)
NEUT#: 5.5 10*3/uL (ref 1.5–6.5)
NEUT%: 71.7 % (ref 39.6–80.0)
Platelets: 198 10*3/uL (ref 145–400)
RBC: 3.83 10*6/uL (ref 3.70–5.32)
RDW: 12.6 % (ref 11.1–15.7)
WBC: 7.7 10*3/uL (ref 3.9–10.0)

## 2014-09-21 LAB — CMP (CANCER CENTER ONLY)
ALT(SGPT): 12 U/L (ref 10–47)
AST: 16 U/L (ref 11–38)
Albumin: 3.4 g/dL (ref 3.3–5.5)
Alkaline Phosphatase: 60 U/L (ref 26–84)
BUN, Bld: 34 mg/dL — ABNORMAL HIGH (ref 7–22)
CO2: 24 mEq/L (ref 18–33)
Calcium: 9.8 mg/dL (ref 8.0–10.3)
Chloride: 105 mEq/L (ref 98–108)
Creat: 1.2 mg/dl (ref 0.6–1.2)
Glucose, Bld: 261 mg/dL — ABNORMAL HIGH (ref 73–118)
Potassium: 5.8 mEq/L — ABNORMAL HIGH (ref 3.3–4.7)
Sodium: 144 mEq/L (ref 128–145)
Total Bilirubin: 0.4 mg/dl (ref 0.20–1.60)
Total Protein: 6.8 g/dL (ref 6.4–8.1)

## 2014-09-21 LAB — IRON AND TIBC CHCC
%SAT: 62 % — ABNORMAL HIGH (ref 21–57)
Iron: 129 ug/dL (ref 41–142)
TIBC: 209 ug/dL — ABNORMAL LOW (ref 236–444)
UIBC: 79 ug/dL — ABNORMAL LOW (ref 120–384)

## 2014-09-21 LAB — FERRITIN CHCC: Ferritin: 797 ng/ml — ABNORMAL HIGH (ref 9–269)

## 2014-09-21 MED ORDER — SODIUM CHLORIDE 0.9 % IV SOLN
510.0000 mg | Freq: Once | INTRAVENOUS | Status: DC
  Administered 2014-09-21: 510 mg via INTRAVENOUS
  Filled 2014-09-21: qty 17

## 2014-09-21 NOTE — Progress Notes (Signed)
Hematology and Oncology Follow Up Visit  Kelly Soto [id number] 1955-07-01 59 y.o. 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 She is feeling tired and wanting to sleep a lot.  She last had Fereheme and Aranesp in January. Her Hgb today is 11.6 and MCV 94. Her last iron saturation in February was 20% and MCV was 572.  She denies fever, chills, n/v, cough, dizziness, SOB, chest pain, palpitations, abdominal pain, constipation, diarrhea, blood in urine or stool. No episodes of bleeding. She is seeing a dermatologist for a rash she has on her arms and using a topical cream. She feels that it is slightly better.  Her blood sugars have been doing well.  No swelling, tenderness, numbness or tingling in her extremities.  Her appetite is good and she is staying hydrated. She is still working and trying to stay active. She a had great weekend with her family.   Medications:    Medication List       This list is accurate as of: 09/21/14 10:49 AM.  Always use your most recent med list.               baclofen 10 MG tablet  Commonly known as:  LIORESAL  Take 10 mg by mouth as needed. migraines     BENICAR HCT 40-25 MG per tablet  Generic drug:  olmesartan-hydrochlorothiazide  Take 1 tablet by mouth daily.     CALCIUM 500/VITAMIN D PO  Take by mouth 2 (two) times daily. GUMMIES     denosumab 60 MG/ML Soln injection  Commonly known as:  PROLIA  Inject 60 mg into the skin every 6 (six) months. Administer in upper arm, thigh, or abdomen     FLINSTONES GUMMIES OMEGA-3 DHA PO  Take 2 tablets by mouth daily.     fluticasone 50 MCG/ACT nasal spray  Commonly known as:  FLONASE  Place 2 sprays into both  nostrils at bedtime.     glyBURIDE-metformin 2.5-500 MG per tablet  Commonly known as:  GLUCOVANCE  Take 2 tablets by mouth 2 (two) times daily.     omeprazole 20 MG capsule  Commonly known as:  PRILOSEC  Take 20 mg by mouth 2 (two) times daily before a meal.     promethazine 25 MG tablet  Commonly known as:  PHENERGAN  Take 25-50 mg by mouth every 8 (eight) hours as needed (for migraine induced nausea).     topiramate 25 MG tablet  Commonly known as:  TOPAMAX  Take 25-50 mg by mouth 2 (two) times daily. 50 mg in the morning and 25 mg at night        Allergies:  Allergies  Allergen Reactions  . Nsaids Anaphylaxis  . Ace Inhibitors     Past Medical History, Surgical history, Social history, and Family History were reviewed and updated.  Review of Systems: All other 10 point review of systems is negative.   Physical Exam:  height is 5\' 1"  (1.549 m) and weight is 130 lb (58.968 kg). Her oral temperature is 97.3 F (36.3 C). Her blood pressure is 143/73 and her pulse is 77. Her respiration is 14.   Wt Readings from Last 3 Encounters:  09/21/14 130 lb (58.968  kg)  07/27/14 130 lb (58.968 kg)  07/01/14 131 lb (59.421 kg)    Ocular: Sclerae unicteric, pupils equal, round and reactive to light Ear-nose-throat: Oropharynx clear, dentition fair Lymphatic: No cervical or supraclavicular adenopathy Lungs no rales or rhonchi, good excursion bilaterally Heart regular rate and rhythm, no murmur appreciated Abd soft, nontender, positive bowel sounds MSK no focal spinal tenderness, no joint edema Neuro: non-focal, well-oriented, appropriate affect Breasts: Deferred  Lab Results  Component Value Date   WBC 7.7 09/21/2014   HGB 11.6 09/21/2014   HCT 36.1 09/21/2014   MCV 94 09/21/2014   PLT 198 09/21/2014   Lab Results  Component Value Date   FERRITIN 572* 07/27/2014   IRON 54 07/27/2014   TIBC 268 07/27/2014   UIBC 214 07/27/2014   IRONPCTSAT 20* 07/27/2014   Lab  Results  Component Value Date   RETICCTPCT 0.8 07/27/2014   RBC 3.83 09/21/2014   RETICCTABS 34.7 07/27/2014   No results found for: KPAFRELGTCHN, LAMBDASER, KAPLAMBRATIO No results found for: Kandis Cocking [facility] Lab Results  Component Value Date   TOTALPROTELP 7.3 07/23/2013   ALBUMINELP 61.9 07/23/2013   A1GS 3.7 07/23/2013   A2GS 10.8 07/23/2013   BETS 7.0 07/23/2013   BETA2SER 3.8 07/23/2013   GAMS 12.8 07/23/2013   MSPIKE NOT DET 07/23/2013   SPEI * 07/23/2013     Chemistry      Component Value Date/Time   NA 144 09/21/2014 1022   NA 135 07/23/2013 1532   K 5.8* 09/21/2014 1022   K 5.2 07/23/2013 1532   CL 105 09/21/2014 1022   CL 106 07/23/2013 1532   CO2 24 09/21/2014 1022   CO2 22 07/23/2013 1532   BUN 34* 09/21/2014 1022   BUN 21 07/23/2013 1532   CREATININE 1.2 09/21/2014 1022   CREATININE 1.13* 07/23/2013 1532      Component Value Date/Time   CALCIUM 9.8 09/21/2014 1022   CALCIUM 9.5 07/23/2013 1532   ALKPHOS 60 09/21/2014 1022   ALKPHOS 48 07/23/2013 1532   AST 16 09/21/2014 1022   AST 14 07/23/2013 1532   ALT 12 09/21/2014 1022   ALT 15 07/23/2013 1532   BILITOT 0.40 09/21/2014 1022   BILITOT 0.2 07/23/2013 1532     Impression and Plan: Kelly Soto is 58 year old white female with diabetes and iron deficiency anemia. She last received Fereheme and Aranesp in January. She is feeling tired and sleeping a lot.  Her Hgb today is now 11.6 and  MCV 94. We will see what her iron studies show.  We will go ahead and give her a dose of Fereheme today. No Aranesp. We will see her back in 2 months for labs and follow-up.  She knows to call here with any questions or concerns and to go to the ED in the event of an emergency. We can certainly see her sooner if need be.   Kelly Bottom, NP 3/28/201610:49 AM

## 2014-09-21 NOTE — Patient Instructions (Signed)

## 2014-11-10 ENCOUNTER — Ambulatory Visit (HOSPITAL_BASED_OUTPATIENT_CLINIC_OR_DEPARTMENT_OTHER): Payer: BLUE CROSS/BLUE SHIELD | Admitting: Hematology & Oncology

## 2014-11-10 ENCOUNTER — Ambulatory Visit (HOSPITAL_BASED_OUTPATIENT_CLINIC_OR_DEPARTMENT_OTHER): Payer: BLUE CROSS/BLUE SHIELD

## 2014-11-10 ENCOUNTER — Other Ambulatory Visit (HOSPITAL_BASED_OUTPATIENT_CLINIC_OR_DEPARTMENT_OTHER): Payer: BLUE CROSS/BLUE SHIELD

## 2014-11-10 ENCOUNTER — Encounter: Payer: Self-pay | Admitting: Hematology & Oncology

## 2014-11-10 VITALS — BP 132/76 | HR 89 | Temp 98.5°F | Resp 14 | Ht 61.0 in | Wt 127.0 lb

## 2014-11-10 DIAGNOSIS — N183 Chronic kidney disease, stage 3 unspecified: Secondary | ICD-10-CM

## 2014-11-10 DIAGNOSIS — D631 Anemia in chronic kidney disease: Secondary | ICD-10-CM

## 2014-11-10 DIAGNOSIS — D509 Iron deficiency anemia, unspecified: Secondary | ICD-10-CM | POA: Diagnosis not present

## 2014-11-10 DIAGNOSIS — N189 Chronic kidney disease, unspecified: Secondary | ICD-10-CM | POA: Diagnosis not present

## 2014-11-10 DIAGNOSIS — E119 Type 2 diabetes mellitus without complications: Secondary | ICD-10-CM

## 2014-11-10 LAB — CBC WITH DIFFERENTIAL (CANCER CENTER ONLY)
BASO#: 0.1 10*3/uL (ref 0.0–0.2)
BASO%: 0.8 % (ref 0.0–2.0)
EOS%: 1.2 % (ref 0.0–7.0)
Eosinophils Absolute: 0.1 10*3/uL (ref 0.0–0.5)
HCT: 28.5 % — ABNORMAL LOW (ref 34.8–46.6)
HGB: 9.2 g/dL — ABNORMAL LOW (ref 11.6–15.9)
LYMPH#: 1.7 10*3/uL (ref 0.9–3.3)
LYMPH%: 21.7 % (ref 14.0–48.0)
MCH: 30 pg (ref 26.0–34.0)
MCHC: 32.3 g/dL (ref 32.0–36.0)
MCV: 93 fL (ref 81–101)
MONO#: 0.6 10*3/uL (ref 0.1–0.9)
MONO%: 7.6 % (ref 0.0–13.0)
NEUT#: 5.2 10*3/uL (ref 1.5–6.5)
NEUT%: 68.7 % (ref 39.6–80.0)
Platelets: 243 10*3/uL (ref 145–400)
RBC: 3.07 10*6/uL — ABNORMAL LOW (ref 3.70–5.32)
RDW: 14 % (ref 11.1–15.7)
WBC: 7.6 10*3/uL (ref 3.9–10.0)

## 2014-11-10 LAB — CMP (CANCER CENTER ONLY)
ALT(SGPT): 13 U/L (ref 10–47)
AST: 15 U/L (ref 11–38)
Albumin: 3.7 g/dL (ref 3.3–5.5)
Alkaline Phosphatase: 44 U/L (ref 26–84)
BUN, Bld: 27 mg/dL — ABNORMAL HIGH (ref 7–22)
CO2: 21 mEq/L (ref 18–33)
Calcium: 9.6 mg/dL (ref 8.0–10.3)
Chloride: 107 mEq/L (ref 98–108)
Creat: 1.3 mg/dl — ABNORMAL HIGH (ref 0.6–1.2)
Glucose, Bld: 218 mg/dL — ABNORMAL HIGH (ref 73–118)
Potassium: 5.1 mEq/L — ABNORMAL HIGH (ref 3.3–4.7)
Sodium: 144 mEq/L (ref 128–145)
Total Bilirubin: 0.6 mg/dl (ref 0.20–1.60)
Total Protein: 7.1 g/dL (ref 6.4–8.1)

## 2014-11-10 LAB — RETICULOCYTES (CHCC)
ABS Retic: 66.2 10*3/uL (ref 19.0–186.0)
RBC.: 3.15 MIL/uL — ABNORMAL LOW (ref 3.87–5.11)
Retic Ct Pct: 2.1 % (ref 0.4–2.3)

## 2014-11-10 LAB — IRON AND TIBC CHCC
%SAT: 63 % — ABNORMAL HIGH (ref 21–57)
Iron: 148 ug/dL — ABNORMAL HIGH (ref 41–142)
TIBC: 235 ug/dL — ABNORMAL LOW (ref 236–444)
UIBC: 87 ug/dL — ABNORMAL LOW (ref 120–384)

## 2014-11-10 LAB — FERRITIN CHCC: Ferritin: 924 ng/ml — ABNORMAL HIGH (ref 9–269)

## 2014-11-10 MED ORDER — DARBEPOETIN ALFA 300 MCG/0.6ML IJ SOSY
PREFILLED_SYRINGE | INTRAMUSCULAR | Status: AC
  Filled 2014-11-10: qty 0.6

## 2014-11-10 MED ORDER — SODIUM CHLORIDE 0.9 % IV SOLN
510.0000 mg | Freq: Once | INTRAVENOUS | Status: AC
  Administered 2014-11-10: 510 mg via INTRAVENOUS
  Filled 2014-11-10: qty 17

## 2014-11-10 MED ORDER — DARBEPOETIN ALFA 300 MCG/0.6ML IJ SOSY
300.0000 ug | PREFILLED_SYRINGE | Freq: Once | INTRAMUSCULAR | Status: AC
  Administered 2014-11-10: 300 ug via SUBCUTANEOUS

## 2014-11-10 NOTE — Progress Notes (Signed)
Hematology and Oncology Follow Up Visit  Kelly Soto [id number] [birth year] 59 y.o. 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 is a back for followup.  She is doing okay. She does feel tired. She is clinically anemic. She always knows when she has low blood counts.  She's not watching her blood sugars. I'm sure they probably on the high side. Patch not noted any bleeding. Per she's working. She's had no problems with nausea or vomiting. She's had no rashes present no leg swelling.  There's been no weight loss or weight gain.  She is off work next week for her anniversary. Patient be going to the beach in June.      Medications:  Current outpatient prescriptions:  .  baclofen (LIORESAL) 10 MG tablet, Take 10 mg by mouth as needed. migraines, Disp: , Rfl:  .  BENICAR HCT 40-25 MG per tablet, Take 1 tablet by mouth daily., Disp: , Rfl:  .  Calcium Carbonate-Vitamin D (CALCIUM 500/VITAMIN D PO), Take by mouth 2 (two) times daily. GUMMIES, Disp: , Rfl:  .  fluticasone (FLONASE) 50 MCG/ACT nasal spray, Place 2 sprays into both nostrils at bedtime., Disp: , Rfl:  .  glyBURIDE-metformin (GLUCOVANCE) 2.5-500 MG per tablet, Take 2 tablets by mouth 2 (two) times daily., Disp: , Rfl:  .  omeprazole (PRILOSEC) 20 MG capsule, Take 20 mg by mouth 2 (two) times daily before a meal. , Disp: , Rfl:  .  Pediatric Multiple Vit-C-FA (FLINSTONES GUMMIES OMEGA-3 DHA PO), Take 2 tablets by mouth daily., Disp: , Rfl:  .  promethazine (PHENERGAN) 25 MG tablet, Take 25-50 mg by mouth every 8 (eight) hours as needed (for migraine induced nausea)., Disp: , Rfl:  .  topiramate (TOPAMAX) 25 MG tablet, Take 25-50 mg by mouth 2 (two) times daily.  50 mg in the morning and 25 mg at night, Disp: , Rfl:  .  denosumab (PROLIA) 60 MG/ML SOLN injection, Inject 60 mg into the skin every 6 (six) months. Administer in upper arm, thigh, or abdomen, Disp: , Rfl:  No current facility-administered medications for this visit.  Facility-Administered Medications Ordered in Other Visits:  .  Darbepoetin Alfa (ARANESP) injection 300 mcg, 300 mcg, Subcutaneous, Once, Volanda Napoleon, MD  Allergies:  Allergies  Allergen Reactions  . Nsaids Anaphylaxis  . Ace Inhibitors     Past Medical History, Surgical history, Social history, and Family History were reviewed and updated.  Review of Systems: As above  Physical Exam:  height is 5\' 1"  (1.549 m) and weight is 127 lb (57.607 kg). Her oral temperature is 98.5 F (36.9 C). Her blood pressure is 132/76 and her pulse is 89. Her respiration is 14.   Well-developed and well-nourished white female in no obvious distress. Head and neck exam shows no ocular or oral lesions. She is no palpable cervical or supraclavicular lymph nodes. Lungs are clear with no wheezes or rhonchi. Cardiac exam regular rate and rhythm with no murmurs rubs or bruits. Abdomen is soft. She is good bowel sounds. There is no fluid wave. There is no palpable liver or spleen tip. Back exam shows no tenderness over the spine ribs or hips. Extremities shows no clubbing cyanosis  or edema. Skin exam shows no rashes. Neurological exam is nonfocal.  Lab Results  Component Value Date   WBC 7.6 11/10/2014   HGB 9.2* 11/10/2014   HCT 28.5* 11/10/2014   MCV 93 11/10/2014   PLT 243 11/10/2014     Chemistry      Component Value Date/Time   NA 144 11/10/2014 0826   NA 135 07/23/2013 1532   K 5.1* 11/10/2014 0826   K 5.2 07/23/2013 1532   CL 107 11/10/2014 0826   CL 106 07/23/2013 1532   CO2 21 11/10/2014 0826   CO2 22 07/23/2013 1532   BUN 27* 11/10/2014 0826   BUN 21 07/23/2013 1532   CREATININE 1.3* 11/10/2014 0826   CREATININE 1.13*  07/23/2013 1532      Component Value Date/Time   CALCIUM 9.6 11/10/2014 0826   CALCIUM 9.5 07/23/2013 1532   ALKPHOS 44 11/10/2014 0826   ALKPHOS 48 07/23/2013 1532   AST 15 11/10/2014 0826   AST 14 07/23/2013 1532   ALT 13 11/10/2014 0826   ALT 15 07/23/2013 1532   BILITOT 0.60 11/10/2014 0826   BILITOT 0.2 07/23/2013 1532         Impression and Plan: Kelly Soto is 59 year old white female. She has diabetes. She has iron deficiency anemia. She has a very low erythropoietin level.  Her blood count is low. As such, we will give her Aranesp and iron today. This uses it works very well for her.  We'll plan to get her back in 6 weeks. Kelly Soto I spent about 25 minutes with her today. I went over her labs area did I spent quite a bit time trying to get her set up for her injection and iron.   Volanda Napoleon, MD 5/17/20169:39 AM

## 2014-11-10 NOTE — Patient Instructions (Signed)
Ferumoxytol injection What is this medicine? FERUMOXYTOL is an iron complex. Iron is used to make healthy red blood cells, which carry oxygen and nutrients throughout the body. This medicine is used to treat iron deficiency anemia in people with chronic kidney disease. This medicine may be used for other purposes; ask your health care provider or pharmacist if you have questions. COMMON BRAND NAME(S): Feraheme What should I tell my health care provider before I take this medicine? They need to know if you have any of these conditions: -anemia not caused by low iron levels -high levels of iron in the blood -magnetic resonance imaging (MRI) test scheduled -an unusual or allergic reaction to iron, other medicines, foods, dyes, or preservatives -pregnant or trying to get pregnant -breast-feeding How should I use this medicine? This medicine is for injection into a vein. It is given by a health care professional in a hospital or clinic setting. Talk to your pediatrician regarding the use of this medicine in children. Special care may be needed. Overdosage: If you think you've taken too much of this medicine contact a poison control center or emergency room at once. Overdosage: If you think you have taken too much of this medicine contact a poison control center or emergency room at once. NOTE: This medicine is only for you. Do not share this medicine with others. What if I miss a dose? It is important not to miss your dose. Call your doctor or health care professional if you are unable to keep an appointment. What may interact with this medicine? This medicine may interact with the following medications: -other iron products This list may not describe all possible interactions. Give your health care provider a list of all the medicines, herbs, non-prescription drugs, or dietary supplements you use. Also tell them if you smoke, drink alcohol, or use illegal drugs. Some items may interact with your  medicine. What should I watch for while using this medicine? Visit your doctor or healthcare professional regularly. Tell your doctor or healthcare professional if your symptoms do not start to get better or if they get worse. You may need blood work done while you are taking this medicine. You may need to follow a special diet. Talk to your doctor. Foods that contain iron include: whole grains/cereals, dried fruits, beans, or peas, leafy green vegetables, and organ meats (liver, kidney). What side effects may I notice from receiving this medicine? Side effects that you should report to your doctor or health care professional as soon as possible: -allergic reactions like skin rash, itching or hives, swelling of the face, lips, or tongue -breathing problems -changes in blood pressure -feeling faint or lightheaded, falls -fever or chills -flushing, sweating, or hot feelings -swelling of the ankles or feet Side effects that usually do not require medical attention (Report these to your doctor or health care professional if they continue or are bothersome.): -diarrhea -headache -nausea, vomiting -stomach pain This list may not describe all possible side effects. Call your doctor for medical advice about side effects. You may report side effects to FDA at 1-800-FDA-1088. Where should I keep my medicine? This drug is given in a hospital or clinic and will not be stored at home. NOTE: This sheet is a summary. It may not cover all possible information. If you have questions about this medicine, talk to your doctor, pharmacist, or health care provider.  2015, Elsevier/Gold Standard. (2012-01-26 15:23:36)   Darbepoetin Alfa injection What is this medicine? DARBEPOETIN ALFA (dar be POE   e tin AL fa) helps your body make more red blood cells. It is used to treat anemia caused by chronic kidney failure and chemotherapy. This medicine may be used for other purposes; ask your health care provider or  pharmacist if you have questions. COMMON BRAND NAME(S): Aranesp What should I tell my health care provider before I take this medicine? They need to know if you have any of these conditions: -blood clotting disorders or history of blood clots -cancer patient not on chemotherapy -cystic fibrosis -heart disease, such as angina, heart failure, or a history of a heart attack -hemoglobin level of 12 g/dL or greater -high blood pressure -low levels of folate, iron, or vitamin B12 -seizures -an unusual or allergic reaction to darbepoetin, erythropoietin, albumin, hamster proteins, latex, other medicines, foods, dyes, or preservatives -pregnant or trying to get pregnant -breast-feeding How should I use this medicine? This medicine is for injection into a vein or under the skin. It is usually given by a health care professional in a hospital or clinic setting. If you get this medicine at home, you will be taught how to prepare and give this medicine. Do not shake the solution before you withdraw a dose. Use exactly as directed. Take your medicine at regular intervals. Do not take your medicine more often than directed. It is important that you put your used needles and syringes in a special sharps container. Do not put them in a trash can. If you do not have a sharps container, call your pharmacist or healthcare provider to get one. Talk to your pediatrician regarding the use of this medicine in children. While this medicine may be used in children as young as 1 year for selected conditions, precautions do apply. Overdosage: If you think you have taken too much of this medicine contact a poison control center or emergency room at once. NOTE: This medicine is only for you. Do not share this medicine with others. What if I miss a dose? If you miss a dose, take it as soon as you can. If it is almost time for your next dose, take only that dose. Do not take double or extra doses. What may interact with  this medicine? Do not take this medicine with any of the following medications: -epoetin alfa This list may not describe all possible interactions. Give your health care provider a list of all the medicines, herbs, non-prescription drugs, or dietary supplements you use. Also tell them if you smoke, drink alcohol, or use illegal drugs. Some items may interact with your medicine. What should I watch for while using this medicine? Visit your prescriber or health care professional for regular checks on your progress and for the needed blood tests and blood pressure measurements. It is especially important for the doctor to make sure your hemoglobin level is in the desired range, to limit the risk of potential side effects and to give you the best benefit. Keep all appointments for any recommended tests. Check your blood pressure as directed. Ask your doctor what your blood pressure should be and when you should contact him or her. As your body makes more red blood cells, you may need to take iron, folic acid, or vitamin B supplements. Ask your doctor or health care provider which products are right for you. If you have kidney disease continue dietary restrictions, even though this medication can make you feel better. Talk with your doctor or health care professional about the foods you eat and the vitamins that   you take. What side effects may I notice from receiving this medicine? Side effects that you should report to your doctor or health care professional as soon as possible: -allergic reactions like skin rash, itching or hives, swelling of the face, lips, or tongue -breathing problems -changes in vision -chest pain -confusion, trouble speaking or understanding -feeling faint or lightheaded, falls -high blood pressure -muscle aches or pains -pain, swelling, warmth in the leg -rapid weight gain -severe headaches -sudden numbness or weakness of the face, arm or leg -trouble walking, dizziness, loss  of balance or coordination -seizures (convulsions) -swelling of the ankles, feet, hands -unusually weak or tired Side effects that usually do not require medical attention (report to your doctor or health care professional if they continue or are bothersome): -diarrhea -fever, chills (flu-like symptoms) -headaches -nausea, vomiting -redness, stinging, or swelling at site where injected This list may not describe all possible side effects. Call your doctor for medical advice about side effects. You may report side effects to FDA at 1-800-FDA-1088. Where should I keep my medicine? Keep out of the reach of children. Store in a refrigerator between 2 and 8 degrees C (36 and 46 degrees F). Do not freeze. Do not shake. Throw away any unused portion if using a single-dose vial. Throw away any unused medicine after the expiration date. NOTE: This sheet is a summary. It may not cover all possible information. If you have questions about this medicine, talk to your doctor, pharmacist, or health care provider.  2015, Elsevier/Gold Standard. (2008-05-26 10:23:57)  

## 2014-11-16 ENCOUNTER — Other Ambulatory Visit: Payer: BLUE CROSS/BLUE SHIELD

## 2014-11-16 ENCOUNTER — Ambulatory Visit: Payer: BLUE CROSS/BLUE SHIELD | Admitting: Hematology & Oncology

## 2014-11-16 ENCOUNTER — Ambulatory Visit: Payer: BLUE CROSS/BLUE SHIELD

## 2015-01-12 ENCOUNTER — Other Ambulatory Visit (HOSPITAL_BASED_OUTPATIENT_CLINIC_OR_DEPARTMENT_OTHER): Payer: BLUE CROSS/BLUE SHIELD

## 2015-01-12 ENCOUNTER — Ambulatory Visit: Payer: BLUE CROSS/BLUE SHIELD

## 2015-01-12 ENCOUNTER — Encounter: Payer: Self-pay | Admitting: Hematology & Oncology

## 2015-01-12 ENCOUNTER — Ambulatory Visit (HOSPITAL_BASED_OUTPATIENT_CLINIC_OR_DEPARTMENT_OTHER): Payer: BLUE CROSS/BLUE SHIELD | Admitting: Hematology & Oncology

## 2015-01-12 VITALS — BP 170/74 | HR 70 | Temp 98.1°F | Resp 16 | Wt 129.0 lb

## 2015-01-12 DIAGNOSIS — N189 Chronic kidney disease, unspecified: Secondary | ICD-10-CM

## 2015-01-12 DIAGNOSIS — N183 Chronic kidney disease, stage 3 unspecified: Secondary | ICD-10-CM

## 2015-01-12 DIAGNOSIS — D509 Iron deficiency anemia, unspecified: Secondary | ICD-10-CM | POA: Diagnosis not present

## 2015-01-12 DIAGNOSIS — D631 Anemia in chronic kidney disease: Secondary | ICD-10-CM | POA: Diagnosis not present

## 2015-01-12 DIAGNOSIS — R21 Rash and other nonspecific skin eruption: Secondary | ICD-10-CM

## 2015-01-12 DIAGNOSIS — Z9884 Bariatric surgery status: Secondary | ICD-10-CM

## 2015-01-12 LAB — CBC WITH DIFFERENTIAL (CANCER CENTER ONLY)
BASO#: 0 10*3/uL (ref 0.0–0.2)
BASO%: 0.6 % (ref 0.0–2.0)
EOS%: 2.4 % (ref 0.0–7.0)
Eosinophils Absolute: 0.2 10*3/uL (ref 0.0–0.5)
HCT: 34 % — ABNORMAL LOW (ref 34.8–46.6)
HGB: 11 g/dL — ABNORMAL LOW (ref 11.6–15.9)
LYMPH#: 1.6 10*3/uL (ref 0.9–3.3)
LYMPH%: 24 % (ref 14.0–48.0)
MCH: 31.6 pg (ref 26.0–34.0)
MCHC: 32.4 g/dL (ref 32.0–36.0)
MCV: 98 fL (ref 81–101)
MONO#: 0.5 10*3/uL (ref 0.1–0.9)
MONO%: 7.1 % (ref 0.0–13.0)
NEUT#: 4.4 10*3/uL (ref 1.5–6.5)
NEUT%: 65.9 % (ref 39.6–80.0)
Platelets: 204 10*3/uL (ref 145–400)
RBC: 3.48 10*6/uL — ABNORMAL LOW (ref 3.70–5.32)
RDW: 12.4 % (ref 11.1–15.7)
WBC: 6.7 10*3/uL (ref 3.9–10.0)

## 2015-01-12 LAB — CMP (CANCER CENTER ONLY)
ALT(SGPT): 26 U/L (ref 10–47)
AST: 23 U/L (ref 11–38)
Albumin: 3.3 g/dL (ref 3.3–5.5)
Alkaline Phosphatase: 52 U/L (ref 26–84)
BUN, Bld: 29 mg/dL — ABNORMAL HIGH (ref 7–22)
CO2: 22 mEq/L (ref 18–33)
Calcium: 9.2 mg/dL (ref 8.0–10.3)
Chloride: 110 mEq/L — ABNORMAL HIGH (ref 98–108)
Creat: 1.2 mg/dl (ref 0.6–1.2)
Glucose, Bld: 250 mg/dL — ABNORMAL HIGH (ref 73–118)
Potassium: 5.7 mEq/L — ABNORMAL HIGH (ref 3.3–4.7)
Sodium: 140 mEq/L (ref 128–145)
Total Bilirubin: 0.4 mg/dl (ref 0.20–1.60)
Total Protein: 6.4 g/dL (ref 6.4–8.1)

## 2015-01-12 LAB — FERRITIN CHCC: Ferritin: 915 ng/ml — ABNORMAL HIGH (ref 9–269)

## 2015-01-12 LAB — IRON AND TIBC CHCC
%SAT: 64 % — ABNORMAL HIGH (ref 21–57)
Iron: 141 ug/dL (ref 41–142)
TIBC: 219 ug/dL — ABNORMAL LOW (ref 236–444)
UIBC: 78 ug/dL — ABNORMAL LOW (ref 120–384)

## 2015-01-12 LAB — RETICULOCYTES (CHCC)
ABS Retic: 21.6 10*3/uL (ref 19.0–186.0)
RBC.: 3.6 MIL/uL — ABNORMAL LOW (ref 3.87–5.11)
Retic Ct Pct: 0.6 % (ref 0.4–2.3)

## 2015-01-12 MED ORDER — CLINDAMYCIN PHOSPHATE 1 % EX LOTN: TOPICAL_LOTION | Freq: Two times a day (BID) | CUTANEOUS | Status: DC

## 2015-01-12 NOTE — Progress Notes (Signed)
Hematology and Oncology Follow Up Visit  Kelly Soto [id number] [birth year] 59 y.o. 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 is a back for followup.  She is doing okay. She does have this rash on her arms. She's had this for a few weeks.  Disease V helping it. I'll try some Cleocin lotion.  Her blood sugars are doing okay.  She's under a lot of stress at work. This really is causing her issues. She would love to quit but she needs the health insurance because of all her health problems.  She's had no bleeding. There's been no change in bowel or bladder habits.  She's had no leg swelling.  We last saw her May, her ferritin was 924 with an iron saturation of 63%.  Overall, her performance status is ECOG 1.     Medications:  Current outpatient prescriptions:  .  baclofen (LIORESAL) 10 MG tablet, Take 10 mg by mouth as needed. migraines, Disp: , Rfl:  .  BENICAR HCT 40-25 MG per tablet, Take 1 tablet by mouth daily., Disp: , Rfl:  .  Calcium Carbonate-Vitamin D (CALCIUM 500/VITAMIN D PO), Take by mouth 2 (two) times daily. GUMMIES, Disp: , Rfl:  .  denosumab (PROLIA) 60 MG/ML SOLN injection, Inject 60 mg into the skin every 6 (six) months. Administer in upper arm, thigh, or abdomen, Disp: , Rfl:  .  fluticasone (FLONASE) 50 MCG/ACT nasal spray, Place 2 sprays into both nostrils at bedtime., Disp: , Rfl:  .  glyBURIDE-metformin (GLUCOVANCE) 2.5-500 MG per tablet, Take 2 tablets by mouth 2 (two) times daily., Disp: , Rfl:  .  omeprazole (PRILOSEC) 20 MG capsule, Take 20 mg by mouth 2 (two) times daily before a meal. , Disp: , Rfl:  .  Pediatric Multiple Vit-C-FA (FLINSTONES GUMMIES OMEGA-3 DHA PO), Take 2  tablets by mouth daily., Disp: , Rfl:  .  promethazine (PHENERGAN) 25 MG tablet, Take 25-50 mg by mouth every 8 (eight) hours as needed (for migraine induced nausea)., Disp: , Rfl:  .  topiramate (TOPAMAX) 25 MG tablet, Take 25-50 mg by mouth 2 (two) times daily. 50 mg in the morning and 25 mg at night, Disp: , Rfl:   Allergies:  Allergies  Allergen Reactions  . Nsaids Anaphylaxis  . Ace Inhibitors     Past Medical History, Surgical history, Social history, and Family History were reviewed and updated.  Review of Systems: As above  Physical Exam:  weight is 129 lb (58.514 kg). Her oral temperature is 98.1 F (36.7 C). Her blood pressure is 170/74 and her pulse is 70. Her respiration is 16.   Well-developed and well-nourished white female in no obvious distress. Head and neck exam shows no ocular or oral lesions. She is no palpable cervical or supraclavicular lymph nodes. Lungs are clear with no wheezes or rhonchi. Cardiac exam regular rate and rhythm with no murmurs rubs or bruits. Abdomen is soft. She is good bowel sounds. There is no fluid wave. There is no palpable liver or spleen tip. Back exam shows no tenderness over the spine ribs or hips. Extremities shows no clubbing cyanosis or edema. Skin exam shows a macular type rash on her forearms.. Neurological exam  is nonfocal.  Lab Results  Component Value Date   WBC 6.7 01/12/2015   HGB 11.0* 01/12/2015   HCT 34.0* 01/12/2015   MCV 98 01/12/2015   PLT 204 01/12/2015     Chemistry      Component Value Date/Time   NA 144 11/10/2014 0826   NA 135 07/23/2013 1532   K 5.1* 11/10/2014 0826   K 5.2 07/23/2013 1532   CL 107 11/10/2014 0826   CL 106 07/23/2013 1532   CO2 21 11/10/2014 0826   CO2 22 07/23/2013 1532   BUN 27* 11/10/2014 0826   BUN 21 07/23/2013 1532   CREATININE 1.3* 11/10/2014 0826   CREATININE 1.13* 07/23/2013 1532      Component Value Date/Time   CALCIUM 9.6 11/10/2014 0826   CALCIUM 9.5 07/23/2013 1532    ALKPHOS 44 11/10/2014 0826   ALKPHOS 48 07/23/2013 1532   AST 15 11/10/2014 0826   AST 14 07/23/2013 1532   ALT 13 11/10/2014 0826   ALT 15 07/23/2013 1532   BILITOT 0.60 11/10/2014 0826   BILITOT 0.2 07/23/2013 1532         Impression and Plan: Kelly Soto is 59 year old white female. She has diabetes. She has iron deficiency anemia. She has a very low erythropoietin level.  She's doing much better today. Her blood count is doing well. She does not need any Aranesp. I would think that her iron level should be okay.  We'll plan to get her back to see [place] in another 2-3 months.  She has this rash on her arm. I will try some Cleocin lotion for this.   Kelly Napoleon, MD 7/19/20168:54 AM

## 2015-01-13 ENCOUNTER — Telehealth: Payer: Self-pay | Admitting: *Deleted

## 2015-01-13 NOTE — Telephone Encounter (Signed)
-----   Message from Volanda Napoleon, MD sent at 01/12/2015 11:22 AM EDT ----- Call - iron levels are ok!!  pete

## 2015-01-18 ENCOUNTER — Other Ambulatory Visit: Payer: Self-pay

## 2015-01-18 DIAGNOSIS — Z1231 Encounter for screening mammogram for malignant neoplasm of breast: Secondary | ICD-10-CM

## 2015-01-21 ENCOUNTER — Other Ambulatory Visit: Payer: Self-pay | Admitting: General Practice

## 2015-01-22 ENCOUNTER — Other Ambulatory Visit: Payer: Self-pay

## 2015-01-22 ENCOUNTER — Ambulatory Visit
Admission: RE | Admit: 2015-01-22 | Discharge: 2015-01-22 | Disposition: A | Discharge status: Home / Self Care | Payer: BLUE CROSS/BLUE SHIELD | Source: Ambulatory Visit

## 2015-01-22 DIAGNOSIS — Z1231 Encounter for screening mammogram for malignant neoplasm of breast: Secondary | ICD-10-CM

## 2015-03-02 ENCOUNTER — Other Ambulatory Visit (HOSPITAL_BASED_OUTPATIENT_CLINIC_OR_DEPARTMENT_OTHER): Payer: BLUE CROSS/BLUE SHIELD

## 2015-03-02 ENCOUNTER — Encounter: Payer: Self-pay | Admitting: Family

## 2015-03-02 ENCOUNTER — Ambulatory Visit (HOSPITAL_BASED_OUTPATIENT_CLINIC_OR_DEPARTMENT_OTHER): Payer: BLUE CROSS/BLUE SHIELD

## 2015-03-02 ENCOUNTER — Ambulatory Visit (HOSPITAL_BASED_OUTPATIENT_CLINIC_OR_DEPARTMENT_OTHER): Payer: BLUE CROSS/BLUE SHIELD | Admitting: Family

## 2015-03-02 VITALS — BP 155/69 | HR 79 | Temp 97.6°F | Resp 16 | Ht 61.0 in | Wt 128.0 lb

## 2015-03-02 DIAGNOSIS — D631 Anemia in chronic kidney disease: Secondary | ICD-10-CM | POA: Diagnosis not present

## 2015-03-02 DIAGNOSIS — Z9884 Bariatric surgery status: Secondary | ICD-10-CM | POA: Diagnosis not present

## 2015-03-02 DIAGNOSIS — N183 Chronic kidney disease, stage 3 unspecified: Secondary | ICD-10-CM

## 2015-03-02 DIAGNOSIS — D509 Iron deficiency anemia, unspecified: Secondary | ICD-10-CM

## 2015-03-02 DIAGNOSIS — N189 Chronic kidney disease, unspecified: Secondary | ICD-10-CM

## 2015-03-02 LAB — CBC WITH DIFFERENTIAL (CANCER CENTER ONLY)
BASO#: 0 10*3/uL (ref 0.0–0.2)
BASO%: 0.4 % (ref 0.0–2.0)
EOS%: 1.2 % (ref 0.0–7.0)
Eosinophils Absolute: 0.1 10*3/uL (ref 0.0–0.5)
HCT: 33.6 % — ABNORMAL LOW (ref 34.8–46.6)
HGB: 10.4 g/dL — ABNORMAL LOW (ref 11.6–15.9)
LYMPH#: 1.6 10*3/uL (ref 0.9–3.3)
LYMPH%: 20.2 % (ref 14.0–48.0)
MCH: 29.3 pg (ref 26.0–34.0)
MCHC: 31 g/dL — ABNORMAL LOW (ref 32.0–36.0)
MCV: 95 fL (ref 81–101)
MONO#: 0.7 10*3/uL (ref 0.1–0.9)
MONO%: 8.1 % (ref 0.0–13.0)
NEUT#: 5.7 10*3/uL (ref 1.5–6.5)
NEUT%: 70.1 % (ref 39.6–80.0)
Platelets: 217 10*3/uL (ref 145–400)
RBC: 3.55 10*6/uL — ABNORMAL LOW (ref 3.70–5.32)
RDW: 12.8 % (ref 11.1–15.7)
WBC: 8.1 10*3/uL (ref 3.9–10.0)

## 2015-03-02 LAB — FERRITIN CHCC: Ferritin: 801 ng/ml — ABNORMAL HIGH (ref 9–269)

## 2015-03-02 LAB — CMP (CANCER CENTER ONLY)
ALT(SGPT): 20 U/L (ref 10–47)
AST: 18 U/L (ref 11–38)
Albumin: 3.4 g/dL (ref 3.3–5.5)
Alkaline Phosphatase: 44 U/L (ref 26–84)
BUN, Bld: 32 mg/dL — ABNORMAL HIGH (ref 7–22)
CO2: 21 mEq/L (ref 18–33)
Calcium: 9.6 mg/dL (ref 8.0–10.3)
Chloride: 103 mEq/L (ref 98–108)
Creat: 1.2 mg/dl (ref 0.6–1.2)
Glucose, Bld: 266 mg/dL — ABNORMAL HIGH (ref 73–118)
Potassium: 5.7 mEq/L — ABNORMAL HIGH (ref 3.3–4.7)
Sodium: 140 mEq/L (ref 128–145)
Total Bilirubin: 0.3 mg/dl (ref 0.20–1.60)
Total Protein: 6.6 g/dL (ref 6.4–8.1)

## 2015-03-02 LAB — IRON AND TIBC CHCC
%SAT: 52 % (ref 21–57)
Iron: 125 ug/dL (ref 41–142)
TIBC: 241 ug/dL (ref 236–444)
UIBC: 117 ug/dL — ABNORMAL LOW (ref 120–384)

## 2015-03-02 MED ORDER — DARBEPOETIN ALFA 300 MCG/0.6ML IJ SOSY
300.0000 ug | PREFILLED_SYRINGE | Freq: Once | INTRAMUSCULAR | Status: AC
  Administered 2015-03-02: 300 ug via SUBCUTANEOUS

## 2015-03-02 MED ORDER — DARBEPOETIN ALFA 300 MCG/0.6ML IJ SOSY
PREFILLED_SYRINGE | INTRAMUSCULAR | Status: AC
  Filled 2015-03-02: qty 0.6

## 2015-03-02 NOTE — Progress Notes (Signed)
Hematology and Oncology Follow Up Visit  Kelly Soto [id number] 03/22/1956 58 y.o. 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 She is feeling better but still has some fatigue at times. She last received Feraheme and Aranesp in May and has responded nicely.  She denies fever, chills, n/v, rash, cough, dizziness, SOB, chest pain, palpitations, abdominal pain, changes in bowel or bladder habits. She has not noticed any blood in her urine or stool.  No tenderness, numbness or tingling in her extremities. She has occasional swelling in her ankles which comes and goes. This is not a new issue for her. She has no c/o pain.  She is eating well and staying hydrated. Her weight is stable.   Medications:    Medication List       This list is accurate as of: 03/02/15  9:49 AM.  Always use your most recent med list.               baclofen 10 MG tablet  Commonly known as:  LIORESAL  Take 10 mg by mouth as needed. migraines     BENICAR HCT 40-25 MG per tablet  Generic drug:  olmesartan-hydrochlorothiazide  Take 1 tablet by mouth daily.     CALCIUM 500/VITAMIN D PO  Take by mouth 2 (two) times daily. GUMMIES     clindamycin 1 % lotion  Commonly known as:  CLEOCIN-T  Apply topically 2 (two) times daily.     denosumab 60 MG/ML Soln injection  Commonly known as:  PROLIA  Inject 60 mg into the skin every 6 (six) months. Administer in upper arm, thigh, or abdomen     diazepam 5 MG tablet  Commonly known as:  VALIUM  Take 5 mg by mouth daily as needed.     FLINSTONES GUMMIES OMEGA-3 DHA PO  Take 2 tablets by mouth daily.     fluticasone 50 MCG/ACT nasal spray  Commonly known as:  FLONASE  Place 2  sprays into both nostrils at bedtime.     glyBURIDE-metformin 2.5-500 MG per tablet  Commonly known as:  GLUCOVANCE  Take 2 tablets by mouth 2 (two) times daily.     meclizine 25 MG tablet  Commonly known as:  ANTIVERT  TAKE 1 TABLET BY MOUTH 3 TIMES A DAY AS NEEDED FOR VERTIGO     omeprazole 20 MG capsule  Commonly known as:  PRILOSEC  Take 20 mg by mouth 2 (two) times daily before a meal.     promethazine 25 MG tablet  Commonly known as:  PHENERGAN  Take 25-50 mg by mouth every 8 (eight) hours as needed (for migraine induced nausea).     topiramate 25 MG tablet  Commonly known as:  TOPAMAX  Take 25-50 mg by mouth 2 (two) times daily. 50 mg in the morning and 25 mg at night        Allergies:  Allergies  Allergen Reactions  . Nsaids Anaphylaxis  . Ace Inhibitors     Past Medical History, Surgical history, Social history, and Family History were reviewed and updated.  Review of Systems: All other 10 point review of systems is negative.   Physical Exam:  height is 5\' 1"  (1.549 m) and weight is  128 lb (58.06 kg). Her oral temperature is 97.6 F (36.4 C). Her blood pressure is 155/69 and her pulse is 79. Her respiration is 16.   Wt Readings from Last 3 Encounters:  03/02/15 128 lb (58.06 kg)  01/12/15 129 lb (58.514 kg)  11/10/14 127 lb (57.607 kg)    Ocular: Sclerae unicteric, pupils equal, round and reactive to light Ear-nose-throat: Oropharynx clear, dentition fair Lymphatic: No cervical or supraclavicular adenopathy Lungs no rales or rhonchi, good excursion bilaterally Heart regular rate and rhythm, no murmur appreciated Abd soft, nontender, positive bowel sounds MSK no focal spinal tenderness, no joint edema Neuro: non-focal, well-oriented, appropriate affect Breasts: Deferred  Lab Results  Component Value Date   WBC 8.1 03/02/2015   HGB 10.4* 03/02/2015   HCT 33.6* 03/02/2015   MCV 95 03/02/2015   PLT 217 03/02/2015   Lab Results  Component Value  Date   FERRITIN 915* 01/12/2015   IRON 141 01/12/2015   TIBC 219* 01/12/2015   UIBC 78* 01/12/2015   IRONPCTSAT 64* 01/12/2015   Lab Results  Component Value Date   RETICCTPCT 0.6 01/12/2015   RBC 3.55* 03/02/2015   RETICCTABS 21.6 01/12/2015   No results found for: KPAFRELGTCHN, LAMBDASER, KAPLAMBRATIO No results found for: Osborne Casco Lab Results  Component Value Date   TOTALPROTELP 7.3 07/23/2013   ALBUMINELP 61.9 07/23/2013   A1GS 3.7 07/23/2013   A2GS 10.8 07/23/2013   BETS 7.0 07/23/2013   BETA2SER 3.8 07/23/2013   GAMS 12.8 07/23/2013   MSPIKE NOT DET 07/23/2013   SPEI * 07/23/2013     Chemistry      Component Value Date/Time   NA 140 01/12/2015 0817   NA 135 07/23/2013 1532   K 5.7* 01/12/2015 0817   K 5.2 07/23/2013 1532   CL 110* 01/12/2015 0817   CL 106 07/23/2013 1532   CO2 22 01/12/2015 0817   CO2 22 07/23/2013 1532   BUN 29* 01/12/2015 0817   BUN 21 07/23/2013 1532   CREATININE 1.2 01/12/2015 0817   CREATININE 1.13* 07/23/2013 1532      Component Value Date/Time   CALCIUM 9.2 01/12/2015 0817   CALCIUM 9.5 07/23/2013 1532   ALKPHOS 52 01/12/2015 0817   ALKPHOS 48 07/23/2013 1532   AST 23 01/12/2015 0817   AST 14 07/23/2013 1532   ALT 26 01/12/2015 0817   ALT 15 07/23/2013 1532   BILITOT 0.40 01/12/2015 0817   BILITOT 0.2 07/23/2013 1532     Impression and Plan: Kelly Soto is 59 year old white female with iron deficiency anemia. She has had some mild fatigue but is feeling better.  Hgb today is 10.4. We will give her a dose of Aranesp.  We will see what her iron studies show and bring her in later this week for Aranesp if needed.  We will see her back in 2 months for labs and follow-up.  She knows to call here with any questions or concerns and to go to the ED in the event of an emergency. We can certainly see her sooner if need be.   Eliezer Bottom, NP 9/6/20169:49 AM

## 2015-03-02 NOTE — Patient Instructions (Signed)
Darbepoetin Alfa injection What is this medicine? DARBEPOETIN ALFA (dar be POE e tin AL fa) helps your body make more red blood cells. It is used to treat anemia caused by chronic kidney failure and chemotherapy. This medicine may be used for other purposes; ask your health care provider or pharmacist if you have questions. COMMON BRAND NAME(S): Aranesp What should I tell my health care provider before I take this medicine? They need to know if you have any of these conditions: -blood clotting disorders or history of blood clots -cancer patient not on chemotherapy -cystic fibrosis -heart disease, such as angina, heart failure, or a history of a heart attack -hemoglobin level of 12 g/dL or greater -high blood pressure -low levels of folate, iron, or vitamin B12 -seizures -an unusual or allergic reaction to darbepoetin, erythropoietin, albumin, hamster proteins, latex, other medicines, foods, dyes, or preservatives -pregnant or trying to get pregnant -breast-feeding How should I use this medicine? This medicine is for injection into a vein or under the skin. It is usually given by a health care professional in a hospital or clinic setting. If you get this medicine at home, you will be taught how to prepare and give this medicine. Do not shake the solution before you withdraw a dose. Use exactly as directed. Take your medicine at regular intervals. Do not take your medicine more often than directed. It is important that you put your used needles and syringes in a special sharps container. Do not put them in a trash can. If you do not have a sharps container, call your pharmacist or healthcare provider to get one. Talk to your pediatrician regarding the use of this medicine in children. While this medicine may be used in children as young as 1 year for selected conditions, precautions do apply. Overdosage: If you think you have taken too much of this medicine contact a poison control center or  emergency room at once. NOTE: This medicine is only for you. Do not share this medicine with others. What if I miss a dose? If you miss a dose, take it as soon as you can. If it is almost time for your next dose, take only that dose. Do not take double or extra doses. What may interact with this medicine? Do not take this medicine with any of the following medications: -epoetin alfa This list may not describe all possible interactions. Give your health care provider a list of all the medicines, herbs, non-prescription drugs, or dietary supplements you use. Also tell them if you smoke, drink alcohol, or use illegal drugs. Some items may interact with your medicine. What should I watch for while using this medicine? Visit your prescriber or health care professional for regular checks on your progress and for the needed blood tests and blood pressure measurements. It is especially important for the doctor to make sure your hemoglobin level is in the desired range, to limit the risk of potential side effects and to give you the best benefit. Keep all appointments for any recommended tests. Check your blood pressure as directed. Ask your doctor what your blood pressure should be and when you should contact him or her. As your body makes more red blood cells, you may need to take iron, folic acid, or vitamin B supplements. Ask your doctor or health care provider which products are right for you. If you have kidney disease continue dietary restrictions, even though this medication can make you feel better. Talk with your doctor or health   care professional about the foods you eat and the vitamins that you take. What side effects may I notice from receiving this medicine? Side effects that you should report to your doctor or health care professional as soon as possible: -allergic reactions like skin rash, itching or hives, swelling of the face, lips, or tongue -breathing problems -changes in vision -chest  pain -confusion, trouble speaking or understanding -feeling faint or lightheaded, falls -high blood pressure -muscle aches or pains -pain, swelling, warmth in the leg -rapid weight gain -severe headaches -sudden numbness or weakness of the face, arm or leg -trouble walking, dizziness, loss of balance or coordination -seizures (convulsions) -swelling of the ankles, feet, hands -unusually weak or tired Side effects that usually do not require medical attention (report to your doctor or health care professional if they continue or are bothersome): -diarrhea -fever, chills (flu-like symptoms) -headaches -nausea, vomiting -redness, stinging, or swelling at site where injected This list may not describe all possible side effects. Call your doctor for medical advice about side effects. You may report side effects to FDA at 1-800-FDA-1088. Where should I keep my medicine? Keep out of the reach of children. Store in a refrigerator between 2 and 8 degrees C (36 and 46 degrees F). Do not freeze. Do not shake. Throw away any unused portion if using a single-dose vial. Throw away any unused medicine after the expiration date. NOTE: This sheet is a summary. It may not cover all possible information. If you have questions about this medicine, talk to your doctor, pharmacist, or health care provider.  2015, [employer]. (2008-05-26 10:23:57)  

## 2015-03-03 ENCOUNTER — Telehealth: Payer: Self-pay | Admitting: *Deleted

## 2015-03-03 NOTE — Telephone Encounter (Addendum)
Patient aware of results ----- Message from Volanda Napoleon, MD sent at 03/03/2015  7:04 AM EDT ----- Call - iron level is ok!!  pete

## 2015-05-03 ENCOUNTER — Ambulatory Visit (HOSPITAL_BASED_OUTPATIENT_CLINIC_OR_DEPARTMENT_OTHER): Payer: BLUE CROSS/BLUE SHIELD | Admitting: Hematology & Oncology

## 2015-05-03 ENCOUNTER — Encounter: Payer: Self-pay | Admitting: Hematology & Oncology

## 2015-05-03 ENCOUNTER — Ambulatory Visit (HOSPITAL_BASED_OUTPATIENT_CLINIC_OR_DEPARTMENT_OTHER): Payer: BLUE CROSS/BLUE SHIELD

## 2015-05-03 ENCOUNTER — Other Ambulatory Visit (HOSPITAL_BASED_OUTPATIENT_CLINIC_OR_DEPARTMENT_OTHER): Payer: BLUE CROSS/BLUE SHIELD

## 2015-05-03 VITALS — BP 146/64 | HR 84 | Temp 98.0°F | Resp 16 | Ht 61.0 in | Wt 129.0 lb

## 2015-05-03 DIAGNOSIS — Z9884 Bariatric surgery status: Secondary | ICD-10-CM

## 2015-05-03 DIAGNOSIS — D631 Anemia in chronic kidney disease: Secondary | ICD-10-CM | POA: Diagnosis not present

## 2015-05-03 DIAGNOSIS — N189 Chronic kidney disease, unspecified: Secondary | ICD-10-CM

## 2015-05-03 DIAGNOSIS — D509 Iron deficiency anemia, unspecified: Secondary | ICD-10-CM

## 2015-05-03 DIAGNOSIS — N183 Chronic kidney disease, stage 3 unspecified: Secondary | ICD-10-CM

## 2015-05-03 LAB — CBC WITH DIFFERENTIAL (CANCER CENTER ONLY)
BASO#: 0 10*3/uL (ref 0.0–0.2)
BASO%: 0.5 % (ref 0.0–2.0)
EOS%: 2.5 % (ref 0.0–7.0)
Eosinophils Absolute: 0.2 10*3/uL (ref 0.0–0.5)
HCT: 33.5 % — ABNORMAL LOW (ref 34.8–46.6)
HGB: 10.5 g/dL — ABNORMAL LOW (ref 11.6–15.9)
LYMPH#: 1.5 10*3/uL (ref 0.9–3.3)
LYMPH%: 23 % (ref 14.0–48.0)
MCH: 30.1 pg (ref 26.0–34.0)
MCHC: 31.3 g/dL — ABNORMAL LOW (ref 32.0–36.0)
MCV: 96 fL (ref 81–101)
MONO#: 0.5 10*3/uL (ref 0.1–0.9)
MONO%: 7.4 % (ref 0.0–13.0)
NEUT#: 4.2 10*3/uL (ref 1.5–6.5)
NEUT%: 66.6 % (ref 39.6–80.0)
Platelets: 208 10*3/uL (ref 145–400)
RBC: 3.49 10*6/uL — ABNORMAL LOW (ref 3.70–5.32)
RDW: 12.6 % (ref 11.1–15.7)
WBC: 6.3 10*3/uL (ref 3.9–10.0)

## 2015-05-03 LAB — COMPREHENSIVE METABOLIC PANEL (CC13)
ALT: 12 U/L (ref 0–55)
AST: 14 U/L (ref 5–34)
Albumin: 3.7 g/dL (ref 3.5–5.0)
Alkaline Phosphatase: 38 U/L — ABNORMAL LOW (ref 40–150)
Anion Gap: 8 mEq/L (ref 3–11)
BUN: 24.1 mg/dL (ref 7.0–26.0)
CO2: 18 mEq/L — ABNORMAL LOW (ref 22–29)
Calcium: 9.4 mg/dL (ref 8.4–10.4)
Chloride: 111 mEq/L — ABNORMAL HIGH (ref 98–109)
Creatinine: 1.2 mg/dL — ABNORMAL HIGH (ref 0.6–1.1)
EGFR: 50 mL/min/{1.73_m2} — ABNORMAL LOW (ref >90)
Glucose: 276 mg/dl — ABNORMAL HIGH (ref 70–140)
Potassium: 5.2 mEq/L — ABNORMAL HIGH (ref 3.5–5.1)
Sodium: 137 mEq/L (ref 136–145)
Total Bilirubin: <0.3 mg/dL (ref 0.20–1.20)
Total Protein: 6.5 g/dL (ref 6.4–8.3)

## 2015-05-03 LAB — FERRITIN CHCC: Ferritin: 611 ng/ml — ABNORMAL HIGH (ref 9–269)

## 2015-05-03 LAB — IRON AND TIBC CHCC
%SAT: 48 % (ref 21–57)
Iron: 107 ug/dL (ref 41–142)
TIBC: 223 ug/dL — ABNORMAL LOW (ref 236–444)
UIBC: 115 ug/dL — ABNORMAL LOW (ref 120–384)

## 2015-05-03 MED ORDER — DARBEPOETIN ALFA 300 MCG/0.6ML IJ SOSY
PREFILLED_SYRINGE | INTRAMUSCULAR | Status: AC
  Filled 2015-05-03: qty 0.6

## 2015-05-03 MED ORDER — DARBEPOETIN ALFA 300 MCG/0.6ML IJ SOSY
300.0000 ug | PREFILLED_SYRINGE | Freq: Once | INTRAMUSCULAR | Status: AC
  Administered 2015-05-03: 300 ug via SUBCUTANEOUS

## 2015-05-03 NOTE — Progress Notes (Signed)
Hematology and Oncology Follow Up Visit  Kelly Soto [id number] 01/20/1956 59 y.o. 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  She is doing okay. She was leg oh from work. She works for [employer]. She worked for them for 27 years. It is no surprise that they let her go. There is actually no loyalty with corporations anymore.  She seems to be doing okay with this. She is happy being at home.  She has 2 new kittens. She is enjoying this.  She last had Aranesp, once ago. We last saw her back in September, her iron studies showed a ferritin of 801. Her iron saturation was 52%.  Her blood sugars are a little on the high side.  She has had no problems with bowels or bladder. She's had no issues with rashes. She's had no fevers. She's had no leg swelling.  Overall, her performance status is ECOG 0.   Medications:  Current outpatient prescriptions:  .  baclofen (LIORESAL) 10 MG tablet, Take 10 mg by mouth as needed. migraines, Disp: , Rfl:  .  BENICAR HCT 40-25 MG per tablet, Take 1 tablet by mouth daily., Disp: , Rfl:  .  Calcium Carbonate-Vitamin D (CALCIUM 500/VITAMIN D PO), Take by mouth 2 (two) times daily. GUMMIES, Disp: , Rfl:  .  clindamycin (CLEOCIN-T) 1 % lotion, Apply topically 2 (two) times daily., Disp: 60 mL, Rfl: 0 .  denosumab (PROLIA) 60 MG/ML SOLN injection, Inject 60 mg into the skin every 6 (six) months. Administer in upper arm, thigh, or abdomen, Disp: , Rfl:  .  diazepam (VALIUM) 5 MG tablet, Take 5 mg by mouth daily as needed., Disp: , Rfl: 0 .  fluticasone (FLONASE) 50 MCG/ACT nasal spray, Place 2 sprays into both nostrils at bedtime., Disp: , Rfl:  .   glyBURIDE-metformin (GLUCOVANCE) 2.5-500 MG per tablet, Take 2 tablets by mouth 2 (two) times daily., Disp: , Rfl:  .  meclizine (ANTIVERT) 25 MG tablet, TAKE 1 TABLET BY MOUTH 3 TIMES A DAY AS NEEDED FOR VERTIGO, Disp: , Rfl: 0 .  omeprazole (PRILOSEC) 20 MG capsule, Take 20 mg by mouth 2 (two) times daily before a meal. , Disp: , Rfl:  .  Pediatric Multiple Vit-C-FA (FLINSTONES GUMMIES OMEGA-3 DHA PO), Take 2 tablets by mouth daily., Disp: , Rfl:  .  promethazine (PHENERGAN) 25 MG tablet, Take 25-50 mg by mouth every 8 (eight) hours as needed (for migraine induced nausea)., Disp: , Rfl:  .  topiramate (TOPAMAX) 25 MG tablet, Take 25-50 mg by mouth 2 (two) times daily. 50 mg in the morning and 25 mg at night, Disp: , Rfl:   Allergies:  Allergies  Allergen Reactions  . Nsaids Anaphylaxis  . Ace Inhibitors     Past Medical History, Surgical history, Social history, and Family History were reviewed and updated.  Review of Systems: As above  Physical Exam:  height is 5\' 1"  (1.549 m) and weight is 129 lb (58.514 kg). Her oral temperature is 98 F (36.7 C). Her blood pressure is 146/64 and her pulse is 84. Her respiration is 16.   Well-developed and well-nourished white female in no obvious distress.  Head and neck exam shows no ocular or oral lesions. She is no palpable cervical or supraclavicular lymph nodes. Lungs are clear with no wheezes or rhonchi. Cardiac exam regular rate and rhythm with no murmurs rubs or bruits. Abdomen is soft. She is good bowel sounds. There is no fluid wave. There is no palpable liver or spleen tip. Back exam shows no tenderness over the spine ribs or hips. Extremities shows no clubbing cyanosis or edema. Skin exam shows a macular type rash on her forearms.. Neurological exam is nonfocal.  Lab Results  Component Value Date   WBC 6.3 05/03/2015   HGB 10.5* 05/03/2015   HCT 33.5* 05/03/2015   MCV 96 05/03/2015   PLT 208 05/03/2015     Chemistry      Component  Value Date/Time   NA 140 03/02/2015 0926   NA 135 07/23/2013 1532   K 5.7* 03/02/2015 0926   K 5.2 07/23/2013 1532   CL 103 03/02/2015 0926   CL 106 07/23/2013 1532   CO2 21 03/02/2015 0926   CO2 22 07/23/2013 1532   BUN 32* 03/02/2015 0926   BUN 21 07/23/2013 1532   CREATININE 1.2 03/02/2015 0926   CREATININE 1.13* 07/23/2013 1532      Component Value Date/Time   CALCIUM 9.6 03/02/2015 0926   CALCIUM 9.5 07/23/2013 1532   ALKPHOS 44 03/02/2015 0926   ALKPHOS 48 07/23/2013 1532   AST 18 03/02/2015 0926   AST 14 07/23/2013 1532   ALT 20 03/02/2015 0926   ALT 15 07/23/2013 1532   BILITOT 0.30 03/02/2015 0926   BILITOT 0.2 07/23/2013 1532         Impression and Plan: Kelly Soto is 59 year old white female. She has diabetes. She has iron deficiency anemia. She has a very low erythropoietin level.  We will go ahead and give her a dose of Aranesp today. We will see what her iron levels show. I think that her iron should be okay just by the MCV.  I think we can probably get her back in 3 months now    Volanda Napoleon, MD 11/7/20168:27 AM

## 2015-05-03 NOTE — Patient Instructions (Signed)
Darbepoetin Alfa injection What is this medicine? DARBEPOETIN ALFA (dar be POE e tin AL fa) helps your body make more red blood cells. It is used to treat anemia caused by chronic kidney failure and chemotherapy. This medicine may be used for other purposes; ask your health care provider or pharmacist if you have questions. What should I tell my health care provider before I take this medicine? They need to know if you have any of these conditions: -blood clotting disorders or history of blood clots -cancer patient not on chemotherapy -cystic fibrosis -heart disease, such as angina, heart failure, or a history of a heart attack -hemoglobin level of 12 g/dL or greater -high blood pressure -low levels of folate, iron, or vitamin B12 -seizures -an unusual or allergic reaction to darbepoetin, erythropoietin, albumin, hamster proteins, latex, other medicines, foods, dyes, or preservatives -pregnant or trying to get pregnant -breast-feeding How should I use this medicine? This medicine is for injection into a vein or under the skin. It is usually given by a health care professional in a hospital or clinic setting. If you get this medicine at home, you will be taught how to prepare and give this medicine. Do not shake the solution before you withdraw a dose. Use exactly as directed. Take your medicine at regular intervals. Do not take your medicine more often than directed. It is important that you put your used needles and syringes in a special sharps container. Do not put them in a trash can. If you do not have a sharps container, call your pharmacist or healthcare provider to get one. Talk to your pediatrician regarding the use of this medicine in children. While this medicine may be used in children as young as 1 year for selected conditions, precautions do apply. Overdosage: If you think you have taken too much of this medicine contact a poison control center or emergency room at once. NOTE:  This medicine is only for you. Do not share this medicine with others. What if I miss a dose? If you miss a dose, take it as soon as you can. If it is almost time for your next dose, take only that dose. Do not take double or extra doses. What may interact with this medicine? Do not take this medicine with any of the following medications: -epoetin alfa This list may not describe all possible interactions. Give your health care provider a list of all the medicines, herbs, non-prescription drugs, or dietary supplements you use. Also tell them if you smoke, drink alcohol, or use illegal drugs. Some items may interact with your medicine. What should I watch for while using this medicine? Visit your prescriber or health care professional for regular checks on your progress and for the needed blood tests and blood pressure measurements. It is especially important for the doctor to make sure your hemoglobin level is in the desired range, to limit the risk of potential side effects and to give you the best benefit. Keep all appointments for any recommended tests. Check your blood pressure as directed. Ask your doctor what your blood pressure should be and when you should contact him or her. As your body makes more red blood cells, you may need to take iron, folic acid, or vitamin B supplements. Ask your doctor or health care provider which products are right for you. If you have kidney disease continue dietary restrictions, even though this medication can make you feel better. Talk with your doctor or health care professional about the   foods you eat and the vitamins that you take. What side effects may I notice from receiving this medicine? Side effects that you should report to your doctor or health care professional as soon as possible: -allergic reactions like skin rash, itching or hives, swelling of the face, lips, or tongue -breathing problems -changes in vision -chest pain -confusion, trouble speaking  or understanding -feeling faint or lightheaded, falls -high blood pressure -muscle aches or pains -pain, swelling, warmth in the leg -rapid weight gain -severe headaches -sudden numbness or weakness of the face, arm or leg -trouble walking, dizziness, loss of balance or coordination -seizures (convulsions) -swelling of the ankles, feet, hands -unusually weak or tired Side effects that usually do not require medical attention (report to your doctor or health care professional if they continue or are bothersome): -diarrhea -fever, chills (flu-like symptoms) -headaches -nausea, vomiting -redness, stinging, or swelling at site where injected This list may not describe all possible side effects. Call your doctor for medical advice about side effects. You may report side effects to FDA at 1-800-FDA-1088. Where should I keep my medicine? Keep out of the reach of children. Store in a refrigerator between 2 and 8 degrees C (36 and 46 degrees F). Do not freeze. Do not shake. Throw away any unused portion if using a single-dose vial. Throw away any unused medicine after the expiration date. NOTE: This sheet is a summary. It may not cover all possible information. If you have questions about this medicine, talk to your doctor, pharmacist, or health care provider.    2016, Elsevier/Gold Standard. (2008-05-26 10:23:57)  

## 2015-08-03 ENCOUNTER — Other Ambulatory Visit: Payer: BLUE CROSS/BLUE SHIELD

## 2015-08-03 ENCOUNTER — Ambulatory Visit: Payer: BLUE CROSS/BLUE SHIELD

## 2015-08-03 ENCOUNTER — Ambulatory Visit: Payer: BLUE CROSS/BLUE SHIELD | Admitting: Hematology & Oncology

## 2015-08-04 ENCOUNTER — Ambulatory Visit (HOSPITAL_BASED_OUTPATIENT_CLINIC_OR_DEPARTMENT_OTHER): Payer: BLUE CROSS/BLUE SHIELD | Admitting: Hematology & Oncology

## 2015-08-04 ENCOUNTER — Encounter: Payer: Self-pay | Admitting: Hematology & Oncology

## 2015-08-04 ENCOUNTER — Ambulatory Visit (HOSPITAL_BASED_OUTPATIENT_CLINIC_OR_DEPARTMENT_OTHER): Payer: BLUE CROSS/BLUE SHIELD

## 2015-08-04 ENCOUNTER — Other Ambulatory Visit (HOSPITAL_BASED_OUTPATIENT_CLINIC_OR_DEPARTMENT_OTHER): Payer: BLUE CROSS/BLUE SHIELD

## 2015-08-04 VITALS — BP 147/71 | HR 80 | Temp 97.9°F | Resp 16 | Ht 61.0 in | Wt 126.0 lb

## 2015-08-04 DIAGNOSIS — N189 Chronic kidney disease, unspecified: Secondary | ICD-10-CM

## 2015-08-04 DIAGNOSIS — D631 Anemia in chronic kidney disease: Secondary | ICD-10-CM | POA: Diagnosis not present

## 2015-08-04 DIAGNOSIS — N183 Chronic kidney disease, stage 3 unspecified: Secondary | ICD-10-CM

## 2015-08-04 DIAGNOSIS — D509 Iron deficiency anemia, unspecified: Secondary | ICD-10-CM | POA: Diagnosis not present

## 2015-08-04 DIAGNOSIS — E119 Type 2 diabetes mellitus without complications: Secondary | ICD-10-CM | POA: Diagnosis not present

## 2015-08-04 LAB — COMPREHENSIVE METABOLIC PANEL
ALT: 12 U/L (ref 0–55)
AST: 12 U/L (ref 5–34)
Albumin: 3.8 g/dL (ref 3.5–5.0)
Alkaline Phosphatase: 41 U/L (ref 40–150)
Anion Gap: 8 mEq/L (ref 3–11)
BUN: 36.7 mg/dL — ABNORMAL HIGH (ref 7.0–26.0)
CO2: 19 mEq/L — ABNORMAL LOW (ref 22–29)
Calcium: 9.7 mg/dL (ref 8.4–10.4)
Chloride: 110 mEq/L — ABNORMAL HIGH (ref 98–109)
Creatinine: 1.4 mg/dL — ABNORMAL HIGH (ref 0.6–1.1)
EGFR: 40 mL/min/{1.73_m2} — ABNORMAL LOW (ref >90)
Glucose: 277 mg/dl — ABNORMAL HIGH (ref 70–140)
Potassium: 5.3 mEq/L — ABNORMAL HIGH (ref 3.5–5.1)
Sodium: 137 mEq/L (ref 136–145)
Total Bilirubin: 0.35 mg/dL (ref 0.20–1.20)
Total Protein: 6.9 g/dL (ref 6.4–8.3)

## 2015-08-04 LAB — CBC WITH DIFFERENTIAL (CANCER CENTER ONLY)
BASO#: 0 10*3/uL (ref 0.0–0.2)
BASO%: 0.3 % (ref 0.0–2.0)
EOS%: 1.4 % (ref 0.0–7.0)
Eosinophils Absolute: 0.1 10*3/uL (ref 0.0–0.5)
HCT: 32.6 % — ABNORMAL LOW (ref 34.8–46.6)
HGB: 10.4 g/dL — ABNORMAL LOW (ref 11.6–15.9)
LYMPH#: 2 10*3/uL (ref 0.9–3.3)
LYMPH%: 26.3 % (ref 14.0–48.0)
MCH: 28.7 pg (ref 26.0–34.0)
MCHC: 31.9 g/dL — ABNORMAL LOW (ref 32.0–36.0)
MCV: 90 fL (ref 81–101)
MONO#: 0.6 10*3/uL (ref 0.1–0.9)
MONO%: 8 % (ref 0.0–13.0)
NEUT#: 5 10*3/uL (ref 1.5–6.5)
NEUT%: 64 % (ref 39.6–80.0)
Platelets: 212 10*3/uL (ref 145–400)
RBC: 3.63 10*6/uL — ABNORMAL LOW (ref 3.70–5.32)
RDW: 14.2 % (ref 11.1–15.7)
WBC: 7.8 10*3/uL (ref 3.9–10.0)

## 2015-08-04 LAB — IRON AND TIBC
%SAT: 71 % — ABNORMAL HIGH (ref 21–57)
Iron: 149 ug/dL — ABNORMAL HIGH (ref 41–142)
TIBC: 210 ug/dL — ABNORMAL LOW (ref 236–444)
UIBC: 61 ug/dL — ABNORMAL LOW (ref 120–384)

## 2015-08-04 LAB — FERRITIN: Ferritin: 783 ng/ml — ABNORMAL HIGH (ref 9–269)

## 2015-08-04 MED ORDER — DARBEPOETIN ALFA 300 MCG/0.6ML IJ SOSY
PREFILLED_SYRINGE | INTRAMUSCULAR | Status: AC
  Filled 2015-08-04: qty 0.6

## 2015-08-04 MED ORDER — DARBEPOETIN ALFA 300 MCG/0.6ML IJ SOSY
300.0000 ug | PREFILLED_SYRINGE | Freq: Once | INTRAMUSCULAR | Status: AC
  Administered 2015-08-04: 300 ug via SUBCUTANEOUS

## 2015-08-04 NOTE — Patient Instructions (Signed)
Darbepoetin Alfa injection What is this medicine? DARBEPOETIN ALFA (dar be POE e tin AL fa) helps your body make more red blood cells. It is used to treat anemia caused by chronic kidney failure and chemotherapy. This medicine may be used for other purposes; ask your health care provider or pharmacist if you have questions. What should I tell my health care provider before I take this medicine? They need to know if you have any of these conditions: -blood clotting disorders or history of blood clots -cancer patient not on chemotherapy -cystic fibrosis -heart disease, such as angina, heart failure, or a history of a heart attack -hemoglobin level of 12 g/dL or greater -high blood pressure -low levels of folate, iron, or vitamin B12 -seizures -an unusual or allergic reaction to darbepoetin, erythropoietin, albumin, hamster proteins, latex, other medicines, foods, dyes, or preservatives -pregnant or trying to get pregnant -breast-feeding How should I use this medicine? This medicine is for injection into a vein or under the skin. It is usually given by a health care professional in a hospital or clinic setting. If you get this medicine at home, you will be taught how to prepare and give this medicine. Do not shake the solution before you withdraw a dose. Use exactly as directed. Take your medicine at regular intervals. Do not take your medicine more often than directed. It is important that you put your used needles and syringes in a special sharps container. Do not put them in a trash can. If you do not have a sharps container, call your pharmacist or healthcare provider to get one. Talk to your pediatrician regarding the use of this medicine in children. While this medicine may be used in children as young as 1 year for selected conditions, precautions do apply. Overdosage: If you think you have taken too much of this medicine contact a poison control center or emergency room at once. NOTE:  This medicine is only for you. Do not share this medicine with others. What if I miss a dose? If you miss a dose, take it as soon as you can. If it is almost time for your next dose, take only that dose. Do not take double or extra doses. What may interact with this medicine? Do not take this medicine with any of the following medications: -epoetin alfa This list may not describe all possible interactions. Give your health care provider a list of all the medicines, herbs, non-prescription drugs, or dietary supplements you use. Also tell them if you smoke, drink alcohol, or use illegal drugs. Some items may interact with your medicine. What should I watch for while using this medicine? Visit your prescriber or health care professional for regular checks on your progress and for the needed blood tests and blood pressure measurements. It is especially important for the doctor to make sure your hemoglobin level is in the desired range, to limit the risk of potential side effects and to give you the best benefit. Keep all appointments for any recommended tests. Check your blood pressure as directed. Ask your doctor what your blood pressure should be and when you should contact him or her. As your body makes more red blood cells, you may need to take iron, folic acid, or vitamin B supplements. Ask your doctor or health care provider which products are right for you. If you have kidney disease continue dietary restrictions, even though this medication can make you feel better. Talk with your doctor or health care professional about the   foods you eat and the vitamins that you take. What side effects may I notice from receiving this medicine? Side effects that you should report to your doctor or health care professional as soon as possible: -allergic reactions like skin rash, itching or hives, swelling of the face, lips, or tongue -breathing problems -changes in vision -chest pain -confusion, trouble speaking  or understanding -feeling faint or lightheaded, falls -high blood pressure -muscle aches or pains -pain, swelling, warmth in the leg -rapid weight gain -severe headaches -sudden numbness or weakness of the face, arm or leg -trouble walking, dizziness, loss of balance or coordination -seizures (convulsions) -swelling of the ankles, feet, hands -unusually weak or tired Side effects that usually do not require medical attention (report to your doctor or health care professional if they continue or are bothersome): -diarrhea -fever, chills (flu-like symptoms) -headaches -nausea, vomiting -redness, stinging, or swelling at site where injected This list may not describe all possible side effects. Call your doctor for medical advice about side effects. You may report side effects to FDA at 1-800-FDA-1088. Where should I keep my medicine? Keep out of the reach of children. Store in a refrigerator between 2 and 8 degrees C (36 and 46 degrees F). Do not freeze. Do not shake. Throw away any unused portion if using a single-dose vial. Throw away any unused medicine after the expiration date. NOTE: This sheet is a summary. It may not cover all possible information. If you have questions about this medicine, talk to your doctor, pharmacist, or health care provider.    2016, Elsevier/Gold Standard. (2008-05-26 10:23:57)  

## 2015-08-04 NOTE — Progress Notes (Signed)
Hematology and Oncology Follow Up Visit  Kelly Soto [id number] 28-Apr-1956 60 y.o. 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  Kelly Soto is a back for followup.  She is doing okay. She actually is enjoying her retirement. She gets 18 months of severance pay. This is really generous for her. I'm glad that [facility] is doing this for her.  She's had no problems with bleeding or bruising. She really does not watch her diet all that much. Her last hemoglobin A1c was 7.3.  She's had no change in bowel or bladder habits. She had no leg swelling. She's had no cough or shortness of breath.  She is doing a lot of walking.  Her last iron studies done back in November showed a ferritin 611 with an iron saturation of 48%.  Overall, her performance status is ECOG 0.   Medications:  Current outpatient prescriptions:  .  baclofen (LIORESAL) 10 MG tablet, Take 10 mg by mouth as needed. migraines, Disp: , Rfl:  .  BENICAR HCT 40-25 MG per tablet, Take 1 tablet by mouth daily., Disp: , Rfl:  .  Calcium Carbonate-Vitamin D (CALCIUM 500/VITAMIN D PO), Take by mouth 2 (two) times daily. GUMMIES, Disp: , Rfl:  .  clindamycin (CLEOCIN-T) 1 % lotion, Apply topically 2 (two) times daily., Disp: 60 mL, Rfl: 0 .  denosumab (PROLIA) 60 MG/ML SOLN injection, Inject 60 mg into the skin every 6 (six) months. Administer in upper arm, thigh, or abdomen, Disp: , Rfl:  .  diazepam (VALIUM) 5 MG tablet, Take 5 mg by mouth daily as needed., Disp: , Rfl: 0 .  fluticasone (FLONASE) 50 MCG/ACT nasal spray, Place 2 sprays into both nostrils at bedtime., Disp: , Rfl:  .  glyBURIDE-metformin (GLUCOVANCE) 2.5-500 MG per tablet, Take 2 tablets by mouth 2 (two)  times daily., Disp: , Rfl:  .  meclizine (ANTIVERT) 25 MG tablet, TAKE 1 TABLET BY MOUTH 3 TIMES A DAY AS NEEDED FOR VERTIGO, Disp: , Rfl: 0 .  omeprazole (PRILOSEC) 20 MG capsule, Take 20 mg by mouth 2 (two) times daily before a meal. , Disp: , Rfl:  .  Pediatric Multiple Vit-C-FA (FLINSTONES GUMMIES OMEGA-3 DHA PO), Take 2 tablets by mouth daily., Disp: , Rfl:  .  promethazine (PHENERGAN) 25 MG tablet, Take 25-50 mg by mouth every 8 (eight) hours as needed (for migraine induced nausea)., Disp: , Rfl:  .  topiramate (TOPAMAX) 25 MG tablet, Take 25-50 mg by mouth 2 (two) times daily. 50 mg in the morning and 25 mg at night, Disp: , Rfl:   Allergies:  Allergies  Allergen Reactions  . Nsaids Anaphylaxis  . Ace Inhibitors     Past Medical History, Surgical history, Social history, and Family History were reviewed and updated.  Review of Systems: As above  Physical Exam:  height is 5\' 1"  (1.549 m) and weight is 126 lb (57.153 kg). Her oral temperature is 97.9 F (36.6 C). Her blood pressure is 147/71 and her pulse is 80. Her respiration is 16.   Well-developed and well-nourished white female in no obvious distress. Head and neck exam shows no ocular or oral lesions. She is no palpable cervical or supraclavicular lymph nodes.  Lungs are clear with no wheezes or rhonchi. Cardiac exam regular rate and rhythm with no murmurs rubs or bruits. Abdomen is soft. She is good bowel sounds. There is no fluid wave. There is no palpable liver or spleen tip. Back exam shows no tenderness over the spine ribs or hips. Extremities shows no clubbing cyanosis or edema. Skin exam shows a macular type rash on her forearms.. Neurological exam is nonfocal.  Lab Results  Component Value Date   WBC 7.8 08/04/2015   HGB 10.4* 08/04/2015   HCT 32.6* 08/04/2015   MCV 90 08/04/2015   PLT 212 08/04/2015     Chemistry      Component Value Date/Time   NA 137 05/03/2015 0745   NA 140 03/02/2015 0926   NA 135  07/23/2013 1532   K 5.2* 05/03/2015 0745   K 5.7* 03/02/2015 0926   K 5.2 07/23/2013 1532   CL 103 03/02/2015 0926   CL 106 07/23/2013 1532   CO2 18* 05/03/2015 0745   CO2 21 03/02/2015 0926   CO2 22 07/23/2013 1532   BUN 24.1 05/03/2015 0745   BUN 32* 03/02/2015 0926   BUN 21 07/23/2013 1532   CREATININE 1.2* 05/03/2015 0745   CREATININE 1.2 03/02/2015 0926   CREATININE 1.13* 07/23/2013 1532      Component Value Date/Time   CALCIUM 9.4 05/03/2015 0745   CALCIUM 9.6 03/02/2015 0926   CALCIUM 9.5 07/23/2013 1532   ALKPHOS 38* 05/03/2015 0745   ALKPHOS 44 03/02/2015 0926   ALKPHOS 48 07/23/2013 1532   AST 14 05/03/2015 0745   AST 18 03/02/2015 0926   AST 14 07/23/2013 1532   ALT 12 05/03/2015 0745   ALT 20 03/02/2015 0926   ALT 15 07/23/2013 1532   BILITOT <0.30 05/03/2015 0745   BILITOT 0.30 03/02/2015 0926   BILITOT 0.2 07/23/2013 1532         Impression and Plan: [name] is 60 year old white female. She has diabetes. She has iron deficiency anemia. She has a very low erythropoietin level.  We will go ahead and give her a dose of Aranesp today. We will see what her iron levels show. I think that her iron should be okay just by the MCV.  I think we can probably get her back in 3 months now    Volanda Napoleon, MD 2/8/20178:25 AM

## 2015-08-05 ENCOUNTER — Telehealth: Payer: Self-pay | Admitting: *Deleted

## 2015-08-05 LAB — RETICULOCYTES: Reticulocyte Count: 0.6 % (ref 0.6–2.6)

## 2015-08-05 NOTE — Telephone Encounter (Signed)
-----   Message from Volanda Napoleon, MD sent at 08/04/2015 12:46 PM EST ----- Call - iron level is ok!!  Your blood sugar is horrible!  If this is NOT improved, your kidneys will have issues and your blood will never be normal!!  pete

## 2015-08-05 NOTE — Telephone Encounter (Signed)
iron level is ok!! Your blood sugar is horrible! If this is NOT improved, your kidneys will have issues and your blood will never be normal!! Called this message to patient

## 2015-08-16 ENCOUNTER — Other Ambulatory Visit: Payer: Self-pay | Admitting: Family Medicine

## 2015-08-16 DIAGNOSIS — N644 Mastodynia: Secondary | ICD-10-CM

## 2015-08-19 ENCOUNTER — Ambulatory Visit
Admission: RE | Admit: 2015-08-19 | Discharge: 2015-08-19 | Disposition: A | Discharge status: Home / Self Care | Payer: BLUE CROSS/BLUE SHIELD | Source: Ambulatory Visit | Attending: Family Medicine | Admitting: Family Medicine

## 2015-08-19 DIAGNOSIS — N644 Mastodynia: Secondary | ICD-10-CM

## 2015-11-01 ENCOUNTER — Ambulatory Visit (HOSPITAL_BASED_OUTPATIENT_CLINIC_OR_DEPARTMENT_OTHER): Payer: BLUE CROSS/BLUE SHIELD

## 2015-11-01 ENCOUNTER — Other Ambulatory Visit (HOSPITAL_BASED_OUTPATIENT_CLINIC_OR_DEPARTMENT_OTHER): Payer: BLUE CROSS/BLUE SHIELD

## 2015-11-01 ENCOUNTER — Telehealth: Payer: Self-pay | Admitting: *Deleted

## 2015-11-01 ENCOUNTER — Encounter: Payer: Self-pay | Admitting: Hematology & Oncology

## 2015-11-01 ENCOUNTER — Ambulatory Visit (HOSPITAL_BASED_OUTPATIENT_CLINIC_OR_DEPARTMENT_OTHER): Payer: BLUE CROSS/BLUE SHIELD | Admitting: Hematology & Oncology

## 2015-11-01 VITALS — BP 138/71 | HR 76 | Temp 97.7°F | Resp 18 | Ht 61.0 in | Wt 126.0 lb

## 2015-11-01 DIAGNOSIS — D631 Anemia in chronic kidney disease: Secondary | ICD-10-CM

## 2015-11-01 DIAGNOSIS — D509 Iron deficiency anemia, unspecified: Secondary | ICD-10-CM | POA: Diagnosis not present

## 2015-11-01 DIAGNOSIS — N189 Chronic kidney disease, unspecified: Secondary | ICD-10-CM

## 2015-11-01 DIAGNOSIS — E119 Type 2 diabetes mellitus without complications: Secondary | ICD-10-CM | POA: Diagnosis not present

## 2015-11-01 LAB — IRON AND TIBC
%SAT: 41 % (ref 21–57)
Iron: 93 ug/dL (ref 41–142)
TIBC: 230 ug/dL — ABNORMAL LOW (ref 236–444)
UIBC: 137 ug/dL (ref 120–384)

## 2015-11-01 LAB — COMPREHENSIVE METABOLIC PANEL
ALT: 18 U/L (ref 0–55)
AST: 15 U/L (ref 5–34)
Albumin: 3.6 g/dL (ref 3.5–5.0)
Alkaline Phosphatase: 39 U/L — ABNORMAL LOW (ref 40–150)
Anion Gap: 8 mEq/L (ref 3–11)
BUN: 35.3 mg/dL — ABNORMAL HIGH (ref 7.0–26.0)
CO2: 20 mEq/L — ABNORMAL LOW (ref 22–29)
Calcium: 9.1 mg/dL (ref 8.4–10.4)
Chloride: 110 mEq/L — ABNORMAL HIGH (ref 98–109)
Creatinine: 1.3 mg/dL — ABNORMAL HIGH (ref 0.6–1.1)
EGFR: 43 mL/min/{1.73_m2} — ABNORMAL LOW (ref >90)
Glucose: 263 mg/dl — ABNORMAL HIGH (ref 70–140)
Potassium: 5 mEq/L (ref 3.5–5.1)
Sodium: 138 mEq/L (ref 136–145)
Total Bilirubin: <0.3 mg/dL (ref 0.20–1.20)
Total Protein: 6.4 g/dL (ref 6.4–8.3)

## 2015-11-01 LAB — CBC WITH DIFFERENTIAL (CANCER CENTER ONLY)
BASO#: 0 10*3/uL (ref 0.0–0.2)
BASO%: 0.5 % (ref 0.0–2.0)
EOS%: 1.3 % (ref 0.0–7.0)
Eosinophils Absolute: 0.1 10*3/uL (ref 0.0–0.5)
HCT: 29.5 % — ABNORMAL LOW (ref 34.8–46.6)
HGB: 9.4 g/dL — ABNORMAL LOW (ref 11.6–15.9)
LYMPH#: 1.8 10*3/uL (ref 0.9–3.3)
LYMPH%: 23.9 % (ref 14.0–48.0)
MCH: 30.4 pg (ref 26.0–34.0)
MCHC: 31.9 g/dL — ABNORMAL LOW (ref 32.0–36.0)
MCV: 96 fL (ref 81–101)
MONO#: 0.6 10*3/uL (ref 0.1–0.9)
MONO%: 7.6 % (ref 0.0–13.0)
NEUT#: 5 10*3/uL (ref 1.5–6.5)
NEUT%: 66.7 % (ref 39.6–80.0)
Platelets: 209 10*3/uL (ref 145–400)
RBC: 3.09 10*6/uL — ABNORMAL LOW (ref 3.70–5.32)
RDW: 13.4 % (ref 11.1–15.7)
WBC: 7.5 10*3/uL (ref 3.9–10.0)

## 2015-11-01 LAB — FERRITIN: Ferritin: 646 ng/ml — ABNORMAL HIGH (ref 9–269)

## 2015-11-01 MED ORDER — DARBEPOETIN ALFA 300 MCG/0.6ML IJ SOSY
300.0000 ug | PREFILLED_SYRINGE | Freq: Once | INTRAMUSCULAR | Status: AC
  Administered 2015-11-01: 300 ug via SUBCUTANEOUS

## 2015-11-01 MED ORDER — DARBEPOETIN ALFA 300 MCG/0.6ML IJ SOSY
PREFILLED_SYRINGE | INTRAMUSCULAR | Status: AC
  Filled 2015-11-01: qty 0.6

## 2015-11-01 NOTE — Patient Instructions (Signed)
Darbepoetin Alfa injection What is this medicine? DARBEPOETIN ALFA (dar be POE e tin AL fa) helps your body make more red blood cells. It is used to treat anemia caused by chronic kidney failure and chemotherapy. This medicine may be used for other purposes; ask your health care provider or pharmacist if you have questions. What should I tell my health care provider before I take this medicine? They need to know if you have any of these conditions: -blood clotting disorders or history of blood clots -cancer patient not on chemotherapy -cystic fibrosis -heart disease, such as angina, heart failure, or a history of a heart attack -hemoglobin level of 12 g/dL or greater -high blood pressure -low levels of folate, iron, or vitamin B12 -seizures -an unusual or allergic reaction to darbepoetin, erythropoietin, albumin, hamster proteins, latex, other medicines, foods, dyes, or preservatives -pregnant or trying to get pregnant -breast-feeding How should I use this medicine? This medicine is for injection into a vein or under the skin. It is usually given by a health care professional in a hospital or clinic setting. If you get this medicine at home, you will be taught how to prepare and give this medicine. Do not shake the solution before you withdraw a dose. Use exactly as directed. Take your medicine at regular intervals. Do not take your medicine more often than directed. It is important that you put your used needles and syringes in a special sharps container. Do not put them in a trash can. If you do not have a sharps container, call your pharmacist or healthcare provider to get one. Talk to your pediatrician regarding the use of this medicine in children. While this medicine may be used in children as young as 1 year for selected conditions, precautions do apply. Overdosage: If you think you have taken too much of this medicine contact a poison control center or emergency room at once. NOTE:  This medicine is only for you. Do not share this medicine with others. What if I miss a dose? If you miss a dose, take it as soon as you can. If it is almost time for your next dose, take only that dose. Do not take double or extra doses. What may interact with this medicine? Do not take this medicine with any of the following medications: -epoetin alfa This list may not describe all possible interactions. Give your health care provider a list of all the medicines, herbs, non-prescription drugs, or dietary supplements you use. Also tell them if you smoke, drink alcohol, or use illegal drugs. Some items may interact with your medicine. What should I watch for while using this medicine? Visit your prescriber or health care professional for regular checks on your progress and for the needed blood tests and blood pressure measurements. It is especially important for the doctor to make sure your hemoglobin level is in the desired range, to limit the risk of potential side effects and to give you the best benefit. Keep all appointments for any recommended tests. Check your blood pressure as directed. Ask your doctor what your blood pressure should be and when you should contact him or her. As your body makes more red blood cells, you may need to take iron, folic acid, or vitamin B supplements. Ask your doctor or health care provider which products are right for you. If you have kidney disease continue dietary restrictions, even though this medication can make you feel better. Talk with your doctor or health care professional about the   foods you eat and the vitamins that you take. What side effects may I notice from receiving this medicine? Side effects that you should report to your doctor or health care professional as soon as possible: -allergic reactions like skin rash, itching or hives, swelling of the face, lips, or tongue -breathing problems -changes in vision -chest pain -confusion, trouble speaking  or understanding -feeling faint or lightheaded, falls -high blood pressure -muscle aches or pains -pain, swelling, warmth in the leg -rapid weight gain -severe headaches -sudden numbness or weakness of the face, arm or leg -trouble walking, dizziness, loss of balance or coordination -seizures (convulsions) -swelling of the ankles, feet, hands -unusually weak or tired Side effects that usually do not require medical attention (report to your doctor or health care professional if they continue or are bothersome): -diarrhea -fever, chills (flu-like symptoms) -headaches -nausea, vomiting -redness, stinging, or swelling at site where injected This list may not describe all possible side effects. Call your doctor for medical advice about side effects. You may report side effects to FDA at 1-800-FDA-1088. Where should I keep my medicine? Keep out of the reach of children. Store in a refrigerator between 2 and 8 degrees C (36 and 46 degrees F). Do not freeze. Do not shake. Throw away any unused portion if using a single-dose vial. Throw away any unused medicine after the expiration date. NOTE: This sheet is a summary. It may not cover all possible information. If you have questions about this medicine, talk to your doctor, pharmacist, or health care provider.    2016, Elsevier/Gold Standard. (2008-05-26 10:23:57)  

## 2015-11-01 NOTE — Telephone Encounter (Addendum)
Patient aware of results.   ----- Message from Volanda Napoleon, MD sent at 11/01/2015 12:02 PM EDT ----- Call - iron level is ok!! pete

## 2015-11-01 NOTE — Progress Notes (Signed)
Hematology and Oncology Follow Up Visit  Kelly Soto [id number] [birth year] 60 y.o. 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 is a back for followup.  She is feeling tired. She says that she knows what happened. This is that it happened April 21. She was grocery shopping. She then felt very fatigued. Since then, she's felt tired.  We have not seen her but in 3 months. As such, it would not surprise me if her iron is also on the low side.   When last checked her iron studies back in February, her ferritin was 783 with an iron saturation of 71%. It has been probably a year that we've given her any iron.  She was responds to Aranesp. It is possible that we may have to increase the frequency that we see her.   She's had no bleeding. She is enjoying retirement. She is going start a new business.   She's had no fever. She had no cough.   I think her diabetes has been little more difficult to control. She has a new medication for this.   Overall, her performance status is ECOG 1.     Medications:  Current outpatient prescriptions:  .  baclofen (LIORESAL) 10 MG tablet, Take 10 mg by mouth as needed. migraines, Disp: , Rfl:  .  BENICAR HCT 40-25 MG per tablet, Take 1 tablet by mouth daily., Disp: , Rfl:  .  Calcium Carbonate-Vitamin D (CALCIUM 500/VITAMIN D PO), Take by mouth 2 (two) times daily. GUMMIES, Disp: , Rfl:  .  clindamycin (CLEOCIN-T) 1 % lotion, Apply topically 2 (two) times daily., Disp: 60 mL, Rfl: 0 .  denosumab (PROLIA) 60 MG/ML SOLN injection, Inject 60 mg into the skin every 6 (six) months. Administer in upper arm, thigh, or abdomen, Disp: , Rfl:  .  diazepam (VALIUM) 5 MG tablet, Take 5 mg by mouth  daily as needed., Disp: , Rfl: 0 .  fluticasone (FLONASE) 50 MCG/ACT nasal spray, Place 2 sprays into both nostrils at bedtime., Disp: , Rfl:  .  glyBURIDE-metformin (GLUCOVANCE) 2.5-500 MG per tablet, Take 2 tablets by mouth 2 (two) times daily., Disp: , Rfl:  .  JARDIANCE 25 MG TABS tablet, Take 25 mg by mouth every morning., Disp: , Rfl: 3 .  meclizine (ANTIVERT) 25 MG tablet, TAKE 1 TABLET BY MOUTH 3 TIMES A DAY AS NEEDED FOR VERTIGO, Disp: , Rfl: 0 .  omeprazole (PRILOSEC) 20 MG capsule, Take 20 mg by mouth 2 (two) times daily before a meal. , Disp: , Rfl:  .  Pediatric Multiple Vit-C-FA (FLINSTONES GUMMIES OMEGA-3 DHA PO), Take 2 tablets by mouth daily., Disp: , Rfl:  .  promethazine (PHENERGAN) 25 MG tablet, Take 25-50 mg by mouth every 8 (eight) hours as needed (for migraine induced nausea)., Disp: , Rfl:  .  topiramate (TOPAMAX) 25 MG tablet, Take 25-50 mg by mouth 2 (two) times daily. 50 mg in the morning and 25 mg at night, Disp: , Rfl:   Allergies:  Allergies  Allergen Reactions  . Nsaids Anaphylaxis  . Ace Inhibitors     Past Medical History, Surgical history, Social history, and Family History were reviewed and updated.  Review of Systems:  As above  Physical Exam:  height is 5\' 1"  (1.549 m) and weight is 126 lb (57.153 kg). Her oral temperature is 97.7 F (36.5 C). Her blood pressure is 138/71 and her pulse is 76. Her respiration is 18.   Well-developed and well-nourished white female in no obvious distress. Head and neck exam shows no ocular or oral lesions. She is no palpable cervical or supraclavicular lymph nodes. Lungs are clear with no wheezes or rhonchi. Cardiac exam regular rate and rhythm with no murmurs rubs or bruits. Abdomen is soft. She is good bowel sounds. There is no fluid wave. There is no palpable liver or spleen tip. Back exam shows no tenderness over the spine ribs or hips. Extremities shows no clubbing cyanosis or edema. Skin exam shows a macular type rash  on her forearms.. Neurological exam is nonfocal.  Lab Results  Component Value Date   WBC 7.5 11/01/2015   HGB 9.4* 11/01/2015   HCT 29.5* 11/01/2015   MCV 96 11/01/2015   PLT 209 11/01/2015     Chemistry      Component Value Date/Time   NA 137 08/04/2015 0758   NA 140 03/02/2015 0926   NA 135 07/23/2013 1532   K 5.3* 08/04/2015 0758   K 5.7* 03/02/2015 0926   K 5.2 07/23/2013 1532   CL 103 03/02/2015 0926   CL 106 07/23/2013 1532   CO2 19* 08/04/2015 0758   CO2 21 03/02/2015 0926   CO2 22 07/23/2013 1532   BUN 36.7* 08/04/2015 0758   BUN 32* 03/02/2015 0926   BUN 21 07/23/2013 1532   CREATININE 1.4* 08/04/2015 0758   CREATININE 1.2 03/02/2015 0926   CREATININE 1.13* 07/23/2013 1532      Component Value Date/Time   CALCIUM 9.7 08/04/2015 0758   CALCIUM 9.6 03/02/2015 0926   CALCIUM 9.5 07/23/2013 1532   ALKPHOS 41 08/04/2015 0758   ALKPHOS 44 03/02/2015 0926   ALKPHOS 48 07/23/2013 1532   AST 12 08/04/2015 0758   AST 18 03/02/2015 0926   AST 14 07/23/2013 1532   ALT 12 08/04/2015 0758   ALT 20 03/02/2015 0926   ALT 15 07/23/2013 1532   BILITOT 0.35 08/04/2015 0758   BILITOT 0.30 03/02/2015 0926   BILITOT 0.2 07/23/2013 1532         Impression and Plan: Kelly Soto is 60 year old white female. She has diabetes. She has iron deficiency anemia. She has a very low erythropoietin level.  We will go ahead and give her a dose of Aranesp today. We will see what her iron levels show. I think that her iron should be okay just by the MCV.  I think we can probably get her back in 2 months now    Volanda Napoleon, MD 5/8/20178:27 AM

## 2015-11-02 LAB — RETICULOCYTES: Reticulocyte Count: 1 % (ref 0.6–2.6)

## 2015-11-08 ENCOUNTER — Other Ambulatory Visit: Payer: Self-pay | Admitting: *Deleted

## 2015-11-08 MED ORDER — OMEPRAZOLE 20 MG PO CPDR: 20.0000 mg | DELAYED_RELEASE_CAPSULE | Freq: Two times a day (BID) | ORAL | Status: DC

## 2016-01-13 ENCOUNTER — Other Ambulatory Visit (HOSPITAL_BASED_OUTPATIENT_CLINIC_OR_DEPARTMENT_OTHER): Payer: BLUE CROSS/BLUE SHIELD

## 2016-01-13 ENCOUNTER — Ambulatory Visit (HOSPITAL_BASED_OUTPATIENT_CLINIC_OR_DEPARTMENT_OTHER): Payer: BLUE CROSS/BLUE SHIELD | Admitting: Hematology & Oncology

## 2016-01-13 ENCOUNTER — Encounter: Payer: Self-pay | Admitting: Hematology & Oncology

## 2016-01-13 ENCOUNTER — Ambulatory Visit (HOSPITAL_BASED_OUTPATIENT_CLINIC_OR_DEPARTMENT_OTHER): Payer: BLUE CROSS/BLUE SHIELD

## 2016-01-13 VITALS — BP 135/69 | HR 76 | Temp 97.5°F | Resp 18 | Ht 61.0 in | Wt 122.0 lb

## 2016-01-13 DIAGNOSIS — D509 Iron deficiency anemia, unspecified: Secondary | ICD-10-CM

## 2016-01-13 DIAGNOSIS — N189 Chronic kidney disease, unspecified: Secondary | ICD-10-CM

## 2016-01-13 DIAGNOSIS — N183 Chronic kidney disease, stage 3 unspecified: Secondary | ICD-10-CM

## 2016-01-13 DIAGNOSIS — D631 Anemia in chronic kidney disease: Secondary | ICD-10-CM | POA: Diagnosis not present

## 2016-01-13 DIAGNOSIS — E119 Type 2 diabetes mellitus without complications: Secondary | ICD-10-CM | POA: Diagnosis not present

## 2016-01-13 LAB — COMPREHENSIVE METABOLIC PANEL
ALT: 15 U/L (ref 0–55)
AST: 13 U/L (ref 5–34)
Albumin: 3.4 g/dL — ABNORMAL LOW (ref 3.5–5.0)
Alkaline Phosphatase: 45 U/L (ref 40–150)
Anion Gap: 8 mEq/L (ref 3–11)
BUN: 32.9 mg/dL — ABNORMAL HIGH (ref 7.0–26.0)
CO2: 20 mEq/L — ABNORMAL LOW (ref 22–29)
Calcium: 9.1 mg/dL (ref 8.4–10.4)
Chloride: 108 mEq/L (ref 98–109)
Creatinine: 1.3 mg/dL — ABNORMAL HIGH (ref 0.6–1.1)
EGFR: 43 mL/min/{1.73_m2} — ABNORMAL LOW (ref >90)
Glucose: 283 mg/dl — ABNORMAL HIGH (ref 70–140)
Potassium: 5.7 mEq/L — ABNORMAL HIGH (ref 3.5–5.1)
Sodium: 137 mEq/L (ref 136–145)
Total Bilirubin: <0.3 mg/dL (ref 0.20–1.20)
Total Protein: 6.5 g/dL (ref 6.4–8.3)

## 2016-01-13 LAB — IRON AND TIBC
%SAT: 58 % — ABNORMAL HIGH (ref 21–57)
Iron: 119 ug/dL (ref 41–142)
TIBC: 205 ug/dL — ABNORMAL LOW (ref 236–444)
UIBC: 86 ug/dL — ABNORMAL LOW (ref 120–384)

## 2016-01-13 LAB — CBC WITH DIFFERENTIAL (CANCER CENTER ONLY)
BASO#: 0 10*3/uL (ref 0.0–0.2)
BASO%: 0.3 % (ref 0.0–2.0)
EOS%: 1.5 % (ref 0.0–7.0)
Eosinophils Absolute: 0.1 10*3/uL (ref 0.0–0.5)
HCT: 33.1 % — ABNORMAL LOW (ref 34.8–46.6)
HGB: 10.5 g/dL — ABNORMAL LOW (ref 11.6–15.9)
LYMPH#: 1.5 10*3/uL (ref 0.9–3.3)
LYMPH%: 22.1 % (ref 14.0–48.0)
MCH: 30.1 pg (ref 26.0–34.0)
MCHC: 31.7 g/dL — ABNORMAL LOW (ref 32.0–36.0)
MCV: 95 fL (ref 81–101)
MONO#: 0.5 10*3/uL (ref 0.1–0.9)
MONO%: 7.5 % (ref 0.0–13.0)
NEUT#: 4.5 10*3/uL (ref 1.5–6.5)
NEUT%: 68.6 % (ref 39.6–80.0)
Platelets: 200 10*3/uL (ref 145–400)
RBC: 3.49 10*6/uL — ABNORMAL LOW (ref 3.70–5.32)
RDW: 14.3 % (ref 11.1–15.7)
WBC: 6.6 10*3/uL (ref 3.9–10.0)

## 2016-01-13 LAB — FERRITIN: Ferritin: 513 ng/ml — ABNORMAL HIGH (ref 9–269)

## 2016-01-13 MED ORDER — DARBEPOETIN ALFA 300 MCG/0.6ML IJ SOSY
300.0000 ug | PREFILLED_SYRINGE | Freq: Once | INTRAMUSCULAR | Status: AC
  Administered 2016-01-13: 300 ug via SUBCUTANEOUS

## 2016-01-13 MED ORDER — DARBEPOETIN ALFA 300 MCG/0.6ML IJ SOSY
PREFILLED_SYRINGE | INTRAMUSCULAR | Status: AC
  Filled 2016-01-13: qty 0.6

## 2016-01-13 NOTE — Patient Instructions (Signed)
Darbepoetin Alfa injection What is this medicine? DARBEPOETIN ALFA (dar be POE e tin AL fa) helps your body make more red blood cells. It is used to treat anemia caused by chronic kidney failure and chemotherapy. This medicine may be used for other purposes; ask your health care provider or pharmacist if you have questions. What should I tell my health care provider before I take this medicine? They need to know if you have any of these conditions: -blood clotting disorders or history of blood clots -cancer patient not on chemotherapy -cystic fibrosis -heart disease, such as angina, heart failure, or a history of a heart attack -hemoglobin level of 12 g/dL or greater -high blood pressure -low levels of folate, iron, or vitamin B12 -seizures -an unusual or allergic reaction to darbepoetin, erythropoietin, albumin, hamster proteins, latex, other medicines, foods, dyes, or preservatives -pregnant or trying to get pregnant -breast-feeding How should I use this medicine? This medicine is for injection into a vein or under the skin. It is usually given by a health care professional in a hospital or clinic setting. If you get this medicine at home, you will be taught how to prepare and give this medicine. Do not shake the solution before you withdraw a dose. Use exactly as directed. Take your medicine at regular intervals. Do not take your medicine more often than directed. It is important that you put your used needles and syringes in a special sharps container. Do not put them in a trash can. If you do not have a sharps container, call your pharmacist or healthcare provider to get one. Talk to your pediatrician regarding the use of this medicine in children. While this medicine may be used in children as young as 1 year for selected conditions, precautions do apply. Overdosage: If you think you have taken too much of this medicine contact a poison control center or emergency room at once. NOTE:  This medicine is only for you. Do not share this medicine with others. What if I miss a dose? If you miss a dose, take it as soon as you can. If it is almost time for your next dose, take only that dose. Do not take double or extra doses. What may interact with this medicine? Do not take this medicine with any of the following medications: -epoetin alfa This list may not describe all possible interactions. Give your health care provider a list of all the medicines, herbs, non-prescription drugs, or dietary supplements you use. Also tell them if you smoke, drink alcohol, or use illegal drugs. Some items may interact with your medicine. What should I watch for while using this medicine? Visit your prescriber or health care professional for regular checks on your progress and for the needed blood tests and blood pressure measurements. It is especially important for the doctor to make sure your hemoglobin level is in the desired range, to limit the risk of potential side effects and to give you the best benefit. Keep all appointments for any recommended tests. Check your blood pressure as directed. Ask your doctor what your blood pressure should be and when you should contact him or her. As your body makes more red blood cells, you may need to take iron, folic acid, or vitamin B supplements. Ask your doctor or health care provider which products are right for you. If you have kidney disease continue dietary restrictions, even though this medication can make you feel better. Talk with your doctor or health care professional about the   foods you eat and the vitamins that you take. What side effects may I notice from receiving this medicine? Side effects that you should report to your doctor or health care professional as soon as possible: -allergic reactions like skin rash, itching or hives, swelling of the face, lips, or tongue -breathing problems -changes in vision -chest pain -confusion, trouble speaking  or understanding -feeling faint or lightheaded, falls -high blood pressure -muscle aches or pains -pain, swelling, warmth in the leg -rapid weight gain -severe headaches -sudden numbness or weakness of the face, arm or leg -trouble walking, dizziness, loss of balance or coordination -seizures (convulsions) -swelling of the ankles, feet, hands -unusually weak or tired Side effects that usually do not require medical attention (report to your doctor or health care professional if they continue or are bothersome): -diarrhea -fever, chills (flu-like symptoms) -headaches -nausea, vomiting -redness, stinging, or swelling at site where injected This list may not describe all possible side effects. Call your doctor for medical advice about side effects. You may report side effects to FDA at 1-800-FDA-1088. Where should I keep my medicine? Keep out of the reach of children. Store in a refrigerator between 2 and 8 degrees C (36 and 46 degrees F). Do not freeze. Do not shake. Throw away any unused portion if using a single-dose vial. Throw away any unused medicine after the expiration date. NOTE: This sheet is a summary. It may not cover all possible information. If you have questions about this medicine, talk to your doctor, pharmacist, or health care provider.    2016, Elsevier/Gold Standard. (2008-05-26 10:23:57)  

## 2016-01-13 NOTE — Progress Notes (Signed)
Hematology and Oncology Follow Up Visit  Kelly Soto [id number] Aug 17, 1955 60 y.o. 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 is a back for followup.  She is enjoying her retirement. She really enjoys the freedom of being retired. However, she is going to start a business. This will be an [place]. She will get this started in the next month.   Her blood sugars have been doing pretty good for she says. Dr. Brigitte Soto really follows this closely.   We last saw her back in May, her ferritin was 646 with an iron saturation of 41%.   She's had no change in bowel or bladder habits. She has noticed some easy bruising on her forearms. She is not on aspirin. She does not take nonsteroidals. I had a suspect this is related to her having diabetes and positive from her medications. I told her to try vitamin C. This may help with some of the bruising.   She's had no bleeding.   Overall, her performance status is ECOG 1.     Medications:  Current outpatient prescriptions:  .  baclofen (LIORESAL) 10 MG tablet, Take 10 mg by mouth as needed. migraines, Disp: , Rfl:  .  BENICAR HCT 40-25 MG per tablet, Take 1 tablet by mouth daily., Disp: , Rfl:  .  Calcium Carbonate-Vitamin D (CALCIUM 500/VITAMIN D PO), Take by mouth 2 (two) times daily. GUMMIES, Disp: , Rfl:  .  clindamycin (CLEOCIN-T) 1 % lotion, Apply topically 2 (two) times daily., Disp: 60 mL, Rfl: 0 .  denosumab (PROLIA) 60 MG/ML SOLN injection, Inject 60 mg into the skin every 6 (six) months. Administer in upper arm, thigh, or abdomen, Disp: , Rfl:  .  diazepam (VALIUM) 5 MG tablet, Take 5 mg by mouth daily as needed., Disp: , Rfl: 0 .  fluticasone (FLONASE) 50  MCG/ACT nasal spray, Place 2 sprays into both nostrils at bedtime., Disp: , Rfl:  .  glyBURIDE-metformin (GLUCOVANCE) 2.5-500 MG per tablet, Take 2 tablets by mouth 2 (two) times daily., Disp: , Rfl:  .  JARDIANCE 25 MG TABS tablet, Take 25 mg by mouth every morning., Disp: , Rfl: 3 .  meclizine (ANTIVERT) 25 MG tablet, TAKE 1 TABLET BY MOUTH 3 TIMES A DAY AS NEEDED FOR VERTIGO, Disp: , Rfl: 0 .  omeprazole (PRILOSEC) 20 MG capsule, Take 1 capsule (20 mg total) by mouth 2 (two) times daily before a meal., Disp: 60 capsule, Rfl: 6 .  Pediatric Multiple Vit-C-FA (FLINSTONES GUMMIES OMEGA-3 DHA PO), Take 2 tablets by mouth daily., Disp: , Rfl:  .  promethazine (PHENERGAN) 25 MG tablet, Take 25-50 mg by mouth every 8 (eight) hours as needed (for migraine induced nausea)., Disp: , Rfl:  .  topiramate (TOPAMAX) 25 MG tablet, Take 25-50 mg by mouth 2 (two) times daily. 50 mg in the morning and 25 mg at night, Disp: , Rfl:   Allergies:  Allergies  Allergen Reactions  . Nsaids Anaphylaxis  . Ace Inhibitors     Past Medical History, Surgical history, Social history, and Family History were reviewed and updated.  Review of Systems: As above  Physical Exam:  height is 5\' 1"  (1.549 m) and weight is  122 lb (55.339 kg). Her oral temperature is 97.5 F (36.4 C). Her blood pressure is 135/69 and her Soto is 76. Her respiration is 18.   Well-developed and well-nourished white female in no obvious distress. Head and neck exam shows no ocular or oral lesions. She is no palpable cervical or supraclavicular lymph nodes. Lungs are clear with no wheezes or rhonchi. Cardiac exam regular rate and rhythm with no murmurs rubs or bruits. Abdomen is soft. She is good bowel sounds. There is no fluid wave. There is no palpable liver or spleen tip. Back exam shows no tenderness over the spine ribs or hips. Extremities shows no clubbing cyanosis or edema. Skin exam shows a macular type rash on her forearms.. Neurological  exam is nonfocal.  Lab Results  Component Value Date   WBC 6.6 01/13/2016   HGB 10.5* 01/13/2016   HCT 33.1* 01/13/2016   MCV 95 01/13/2016   PLT 200 01/13/2016     Chemistry      Component Value Date/Time   NA 138 11/01/2015 0743   NA 140 03/02/2015 0926   NA 135 07/23/2013 1532   K 5.0 11/01/2015 0743   K 5.7* 03/02/2015 0926   K 5.2 07/23/2013 1532   CL 103 03/02/2015 0926   CL 106 07/23/2013 1532   CO2 20* 11/01/2015 0743   CO2 21 03/02/2015 0926   CO2 22 07/23/2013 1532   BUN 35.3* 11/01/2015 0743   BUN 32* 03/02/2015 0926   BUN 21 07/23/2013 1532   CREATININE 1.3* 11/01/2015 0743   CREATININE 1.2 03/02/2015 0926   CREATININE 1.13* 07/23/2013 1532      Component Value Date/Time   CALCIUM 9.1 11/01/2015 0743   CALCIUM 9.6 03/02/2015 0926   CALCIUM 9.5 07/23/2013 1532   ALKPHOS 39* 11/01/2015 0743   ALKPHOS 44 03/02/2015 0926   ALKPHOS 48 07/23/2013 1532   AST 15 11/01/2015 0743   AST 18 03/02/2015 0926   AST 14 07/23/2013 1532   ALT 18 11/01/2015 0743   ALT 20 03/02/2015 0926   ALT 15 07/23/2013 1532   BILITOT <0.30 11/01/2015 0743   BILITOT 0.30 03/02/2015 0926   BILITOT 0.2 07/23/2013 1532         Impression and Plan: Kelly Soto is 60 year old white female. She has diabetes. She has iron deficiency anemia. She has a very low erythropoietin level.  We will go ahead and give her a dose of Aranesp today. We will see what her iron levels show. I think that her iron should be okay just by the MCV.  Hopefully, the vitamin C will help her.  I think we can probably get her back in 3 months now    Volanda Napoleon, MD 7/20/20178:20 AM

## 2016-01-14 ENCOUNTER — Telehealth: Payer: Self-pay

## 2016-01-14 LAB — RETICULOCYTES: Reticulocyte Count: 0.5 % — ABNORMAL LOW (ref 0.6–2.6)

## 2016-01-14 NOTE — Telephone Encounter (Addendum)
-----   Message from Volanda Napoleon, MD sent at 01/13/2016  2:48 PM EDT ----- Call - iron level is still ok!!!  Laurey Arrow  Above message left on personalized VM with instructions to call with questions. dph

## 2016-03-10 ENCOUNTER — Other Ambulatory Visit: Payer: Self-pay | Admitting: Family Medicine

## 2016-03-10 DIAGNOSIS — R197 Diarrhea, unspecified: Secondary | ICD-10-CM

## 2016-03-15 ENCOUNTER — Ambulatory Visit
Admission: RE | Admit: 2016-03-15 | Discharge: 2016-03-15 | Disposition: A | Discharge status: Home / Self Care | Payer: BLUE CROSS/BLUE SHIELD | Source: Ambulatory Visit | Attending: Family Medicine | Admitting: Family Medicine

## 2016-03-15 DIAGNOSIS — R197 Diarrhea, unspecified: Secondary | ICD-10-CM

## 2016-03-20 ENCOUNTER — Ambulatory Visit: Payer: Self-pay | Admitting: Surgery

## 2016-03-20 NOTE — H&P (Signed)
Kelly Soto 03/20/2016 1:19 PM Location: [facility] Patient #: [id number] DOB: 1956/03/01 Married / Language: English / Race: White Female  History of Present Illness Marcello Moores A. Nandan Willems MD; 03/20/2016 2:25 PM) Patient words: gb   Patient sent at the request of Dr. Serita Grammes for 2 month history of epigastric abdominal pain, nausea and diarrhea. Patient is a history of a gastric bypass done in 2008 by Dr. Hassell Done. She lost over 100 pounds. Over the last year she's had intermittent epigastric pain after eating with some nausea. Has a history of an upper GI bleed 5 years ago but has had no problems since. Her most recent endoscopy was 5 years ago. She's now developed right upper quadrant pain after eating. She does have some nausea and diarrhea after eating fatty greasy foods. Pain is made better by eating low-fat diet. Her weight is otherwise been stable.                  CLINICAL DATA: Abdominal pain and nausea for 1 week  EXAM: US ABDOMEN LIMITED - RIGHT UPPER QUADRANT  COMPARISON: January 05, 2014  FINDINGS: Gallbladder:  Within the gallbladder, there is a 7 mm echogenic focus along the posterior wall, which was present previously and may have increased in size slightly compared to previous study. This lesion measured just under 6 mm on prior study by my remeasurement. A second apparent polyp measures 2 mm. There are no echogenic foci which move and shadow as is expected with gallstones. No gallbladder wall thickening or pericholecystic fluid. No sonographic Murphy sign noted by sonographer.  Common bile duct:  Diameter: 3 mm. No intrahepatic or extrahepatic biliary duct dilatation.  Liver:  There is a stable uniformly echogenic focus in the anterior segment right lobe of the liver measuring 8 x 7 x 8 mm, stable. There is a 4 x 4 x 4 mm echogenic focus in the right lobe of the liver near the dome which has not been demonstrated  previously. Within normal limits in parenchymal echogenicity.  IMPRESSION: There are gallbladder polyps. The largest polyp has become marginally larger compared to 2 years prior. It measures approximately 7 mm currently. Given an apparent increase in this polyp, surgical consultation is felt to be warranted based on consensus guidelines. Note that there are no gallstones or gallbladder wall thickening.  **An incidental finding of potential clinical significance has been found. Questionably new 4 x 4 mm echogenic focus in the right lobe of the liver. Stable apparent hemangioma in the anterior segment right lobe measuring 8 mm. Given a questionable new albeit benign appearing liver lesion, follow-up ultrasound in approximately 1 year advised to assess for stability.**   Electronically Signed By: Lowella Grip III M.D. On: 03/15/2016 09:12.  The patient is a 60 year old female.   Other Problems Ventura Sellers, CMA; 03/20/2016 1:19 PM) Anxiety Disorder Depression Diabetes Mellitus Gastric Ulcer Gastroesophageal Reflux Disease High blood pressure Migraine Headache Sleep Apnea Transfusion history  Past Surgical History Ventura Sellers, CMA; 03/20/2016 1:19 PM) Gastric Bypass Hysterectomy (not due to cancer) - Partial  Diagnostic Studies History Ventura Sellers, CMA; 03/20/2016 1:19 PM) Mammogram 1-3 years ago  Allergies Ventura Sellers, CMA; 03/20/2016 1:19 PM) ACE Inhibitors NSAIDs  Medication History Ventura Sellers, CMA; 03/20/2016 1:21 PM) Topiramate (Oral) Specific dose unknown - Active. Baclofen (10MG  Tablet, Oral) Active. Olmesartan Medoxomil-HCTZ (20-12.5MG  Tablet, Oral) Active. Omeprazole (20MG  Capsule DR, Oral) Active. Jardiance (25MG  Tablet, Oral) Active. Fluticasone Propionate (50MCG/ACT  Suspension, Nasal) Active. MetFORMIN HCl (500MG  Tablet, Oral) Active. Promethazine HCl (25MG  Tablet, Oral) Active. Medications  Reconciled  Social History Ventura Sellers, [place]; 03/20/2016 1:19 PM) Caffeine use Coffee. No alcohol use No drug use Tobacco use Never smoker.  Family History Ventura Sellers, [place]; 03/20/2016 1:19 PM) Cancer Family Members In General. Colon Polyps Family Members In General. Depression Family Members In General, Mother. Heart Disease Father, Mother. Heart disease in female family member before age 43 Thyroid problems Mother.  Pregnancy / Birth History Ventura Sellers, [place]; 03/20/2016 1:19 PM) Age at menarche 59 years. Age of menopause <44 Gravida 4 Maternal age 24-25 Para 4     Review of Systems (Ewa Gentry. Brooks CMA; 03/20/2016 1:19 PM) General Present- Appetite Loss, Fatigue and Weight Loss. Not Present- Chills, Fever, Night Sweats and Weight Gain. Skin Not Present- Change in Wart/Mole, Dryness, Hives, Jaundice, New Lesions, Non-Healing Wounds, Rash and Ulcer. HEENT Present- Wears glasses/contact lenses. Not Present- Earache, Hearing Loss, Hoarseness, Nose Bleed, Oral Ulcers, Ringing in the Ears, Seasonal Allergies, Sinus Pain, Sore Throat, Visual Disturbances and Yellow Eyes. Respiratory Not Present- Bloody sputum, Chronic Cough, Difficulty Breathing, Snoring and Wheezing. Breast Not Present- Breast Mass, Breast Pain, Nipple Discharge and Skin Changes. Cardiovascular Not Present- Chest Pain, Difficulty Breathing Lying Down, Leg Cramps, Palpitations, Rapid Heart Rate, Shortness of Breath and Swelling of Extremities. Gastrointestinal Present- Abdominal Pain, Change in Bowel Habits, Excessive gas and Nausea. Not Present- Bloating, Bloody Stool, Chronic diarrhea, Constipation, Difficulty Swallowing, Gets full quickly at meals, Hemorrhoids, Indigestion, Rectal Pain and Vomiting. Female Genitourinary Not Present- Frequency, Nocturia, Painful Urination, Pelvic Pain and Urgency. Musculoskeletal Not Present- Back Pain, Joint Pain, Joint Stiffness, Muscle Pain, Muscle  Weakness and Swelling of Extremities. Neurological Present- Weakness. Not Present- Decreased Memory, Fainting, Headaches, Numbness, Seizures, Tingling, Tremor and Trouble walking. Psychiatric Not Present- Anxiety, Bipolar, Change in Sleep Pattern, Depression, Fearful and Frequent crying. Endocrine Not Present- Cold Intolerance, Excessive Hunger, Hair Changes, Heat Intolerance, Hot flashes and New Diabetes. Hematology Not Present- Blood Thinners, Easy Bruising, Excessive bleeding, Gland problems, HIV and Persistent Infections.  Vitals [employer] R. Brooks CMA; 03/20/2016 1:19 PM) 03/20/2016 1:19 PM Weight: 122.25 lb Height: 61in Body Surface Area: 1.53 m Body Mass Index: 23.1 kg/m  BP: 126/74 (Sitting, Left Arm, Standard)      Physical Exam (Yair Dusza A. Azaliah Carrero MD; 03/20/2016 2:25 PM)  General Mental Status-Alert. General Appearance-Consistent with stated age. Hydration-Well hydrated. Voice-Normal.  Head and Neck Head-normocephalic, atraumatic with no lesions or palpable masses. Trachea-midline. Thyroid Gland Characteristics - normal size and consistency.  Chest and Lung Exam Chest and lung exam reveals -quiet, even and easy respiratory effort with no use of accessory muscles and on auscultation, normal breath sounds, no adventitious sounds and normal vocal resonance. Inspection Chest Wall - Normal. Back - normal.  Cardiovascular Cardiovascular examination reveals -normal heart sounds, regular rate and rhythm with no murmurs and normal pedal pulses bilaterally.  Abdomen Note: Scar from gastric bypass noted. Soft. Mild right upper quadrant tenderness noted. Negative Murphy sign.  Musculoskeletal Normal Exam - Left-Upper Extremity Strength Normal and Lower Extremity Strength Normal. Normal Exam - Right-Upper Extremity Strength Normal and Lower Extremity Strength Normal.    Assessment & Plan (AmeLie Hollars A. Francesa Eugenio MD; 03/20/2016 2:27 PM)  GALL BLADDER  POLYP (K82.4) Impression: Discussed findings of ultrasound gallbladder polyp which is increased in size. She also has some sludge and other echogenic findings. Given her history and physical exam findings and discussed the benefit of cholecystectomy.  I cautioned her on diarrhea though given her history of gastric bypass but her symptoms are worsening and beginning to impact her quality of life in her diet. I discussed possible upper endoscopy as well. Given the fact that she has a collar pulp its increasing in size I do feel she will need cholecystectomy at some point. I offered upper endoscopy first to her but I think she would like to proceed with cholecystectomy first and see how she feels. If her symptoms resolve, she will not require an upper endoscopy. If she continues to have issues she may need to be seen in follow-up for the bariatric surgeons at their initial surgery. I've offered her that as well. She would like to proceed with cholecystectomy at this point. The procedure has been discussed with the patient. Risks of laparoscopic cholecystectomy include bleeding, infection, bile duct injury, leak, death, open surgery, diarrhea, other surgery, organ injury, blood vessel injury, DVT, and additional care.  Current Plans You are being scheduled for surgery - Our schedulers will call you.  You should hear from our office's scheduling department within 5 working days about the location, date, and time of surgery. We try to make accommodations for patient's preferences in scheduling surgery, but sometimes the OR schedule or the surgeon's schedule prevents [place] from making those accommodations.  If you have not heard from our office [phone]) in 5 working days, call the office and ask for your surgeon's nurse.  If you have other questions about your diagnosis, plan, or surgery, call the office and ask for your surgeon's nurse.  Pt Education - Pamphlet Given - Laparoscopic Gallbladder Surgery:  discussed with patient and provided information. The anatomy & physiology of hepatobiliary & pancreatic function was discussed. The pathophysiology of gallbladder dysfunction was discussed. Natural history risks without surgery was discussed. I feel the risks of no intervention will lead to serious problems that outweigh the operative risks; therefore, I recommended cholecystectomy to remove the pathology. I explained laparoscopic techniques with possible need for an open approach. Probable cholangiogram to evaluate the bilary tract was explained as well.  Risks such as bleeding, infection, abscess, leak, injury to other organs, need for further treatment, heart attack, death, and other risks were discussed. I noted a good likelihood this will help address the problem. Possibility that this will not correct all abdominal symptoms was explained. Goals of post-operative recovery were discussed as well. We will work to minimize complications. An educational handout further explaining the pathology and treatment options was given as well. Questions were answered. The patient expresses understanding & wishes to proceed with surgery.  Pt Education - CCS Laparosopic Post Op HCI (Gross) Pt Education - Laparoscopic Cholecystectomy: gallbladder HISTORY OF GASTRIC BYPASS (Z98.890)

## 2016-04-11 ENCOUNTER — Encounter (HOSPITAL_COMMUNITY): Payer: Self-pay

## 2016-04-11 ENCOUNTER — Encounter (HOSPITAL_COMMUNITY)
Admission: RE | Admit: 2016-04-11 | Discharge: 2016-04-11 | Disposition: A | Discharge status: Home / Self Care | Payer: BLUE CROSS/BLUE SHIELD | Source: Ambulatory Visit | Attending: Surgery | Admitting: Surgery

## 2016-04-11 ENCOUNTER — Other Ambulatory Visit (HOSPITAL_COMMUNITY): Payer: Self-pay | Admitting: *Deleted

## 2016-04-11 ENCOUNTER — Encounter (HOSPITAL_COMMUNITY): Payer: Self-pay | Admitting: Vascular Surgery

## 2016-04-11 DIAGNOSIS — Z01818 Encounter for other preprocedural examination: Secondary | ICD-10-CM | POA: Diagnosis present

## 2016-04-11 HISTORY — DX: Headache, unspecified: R51.9

## 2016-04-11 HISTORY — DX: Essential (primary) hypertension: I10

## 2016-04-11 HISTORY — DX: Personal history of other diseases of the digestive system: Z87.19

## 2016-04-11 HISTORY — DX: Headache: R51

## 2016-04-11 HISTORY — DX: Type 2 diabetes mellitus without complications: E11.9

## 2016-04-11 HISTORY — DX: Other specified postprocedural states: Z98.890

## 2016-04-11 HISTORY — DX: Nausea with vomiting, unspecified: R11.2

## 2016-04-11 LAB — COMPREHENSIVE METABOLIC PANEL
ALT: 21 U/L (ref 14–54)
AST: 19 U/L (ref 15–41)
Albumin: 3.7 g/dL (ref 3.5–5.0)
Alkaline Phosphatase: 42 U/L (ref 38–126)
Anion gap: 6 (ref 5–15)
BUN: 43 mg/dL — ABNORMAL HIGH (ref 6–20)
CO2: 22 mmol/L (ref 22–32)
Calcium: 9.5 mg/dL (ref 8.9–10.3)
Chloride: 109 mmol/L (ref 101–111)
Creatinine, Ser: 1.59 mg/dL — ABNORMAL HIGH (ref 0.44–1.00)
GFR calc Af Amer: 39 mL/min — ABNORMAL LOW (ref >60)
GFR calc non Af Amer: 34 mL/min — ABNORMAL LOW (ref >60)
Glucose, Bld: 237 mg/dL — ABNORMAL HIGH (ref 65–99)
Potassium: 6.3 mmol/L (ref 3.5–5.1)
Sodium: 137 mmol/L (ref 135–145)
Total Bilirubin: 0.4 mg/dL (ref 0.3–1.2)
Total Protein: 6.9 g/dL (ref 6.5–8.1)

## 2016-04-11 LAB — CBC WITH DIFFERENTIAL/PLATELET
Basophils Absolute: 0 10*3/uL (ref 0.0–0.1)
Basophils Relative: 1 %
Eosinophils Absolute: 0.1 10*3/uL (ref 0.0–0.7)
Eosinophils Relative: 1 %
HCT: 31.6 % — ABNORMAL LOW (ref 36.0–46.0)
Hemoglobin: 9.7 g/dL — ABNORMAL LOW (ref 12.0–15.0)
Lymphocytes Relative: 24 %
Lymphs Abs: 1.6 10*3/uL (ref 0.7–4.0)
MCH: 28.2 pg (ref 26.0–34.0)
MCHC: 30.7 g/dL (ref 30.0–36.0)
MCV: 91.9 fL (ref 78.0–100.0)
Monocytes Absolute: 0.4 10*3/uL (ref 0.1–1.0)
Monocytes Relative: 6 %
Neutro Abs: 4.5 10*3/uL (ref 1.7–7.7)
Neutrophils Relative %: 68 %
Platelets: 258 10*3/uL (ref 150–400)
RBC: 3.44 MIL/uL — ABNORMAL LOW (ref 3.87–5.11)
RDW: 15.5 % (ref 11.5–15.5)
WBC: 6.6 10*3/uL (ref 4.0–10.5)

## 2016-04-11 LAB — GLUCOSE, CAPILLARY: Glucose-Capillary: 270 mg/dL — ABNORMAL HIGH (ref 65–99)

## 2016-04-11 NOTE — Pre-Procedure Instructions (Addendum)
Kelly Soto  04/11/2016      [id number] - [place], [place] - [address] [place] [address] Phone: [phone] Fax: [phone]    Your procedure is scheduled on Thursday, April 13, 2016 at 7:30 AM.  Report to [facility] Entrance "A" Admitting Office at 5:30 AM.  Call this number if you have problems the morning of surgery:[phone]  Questions prior to day of surgery, please call [phone] between 8 & 4 PM.   Remember:  Do not eat food or drink liquids after midnight Wednesday, 04/12/16.  Take these medicines the morning of surgery with A SIP OF WATER: Omeprazole (Prilosec), Topiramate (Topamax), Diazepam (Valium) - if needed.   How to Manage Your Diabetes Before Surgery   Why is it important to control my blood sugar before and after surgery?   Improving blood sugar levels before and after surgery helps healing and can limit problems.  A way of improving blood sugar control is eating a healthy diet by:  - Eating less sugar and carbohydrates  - Increasing activity/exercise  - Talk with your doctor about reaching your blood sugar goals  High blood sugars (greater than 180 mg/dL) can raise your risk of infections and slow down your recovery so you will need to focus on controlling your diabetes during the weeks before surgery.  Make sure that the doctor who takes care of your diabetes knows about your planned surgery including the date and location.  How do I manage my blood sugars before surgery?   Check your blood sugar at least 4 times a day, 2 days before surgery to make sure that they are not too high or low.  Check your blood sugar the morning of your surgery when you wake up and every 2 hours until you get to the Short-Stay unit.  Treat a low blood sugar (less than 70 mg/dL) with 1/2 cup of clear juice (cranberry or apple), 4 glucose tablets, OR glucose gel.  Recheck  blood sugar in 15 minutes after treatment (to make sure it is greater than 70 mg/dL).  If blood sugar is not greater than 70 mg/dL on re-check, call [phone] for further instructions.   Report your blood sugar to the Short-Stay nurse when you get to Short-Stay.  References:  [facility], 2007 "How to Manage your Diabetes Before and After Surgery".  What do I do about my diabetes medications?   Do not take oral diabetes medicines (pills) the morning of surgery.  THE NIGHT BEFORE SURGERY, do not take Glyburide-Metformin (Glucovance)   Do not wear jewelry, make-up or nail polish.  Do not wear lotions, powders, or perfumes, or deodorant.  Do not shave 48 hours prior to surgery.    Do not bring valuables to the hospital.  [facility] is not responsible for any belongings or valuables.  Contacts, dentures or bridgework may not be worn into surgery.  Leave your suitcase in the car.  After surgery it may be brought to your room.  For patients admitted to the hospital, discharge time will be determined by your treatment team.  Patients discharged the day of surgery will not be allowed to drive home.   Special instructions:  See "Preparing for Surgery" Instruction sheet.  Please read over the following fact sheets that you were given. Pain Booklet, Coughing and Deep Breathing and Surgical Site Infection Prevention

## 2016-04-11 NOTE — Progress Notes (Addendum)
PCP:Dr.Kimberelee Brigitte Pulse @ [facility]: she manages pt's diabetes. Will request Hgb A1C. Pt. States fasting sugars ran 150-200, Dr. Brigitte Pulse changed her meds 2 days ago and sugars run 106-110 fasting.  Dr. Marin Olp : renal insufficiency/amenia of renal disease. Pt. Also gets darbepoetin alfa injections at his office every 3 months.  Had cath/stress 10 yrs. Ago for sob/chest pain. At the time weight 250 lbs. Test normal, saw someone at [facility].  Has no problems since and doesn't see a cardiologist.  Notified Dr. Josetta Huddle office critical K+ level. 6.3 Spoke to [place] at his office. She stated she will get in touch with Dr.cornett.

## 2016-04-12 ENCOUNTER — Ambulatory Visit (HOSPITAL_BASED_OUTPATIENT_CLINIC_OR_DEPARTMENT_OTHER): Payer: BLUE CROSS/BLUE SHIELD

## 2016-04-12 ENCOUNTER — Other Ambulatory Visit (HOSPITAL_BASED_OUTPATIENT_CLINIC_OR_DEPARTMENT_OTHER): Payer: BLUE CROSS/BLUE SHIELD

## 2016-04-12 ENCOUNTER — Encounter: Payer: Self-pay | Admitting: Hematology & Oncology

## 2016-04-12 ENCOUNTER — Ambulatory Visit (HOSPITAL_BASED_OUTPATIENT_CLINIC_OR_DEPARTMENT_OTHER): Payer: BLUE CROSS/BLUE SHIELD | Admitting: Hematology & Oncology

## 2016-04-12 VITALS — BP 154/71 | HR 78 | Temp 97.8°F | Resp 16 | Ht 60.0 in | Wt 124.1 lb

## 2016-04-12 DIAGNOSIS — K9049 Malabsorption due to intolerance, not elsewhere classified: Secondary | ICD-10-CM

## 2016-04-12 DIAGNOSIS — N183 Chronic kidney disease, stage 3 unspecified: Secondary | ICD-10-CM

## 2016-04-12 DIAGNOSIS — D508 Other iron deficiency anemias: Secondary | ICD-10-CM

## 2016-04-12 DIAGNOSIS — D631 Anemia in chronic kidney disease: Secondary | ICD-10-CM

## 2016-04-12 DIAGNOSIS — D509 Iron deficiency anemia, unspecified: Secondary | ICD-10-CM

## 2016-04-12 DIAGNOSIS — N189 Chronic kidney disease, unspecified: Secondary | ICD-10-CM

## 2016-04-12 DIAGNOSIS — E119 Type 2 diabetes mellitus without complications: Secondary | ICD-10-CM

## 2016-04-12 LAB — CBC WITH DIFFERENTIAL (CANCER CENTER ONLY)
BASO#: 0 10*3/uL (ref 0.0–0.2)
BASO%: 0.3 % (ref 0.0–2.0)
EOS%: 1.3 % (ref 0.0–7.0)
Eosinophils Absolute: 0.1 10*3/uL (ref 0.0–0.5)
HCT: 31.6 % — ABNORMAL LOW (ref 34.8–46.6)
HGB: 10 g/dL — ABNORMAL LOW (ref 11.6–15.9)
LYMPH#: 2 10*3/uL (ref 0.9–3.3)
LYMPH%: 26 % (ref 14.0–48.0)
MCH: 29.6 pg (ref 26.0–34.0)
MCHC: 31.6 g/dL — ABNORMAL LOW (ref 32.0–36.0)
MCV: 94 fL (ref 81–101)
MONO#: 0.6 10*3/uL (ref 0.1–0.9)
MONO%: 8.4 % (ref 0.0–13.0)
NEUT#: 4.9 10*3/uL (ref 1.5–6.5)
NEUT%: 64 % (ref 39.6–80.0)
Platelets: 261 10*3/uL (ref 145–400)
RBC: 3.38 10*6/uL — ABNORMAL LOW (ref 3.70–5.32)
RDW: 15.1 % (ref 11.1–15.7)
WBC: 7.6 10*3/uL (ref 3.9–10.0)

## 2016-04-12 LAB — COMPREHENSIVE METABOLIC PANEL
ALT: 17 U/L (ref 0–55)
AST: 18 U/L (ref 5–34)
Albumin: 3.7 g/dL (ref 3.5–5.0)
Alkaline Phosphatase: 56 U/L (ref 40–150)
Anion Gap: 8 mEq/L (ref 3–11)
BUN: 39.2 mg/dL — ABNORMAL HIGH (ref 7.0–26.0)
CO2: 21 mEq/L — ABNORMAL LOW (ref 22–29)
Calcium: 9.7 mg/dL (ref 8.4–10.4)
Chloride: 111 mEq/L — ABNORMAL HIGH (ref 98–109)
Creatinine: 1.4 mg/dL — ABNORMAL HIGH (ref 0.6–1.1)
EGFR: 40 mL/min/{1.73_m2} — ABNORMAL LOW (ref >90)
Glucose: 110 mg/dl (ref 70–140)
Potassium: 5.8 mEq/L — ABNORMAL HIGH (ref 3.5–5.1)
Sodium: 139 mEq/L (ref 136–145)
Total Bilirubin: 0.22 mg/dL (ref 0.20–1.20)
Total Protein: 7.2 g/dL (ref 6.4–8.3)

## 2016-04-12 LAB — IRON AND TIBC
%SAT: 45 % (ref 21–57)
Iron: 118 ug/dL (ref 41–142)
TIBC: 261 ug/dL (ref 236–444)
UIBC: 143 ug/dL (ref 120–384)

## 2016-04-12 LAB — FERRITIN: Ferritin: 489 ng/ml — ABNORMAL HIGH (ref 9–269)

## 2016-04-12 MED ORDER — DARBEPOETIN ALFA 300 MCG/0.6ML IJ SOSY
PREFILLED_SYRINGE | INTRAMUSCULAR | Status: AC
  Filled 2016-04-12: qty 0.6

## 2016-04-12 MED ORDER — DARBEPOETIN ALFA 300 MCG/0.6ML IJ SOSY
300.0000 ug | PREFILLED_SYRINGE | Freq: Once | INTRAMUSCULAR | Status: AC
  Administered 2016-04-12: 300 ug via SUBCUTANEOUS

## 2016-04-12 NOTE — Patient Instructions (Signed)
Darbepoetin Alfa injection What is this medicine? DARBEPOETIN ALFA (dar be POE e tin AL fa) helps your body make more red blood cells. It is used to treat anemia caused by chronic kidney failure and chemotherapy. This medicine may be used for other purposes; ask your health care provider or pharmacist if you have questions. What should I tell my health care provider before I take this medicine? They need to know if you have any of these conditions: -blood clotting disorders or history of blood clots -cancer patient not on chemotherapy -cystic fibrosis -heart disease, such as angina, heart failure, or a history of a heart attack -hemoglobin level of 12 g/dL or greater -high blood pressure -low levels of folate, iron, or vitamin B12 -seizures -an unusual or allergic reaction to darbepoetin, erythropoietin, albumin, hamster proteins, latex, other medicines, foods, dyes, or preservatives -pregnant or trying to get pregnant -breast-feeding How should I use this medicine? This medicine is for injection into a vein or under the skin. It is usually given by a health care professional in a hospital or clinic setting. If you get this medicine at home, you will be taught how to prepare and give this medicine. Do not shake the solution before you withdraw a dose. Use exactly as directed. Take your medicine at regular intervals. Do not take your medicine more often than directed. It is important that you put your used needles and syringes in a special sharps container. Do not put them in a trash can. If you do not have a sharps container, call your pharmacist or healthcare provider to get one. Talk to your pediatrician regarding the use of this medicine in children. While this medicine may be used in children as young as 1 year for selected conditions, precautions do apply. Overdosage: If you think you have taken too much of this medicine contact a poison control center or emergency room at once. NOTE:  This medicine is only for you. Do not share this medicine with others. What if I miss a dose? If you miss a dose, take it as soon as you can. If it is almost time for your next dose, take only that dose. Do not take double or extra doses. What may interact with this medicine? Do not take this medicine with any of the following medications: -epoetin alfa This list may not describe all possible interactions. Give your health care provider a list of all the medicines, herbs, non-prescription drugs, or dietary supplements you use. Also tell them if you smoke, drink alcohol, or use illegal drugs. Some items may interact with your medicine. What should I watch for while using this medicine? Visit your prescriber or health care professional for regular checks on your progress and for the needed blood tests and blood pressure measurements. It is especially important for the doctor to make sure your hemoglobin level is in the desired range, to limit the risk of potential side effects and to give you the best benefit. Keep all appointments for any recommended tests. Check your blood pressure as directed. Ask your doctor what your blood pressure should be and when you should contact him or her. As your body makes more red blood cells, you may need to take iron, folic acid, or vitamin B supplements. Ask your doctor or health care provider which products are right for you. If you have kidney disease continue dietary restrictions, even though this medication can make you feel better. Talk with your doctor or health care professional about the   foods you eat and the vitamins that you take. What side effects may I notice from receiving this medicine? Side effects that you should report to your doctor or health care professional as soon as possible: -allergic reactions like skin rash, itching or hives, swelling of the face, lips, or tongue -breathing problems -changes in vision -chest pain -confusion, trouble speaking  or understanding -feeling faint or lightheaded, falls -high blood pressure -muscle aches or pains -pain, swelling, warmth in the leg -rapid weight gain -severe headaches -sudden numbness or weakness of the face, arm or leg -trouble walking, dizziness, loss of balance or coordination -seizures (convulsions) -swelling of the ankles, feet, hands -unusually weak or tired Side effects that usually do not require medical attention (report to your doctor or health care professional if they continue or are bothersome): -diarrhea -fever, chills (flu-like symptoms) -headaches -nausea, vomiting -redness, stinging, or swelling at site where injected This list may not describe all possible side effects. Call your doctor for medical advice about side effects. You may report side effects to FDA at 1-800-FDA-1088. Where should I keep my medicine? Keep out of the reach of children. Store in a refrigerator between 2 and 8 degrees C (36 and 46 degrees F). Do not freeze. Do not shake. Throw away any unused portion if using a single-dose vial. Throw away any unused medicine after the expiration date. NOTE: This sheet is a summary. It may not cover all possible information. If you have questions about this medicine, talk to your doctor, pharmacist, or health care provider.    2016, Elsevier/Gold Standard. (2008-05-26 10:23:57)  

## 2016-04-12 NOTE — Progress Notes (Signed)
Hematology and Oncology Follow Up Visit  LYNDEE HERBST [id number] 06-17-1956 60 y.o. 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  Mccubbin is back for follow-up. Unfortunately, she is to have gallbladder surgery. She has been having a lot of symptoms from cholecystitis over the past month or so. She's had nausea and vomiting. She did not be no need that much.   She was supposed to have surgery tomorrow. However, with preop blood work done yesterday, her potassium was 6.3. As such, she her surgery was postponed. We are checking her metabolic panel today.   She, otherwise, is doing okay. Her blood sugars have been on the higher side.  She's had no bleeding. She's had some diarrhea because of the cholecystitis.  She's had no rashes. She's had no fever. She's had no cough or shortness of breath.   Overall, her performance status is ECOG 1.     Medications:  Current Outpatient Prescriptions:  .  acetaminophen (TYLENOL) 500 MG chewable tablet, Chew 500-1,000 mg by mouth every 6 (six) hours as needed for pain (depends on pain level)., Disp: , Rfl:  .  baclofen (LIORESAL) 10 MG tablet, Take 10 mg by mouth as needed. migraines, Disp: , Rfl:  .  BENICAR HCT 40-25 MG per tablet, Take 1 tablet by mouth daily., Disp: , Rfl:  .  betamethasone valerate (VALISONE) 0.1 % cream, Apply 1 application topically 2 (two) times daily as needed (rash)., Disp: , Rfl:  .  clindamycin (CLEOCIN-T) 1 % lotion, Apply topically 2 (two) times daily., Disp: 60 mL, Rfl: 0 .  denosumab (PROLIA) 60 MG/ML SOLN injection, Inject 60 mg into the skin every 6 (six) months. Administer in upper arm, thigh, or abdomen  Next injection is due 05-2016, Disp: , Rfl:  .   diazepam (VALIUM) 5 MG tablet, Take 5 mg by mouth daily as needed for anxiety. , Disp: , Rfl: 0 .  Empagliflozin-Linagliptin (GLYXAMBI) 25-5 MG TABS, Take 1 tablet by mouth daily., Disp: , Rfl:  .  fluticasone (FLONASE) 50 MCG/ACT nasal spray, Place 2 sprays into both nostrils at bedtime., Disp: , Rfl:  .  glyBURIDE-metformin (GLUCOVANCE) 2.5-500 MG per tablet, Take 2 tablets by mouth 2 (two) times daily., Disp: , Rfl:  .  hydroxypropyl methylcellulose / hypromellose (ISOPTO TEARS / GONIOVISC) 2.5 % ophthalmic solution, Place 1 drop into both eyes 3 (three) times daily., Disp: , Rfl:  .  meclizine (ANTIVERT) 25 MG tablet, TAKE 1 TABLET BY MOUTH 3 TIMES A DAY AS NEEDED FOR VERTIGO, Disp: , Rfl: 0 .  omeprazole (PRILOSEC) 40 MG capsule, Take 40 mg by mouth 2 (two) times daily., Disp: , Rfl:  .  promethazine (PHENERGAN) 25 MG tablet, Take 25-50 mg by mouth every 8 (eight) hours as needed (for migraine induced nausea)., Disp: , Rfl:  .  topiramate (TOPAMAX) 25 MG tablet, Take 75 mg by mouth daily. 50 mg in the morning and 25 mg at night, Disp: , Rfl:  .  triamcinolone cream (KENALOG) 0.1 %, Apply 1 application topically 2 (two) times daily as needed (skin irritation)., Disp: , Rfl:   Allergies:  Allergies  Allergen Reactions  . Aspirin Other (See Comments)    Cannot take due  to gastric bypass surgery  . Nsaids Anaphylaxis  . Ace Inhibitors Other (See Comments)    Angioedema  . Boniva [Ibandronic Acid] Other (See Comments)    Joint pain  . Sular [Nisoldipine Er] Other (See Comments)    angioedema    Past Medical History, Surgical history, Social history, and Family History were reviewed and updated.  Review of Systems: As above  Physical Exam:  height is 5' (1.524 m) and weight is 124 lb 1.9 oz (56.3 kg). Her oral temperature is 97.8 F (36.6 C). Her blood pressure is 154/71 (abnormal) and her pulse is 78. Her respiration is 16.   Well-developed and well-nourished white female in no  obvious distress. Head and neck exam shows no ocular or oral lesions. She is no palpable cervical or supraclavicular lymph nodes. Lungs are clear with no wheezes or rhonchi. Cardiac exam regular rate and rhythm with no murmurs rubs or bruits. Abdomen is soft. She is good bowel sounds. There is no fluid wave. There is no palpable liver or spleen tip. Back exam shows no tenderness over the spine ribs or hips. Extremities shows no clubbing cyanosis or edema. Skin exam shows a macular type rash on her forearms.. Neurological exam is nonfocal.  Lab Results  Component Value Date   WBC 7.6 04/12/2016   HGB 10.0 (L) 04/12/2016   HCT 31.6 (L) 04/12/2016   MCV 94 04/12/2016   PLT 261 04/12/2016     Chemistry      Component Value Date/Time   NA 137 04/11/2016 0951   NA 137 01/13/2016 0743   K 6.3 (HH) 04/11/2016 0951   K 5.7 (H) 01/13/2016 0743   CL 109 04/11/2016 0951   CL 103 03/02/2015 0926   CO2 22 04/11/2016 0951   CO2 20 (L) 01/13/2016 0743   BUN 43 (H) 04/11/2016 0951   BUN 32.9 (H) 01/13/2016 0743   CREATININE 1.59 (H) 04/11/2016 0951   CREATININE 1.3 (H) 01/13/2016 0743      Component Value Date/Time   CALCIUM 9.5 04/11/2016 0951   CALCIUM 9.1 01/13/2016 0743   ALKPHOS 42 04/11/2016 0951   ALKPHOS 45 01/13/2016 0743   AST 19 04/11/2016 0951   AST 13 01/13/2016 0743   ALT 21 04/11/2016 0951   ALT 15 01/13/2016 0743   BILITOT 0.4 04/11/2016 0951   BILITOT <0.30 01/13/2016 0743         Impression and Plan: Ms. Blower is 60 year old white female. She has diabetes. She has iron deficiency anemia. She has a very low erythropoietin level.  I do feel bad about the confusion regarding her surgery. I know that Dr. Brantley Stage and Dr. Brigitte Pulse are being cautious which I totally agree with. 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 Her potassium levels have always been slightly elevated whenever we see her.  We will see what the potassium is today. Once again the results  back, I will let Dr. Brantley Stage and Dr. Brigitte Pulse know.  We will go ahead and give her Aranesp.  I would like to see her back in 6 weeks. I want to get her back between Thanksgiving and Christmas so we can make sure that her blood counts aren't adequate for the holidays.   Volanda Napoleon, MD 10/18/201712:06 PM

## 2016-04-13 ENCOUNTER — Ambulatory Visit (HOSPITAL_COMMUNITY): Admission: RE | Admit: 2016-04-13 | Payer: BLUE CROSS/BLUE SHIELD | Source: Ambulatory Visit | Admitting: Surgery

## 2016-04-13 ENCOUNTER — Encounter (HOSPITAL_COMMUNITY): Admission: RE | Payer: Self-pay | Source: Ambulatory Visit

## 2016-04-13 LAB — RETICULOCYTES: Reticulocyte Count: 1 % (ref 0.6–2.6)

## 2016-04-13 SURGERY — LAPAROSCOPIC CHOLECYSTECTOMY WITH INTRAOPERATIVE CHOLANGIOGRAM
Anesthesia: General

## 2016-04-14 ENCOUNTER — Ambulatory Visit: Payer: BLUE CROSS/BLUE SHIELD | Admitting: Hematology & Oncology

## 2016-04-14 ENCOUNTER — Ambulatory Visit: Payer: BLUE CROSS/BLUE SHIELD

## 2016-04-14 ENCOUNTER — Telehealth: Payer: Self-pay

## 2016-04-14 ENCOUNTER — Other Ambulatory Visit: Payer: BLUE CROSS/BLUE SHIELD

## 2016-04-14 NOTE — Telephone Encounter (Addendum)
-----   Message from Volanda Napoleon, MD sent at 04/12/2016  5:01 PM EDT ----- Call - iron level is ok!!  K sent to Dr Brantley Stage.  pete   Above message provided to pt via phone. Verbalizes understanding & appreciation. dph

## 2016-05-10 ENCOUNTER — Encounter (HOSPITAL_COMMUNITY): Payer: Self-pay | Admitting: *Deleted

## 2016-05-10 NOTE — Anesthesia Preprocedure Evaluation (Addendum)
Anesthesia Evaluation  Patient identified by MRN, date of birth, ID band Patient awake    Reviewed: Allergy & Precautions, NPO status , Patient's Chart, lab work & pertinent test results  History of Anesthesia Complications (+) PONV  Airway Mallampati: II  TM Distance: >3 FB Neck ROM: Full    Dental  (+) Dental Advisory Given   Pulmonary neg pulmonary ROS,    breath sounds clear to auscultation       Cardiovascular hypertension, Pt. on medications  Rhythm:Regular Rate:Normal     Neuro/Psych  Headaches,    GI/Hepatic Neg liver ROS, hiatal hernia,   Endo/Other  diabetes  Renal/GU CRFRenal disease     Musculoskeletal   Abdominal   Peds  Hematology   Anesthesia Other Findings   Reproductive/Obstetrics                            Lab Results  Component Value Date   WBC 7.6 04/12/2016   HGB 10.0 (L) 04/12/2016   HCT 31.6 (L) 04/12/2016   MCV 94 04/12/2016   PLT 261 04/12/2016   Lab Results  Component Value Date   CREATININE 1.4 (H) 04/12/2016   BUN 39.2 (H) 04/12/2016   NA 139 04/12/2016   K 5.8 (H) 04/12/2016   CL 109 04/11/2016   CO2 21 (L) 04/12/2016    Anesthesia Physical Anesthesia Plan  ASA: III  Anesthesia Plan: General   Post-op Pain Management:    Induction: Intravenous  Airway Management Planned: Oral ETT  Additional Equipment:   Intra-op Plan:   Post-operative Plan: Extubation in OR  Informed Consent: I have reviewed the patients History and Physical, chart, labs and discussed the procedure including the risks, benefits and alternatives for the proposed anesthesia with the patient or authorized representative who has indicated his/her understanding and acceptance.   Dental advisory given  Plan Discussed with: CRNA  Anesthesia Plan Comments: (Severe PONV. Plan on TIVA with routine monitors.)       Anesthesia Quick Evaluation

## 2016-05-10 NOTE — Progress Notes (Signed)
Spoke with pt for pre-op call. Pt's surgery was cancelled in October for high potassium level of 6.3. Pt has seen her hematologist and he states that her potassium runs high and is normal for her. See his note in EPIC. Pt is diabetic. Instructed her not to take her oral diabetic medications in the AM. Did instruct her to check her blood sugar in AM. If blood sugar is 70 or below, treat with 1/2 cup of clear juice (apple or cranberry) and recheck blood sugar 15 minutes after drinking juice. If blood sugar continues to be 70 or below, call the Short Stay department and ask to speak to a nurse.

## 2016-05-11 ENCOUNTER — Ambulatory Visit (HOSPITAL_COMMUNITY): Payer: BLUE CROSS/BLUE SHIELD | Admitting: Anesthesiology

## 2016-05-11 ENCOUNTER — Ambulatory Visit (HOSPITAL_COMMUNITY)
Admission: RE | Admit: 2016-05-11 | Discharge: 2016-05-11 | Disposition: A | Discharge status: Home / Self Care | Payer: BLUE CROSS/BLUE SHIELD | Source: Ambulatory Visit | Attending: Surgery | Admitting: Surgery

## 2016-05-11 ENCOUNTER — Encounter (HOSPITAL_COMMUNITY): Payer: Self-pay | Admitting: Urology

## 2016-05-11 ENCOUNTER — Encounter (HOSPITAL_COMMUNITY)
Admission: RE | Disposition: A | Discharge status: Home / Self Care | Payer: Self-pay | Source: Ambulatory Visit | Attending: Surgery

## 2016-05-11 DIAGNOSIS — Z9884 Bariatric surgery status: Secondary | ICD-10-CM | POA: Diagnosis not present

## 2016-05-11 DIAGNOSIS — I10 Essential (primary) hypertension: Secondary | ICD-10-CM | POA: Diagnosis not present

## 2016-05-11 DIAGNOSIS — G4730 Sleep apnea, unspecified: Secondary | ICD-10-CM | POA: Diagnosis not present

## 2016-05-11 DIAGNOSIS — E119 Type 2 diabetes mellitus without complications: Secondary | ICD-10-CM | POA: Diagnosis not present

## 2016-05-11 DIAGNOSIS — K824 Cholesterolosis of gallbladder: Secondary | ICD-10-CM | POA: Insufficient documentation

## 2016-05-11 DIAGNOSIS — K219 Gastro-esophageal reflux disease without esophagitis: Secondary | ICD-10-CM | POA: Insufficient documentation

## 2016-05-11 DIAGNOSIS — K811 Chronic cholecystitis: Secondary | ICD-10-CM | POA: Diagnosis not present

## 2016-05-11 DIAGNOSIS — Z90710 Acquired absence of both cervix and uterus: Secondary | ICD-10-CM | POA: Diagnosis not present

## 2016-05-11 HISTORY — PX: CHOLECYSTECTOMY: SHX55

## 2016-05-11 LAB — CBC
HCT: 38.7 % (ref 36.0–46.0)
Hemoglobin: 11.9 g/dL — ABNORMAL LOW (ref 12.0–15.0)
MCH: 29.2 pg (ref 26.0–34.0)
MCHC: 30.7 g/dL (ref 30.0–36.0)
MCV: 94.9 fL (ref 78.0–100.0)
Platelets: 219 10*3/uL (ref 150–400)
RBC: 4.08 MIL/uL (ref 3.87–5.11)
RDW: 15.1 % (ref 11.5–15.5)
WBC: 5.3 10*3/uL (ref 4.0–10.5)

## 2016-05-11 LAB — POCT I-STAT 4, (NA,K, GLUC, HGB,HCT)
Glucose, Bld: 157 mg/dL — ABNORMAL HIGH (ref 65–99)
HCT: 39 % (ref 36.0–46.0)
Hemoglobin: 13.3 g/dL (ref 12.0–15.0)
Potassium: 5.3 mmol/L — ABNORMAL HIGH (ref 3.5–5.1)
Sodium: 140 mmol/L (ref 135–145)

## 2016-05-11 LAB — GLUCOSE, CAPILLARY
Glucose-Capillary: 154 mg/dL — ABNORMAL HIGH (ref 65–99)
Glucose-Capillary: 199 mg/dL — ABNORMAL HIGH (ref 65–99)

## 2016-05-11 LAB — BASIC METABOLIC PANEL
Anion gap: 5 (ref 5–15)
BUN: 41 mg/dL — ABNORMAL HIGH (ref 6–20)
CO2: 23 mmol/L (ref 22–32)
Calcium: 9.5 mg/dL (ref 8.9–10.3)
Chloride: 109 mmol/L (ref 101–111)
Creatinine, Ser: 1.46 mg/dL — ABNORMAL HIGH (ref 0.44–1.00)
GFR calc Af Amer: 44 mL/min — ABNORMAL LOW (ref >60)
GFR calc non Af Amer: 38 mL/min — ABNORMAL LOW (ref >60)
Glucose, Bld: 161 mg/dL — ABNORMAL HIGH (ref 65–99)
Potassium: 5.1 mmol/L (ref 3.5–5.1)
Sodium: 137 mmol/L (ref 135–145)

## 2016-05-11 SURGERY — LAPAROSCOPIC CHOLECYSTECTOMY WITH INTRAOPERATIVE CHOLANGIOGRAM
Anesthesia: General | Site: Abdomen

## 2016-05-11 MED ORDER — HYDROMORPHONE HCL 2 MG/ML IJ SOLN
INTRAMUSCULAR | Status: AC
  Filled 2016-05-11: qty 1

## 2016-05-11 MED ORDER — FENTANYL CITRATE (PF) 100 MCG/2ML IJ SOLN
INTRAMUSCULAR | Status: AC
  Filled 2016-05-11: qty 2

## 2016-05-11 MED ORDER — LIDOCAINE HCL (CARDIAC) 20 MG/ML IV SOLN
INTRAVENOUS | Status: DC | PRN
  Administered 2016-05-11: 40 mg via INTRAVENOUS

## 2016-05-11 MED ORDER — PROPOFOL 10 MG/ML IV BOLUS
INTRAVENOUS | Status: AC
  Filled 2016-05-11: qty 20

## 2016-05-11 MED ORDER — LIDOCAINE 2% (20 MG/ML) 5 ML SYRINGE
INTRAMUSCULAR | Status: AC
  Filled 2016-05-11: qty 5

## 2016-05-11 MED ORDER — HYDROMORPHONE HCL 1 MG/ML IJ SOLN
INTRAMUSCULAR | Status: AC
  Filled 2016-05-11: qty 1

## 2016-05-11 MED ORDER — IOPAMIDOL (ISOVUE-300) INJECTION 61%
INTRAVENOUS | Status: AC
  Filled 2016-05-11: qty 50

## 2016-05-11 MED ORDER — ONDANSETRON HCL 4 MG/2ML IJ SOLN
INTRAMUSCULAR | Status: AC
  Filled 2016-05-11: qty 2

## 2016-05-11 MED ORDER — ROCURONIUM BROMIDE 100 MG/10ML IV SOLN
INTRAVENOUS | Status: DC | PRN
  Administered 2016-05-11: 30 mg via INTRAVENOUS
  Administered 2016-05-11: 40 mg via INTRAVENOUS

## 2016-05-11 MED ORDER — PROMETHAZINE HCL 25 MG/ML IJ SOLN: 6.2500 mg | INTRAMUSCULAR | Status: DC | PRN

## 2016-05-11 MED ORDER — DIPHENHYDRAMINE HCL 50 MG/ML IJ SOLN
INTRAMUSCULAR | Status: DC | PRN
  Administered 2016-05-11: 25 mg via INTRAVENOUS

## 2016-05-11 MED ORDER — DEXAMETHASONE SODIUM PHOSPHATE 10 MG/ML IJ SOLN
INTRAMUSCULAR | Status: DC | PRN
  Administered 2016-05-11: 10 mg via INTRAVENOUS

## 2016-05-11 MED ORDER — OXYCODONE-ACETAMINOPHEN 5-325 MG PO TABS: 1.0000 | ORAL_TABLET | ORAL | 0 refills | Status: DC | PRN

## 2016-05-11 MED ORDER — CHLORHEXIDINE GLUCONATE CLOTH 2 % EX PADS: 6.0000 | MEDICATED_PAD | Freq: Once | CUTANEOUS | Status: DC

## 2016-05-11 MED ORDER — ONDANSETRON HCL 4 MG/2ML IJ SOLN
INTRAMUSCULAR | Status: DC | PRN
  Administered 2016-05-11: 4 mg via INTRAVENOUS

## 2016-05-11 MED ORDER — SCOPOLAMINE 1 MG/3DAYS TD PT72
1.0000 | MEDICATED_PATCH | TRANSDERMAL | Status: DC
  Administered 2016-05-11: 1.5 mg via TRANSDERMAL

## 2016-05-11 MED ORDER — DIPHENHYDRAMINE HCL 50 MG/ML IJ SOLN
INTRAMUSCULAR | Status: AC
  Filled 2016-05-11: qty 1

## 2016-05-11 MED ORDER — CEFAZOLIN IN D5W 1 GM/50ML IV SOLN: 1.0000 g | Freq: Once | INTRAVENOUS | Status: DC

## 2016-05-11 MED ORDER — LACTATED RINGERS IV SOLN
INTRAVENOUS | Status: DC | PRN
  Administered 2016-05-11 (×2): via INTRAVENOUS

## 2016-05-11 MED ORDER — ROCURONIUM BROMIDE 10 MG/ML (PF) SYRINGE
PREFILLED_SYRINGE | INTRAVENOUS | Status: AC
Start: 2016-05-11 — End: 2016-05-11
  Filled 2016-05-11: qty 10

## 2016-05-11 MED ORDER — HYDROMORPHONE HCL 1 MG/ML IJ SOLN
0.5000 mg | INTRAMUSCULAR | Status: DC | PRN
  Administered 2016-05-11 (×2): 0.5 mg via INTRAVENOUS

## 2016-05-11 MED ORDER — FENTANYL CITRATE (PF) 100 MCG/2ML IJ SOLN
INTRAMUSCULAR | Status: DC | PRN
  Administered 2016-05-11: 100 ug via INTRAVENOUS
  Administered 2016-05-11 (×2): 50 ug via INTRAVENOUS
  Administered 2016-05-11: 100 ug via INTRAVENOUS

## 2016-05-11 MED ORDER — BUPIVACAINE HCL (PF) 0.25 % IJ SOLN
INTRAMUSCULAR | Status: AC
Start: 2016-05-11 — End: 2016-05-11
  Filled 2016-05-11: qty 30

## 2016-05-11 MED ORDER — PROPOFOL 500 MG/50ML IV EMUL
INTRAVENOUS | Status: DC | PRN
  Administered 2016-05-11: 150 ug/kg/min via INTRAVENOUS

## 2016-05-11 MED ORDER — SUGAMMADEX SODIUM 200 MG/2ML IV SOLN
INTRAVENOUS | Status: AC
  Filled 2016-05-11: qty 2

## 2016-05-11 MED ORDER — 0.9 % SODIUM CHLORIDE (POUR BTL) OPTIME
TOPICAL | Status: DC | PRN
Start: 2016-05-11 — End: 2016-05-11
  Administered 2016-05-11: 1000 mL

## 2016-05-11 MED ORDER — MIDAZOLAM HCL 2 MG/2ML IJ SOLN
INTRAMUSCULAR | Status: AC
  Filled 2016-05-11: qty 2

## 2016-05-11 MED ORDER — ESMOLOL HCL 100 MG/10ML IV SOLN
INTRAVENOUS | Status: DC | PRN
  Administered 2016-05-11: 30 mg via INTRAVENOUS

## 2016-05-11 MED ORDER — HYDROMORPHONE HCL 1 MG/ML IJ SOLN
INTRAMUSCULAR | Status: DC | PRN
  Administered 2016-05-11: 1 mg via INTRAVENOUS

## 2016-05-11 MED ORDER — SUGAMMADEX SODIUM 200 MG/2ML IV SOLN
INTRAVENOUS | Status: DC | PRN
  Administered 2016-05-11: 200 mg via INTRAVENOUS

## 2016-05-11 MED ORDER — SODIUM CHLORIDE 0.9 % IR SOLN
Status: DC | PRN
  Administered 2016-05-11 (×2): 1000 mL

## 2016-05-11 MED ORDER — BUPIVACAINE HCL (PF) 0.25 % IJ SOLN
INTRAMUSCULAR | Status: DC | PRN
Start: 2016-05-11 — End: 2016-05-11
  Administered 2016-05-11: 7 mL

## 2016-05-11 MED ORDER — HYDROMORPHONE HCL 1 MG/ML IJ SOLN
INTRAMUSCULAR | Status: AC
  Filled 2016-05-11: qty 0.5

## 2016-05-11 MED ORDER — PROPOFOL 10 MG/ML IV BOLUS
INTRAVENOUS | Status: DC | PRN
  Administered 2016-05-11: 160 mg via INTRAVENOUS

## 2016-05-11 MED ORDER — SCOPOLAMINE 1 MG/3DAYS TD PT72
MEDICATED_PATCH | TRANSDERMAL | Status: AC
  Filled 2016-05-11: qty 1

## 2016-05-11 MED ORDER — MIDAZOLAM HCL 5 MG/5ML IJ SOLN
INTRAMUSCULAR | Status: DC | PRN
  Administered 2016-05-11: 2 mg via INTRAVENOUS

## 2016-05-11 MED ORDER — DEXTROSE 5 % IV SOLN
3.0000 g | INTRAVENOUS | Status: AC
  Administered 2016-05-11: 3 g via INTRAVENOUS
  Filled 2016-05-11: qty 3000

## 2016-05-11 MED ORDER — DEXAMETHASONE SODIUM PHOSPHATE 10 MG/ML IJ SOLN
INTRAMUSCULAR | Status: AC
  Filled 2016-05-11: qty 1

## 2016-05-11 MED ORDER — HYDROMORPHONE HCL 1 MG/ML IJ SOLN
0.2500 mg | INTRAMUSCULAR | Status: DC | PRN
  Administered 2016-05-11 (×4): 0.5 mg via INTRAVENOUS

## 2016-05-11 SURGICAL SUPPLY — 49 items
ADH SKN CLS APL DERMABOND .7 (GAUZE/BANDAGES/DRESSINGS) ×1
APPLIER CLIP ROT 10 11.4 M/L (STAPLE) ×2
APR CLP MED LRG 11.4X10 (STAPLE) ×1
BAG SPEC RTRVL LRG 6X4 10 (ENDOMECHANICALS) ×1
BLADE SURG ROTATE 9660 (MISCELLANEOUS) IMPLANT
CANISTER SUCTION 2500CC (MISCELLANEOUS) ×2 IMPLANT
CHLORAPREP W/TINT 26ML (MISCELLANEOUS) ×2 IMPLANT
CLIP APPLIE ROT 10 11.4 M/L (STAPLE) ×1 IMPLANT
COVER MAYO STAND STRL (DRAPES) ×2 IMPLANT
COVER SURGICAL LIGHT HANDLE (MISCELLANEOUS) ×2 IMPLANT
DERMABOND ADVANCED (GAUZE/BANDAGES/DRESSINGS) ×1
DERMABOND ADVANCED .7 DNX12 (GAUZE/BANDAGES/DRESSINGS) ×1 IMPLANT
DRAPE C-ARM 42X72 X-RAY (DRAPES) ×2 IMPLANT
DRAPE WARM FLUID 44X44 (DRAPE) ×2 IMPLANT
ELECT REM PT RETURN 9FT ADLT (ELECTROSURGICAL) ×2
ELECTRODE REM PT RTRN 9FT ADLT (ELECTROSURGICAL) ×1 IMPLANT
GLOVE BIO SURGEON STRL SZ7.5 (GLOVE) ×1 IMPLANT
GLOVE BIO SURGEON STRL SZ8 (GLOVE) ×2 IMPLANT
GLOVE BIOGEL PI IND STRL 7.0 (GLOVE) ×1 IMPLANT
GLOVE BIOGEL PI IND STRL 7.5 (GLOVE) ×1 IMPLANT
GLOVE BIOGEL PI IND STRL 8 (GLOVE) ×1 IMPLANT
GLOVE BIOGEL PI INDICATOR 7.0 (GLOVE) ×1
GLOVE BIOGEL PI INDICATOR 7.5 (GLOVE) ×1
GLOVE BIOGEL PI INDICATOR 8 (GLOVE) ×1
GLOVE INDICATOR 7.0 STRL GRN (GLOVE) ×2 IMPLANT
GLOVE SURG SS PI 6.5 STRL IVOR (GLOVE) ×1 IMPLANT
GLOVE SURG SS PI 7.0 STRL IVOR (GLOVE) ×1 IMPLANT
GOWN STRL REUS W/ TWL LRG LVL3 (GOWN DISPOSABLE) ×2 IMPLANT
GOWN STRL REUS W/ TWL XL LVL3 (GOWN DISPOSABLE) ×1 IMPLANT
GOWN STRL REUS W/TWL LRG LVL3 (GOWN DISPOSABLE) ×4
GOWN STRL REUS W/TWL XL LVL3 (GOWN DISPOSABLE) ×2
KIT BASIN OR (CUSTOM PROCEDURE TRAY) ×2 IMPLANT
KIT ROOM TURNOVER OR (KITS) ×2 IMPLANT
NS IRRIG 1000ML POUR BTL (IV SOLUTION) ×2 IMPLANT
PAD ARMBOARD 7.5X6 YLW CONV (MISCELLANEOUS) ×2 IMPLANT
POUCH SPECIMEN RETRIEVAL 10MM (ENDOMECHANICALS) ×2 IMPLANT
SCISSORS LAP 5X35 DISP (ENDOMECHANICALS) ×2 IMPLANT
SET CHOLANGIOGRAPH 5 50 .035 (SET/KITS/TRAYS/PACK) ×2 IMPLANT
SET IRRIG TUBING LAPAROSCOPIC (IRRIGATION / IRRIGATOR) ×2 IMPLANT
SLEEVE ENDOPATH XCEL 5M (ENDOMECHANICALS) ×2 IMPLANT
SPECIMEN JAR SMALL (MISCELLANEOUS) ×2 IMPLANT
SUT MNCRL AB 4-0 PS2 18 (SUTURE) ×2 IMPLANT
TOWEL OR 17X24 6PK STRL BLUE (TOWEL DISPOSABLE) ×2 IMPLANT
TOWEL OR 17X26 10 PK STRL BLUE (TOWEL DISPOSABLE) ×2 IMPLANT
TRAY LAPAROSCOPIC MC (CUSTOM PROCEDURE TRAY) ×2 IMPLANT
TROCAR XCEL BLUNT TIP 100MML (ENDOMECHANICALS) ×2 IMPLANT
TROCAR XCEL NON-BLD 11X100MML (ENDOMECHANICALS) ×2 IMPLANT
TROCAR XCEL NON-BLD 5MMX100MML (ENDOMECHANICALS) ×2 IMPLANT
TUBING INSUFFLATION (TUBING) ×2 IMPLANT

## 2016-05-11 NOTE — Anesthesia Procedure Notes (Signed)
Procedure Name: Intubation Date/Time: 05/11/2016 7:39 AM Performed by: Babs Bertin Pre-anesthesia Checklist: Patient identified, Emergency Drugs available, Suction available, Patient being monitored and Timeout performed Patient Re-evaluated:Patient Re-evaluated prior to inductionOxygen Delivery Method: Circle system utilized Preoxygenation: Pre-oxygenation with 100% oxygen Intubation Type: IV induction Ventilation: Mask ventilation without difficulty Laryngoscope Size: Mac and 3 Grade View: Grade I Tube type: Oral Number of attempts: 2 (first attempt by T. Neilson RN esophageal intubation, second attempt by C. Brylee Mcgreal, CRNA grade one view) Airway Equipment and Method: Stylet Placement Confirmation: ETT inserted through vocal cords under direct vision,  positive ETCO2 and breath sounds checked- equal and bilateral Secured at: 21 cm Tube secured with: Tape Dental Injury: Teeth and Oropharynx as per pre-operative assessment

## 2016-05-11 NOTE — Interval H&P Note (Signed)
History and Physical Interval Note:  05/11/2016 7:06 AM  Kelly Soto  has presented today for surgery, with the diagnosis of GALLBLADDER POLYP  The various methods of treatment have been discussed with the patient and family. After consideration of risks, benefits and other options for treatment, the patient has consented to  Procedure(s): LAPAROSCOPIC CHOLECYSTECTOMY WITH INTRAOPERATIVE CHOLANGIOGRAM (N/A) as a surgical intervention .  The patient's history has been reviewed, patient examined, no change in status, stable for surgery.  I have reviewed the patient's chart and labs.  Questions were answered to the patient's satisfaction.     Kyley Laurel A.

## 2016-05-11 NOTE — Op Note (Signed)
Laparoscopic Cholecystectomy  Procedure Note  Indications: This patient presents with symptomatic gallbladder disease and will undergo laparoscopic cholecystectomy.   The procedure has been discussed with the patient. Operative and non operative treatments have been discussed. Risks of surgery include bleeding, infection,  Common bile duct injury,  Injury to the stomach,liver, colon,small intestine, abdominal wall,  Diaphragm,  Major blood vessels,  And the need for an open procedure.  Other risks include worsening of medical problems, death,  DVT and pulmonary embolism, and cardiovascular events.   Medical options have also been discussed. The patient has been informed of long term expectations of surgery and non surgical options,  The patient agrees to proceed.    Pre-operative Diagnosis: gallbladder polyp   Post-operative Diagnosis: Same  Surgeon: Jakerria Kingbird A.   Assistants: Deon Pilling RNFA   Anesthesia: General endotracheal anesthesia and Local anesthesia 0.25.% bupivacaine  ASA Class: 2  Procedure Details  The patient was seen again in the Holding Room. The risks, benefits, complications, treatment options, and expected outcomes were discussed with the patient. The possibilities of reaction to medication, pulmonary aspiration, perforation of viscus, bleeding, recurrent infection, finding a normal gallbladder, the need for additional procedures, failure to diagnose a condition, the possible need to convert to an open procedure, and creating a complication requiring transfusion or operation were discussed with the patient. The patient and/or family concurred with the proposed plan, giving informed consent. The site of surgery properly noted/marked. The patient was taken to Operating Room, identified as Kelly Soto and the procedure verified as Laparoscopic Cholecystectomy with Intraoperative Cholangiograms. A Time Out was held and the above information confirmed.  Prior to the induction of  general anesthesia, antibiotic prophylaxis was administered. General endotracheal anesthesia was then administered and tolerated well. After the induction, the abdomen was prepped in the usual sterile fashion. The patient was positioned in the supine position with the left arm comfortably tucked, along with some reverse Trendelenburg.  Local anesthetic agent was injected into the skin near the umbilicus and an incision made. The midline fascia was incised and the Hasson technique was used to introduce a 12 mm port under direct vision. It was secured with a figure of eight Vicryl suture placed in the usual fashion. Pneumoperitoneum was then created with CO2 and tolerated well without any adverse changes in the patient's vital signs. Additional trocars were introduced under direct vision with an 11 mm trocar in the epigastrium and two  5 mm trocars in the right upper quadrant. All skin incisions were infiltrated with a local anesthetic agent before making the incision and placing the trocars.   The gallbladder was identified, the fundus grasped and retracted cephalad. Adhesions were lysed bluntly and with the electrocautery where indicated, taking care not to injure any adjacent organs or viscus. The infundibulum was grasped and retracted laterally, exposing the peritoneum overlying the triangle of Calot. This was then divided and exposed in a blunt fashion. The cystic duct was clearly identified and bluntly dissected circumferentially. The junctions of the gallbladder, cystic duct and common bile duct were clearly identified prior to the division of any linear structure.   The anatomy was clear and LFT's were normal.   She only had polyps on U/S and therefore I did not do an IOC.   The cystic duct was then  ligated with surgical clips  on the patient side and  clipped on the gallbladder side and divided. The cystic artery was identified, dissected free, ligated with clips and divided as  well. Posterior  cystic artery clipped and divided.  The gallbladder was dissected from the liver bed in retrograde fashion with the electrocautery. The gallbladder was removed. The liver bed was irrigated and inspected. Hemostasis was achieved with the electrocautery. Copious irrigation was utilized and was repeatedly aspirated until clear all particulate matter. Hemostasis was achieved with no signs of bleeding or bile leakage.  Pneumoperitoneum was completely reduced after viewing removal of the trocars under direct vision. The wound was thoroughly irrigated and the fascia was then closed with a figure of eight suture; the skin was then closed with 4 0 Monocryl  and a sterile dressing of liquid adhesive  was applied.  Instrument, sponge, and needle counts were correct at closure and at the conclusion of the case.   Findings: GALLBLADDER POLYPS    Estimated Blood Loss: less than 50 mL         Drains: NONE          Total IV Fluids: 800 mL         Specimens: Gallbladder           Complications: None; patient tolerated the procedure well.         Disposition: PACU - hemodynamically stable.         Condition: stable

## 2016-05-11 NOTE — Progress Notes (Signed)
Report given to robin roberts rn as caregiver 

## 2016-05-11 NOTE — Anesthesia Postprocedure Evaluation (Signed)
Anesthesia Post Note  Patient: Kelly Soto  Procedure(s) Performed: Procedure(s) (LRB): LAPAROSCOPIC CHOLECYSTECTOMY (N/A)  Patient location during evaluation: PACU Anesthesia Type: General Level of consciousness: awake and alert Pain management: pain level controlled Vital Signs Assessment: post-procedure vital signs reviewed and stable Respiratory status: spontaneous breathing, nonlabored ventilation, respiratory function stable and patient connected to nasal cannula oxygen Cardiovascular status: blood pressure returned to baseline and stable Postop Assessment: no signs of nausea or vomiting Anesthetic complications: no    Last Vitals:  Vitals:   05/11/16 1015 05/11/16 1100  BP: (!) 163/89 (!) 159/76  Pulse: 87 73  Resp: 19 18  Temp:      Last Pain:  Vitals:   05/11/16 1015  TempSrc:   PainSc: Tyler Deis

## 2016-05-11 NOTE — Transfer of Care (Signed)
Immediate Anesthesia Transfer of Care Note  Patient: Kelly Soto  Procedure(s) Performed: Procedure(s): LAPAROSCOPIC CHOLECYSTECTOMY (N/A)  Patient Location: PACU  Anesthesia Type:General  Level of Consciousness: awake and alert   Airway & Oxygen Therapy: Patient Spontanous Breathing and Patient connected to nasal cannula oxygen  Post-op Assessment: Report given to RN and Post -op Vital signs reviewed and stable  Post vital signs: Reviewed and stable  Last Vitals:  Vitals:   05/11/16 0624  BP: (!) 168/72  Pulse: 74  Resp: 20  Temp: 36.8 C    Last Pain:  Vitals:   05/11/16 0624  TempSrc: Oral         Complications: No apparent anesthesia complications

## 2016-05-11 NOTE — H&P (Signed)
Pre-op/Pre-procedure Orders Open     [facility]  Kelly Luna, Kelly Soto  General Surgery   Additional Documentation   Encounter Info:   Billing Info,   History,   Allergies,   Detailed Report     All Notes   H&P by Kelly Luna, Kelly Soto    Author: Erroll Luna, Kelly Soto Author Type: Physician Filed:    Note Status: Signed Cosign: Cosign Not Required Encounter Date: 50 1:45 PM  Editor: Kelly Luna, Kelly Soto (Physician)    Kelly Soto. [facility]  Location: [facility] Patient #: [id number] DOB: 1956-05-03 Married / Language: English / Race: White Female  History of Present Illness Kelly Soto;  PM) Patient words: gb   Patient sent at the request of Dr. Serita Grammes for 2 month history of epigastric abdominal pain, nausea and diarrhea. Patient is a history of a gastric bypass done in 2008 by Dr. Hassell Done. She lost over 100 pounds. Over the last year she's had intermittent epigastric pain after eating with some nausea. Has a history of an upper GI bleed 5 years ago but has had no problems since. Her most recent endoscopy was 5 years ago. She's now developed right upper quadrant pain after eating. She does have some nausea and diarrhea after eating fatty greasy foods. Pain is made better by eating low-fat diet. Her weight is otherwise been stable.                  CLINICAL DATA: Abdominal pain and nausea for 1 week  EXAM: US ABDOMEN LIMITED - RIGHT UPPER QUADRANT  COMPARISON: January 05, 2014  FINDINGS: Gallbladder:  Within the gallbladder, there is a 7 mm echogenic focus along the posterior wall, which was present previously and may have increased in size slightly compared to previous study. This lesion measured just under 6 mm on prior study by my remeasurement. A second apparent polyp measures 2 mm. There are no echogenic foci which move and shadow as is expected with gallstones. No gallbladder wall thickening  or pericholecystic fluid. No sonographic Murphy sign noted by sonographer.  Common bile duct:  Diameter: 3 mm. No intrahepatic or extrahepatic biliary duct dilatation.  Liver:  There is a stable uniformly echogenic focus in the anterior segment right lobe of the liver measuring 8 x 7 x 8 mm, stable. There is a 4 x 4 x 4 mm echogenic focus in the right lobe of the liver near the dome which has not been demonstrated previously. Within normal limits in parenchymal echogenicity.  IMPRESSION: There are gallbladder polyps. The largest polyp has become marginally larger compared to 2 years prior. It measures approximately 7 mm currently. Given an apparent increase in this polyp, surgical consultation is felt to be warranted based on consensus guidelines. Note that there are no gallstones or gallbladder wall thickening.  **An incidental finding of potential clinical significance has been found. Questionably new 4 x 4 mm echogenic focus in the right lobe of the liver. Stable apparent hemangioma in the anterior segment right lobe measuring 8 mm. Given a questionable new albeit benign appearing liver lesion, follow-up ultrasound in approximately 1 year advised to assess for stability.**   Electronically Signed By: Kelly Grip III M.D. On: 03/15/2016 09:12.  The patient is a 60 year old female.   Other Problems Kelly Soto, Kelly Soto;  PM) Anxiety Disorder Depression Diabetes Mellitus Gastric Ulcer Gastroesophageal Reflux Disease High blood pressure Migraine Headache Sleep Apnea Transfusion history  Past Surgical History Kelly Soto,  Kelly Soto;  Gastric Bypass Hysterectomy (not due to cancer) - Partial  Diagnostic Studies History Kelly Soto, Mammogram 1-3 years ago  Allergies Kelly Soto, Kelly Soto;  ACE Inhibitors NSAIDs  Medication History Kelly Soto, Kelly Soto;  1:21 PM) Topiramate (Oral) Specific dose unknown -  Active. Baclofen (10MG  Tablet, Oral) Active. Olmesartan Medoxomil-HCTZ (20-12.5MG  Tablet, Oral) Active. Omeprazole (20MG  Capsule DR, Oral) Active. Jardiance (25MG  Tablet, Oral) Active. Fluticasone Propionate (50MCG/ACT Suspension, Nasal) Active. MetFORMIN HCl (500MG  Tablet, Oral) Active. Promethazine HCl (25MG  Tablet, Oral) Active. Medications Reconciled  Social History Kelly Soto, Kelly Soto;  1:19 PM) Caffeine use Coffee. No alcohol use No drug use Tobacco use Never smoker.  Family History Kelly Soto, [place];  1:19 PM) Cancer Family Members In General. Colon Polyps Family Members In General. Depression Family Members In General, Mother. Heart Disease Father, Mother. Heart disease in female family member before age 16 Thyroid problems Mother.  Pregnancy / Birth History Kelly Soto, Kelly Soto;  1:19 PM) Age at menarche 42 years. Age of menopause <44 Gravida 4 Maternal age 51-25 Para 4     Review of Systems ([place]. [place] Kelly Soto;  1:19 PM) General Present- Appetite Loss, Fatigue and Weight Loss. Not Present- Chills, Fever, Night Sweats and Weight Gain. Skin Not Present- Change in Wart/Mole, Dryness, Hives, Jaundice, New Lesions, Non-Healing Wounds, Rash and Ulcer. HEENT Present- Wears glasses/contact lenses. Not Present- Earache, Hearing Loss, Hoarseness, Nose Bleed, Oral Ulcers, Ringing in the Ears, Seasonal Allergies, Sinus Pain, Sore Throat, Visual Disturbances and Yellow Eyes. Respiratory Not Present- Bloody sputum, Chronic Cough, Difficulty Breathing, Snoring and Wheezing. Breast Not Present- Breast Mass, Breast Pain, Nipple Discharge and Skin Changes. Cardiovascular Not Present- Chest Pain, Difficulty Breathing Lying Down, Leg Cramps, Palpitations, Rapid Heart Rate, Shortness of Breath and Swelling of Extremities. Gastrointestinal Present- Abdominal Pain, Change in Bowel Habits, Excessive gas and Nausea. Not Present- Bloating, Bloody  Stool, Chronic diarrhea, Constipation, Difficulty Swallowing, Gets full quickly at meals, Hemorrhoids, Indigestion, Rectal Pain and Vomiting. Female Genitourinary Not Present- Frequency, Nocturia, Painful Urination, Pelvic Pain and Urgency. Musculoskeletal Not Present- Back Pain, Joint Pain, Joint Stiffness, Muscle Pain, Muscle Weakness and Swelling of Extremities. Neurological Present- Weakness. Not Present- Decreased Memory, Fainting, Headaches, Numbness, Seizures, Tingling, Tremor and Trouble walking. Psychiatric Not Present- Anxiety, Bipolar, Change in Sleep Pattern, Depression, Fearful and Frequent crying. Endocrine Not Present- Cold Intolerance, Excessive Hunger, Hair Changes, Heat Intolerance, Hot flashes and New Diabetes. Hematology Not Present- Blood Thinners, Easy Bruising, Excessive bleeding, Gland problems, HIV and Persistent Infections.  Vitals Kelly Soto;  1:19 PM) 03/20/2016 1:19 PM Weight: 122.25 lb Height: 61in Body Surface Area: 1.53 m Body Mass Index: 23.1 kg/m  BP: 126/74 (Sitting, Left Arm, Standard)      Physical Exam (Kelly Soto;  2:25 PM)  General Mental Status-Alert. General Appearance-Consistent with stated age. Hydration-Well hydrated. Voice-Normal.  Head and Neck Head-normocephalic, atraumatic with no lesions or palpable masses. Trachea-midline. Thyroid Gland Characteristics - normal size and consistency.  Chest and Lung Exam Chest and lung exam reveals -quiet, even and easy respiratory effort with no use of accessory muscles and on auscultation, normal breath sounds, no adventitious sounds and normal vocal resonance. Inspection Chest Wall - Normal. Back - normal.  Cardiovascular Cardiovascular examination reveals -normal heart sounds, regular rate and rhythm with no murmurs and normal pedal pulses bilaterally.  Abdomen Note: Scar from gastric bypass noted. Soft. Mild right upper quadrant  tenderness noted. Negative Murphy sign.  Musculoskeletal Normal Exam -  Left-Upper Extremity Strength Normal and Lower Extremity Strength Normal. Normal Exam - Right-Upper Extremity Strength Normal and Lower Extremity Strength Normal.    Assessment & Plan (Kelly Soto;  2:27 PM)  GALL BLADDER POLYP (K82.4) Impression: Discussed findings of ultrasound gallbladder polyp which is increased in size. She also has some sludge and other echogenic findings. Given her history and physical exam findings and discussed the benefit of cholecystectomy. I cautioned her on diarrhea though given her history of gastric bypass but her symptoms are worsening and beginning to impact her quality of life in her diet. I discussed possible upper endoscopy as well. Given the fact that she has a collar pulp its increasing in size I do feel she will need cholecystectomy at some point. I offered upper endoscopy first to her but I think she would like to proceed with cholecystectomy first and see how she feels. If her symptoms resolve, she will not require an upper endoscopy. If she continues to have issues she may need to be seen in follow-up for the bariatric surgeons at their initial surgery. I've offered her that as well. She would like to proceed with cholecystectomy at this point. The procedure has been discussed with the patient. Risks of laparoscopic cholecystectomy include bleeding, infection, bile duct injury, leak, death, open surgery, diarrhea, other surgery, organ injury, blood vessel injury, DVT, and additional care.  Current Plans You are being scheduled for surgery - Our schedulers will call you.  You should hear from our office's scheduling department within 5 working days about the location, date, and time of surgery. We try to make accommodations for patient's preferences in scheduling surgery, but sometimes the OR schedule or the surgeon's schedule prevents [place] from making those  accommodations.  If you have not heard from our office [phone]) in 5 working days, call the office and ask for your surgeon's nurse.  If you have other questions about your diagnosis, plan, or surgery, call the office and ask for your surgeon's nurse.  Pt Education - Pamphlet Given - Laparoscopic Gallbladder Surgery: discussed with patient and provided information. The anatomy & physiology of hepatobiliary & pancreatic function was discussed. The pathophysiology of gallbladder dysfunction was discussed. Natural history risks without surgery was discussed. I feel the risks of no intervention will lead to serious problems that outweigh the operative risks; therefore, I recommended cholecystectomy to remove the pathology. I explained laparoscopic techniques with possible need for an open approach. Probable cholangiogram to evaluate the bilary tract was explained as well.  Risks such as bleeding, infection, abscess, leak, injury to other organs, need for further treatment, heart attack, death, and other risks were discussed. I noted a good likelihood this will help address the problem. Possibility that this will not correct all abdominal symptoms was explained. Goals of post-operative recovery were discussed as well. We will work to minimize complications. An educational handout further explaining the pathology and treatment options was given as well. Questions were answered. The patient expresses understanding & wishes to proceed with surgery.  Pt Education - [facility] Laparosopic Post Op HCI ([place]) Pt Education - Laparoscopic Cholecystectomy: gallbladder HISTORY OF GASTRIC BYPASS (Z98.890)

## 2016-05-11 NOTE — Discharge Instructions (Signed)
[facility] ______[facility] °LAPAROSCOPIC SURGERY: POST OP INSTRUCTIONS °Always review your discharge instruction sheet given to you by the facility where your surgery was performed. °IF YOU HAVE DISABILITY OR FAMILY LEAVE FORMS, YOU MUST BRING THEM TO THE OFFICE FOR PROCESSING.   °DO NOT GIVE THEM TO YOUR DOCTOR. ° °1. A prescription for pain medication may be given to you upon discharge.  Take your pain medication as prescribed, if needed.  If narcotic pain medicine is not needed, then you may take acetaminophen (Tylenol) or ibuprofen (Advil) as needed. °2. Take your usually prescribed medications unless otherwise directed. °3. If you need a refill on your pain medication, please contact your pharmacy.  They will contact our office to request authorization. Prescriptions will not be filled after 5pm or on week-ends. °4. You should follow a light diet the first few days after arrival home, such as soup and crackers, etc.  Be sure to include lots of fluids daily. °5. Most patients will experience some swelling and bruising in the area of the incisions.  Ice packs will help.  Swelling and bruising can take several days to resolve.  °6. It is common to experience some constipation if taking pain medication after surgery.  Increasing fluid intake and taking a stool softener (such as Colace) will usually help or prevent this problem from occurring.  A mild laxative (Milk of Magnesia or Miralax) should be taken according to package instructions if there are no bowel movements after 48 hours. °7. Unless discharge instructions indicate otherwise, you may remove your bandages 24-48 hours after surgery, and you may shower at that time.  You may have steri-strips (small skin tapes) in place directly over the incision.  These strips should be left on the skin for 7-10 days.  If your surgeon used skin glue on the incision, you may shower in 24 hours.  The glue will flake off over the next 2-3 weeks.  Any sutures or  staples will be removed at the office during your follow-up visit. °8. ACTIVITIES:  You may resume regular (light) daily activities beginning the next day--such as daily self-care, walking, climbing stairs--gradually increasing activities as tolerated.  You may have sexual intercourse when it is comfortable.  Refrain from any heavy lifting or straining until approved by your doctor. °a. You may drive when you are no longer taking prescription pain medication, you can comfortably wear a seatbelt, and you can safely maneuver your car and apply brakes. °b. RETURN TO WORK:  __________________________________________________________ °9. You should see your doctor in the office for a follow-up appointment approximately 2-3 weeks after your surgery.  Make sure that you call for this appointment within a day or two after you arrive home to insure a convenient appointment time. °10. OTHER INSTRUCTIONS: __________________________________________________________________________________________________________________________ __________________________________________________________________________________________________________________________ °WHEN TO CALL YOUR DOCTOR: °1. Fever over 101.0 °2. Inability to urinate °3. Continued bleeding from incision. °4. Increased pain, redness, or drainage from the incision. °5. Increasing abdominal pain ° °The clinic staff is available to answer your questions during regular business hours.  Please don’t hesitate to call and ask to speak to one of the nurses for clinical concerns.  If you have a medical emergency, go to the nearest emergency room or call 911.  A surgeon from [facility] is always on call at the hospital. °[address], [address], [place], [place]  [address] ? [address] [address], [place], [place]   [address] °[phone] ? [phone] ? FAX [phone] °Web site:   [email or web address] °

## 2016-05-12 ENCOUNTER — Encounter (HOSPITAL_COMMUNITY): Payer: Self-pay | Admitting: Surgery

## 2016-05-12 NOTE — OR Nursing (Signed)
Dilaudid .5 mg wasted with Valentino Nose.

## 2016-05-23 ENCOUNTER — Ambulatory Visit: Payer: BLUE CROSS/BLUE SHIELD

## 2016-05-23 ENCOUNTER — Other Ambulatory Visit: Payer: BLUE CROSS/BLUE SHIELD

## 2016-05-23 ENCOUNTER — Ambulatory Visit: Payer: BLUE CROSS/BLUE SHIELD | Admitting: Hematology & Oncology

## 2016-05-24 ENCOUNTER — Other Ambulatory Visit: Payer: BLUE CROSS/BLUE SHIELD

## 2016-05-24 ENCOUNTER — Ambulatory Visit: Payer: BLUE CROSS/BLUE SHIELD | Admitting: Hematology & Oncology

## 2016-05-24 ENCOUNTER — Ambulatory Visit: Payer: BLUE CROSS/BLUE SHIELD

## 2016-06-01 ENCOUNTER — Other Ambulatory Visit (HOSPITAL_BASED_OUTPATIENT_CLINIC_OR_DEPARTMENT_OTHER): Payer: BLUE CROSS/BLUE SHIELD

## 2016-06-01 ENCOUNTER — Ambulatory Visit (HOSPITAL_BASED_OUTPATIENT_CLINIC_OR_DEPARTMENT_OTHER): Payer: BLUE CROSS/BLUE SHIELD | Admitting: Hematology & Oncology

## 2016-06-01 ENCOUNTER — Telehealth: Payer: Self-pay | Admitting: *Deleted

## 2016-06-01 ENCOUNTER — Ambulatory Visit: Payer: BLUE CROSS/BLUE SHIELD

## 2016-06-01 VITALS — BP 141/78 | HR 72 | Resp 16 | Wt 124.0 lb

## 2016-06-01 DIAGNOSIS — N183 Chronic kidney disease, stage 3 unspecified: Secondary | ICD-10-CM

## 2016-06-01 DIAGNOSIS — D509 Iron deficiency anemia, unspecified: Secondary | ICD-10-CM | POA: Diagnosis not present

## 2016-06-01 DIAGNOSIS — D500 Iron deficiency anemia secondary to blood loss (chronic): Secondary | ICD-10-CM

## 2016-06-01 DIAGNOSIS — N189 Chronic kidney disease, unspecified: Secondary | ICD-10-CM

## 2016-06-01 DIAGNOSIS — D631 Anemia in chronic kidney disease: Secondary | ICD-10-CM | POA: Diagnosis not present

## 2016-06-01 DIAGNOSIS — D508 Other iron deficiency anemias: Secondary | ICD-10-CM

## 2016-06-01 DIAGNOSIS — E119 Type 2 diabetes mellitus without complications: Secondary | ICD-10-CM

## 2016-06-01 DIAGNOSIS — K9049 Malabsorption due to intolerance, not elsewhere classified: Secondary | ICD-10-CM

## 2016-06-01 LAB — COMPREHENSIVE METABOLIC PANEL
ALT: 13 U/L (ref 0–55)
AST: 15 U/L (ref 5–34)
Albumin: 3.5 g/dL (ref 3.5–5.0)
Alkaline Phosphatase: 55 U/L (ref 40–150)
Anion Gap: 9 mEq/L (ref 3–11)
BUN: 42.2 mg/dL — ABNORMAL HIGH (ref 7.0–26.0)
CO2: 21 mEq/L — ABNORMAL LOW (ref 22–29)
Calcium: 9.3 mg/dL (ref 8.4–10.4)
Chloride: 109 mEq/L (ref 98–109)
Creatinine: 1.5 mg/dL — ABNORMAL HIGH (ref 0.6–1.1)
EGFR: 38 mL/min/{1.73_m2} — ABNORMAL LOW (ref >90)
Glucose: 258 mg/dl — ABNORMAL HIGH (ref 70–140)
Potassium: 5.6 mEq/L — ABNORMAL HIGH (ref 3.5–5.1)
Sodium: 139 mEq/L (ref 136–145)
Total Bilirubin: 0.25 mg/dL (ref 0.20–1.20)
Total Protein: 6.9 g/dL (ref 6.4–8.3)

## 2016-06-01 LAB — CBC WITH DIFFERENTIAL (CANCER CENTER ONLY)
BASO#: 0 10*3/uL (ref 0.0–0.2)
BASO%: 0.1 % (ref 0.0–2.0)
EOS%: 1.3 % (ref 0.0–7.0)
Eosinophils Absolute: 0.1 10*3/uL (ref 0.0–0.5)
HCT: 36.6 % (ref 34.8–46.6)
HGB: 11.5 g/dL — ABNORMAL LOW (ref 11.6–15.9)
LYMPH#: 1.8 10*3/uL (ref 0.9–3.3)
LYMPH%: 21.5 % (ref 14.0–48.0)
MCH: 29.5 pg (ref 26.0–34.0)
MCHC: 31.4 g/dL — ABNORMAL LOW (ref 32.0–36.0)
MCV: 94 fL (ref 81–101)
MONO#: 0.5 10*3/uL (ref 0.1–0.9)
MONO%: 6.6 % (ref 0.0–13.0)
NEUT#: 5.8 10*3/uL (ref 1.5–6.5)
NEUT%: 70.5 % (ref 39.6–80.0)
Platelets: 220 10*3/uL (ref 145–400)
RBC: 3.9 10*6/uL (ref 3.70–5.32)
RDW: 13.8 % (ref 11.1–15.7)
WBC: 8.2 10*3/uL (ref 3.9–10.0)

## 2016-06-01 LAB — IRON AND TIBC
%SAT: 40 % (ref 21–57)
Iron: 94 ug/dL (ref 41–142)
TIBC: 235 ug/dL — ABNORMAL LOW (ref 236–444)
UIBC: 141 ug/dL (ref 120–384)

## 2016-06-01 LAB — FERRITIN: Ferritin: 411 ng/ml — ABNORMAL HIGH (ref 9–269)

## 2016-06-01 NOTE — Progress Notes (Signed)
Hematology and Oncology Follow Up Visit  Kelly Soto [id number] 12-18-1955 60 y.o. 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 is back for follow-up. She had her gallbladder out the week before Thanksgiving. She did well. This was basically same-day surgery.  She is feeling well. She's had no nausea or vomiting. She's had no fever. Thereafter her blood sugars have been doing okay.  She's had no change in bowel or bladder habits. She has no leg swelling. She has no rashes.  Her iron studies have been doing pretty well. Back in October, her ferritin was 489 with iron saturation of 45%. I think some of the ferritin elevation was secondary to her cholecystitis.  Overall, her performance status is ECOG 1.     Medications:  Current Outpatient Prescriptions:  .  acetaminophen (TYLENOL) 500 MG chewable tablet, Chew 500-1,000 mg by mouth every 6 (six) hours as needed for pain (depends on pain level)., Disp: , Rfl:  .  baclofen (LIORESAL) 10 MG tablet, Take 10 mg by mouth daily as needed. migraines, Disp: , Rfl:  .  betamethasone valerate (VALISONE) 0.1 % cream, Apply 1 application topically 2 (two) times daily as needed (rash)., Disp: , Rfl:  .  clindamycin (CLEOCIN-T) 1 % lotion, Apply topically 2 (two) times daily. (Patient taking differently: Apply topically 2 (two) times daily as needed. ), Disp: 60 mL, Rfl: 0 .  denosumab (PROLIA) 60 MG/ML SOLN injection, Inject 60 mg into the skin every 6 (six) months. Administer in upper arm, thigh, or abdomen  Next injection is due 05-2016, Disp: , Rfl:  .  diazepam (VALIUM) 5 MG tablet, Take 5 mg by mouth daily as needed for anxiety. , Disp: , Rfl: 0 .  Empagliflozin-Linagliptin  (GLYXAMBI) 25-5 MG TABS, Take 1 tablet by mouth daily., Disp: , Rfl:  .  fluticasone (FLONASE) 50 MCG/ACT nasal spray, Place 2 sprays into both nostrils at bedtime., Disp: , Rfl:  .  glyBURIDE-metformin (GLUCOVANCE) 2.5-500 MG per tablet, Take 2 tablets by mouth 2 (two) times daily., Disp: , Rfl:  .  hydroxypropyl methylcellulose / hypromellose (ISOPTO TEARS / GONIOVISC) 2.5 % ophthalmic solution, Place 1 drop into both eyes 3 (three) times daily., Disp: , Rfl:  .  meclizine (ANTIVERT) 25 MG tablet, TAKE 1 TABLET BY MOUTH 3 TIMES A DAY AS NEEDED FOR VERTIGO, Disp: , Rfl: 0 .  metoprolol succinate (TOPROL-XL) 25 MG 24 hr tablet, Take 25 mg by mouth every morning., Disp: , Rfl:  .  omeprazole (PRILOSEC) 40 MG capsule, Take 40 mg by mouth 2 (two) times daily., Disp: , Rfl:  .  promethazine (PHENERGAN) 25 MG tablet, Take 25-50 mg by mouth every 8 (eight) hours as needed (for migraine induced nausea)., Disp: , Rfl:  .  topiramate (TOPAMAX) 25 MG tablet, Take 75 mg by mouth daily. , Disp: , Rfl:  .  triamcinolone cream (KENALOG) 0.1 %, Apply 1 application topically 2 (two) times daily as needed (skin irritation)., Disp: , Rfl:   Allergies:  Allergies  Allergen Reactions  . Aspirin Other (See Comments)    Cannot take due to gastric bypass surgery  . Nsaids Anaphylaxis  . Ace Inhibitors Other (See Comments)  Angioedema  . Boniva [Ibandronic Acid] Other (See Comments)    Joint pain  . Sular [Nisoldipine Er] Other (See Comments)    angioedema    Past Medical History, Surgical history, Social history, and Family History were reviewed and updated.  Review of Systems: As above  Physical Exam:  weight is 124 lb (56.2 kg). Her blood pressure is 141/78 (abnormal) and her pulse is 72. Her respiration is 16.   Well-developed and well-nourished white female in no obvious distress. Head and neck exam shows no ocular or oral lesions. She is no palpable cervical or supraclavicular lymph nodes. Lungs are  clear with no wheezes or rhonchi. Cardiac exam regular rate and rhythm with no murmurs rubs or bruits. Abdomen is soft. She is good bowel sounds. There is no fluid wave. There is no palpable liver or spleen tip. Back exam shows no tenderness over the spine ribs or hips. Extremities shows no clubbing cyanosis or edema. Skin exam shows a macular type rash on her forearms.. Neurological exam is nonfocal.  Lab Results  Component Value Date   WBC 8.2 06/01/2016   HGB 11.5 (L) 06/01/2016   HCT 36.6 06/01/2016   MCV 94 06/01/2016   PLT 220 06/01/2016     Chemistry      Component Value Date/Time   NA 137 05/11/2016 0703   NA 139 04/12/2016 1052   K 5.1 05/11/2016 0703   K 5.8 (H) 04/12/2016 1052   CL 109 05/11/2016 0703   CL 103 03/02/2015 0926   CO2 23 05/11/2016 0703   CO2 21 (L) 04/12/2016 1052   BUN 41 (H) 05/11/2016 0703   BUN 39.2 (H) 04/12/2016 1052   CREATININE 1.46 (H) 05/11/2016 0703   CREATININE 1.4 (H) 04/12/2016 1052      Component Value Date/Time   CALCIUM 9.5 05/11/2016 0703   CALCIUM 9.7 04/12/2016 1052   ALKPHOS 56 04/12/2016 1052   AST 18 04/12/2016 1052   ALT 17 04/12/2016 1052   BILITOT 0.22 04/12/2016 1052         Impression and Plan: Kelly Soto is 60 year old white female. She has diabetes. She has iron deficiency anemia. She has a very low erythropoietin level.  I am Glad that she had her gallbladder taken out. This really has made her life better.  We will go ahead and get her back now in 2 months. I think this would be reasonable.    Volanda Napoleon, MD 12/7/20178:22 AM

## 2016-06-01 NOTE — Telephone Encounter (Addendum)
Patient aware of results  ----- Message from Volanda Napoleon, MD sent at 06/01/2016 11:15 AM EST ----- Call - iron level is ok!! pete

## 2016-06-02 LAB — RETICULOCYTES: Reticulocyte Count: 0.5 % — ABNORMAL LOW (ref 0.6–2.6)

## 2016-06-29 ENCOUNTER — Emergency Department (HOSPITAL_COMMUNITY)
Admission: EM | Admit: 2016-06-29 | Discharge: 2016-06-29 | Disposition: A | Discharge status: Home / Self Care | Payer: BLUE CROSS/BLUE SHIELD | Attending: Emergency Medicine | Admitting: Emergency Medicine

## 2016-06-29 ENCOUNTER — Encounter (HOSPITAL_COMMUNITY): Payer: Self-pay | Admitting: Emergency Medicine

## 2016-06-29 ENCOUNTER — Emergency Department (HOSPITAL_COMMUNITY): Payer: BLUE CROSS/BLUE SHIELD

## 2016-06-29 DIAGNOSIS — E119 Type 2 diabetes mellitus without complications: Secondary | ICD-10-CM | POA: Insufficient documentation

## 2016-06-29 DIAGNOSIS — I10 Essential (primary) hypertension: Secondary | ICD-10-CM | POA: Diagnosis not present

## 2016-06-29 DIAGNOSIS — I712 Thoracic aortic aneurysm, without rupture: Secondary | ICD-10-CM | POA: Insufficient documentation

## 2016-06-29 DIAGNOSIS — I7121 Aneurysm of the ascending aorta, without rupture: Secondary | ICD-10-CM

## 2016-06-29 DIAGNOSIS — R072 Precordial pain: Secondary | ICD-10-CM | POA: Diagnosis present

## 2016-06-29 DIAGNOSIS — Z79899 Other long term (current) drug therapy: Secondary | ICD-10-CM | POA: Diagnosis not present

## 2016-06-29 LAB — BASIC METABOLIC PANEL
Anion gap: 8 (ref 5–15)
BUN: 29 mg/dL — ABNORMAL HIGH (ref 6–20)
CO2: 20 mmol/L — ABNORMAL LOW (ref 22–32)
Calcium: 9.1 mg/dL (ref 8.9–10.3)
Chloride: 108 mmol/L (ref 101–111)
Creatinine, Ser: 1.39 mg/dL — ABNORMAL HIGH (ref 0.44–1.00)
GFR calc Af Amer: 46 mL/min — ABNORMAL LOW (ref >60)
GFR calc non Af Amer: 40 mL/min — ABNORMAL LOW (ref >60)
Glucose, Bld: 165 mg/dL — ABNORMAL HIGH (ref 65–99)
Potassium: 4.7 mmol/L (ref 3.5–5.1)
Sodium: 136 mmol/L (ref 135–145)

## 2016-06-29 LAB — CBC
HCT: 32.3 % — ABNORMAL LOW (ref 36.0–46.0)
Hemoglobin: 10.2 g/dL — ABNORMAL LOW (ref 12.0–15.0)
MCH: 28.9 pg (ref 26.0–34.0)
MCHC: 31.6 g/dL (ref 30.0–36.0)
MCV: 91.5 fL (ref 78.0–100.0)
Platelets: 205 10*3/uL (ref 150–400)
RBC: 3.53 MIL/uL — ABNORMAL LOW (ref 3.87–5.11)
RDW: 13.4 % (ref 11.5–15.5)
WBC: 7.7 10*3/uL (ref 4.0–10.5)

## 2016-06-29 LAB — I-STAT TROPONIN, ED
Troponin i, poc: 0 ng/mL (ref 0.00–0.08)
Troponin i, poc: 0 ng/mL (ref 0.00–0.08)

## 2016-06-29 MED ORDER — IOPAMIDOL (ISOVUE-370) INJECTION 76%
INTRAVENOUS | Status: AC
  Administered 2016-06-29: 80 mL
  Filled 2016-06-29: qty 100

## 2016-06-29 NOTE — ED Provider Notes (Signed)
[facility] DEPT Provider Note   CSN: [id number] Arrival date & time: 06/29/16  0855     History   Chief Complaint Chief Complaint  Patient presents with  . Chest Pain    HPI Kelly Soto is a 61 y.o. female.  HPI   61 year old female with history of diabetes, chronic kidney disease, gastric ulcer brought here via EMS from home for evaluation of chest pain. Patient reports she was awoke at 2 AM this morning with intense sharp stabbing pain to her substernal region radiates towards her back that was episodic. She reports associated diaphoresis, shortness of breath, and nausea. Pain is waxing waning until she was brought here via EMS and received a subungual nitroglycerin that have abated her pain. She is currently pain-free. She has never expressing this, symptoms before. No associated fever, headache, dizziness, pleuritic chest pain, productive cough, hemoptysis, hematochezia, melena, dysuria, or rash. Denies eating any abnormal food. She is not a smoker or drinker. No significant cardiac history and no recent cardiac provocative testing. She has had a cholecystectomy last November and states that her cardiac exam at that time was unremarkable. Remote history of Roux-en-Y gastric bypass for weight reduction. No history of AAA. Does have history of hiatal hernia, history of stomach ulcer, taking Prilosec regularly. States her pain felt different from GERD. No numbness or weakness to her extremities.  Past Medical History:  Diagnosis Date  . Anemia of renal disease 09/24/2013  . Chronic renal insufficiency 09/24/2013  . Diabetes mellitus without complication ([place])   . Headache    migrains  . History of hiatal hernia   . Hypertension   . PONV (postoperative nausea and vomiting)   . Ulcer [facility])     Patient Active Problem List   Diagnosis Date Noted  . Chronic renal insufficiency 09/24/2013  . Anemia of renal disease 09/24/2013  . H/O gastric bypass 08/22/2013  . Intestinal  malabsorption 08/22/2013  . Anemia, iron deficiency 08/22/2013  . Chronic eustachian tube dysfunction 03/28/2011  . Diabetes mellitus ([place]) 03/28/2011    Past Surgical History:  Procedure Laterality Date  . ABDOMINAL HYSTERECTOMY  1992  . CARPAL TUNNEL RELEASE Bilateral   . CHOLECYSTECTOMY N/A 05/11/2016   Procedure: LAPAROSCOPIC CHOLECYSTECTOMY;  Surgeon: Erroll Luna, MD;  Location: [place];  Service: General;  Laterality: N/A;  . GASTRIC BYPASS  2009    OB History    No data available       Home Medications    Prior to Admission medications   Medication Sig Start Date End Date Taking? Authorizing Provider  acetaminophen (TYLENOL) 500 MG chewable tablet Chew 500-1,000 mg by mouth every 6 (six) hours as needed for pain (depends on pain level).    Historical Provider, MD  baclofen (LIORESAL) 10 MG tablet Take 10 mg by mouth daily as needed. migraines 11/10/13   Historical Provider, MD  betamethasone valerate (VALISONE) 0.1 % cream Apply 1 application topically 2 (two) times daily as needed (rash).    Historical Provider, MD  clindamycin (CLEOCIN-T) 1 % lotion Apply topically 2 (two) times daily. Patient taking differently: Apply topically 2 (two) times daily as needed.  01/12/15   Volanda Napoleon, MD  denosumab (PROLIA) 60 MG/ML SOLN injection Inject 60 mg into the skin every 6 (six) months. Administer in upper arm, thigh, or abdomen  Next injection is due 05-2016    Historical Provider, MD  diazepam (VALIUM) 5 MG tablet Take 5 mg by mouth daily as needed for anxiety.  01/20/15   Historical Provider, MD  Empagliflozin-Linagliptin (GLYXAMBI) 25-5 MG TABS Take 1 tablet by mouth daily.    Historical Provider, MD  fluticasone (FLONASE) 50 MCG/ACT nasal spray Place 2 sprays into both nostrils at bedtime.    Historical Provider, MD  glyBURIDE-metformin (GLUCOVANCE) 2.5-500 MG per tablet Take 2 tablets by mouth 2 (two) times daily. 02/06/13   Historical Provider, MD  hydroxypropyl  methylcellulose / hypromellose (ISOPTO TEARS / GONIOVISC) 2.5 % ophthalmic solution Place 1 drop into both eyes 3 (three) times daily.    Historical Provider, MD  meclizine (ANTIVERT) 25 MG tablet TAKE 1 TABLET BY MOUTH 3 TIMES A DAY AS NEEDED FOR VERTIGO 01/20/15   Historical Provider, MD  metoprolol succinate (TOPROL-XL) 25 MG 24 hr tablet Take 25 mg by mouth every morning. 05/31/16   Historical Provider, MD  omeprazole (PRILOSEC) 40 MG capsule Take 40 mg by mouth 2 (two) times daily.    Historical Provider, MD  promethazine (PHENERGAN) 25 MG tablet Take 25-50 mg by mouth every 8 (eight) hours as needed (for migraine induced nausea).    Historical Provider, MD  topiramate (TOPAMAX) 25 MG tablet Take 75 mg by mouth daily.  02/13/13   Historical Provider, MD  triamcinolone cream (KENALOG) 0.1 % Apply 1 application topically 2 (two) times daily as needed (skin irritation).    Historical Provider, MD    Family History No family history on file.  Social History Social History  Substance Use Topics  . Smoking status: Never Smoker  . Smokeless tobacco: Never Used     Comment: never used tobacco  . Alcohol use No     Allergies   Aspirin; Nsaids; Ace inhibitors; Boniva [ibandronic acid]; and Sular [nisoldipine er]   Review of Systems Review of Systems  All other systems reviewed and are negative.    Physical Exam Updated Vital Signs BP 155/76 (BP Location: Right Arm)   Pulse 65   Temp 97.7 F (36.5 C) (Oral)   Resp 12   SpO2 100%   Physical Exam  Constitutional: She appears well-developed and well-nourished. No distress.  HENT:  Head: Atraumatic.  Eyes: Conjunctivae are normal.  Neck: Neck supple.  Cardiovascular: Normal rate, regular rhythm and intact distal pulses.   Pulmonary/Chest: Effort normal and breath sounds normal. No respiratory distress. She has no wheezes. She exhibits no tenderness.  Abdominal: Soft. She exhibits no distension. There is no tenderness.  No  abdominal bruit or pulsatile mass  Musculoskeletal: She exhibits no edema.  Neurological: She is alert.  Skin: No rash noted.  Psychiatric: She has a normal mood and affect.  Nursing note and vitals reviewed.    ED Treatments / Results  Labs (all labs ordered are listed, but only abnormal results are displayed) Labs Reviewed  BASIC METABOLIC PANEL - Abnormal; Notable for the following:       Result Value   CO2 20 (*)    Glucose, Bld 165 (*)    BUN 29 (*)    Creatinine, Ser 1.39 (*)    GFR calc non Af Amer 40 (*)    GFR calc Af Amer 46 (*)    All other components within normal limits  CBC - Abnormal; Notable for the following:    RBC 3.53 (*)    Hemoglobin 10.2 (*)    HCT 32.3 (*)    All other components within normal limits  I-STAT TROPOININ, ED  I-STAT TROPOININ, ED    EKG  EKG Interpretation  Date/Time:  Thursday June 29 2016 09:04:50 EST Ventricular Rate:  63 PR Interval:    QRS Duration: 71 QT Interval:  388 QTC Calculation: 398 R Axis:   89 Text Interpretation:  Sinus rhythm Borderline right axis deviation No significant change since last tracing Confirmed by [facility] ([id number]) on 06/29/2016 9:17:16 AM       Radiology Dg Chest 2 View  Result Date: 06/29/2016 CLINICAL DATA:  Chest pain . EXAM: CHEST  2 VIEW COMPARISON:  No recent. FINDINGS: Mediastinum hilar structures normal. Lungs are clear. Heart size normal. No pleural effusion or pneumothorax. Surgical clips right upper quadrant. IMPRESSION: No acute cardiopulmonary disease. Electronically Signed   By: Marcello Moores  Register   On: 06/29/2016 09:48   Ct Angio Chest/abd/pel For Dissection W And/or Wo Contrast  Result Date: 06/29/2016 CLINICAL DATA:  61 year old female with mid chest pain beginning at 2 a.m. accompanied by diaphoresis EXAM: CT ANGIOGRAPHY CHEST WITH CONTRAST TECHNIQUE: Multidetector CT imaging of the chest was performed using the standard protocol during bolus administration of intravenous  contrast. Multiplanar CT image reconstructions and MIPs were obtained to evaluate the vascular anatomy. CONTRAST:  80 mL Isovue 370 COMPARISON:  None. FINDINGS: Cardiovascular: Mildly aneurysmal ascending thoracic aorta measuring up to 3 point 7 cm in transverse diameter. This appears at least 1.5 times larger than then normal size of the vessel given patient body size. The aortic root is normal in caliber. There is no effacement of the sino-tubular junction. No evidence of dissection or aortic irregularity. Two vessel aortic arch. The right brachiocephalic and left common carotid artery share a common origin. The transverse aorta and descending thoracic aorta are normal in caliber. Main pulmonary artery is patent and well opacified. Normal caliber. No focal filling defects to suggest an acute pulmonary embolus. Borderline cardiomegaly and left ventricular dilatation. No pericardial effusion. Mediastinum/Nodes: Unremarkable CT appearance of the thyroid gland. No suspicious mediastinal or hilar adenopathy. No soft tissue mediastinal mass. Small hiatal hernia. Lungs/Pleura: 3 mm nonspecific pulmonary nodule in the right lung apex (image 24 series 5). The 2 mm subpleural nodule along the right major fissure (image 78 series 5) ; 4 mm right lower lobe pulmonary nodule (image 80 series 5). There are several additional 3-4 mm pulmonary nodules bilaterally. The lungs are otherwise clear. No pneumothorax, focal airspace consolidation or pleural effusion. Musculoskeletal: No acute fracture or aggressive appearing lytic or blastic osseous lesion. Upper abdomen: Surgical clips of prior cholecystectomy and gastric bypass surgery. Low-attenuation 2.6 cm left adrenal nodule likely represents a benign adenoma. No additional acute findings are identified within the abdomen. IMPRESSION: 1. Negative for acute PE or acute aortic dissection. 2. Borderline aneurysmal dilatation of the ascending thoracic aorta at 3.7 cm. Consider annual  imaging followup by CTA or MRA. This recommendation follows 2010 ACCF/AHA/AATS/ACR/ASA/SCA/SCAI/SIR/STS/SVM Guidelines for the Diagnosis and Management of Patients with Thoracic Aortic Disease. Circulation. 2010; 121: W431-V400. 3. Cardiomegaly with mild left ventricular dilatation. 4. Incompletely imaged probable 2.6 cm left adrenal adenoma. 5. Multiple bilateral pulmonary nodules ranging in size from 2 to 4 mm. No follow-up needed if patient is low-risk (and has no known or suspected primary neoplasm). Non-contrast chest CT can be considered in 12 months if patient is high-risk. This recommendation follows the consensus statement: Guidelines for Management of Incidental Pulmonary Nodules Detected on CT Images: From the Fleischner Society 2017; Radiology 2017; 284:228-243. These results were called by telephone at the time of interpretation on 06/29/2016 at 11:13 am to Dr. Domenic Moras , who  verbally acknowledged these results. Electronically Signed   By: Jacqulynn Cadet M.D.   On: 06/29/2016 11:15    Procedures Procedures (including critical care time)  Medications Ordered in ED Medications  iopamidol (ISOVUE-370) 76 % injection (80 mLs  Contrast Given 06/29/16 1016)     Initial Impression / Assessment and Plan / ED Course  I have reviewed the triage vital signs and the nursing notes.  Pertinent labs & imaging results that were available during my care of the patient were reviewed by me and considered in my medical decision making (see chart for details).  Clinical Course     BP 128/80   Pulse 66   Temp 97.7 F (36.5 C) (Oral)   Resp 12   SpO2 98%    Final Clinical Impressions(s) / ED Diagnoses   Final diagnoses:  Substernal chest pain  Ascending aortic aneurysm (HCC)    New Prescriptions New Prescriptions   No medications on file   9:31 AM Patient here with substernal chest pain and epigastric abdominal pain that radiates to her back. Does have history of gastric ulcer which  may contribute to her pain. However, she will also need to be evaluated for potential dissection although my suspicion is low. Doubt PE. Workup initiated. Care discussed with Dr. Leonette Monarch.  11:07 AM Patient is currently states asymptomatic. She is resting comfortably. She is afebrile with stable normal vital sign. Her initial EKG without acute ischemic changes, normal troponin, HEART score is 3 which puts her in the low risk category of MACE.  Evidence of renal insufficiency with a creatinine of 1.39 which is improved from baseline. She has normal white count. A chest x-ray is normal.  I initially ordered a CT angiogram of the chest abdomen and pelvis to rule out dissection. Our radiologist called to notify that the test was done as a PE study which include the chest only. He did see mild aortic aneurysm noted at 3.75 which would likely benefit from outpatient follow-up by a cardiothoracic surgeon. However, no evidence of PE and no obvious indication to suggest a dissection. Since patient does not have any significant abdominal discomfort and appears stable and without pain, I do not think additional imaging is indicated at this time.  11:31 AM Suspect pain likely 2/2 gastric ulcer disease since pt was awoke with pain.  She is doing well currently, in NAD.   12:39 PM Normal delta trop.  Pt remains sxs free.  Stable for discharge with close f/u with PCP.  Return precaution discussed.     Domenic Moras, PA-C 06/29/16 [place], MD 06/29/16 1710

## 2016-06-29 NOTE — Discharge Instructions (Signed)
You have been evaluated for your chest pain.  No concerning finding on today's exam.  You do have a 3.7cm enlarge ascending aorta aneurysm that will need annual monitoring.  Please follow up with your primary care provider for further management.  Return to the ER if your condition worsen or if you have other concerns.

## 2016-06-29 NOTE — ED Triage Notes (Signed)
Per [facility]: Pt woke up at 2 am from sharp pain in the chest that radiated to back, pain was 8/10, woke up again at 7 with the same pain then called EMS. Pt complaining of nausea with no vomiting.  Pt was given 1 nitro with EMS, this helped with the pain. Pt did not ASA due to hx of gastric bypass.

## 2016-06-29 NOTE — ED Notes (Signed)
Patient ambulated to the restroom

## 2016-06-30 ENCOUNTER — Other Ambulatory Visit: Payer: Self-pay | Admitting: Family Medicine

## 2016-06-30 DIAGNOSIS — R1013 Epigastric pain: Secondary | ICD-10-CM

## 2016-06-30 DIAGNOSIS — R634 Abnormal weight loss: Secondary | ICD-10-CM

## 2016-07-03 ENCOUNTER — Ambulatory Visit: Payer: BLUE CROSS/BLUE SHIELD | Admitting: Cardiovascular Disease

## 2016-07-05 ENCOUNTER — Encounter: Payer: Self-pay | Admitting: Cardiovascular Disease

## 2016-07-05 ENCOUNTER — Ambulatory Visit (INDEPENDENT_AMBULATORY_CARE_PROVIDER_SITE_OTHER): Payer: BLUE CROSS/BLUE SHIELD | Admitting: Cardiovascular Disease

## 2016-07-05 VITALS — BP 151/73 | HR 69 | Ht 61.0 in | Wt 123.6 lb

## 2016-07-05 DIAGNOSIS — E119 Type 2 diabetes mellitus without complications: Secondary | ICD-10-CM | POA: Diagnosis not present

## 2016-07-05 DIAGNOSIS — I77810 Thoracic aortic ectasia: Secondary | ICD-10-CM

## 2016-07-05 DIAGNOSIS — R1013 Epigastric pain: Secondary | ICD-10-CM

## 2016-07-05 DIAGNOSIS — I517 Cardiomegaly: Secondary | ICD-10-CM

## 2016-07-05 DIAGNOSIS — I10 Essential (primary) hypertension: Secondary | ICD-10-CM | POA: Diagnosis not present

## 2016-07-05 NOTE — Progress Notes (Signed)
Cardiology Office Note    Date:  07/05/2016   ID:  Kelly Soto, DOB [birth year], MRN [id number]  PCP:  [name], MD  Cardiologist:   Sanda Klein, MD   Chief Complaint  Patient presents with  . New Evaluation    pt c/o upper abdominal pain , en larged heart     History of Present Illness:  Kelly Soto is a 61 y.o. female without known previous cardiac or vascular disease referred for evaluation after a chest CT angiogram showed evidence of a mildly dilated ascending aorta (3.7 cm).  She went to the emergency room for severe epigastric/low retrosternal pain. This initially occurred in the evening and was not precipitated by physical activity or a meal. 4 4-5 minutes she had severe unremitting sharp pain and thought "she was going to die". She had diaphoresis, but did not have dyspnea, nausea or vomiting. The symptoms then resolve spontaneously, although she had small brief sharp stabbing sensations in the epigastrium for another several hours. The following morning she had recurrence of the very severe unremitting pain and went to the emergency room. Workup included a CT angiogram of the chest abdomen and pelvis that did not show evidence of dissection or aneurysm but showed the borderline enlargement of the ascending thoracic aorta, as well as multiple tiny sub-half centimeter nodules in both lungs. Oral contrast was not administered. Also important to note there was no evidence of coronary calcification or any other sign of atherosclerosis on her extensive imaging study.  Study described borderline cardiomegaly and left ventricular dilatation. On my review I do not find these abnormalities to be very impressive.  Electrocardiography was normal. Cardiac enzymes were also normal.  She denies exertional angina or dyspnea. She does not have intermittent claudication, leg edema, palpitations, syncope or focal neurological complaints.  She does have a history of gastric ulcer  demonstrated by endoscopy many years ago (Dr. Paulita Fujita). Last November she underwent cholecystectomy for gallstones and nausea (Dr. Brantley Stage). The nausea has resolved. She has a history of Roux-en-Y gastric bypass performed as a bariatric procedure roughly 10 years ago and has lost substantial weight since that time. Weight loss has stabilized since then, although she has had some absorption problems.   She has an excellent lipid profile (cholesterol 173, HDL 69, LDL 81), but continues to have type 2 diabetes mellitus despite the weight loss. In May she had a good A1c at 6.9% but the more recent hemoglobin A1c performed in October was unsatisfactory at 8.7%. She has well treated systemic hypertension for which she takes a combination of beta blocker/angiotensin receptor blocker/thiazide diuretic.  She is a nonsmoker. She does not know much about her father's medical problems, but he did die suddenly in his 23s.    Past Medical History:  Diagnosis Date  . Anemia of renal disease 09/24/2013  . Chronic renal insufficiency 09/24/2013  . Diabetes mellitus without complication ([place])   . Headache    migrains  . History of hiatal hernia   . Hypertension   . PONV (postoperative nausea and vomiting)   . Ulcer [facility])     Past Surgical History:  Procedure Laterality Date  . ABDOMINAL HYSTERECTOMY  1992  . CARPAL TUNNEL RELEASE Bilateral   . CHOLECYSTECTOMY N/A 05/11/2016   Procedure: LAPAROSCOPIC CHOLECYSTECTOMY;  Surgeon: Erroll Luna, MD;  Location: [place];  Service: General;  Laterality: N/A;  . GASTRIC BYPASS  2009    Current Medications: Outpatient Medications Prior to Visit  Medication Sig  Dispense Refill  . acetaminophen (TYLENOL) 500 MG chewable tablet Chew 500-1,000 mg by mouth every 6 (six) hours as needed for pain (depends on pain level).    . baclofen (LIORESAL) 10 MG tablet Take 10 mg by mouth daily as needed. migraines    . betamethasone valerate (VALISONE) 0.1 % cream Apply 1  application topically 2 (two) times daily as needed (rash).    . clindamycin (CLEOCIN-T) 1 % lotion Apply topically 2 (two) times daily. (Patient taking differently: Apply topically 2 (two) times daily as needed. ) 60 mL 0  . denosumab (PROLIA) 60 MG/ML SOLN injection Inject 60 mg into the skin every 6 (six) months. Administer in upper arm, thigh, or abdomen  Next injection is due 05-2016    . diazepam (VALIUM) 5 MG tablet Take 5 mg by mouth daily as needed for anxiety.   0  . Empagliflozin-Linagliptin (GLYXAMBI) 25-5 MG TABS Take 1 tablet by mouth daily.    . fluticasone (FLONASE) 50 MCG/ACT nasal spray Place 2 sprays into both nostrils at bedtime.    [name] glyBURIDE-metformin (GLUCOVANCE) 2.5-500 MG per tablet Take 2 tablets by mouth 2 (two) times daily.    . hydroxypropyl methylcellulose / hypromellose (ISOPTO TEARS / GONIOVISC) 2.5 % ophthalmic solution Place 1 drop into both eyes 3 (three) times daily.    . meclizine (ANTIVERT) 25 MG tablet TAKE 1 TABLET BY MOUTH 3 TIMES A DAY AS NEEDED FOR VERTIGO  0  . metoprolol succinate (TOPROL-XL) 25 MG 24 hr tablet Take 25 mg by mouth every morning.    [name] omeprazole (PRILOSEC) 40 MG capsule Take 40 mg by mouth 2 (two) times daily.    . promethazine (PHENERGAN) 25 MG tablet Take 25-50 mg by mouth every 8 (eight) hours as needed (for migraine induced nausea).    . topiramate (TOPAMAX) 25 MG tablet Take 75 mg by mouth daily.     [name] triamcinolone cream (KENALOG) 0.1 % Apply 1 application topically 2 (two) times daily as needed (skin irritation).     No facility-administered medications prior to visit.      Allergies:   Aspirin; Nsaids; Ace inhibitors; Sular [nisoldipine er]; and Boniva [ibandronic acid]   Social History   Social History  . Marital status: Married    Spouse name: N/A  . Number of children: N/A  . Years of education: N/A   Social History Main Topics  . Smoking status: Never Smoker  . Smokeless tobacco: Never Used     Comment: never  used tobacco  . Alcohol use No  . Drug use: No  . Sexual activity: Yes   Other Topics Concern  . None   Social History Narrative  . None     Family History:  The patient's family history includes Atrial fibrillation in her mother; Heart attack in her father; Stroke in her maternal grandmother; Sudden Cardiac Death in her father.   ROS:   Please see the history of present illness.    ROS All other systems reviewed and are negative.   PHYSICAL EXAM:   VS:  BP (!) 151/73 (BP Location: Left Arm, Patient Position: Sitting, Cuff Size: Normal)   Pulse 69   Ht 5\' 1"  (1.549 m)   Wt 123 lb 9.6 oz (56.1 kg)   BMI 23.35 kg/m    GEN: Well nourished, well developed, in no acute distress  HEENT: normal  Neck: no JVD, carotid bruits, or masses Cardiac: RRR; no murmurs, rubs, or gallops,no edema  Respiratory:  clear to auscultation bilaterally, normal work of breathing GI: soft, nontender, nondistended, + BS MS: no deformity or atrophy  Skin: warm and dry, no rash Neuro:  Alert and Oriented x 3, Strength and sensation are intact Psych: euthymic mood, full affect  Wt Readings from Last 3 Encounters:  07/05/16 123 lb 9.6 oz (56.1 kg)  06/01/16 124 lb (56.2 kg)  05/11/16 124 lb (56.2 kg)      Studies/Labs Reviewed:   EKG:  EKG is not ordered today.  The ekg ordered in the ED demonstrates NSR, normal tracing  Recent Labs: 06/01/2016: ALT 13 06/29/2016: BUN 29; Creatinine, Ser 1.39; Hemoglobin 10.2; Platelets 205; Potassium 4.7; Sodium 136   Lipid Panel cholesterol 173, HDL 69, LDL 81  Additional studies/ records that were reviewed today include:  ED records, notes from Dr. Brigitte Pulse    ASSESSMENT:    1. Mild dilation of ascending aorta ([place])   2. Essential hypertension   3. Type 2 diabetes mellitus without complication, without long-term current use of insulin (HCC)   4. Cardiomegaly   5. Epigastric pain      PLAN:  In order of problems listed above:  1. Dilated ascending  aorta: Her ascending aorta diameter is only borderline abnormal and unlikely to be responsible for symptoms or progress to a serious disorder in the near future. I would recommend a repeat CT angiogram of the thoracic aorta and 12 months. If this shows no significant progression, would then probably evaluate once every 3-5 years only. The repeat CT of the chest in 12 months will also allow repeat evaluation of the small pulmonary nodules 2. HTN: Elevated blood pressure today is usually much better treated. Incidentally on both his beta blocker and angiotensin receptor blocker which are best choice in the setting of a dilated aorta. No changes were made to her medications. 3. DM: Earlier this year had excellent glycemic control, recently deteriorated. 4. Possible cardiomegaly: On my review, the CT is not particularly impressive for this finding. Will order an echocardiogram to confirm. 5. Epigastric pain: Consider blind loop syndrome, recurrent gastric ulcer, etc. almost certainly has GI etiology. Strongly doubt cardiovascular cause. Consider repeat evaluation with EGD by Dr. Paulita Fujita.    Medication Adjustments/Labs and Tests Ordered: Current medicines are reviewed at length with the patient today.  Concerns regarding medicines are outlined above.  Medication changes, Labs and Tests ordered today are listed in the Patient Instructions below. Patient Instructions  Dr Sallyanne Kuster recommends that you follow-up with him as needed.    Signed, Sanda Klein, MD  07/05/2016 4:28 PM    [address], [place], [place]  [address] Phone: [phone]; Fax: [phone]

## 2016-07-05 NOTE — Patient Instructions (Signed)
Dr Croitoru recommends that you follow-up with him as needed. 

## 2016-07-10 ENCOUNTER — Ambulatory Visit
Admission: RE | Admit: 2016-07-10 | Discharge: 2016-07-10 | Disposition: A | Discharge status: Home / Self Care | Payer: BLUE CROSS/BLUE SHIELD | Source: Ambulatory Visit | Attending: Family Medicine | Admitting: Family Medicine

## 2016-07-10 DIAGNOSIS — R1013 Epigastric pain: Secondary | ICD-10-CM

## 2016-07-10 DIAGNOSIS — R634 Abnormal weight loss: Secondary | ICD-10-CM

## 2016-07-10 MED ORDER — IOPAMIDOL (ISOVUE-300) INJECTION 61%
100.0000 mL | Freq: Once | INTRAVENOUS | Status: AC | PRN
  Administered 2016-07-10: 100 mL via INTRAVENOUS

## 2016-07-31 ENCOUNTER — Other Ambulatory Visit: Payer: Self-pay | Admitting: Gastroenterology

## 2016-07-31 DIAGNOSIS — R1084 Generalized abdominal pain: Secondary | ICD-10-CM

## 2016-08-02 ENCOUNTER — Ambulatory Visit (HOSPITAL_BASED_OUTPATIENT_CLINIC_OR_DEPARTMENT_OTHER): Payer: BLUE CROSS/BLUE SHIELD | Admitting: Family

## 2016-08-02 ENCOUNTER — Telehealth: Payer: Self-pay | Admitting: *Deleted

## 2016-08-02 ENCOUNTER — Other Ambulatory Visit (HOSPITAL_BASED_OUTPATIENT_CLINIC_OR_DEPARTMENT_OTHER): Payer: BLUE CROSS/BLUE SHIELD

## 2016-08-02 ENCOUNTER — Ambulatory Visit (HOSPITAL_BASED_OUTPATIENT_CLINIC_OR_DEPARTMENT_OTHER): Payer: BLUE CROSS/BLUE SHIELD

## 2016-08-02 VITALS — BP 135/68 | HR 83 | Temp 98.5°F | Resp 20 | Wt 121.0 lb

## 2016-08-02 DIAGNOSIS — N183 Chronic kidney disease, stage 3 unspecified: Secondary | ICD-10-CM

## 2016-08-02 DIAGNOSIS — N189 Chronic kidney disease, unspecified: Secondary | ICD-10-CM | POA: Diagnosis not present

## 2016-08-02 DIAGNOSIS — D509 Iron deficiency anemia, unspecified: Secondary | ICD-10-CM | POA: Diagnosis not present

## 2016-08-02 DIAGNOSIS — K909 Intestinal malabsorption, unspecified: Secondary | ICD-10-CM

## 2016-08-02 DIAGNOSIS — D631 Anemia in chronic kidney disease: Secondary | ICD-10-CM

## 2016-08-02 DIAGNOSIS — D500 Iron deficiency anemia secondary to blood loss (chronic): Secondary | ICD-10-CM

## 2016-08-02 DIAGNOSIS — D508 Other iron deficiency anemias: Secondary | ICD-10-CM

## 2016-08-02 DIAGNOSIS — K9049 Malabsorption due to intolerance, not elsewhere classified: Secondary | ICD-10-CM

## 2016-08-02 DIAGNOSIS — Z9884 Bariatric surgery status: Secondary | ICD-10-CM

## 2016-08-02 LAB — CMP (CANCER CENTER ONLY)
ALT(SGPT): 25 U/L (ref 10–47)
AST: 22 U/L (ref 11–38)
Albumin: 3.4 g/dL (ref 3.3–5.5)
Alkaline Phosphatase: 47 U/L (ref 26–84)
BUN, Bld: 33 mg/dL — ABNORMAL HIGH (ref 7–22)
CO2: 22 mEq/L (ref 18–33)
Calcium: 9.3 mg/dL (ref 8.0–10.3)
Chloride: 108 mEq/L (ref 98–108)
Creat: 1.3 mg/dl — ABNORMAL HIGH (ref 0.6–1.2)
Glucose, Bld: 214 mg/dL — ABNORMAL HIGH (ref 73–118)
Potassium: 4.8 mEq/L — ABNORMAL HIGH (ref 3.3–4.7)
Sodium: 143 mEq/L (ref 128–145)
Total Bilirubin: 0.4 mg/dl (ref 0.20–1.60)
Total Protein: 6.6 g/dL (ref 6.4–8.1)

## 2016-08-02 LAB — CBC WITH DIFFERENTIAL (CANCER CENTER ONLY)
BASO#: 0 10*3/uL (ref 0.0–0.2)
BASO%: 0.2 % (ref 0.0–2.0)
EOS%: 1 % (ref 0.0–7.0)
Eosinophils Absolute: 0.1 10*3/uL (ref 0.0–0.5)
HCT: 34.4 % — ABNORMAL LOW (ref 34.8–46.6)
HGB: 10.7 g/dL — ABNORMAL LOW (ref 11.6–15.9)
LYMPH#: 1.7 10*3/uL (ref 0.9–3.3)
LYMPH%: 16 % (ref 14.0–48.0)
MCH: 29.2 pg (ref 26.0–34.0)
MCHC: 31.1 g/dL — ABNORMAL LOW (ref 32.0–36.0)
MCV: 94 fL (ref 81–101)
MONO#: 0.7 10*3/uL (ref 0.1–0.9)
MONO%: 7.1 % (ref 0.0–13.0)
NEUT#: 7.9 10*3/uL — ABNORMAL HIGH (ref 1.5–6.5)
NEUT%: 75.7 % (ref 39.6–80.0)
Platelets: 217 10*3/uL (ref 145–400)
RBC: 3.66 10*6/uL — ABNORMAL LOW (ref 3.70–5.32)
RDW: 13.6 % (ref 11.1–15.7)
WBC: 10.5 10*3/uL — ABNORMAL HIGH (ref 3.9–10.0)

## 2016-08-02 LAB — IRON AND TIBC
%SAT: 32 % (ref 21–57)
Iron: 83 ug/dL (ref 41–142)
TIBC: 263 ug/dL (ref 236–444)
UIBC: 180 ug/dL (ref 120–384)

## 2016-08-02 LAB — FERRITIN: Ferritin: 556 ng/ml — ABNORMAL HIGH (ref 9–269)

## 2016-08-02 MED ORDER — DARBEPOETIN ALFA 300 MCG/0.6ML IJ SOSY
PREFILLED_SYRINGE | INTRAMUSCULAR | Status: AC
  Filled 2016-08-02: qty 0.6

## 2016-08-02 MED ORDER — DARBEPOETIN ALFA 300 MCG/0.6ML IJ SOSY
300.0000 ug | PREFILLED_SYRINGE | Freq: Once | INTRAMUSCULAR | Status: AC
  Administered 2016-08-02: 300 ug via SUBCUTANEOUS

## 2016-08-02 NOTE — Progress Notes (Signed)
Hematology and Oncology Follow Up Visit  Kelly Soto [id number] 03-11-1956 61 y.o. 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 She is doing well but has occasional episodes of fatigue.  She states that she had an endoscopy with Dr. Paulita Fujita last week and that she had an ulcer that had healed. She has been symptomatic with nausea and weight loss with occasional diarrhea.  She is scheduled for small bowel study on 08/07/16 for further evaluation.  She states that she only vomits if she over eats and avoids this trigger.  She states that since starting Glyxambi her blood sugars have been much more controlled.  She is eating well and staying hydrated. Her weight is stable.  No fever, chills, rash, cough, dizziness, SOB, chest pain, palpitations, abdominal pain or changes in bowel or bladder habits.  No swelling, tenderness, numbness or tingling in her extremities.   Medications:  Allergies as of 08/02/2016      Reactions   Aspirin Other (See Comments)   Cannot take due to gastric bypass surgery   Nsaids Anaphylaxis   Ace Inhibitors Other (See Comments)   Angioedema   Sular [nisoldipine Er] Other (See Comments)   angioedema   Boniva [ibandronic Acid] Other (See Comments)   Joint pain      Medication List       Accurate as of 08/02/16  8:12 AM. Always use your most recent med list.          acetaminophen 500 MG chewable tablet Commonly known as:  TYLENOL Chew 500-1,000 mg by mouth every 6 (six) hours as needed for pain (depends on pain level).   baclofen 10 MG tablet Commonly known as:  LIORESAL Take 10 mg by mouth daily as needed. migraines   denosumab 60 MG/ML Soln injection Commonly known  as:  PROLIA Inject 60 mg into the skin every 6 (six) months. Administer in upper arm, thigh, or abdomen  Next injection is due 05-2016   diazepam 5 MG tablet Commonly known as:  VALIUM Take 5 mg by mouth daily as needed for anxiety.   fluticasone 50 MCG/ACT nasal spray Commonly known as:  FLONASE Place 2 sprays into both nostrils at bedtime.   glyBURIDE-metformin 2.5-500 MG tablet Commonly known as:  GLUCOVANCE Take 2 tablets by mouth 2 (two) times daily.   GLYXAMBI 25-5 MG Tabs Generic drug:  Empagliflozin-Linagliptin Take 1 tablet by mouth daily.   hydroxypropyl methylcellulose / hypromellose 2.5 % ophthalmic solution Commonly known as:  ISOPTO TEARS / GONIOVISC Place 1 drop into both eyes 3 (three) times daily.   meclizine 25 MG tablet Commonly known as:  ANTIVERT TAKE 1 TABLET BY MOUTH 3 TIMES A DAY AS NEEDED FOR VERTIGO   metoprolol succinate 25 MG 24 hr tablet Commonly known as:  TOPROL-XL Take 25 mg by mouth every morning.   omeprazole 40 MG capsule Commonly known as:  PRILOSEC Take 40 mg by mouth 2 (two) times daily.   promethazine 25 MG tablet Commonly known as:  PHENERGAN Take 25-50 mg by mouth every 8 (eight) hours as needed (for migraine induced nausea).   topiramate 25 MG tablet Commonly known as:  TOPAMAX Take 75 mg by mouth daily.  Allergies:  Allergies  Allergen Reactions  . Aspirin Other (See Comments)    Cannot take due to gastric bypass surgery  . Nsaids Anaphylaxis  . Ace Inhibitors Other (See Comments)    Angioedema  . Sular [Nisoldipine Er] Other (See Comments)    angioedema  . Boniva [Ibandronic Acid] Other (See Comments)    Joint pain    Past Medical History, Surgical history, Social history, and Family History were reviewed and updated.  Review of Systems: All other 10 point review of systems is negative.   Physical Exam:  weight is 121 lb (54.9 kg). Her oral temperature is 98.5 F (36.9 C). Her blood pressure is 135/68  and her pulse is 83. Her respiration is 20.   Wt Readings from Last 3 Encounters:  08/02/16 121 lb (54.9 kg)  07/05/16 123 lb 9.6 oz (56.1 kg)  06/01/16 124 lb (56.2 kg)    Ocular: Sclerae unicteric, pupils equal, round and reactive to light Ear-nose-throat: Oropharynx clear, dentition fair Lymphatic: No cervical supraclavicular or axillary adenopathy Lungs no rales or rhonchi, good excursion bilaterally Heart regular rate and rhythm, no murmur appreciated Abd soft, nontender, positive bowel sounds, no liver or spleen tip palpated on exam MSK no focal spinal tenderness, no joint edema Neuro: non-focal, well-oriented, appropriate affect Breasts: Deferred  Lab Results  Component Value Date   WBC 7.7 06/29/2016   HGB 10.2 (L) 06/29/2016   HCT 32.3 (L) 06/29/2016   MCV 91.5 06/29/2016   PLT 205 06/29/2016   Lab Results  Component Value Date   FERRITIN 411 (H) 06/01/2016   IRON 94 06/01/2016   TIBC 235 (L) 06/01/2016   UIBC 141 06/01/2016   IRONPCTSAT 40 06/01/2016   Lab Results  Component Value Date   RETICCTPCT 0.6 01/12/2015   RBC 3.53 (L) 06/29/2016   RETICCTABS 21.6 01/12/2015   No results found for: KPAFRELGTCHN, LAMBDASER, KAPLAMBRATIO No results found for: Kandis Cocking, [facility] Lab Results  Component Value Date   TOTALPROTELP 7.3 07/23/2013   ALBUMINELP 61.9 07/23/2013   A1GS 3.7 07/23/2013   A2GS 10.8 07/23/2013   BETS 7.0 07/23/2013   BETA2SER 3.8 07/23/2013   GAMS 12.8 07/23/2013   MSPIKE NOT DET 07/23/2013   SPEI * 07/23/2013     Chemistry      Component Value Date/Time   NA 136 06/29/2016 0916   NA 139 06/01/2016 0746   K 4.7 06/29/2016 0916   K 5.6 (H) 06/01/2016 0746   CL 108 06/29/2016 0916   CL 103 03/02/2015 0926   CO2 20 (L) 06/29/2016 0916   CO2 21 (L) 06/01/2016 0746   BUN 29 (H) 06/29/2016 0916   BUN 42.2 (H) 06/01/2016 0746   CREATININE 1.39 (H) 06/29/2016 0916   CREATININE 1.5 (H) 06/01/2016 0746      Component Value Date/Time    CALCIUM 9.1 06/29/2016 0916   CALCIUM 9.3 06/01/2016 0746   ALKPHOS 55 06/01/2016 0746   AST 15 06/01/2016 0746   ALT 13 06/01/2016 0746   BILITOT 0.25 06/01/2016 0746     Impression and Plan: Kelly Soto is a pleasant 61 yo white female with iron deficiency anemia secondary to malabsorption (gastric bypass) and anemia of chronic renal insufficiency. She has responded nicely to IV iron and Aranesp. Hgb today is 10.7 so she will get Aranesp today as planned.  We will see what her iron studies show and bring her back in for an infusion later this week if needed.    We  will plan to see her back in 2 months for repeat lab work and follow-up.  She will contact our office with any questions or concerns. We can certainly see her sooner if need be.   Eliezer Bottom, NP 2/7/20188:12 AM

## 2016-08-02 NOTE — Telephone Encounter (Addendum)
Patient aware of results  ----- Message from Eliezer Bottom, NP sent at 08/02/2016  1:05 PM EST ----- Regarding: Iron  Iron looks great! No infusion needed at this time. Thank you!  Sarah  ----- Message ----- From: Volanda Napoleon, MD Sent: 08/02/2016  12:59 PM To: Eliezer Bottom, NP    ----- Message ----- From: Interface, Lab In Three Zero One Sent: 08/02/2016   8:26 AM To: Volanda Napoleon, MD

## 2016-08-02 NOTE — Patient Instructions (Signed)

## 2016-08-03 ENCOUNTER — Other Ambulatory Visit: Payer: BLUE CROSS/BLUE SHIELD

## 2016-08-07 ENCOUNTER — Other Ambulatory Visit: Payer: Self-pay | Admitting: Gastroenterology

## 2016-08-07 ENCOUNTER — Ambulatory Visit
Admission: RE | Admit: 2016-08-07 | Discharge: 2016-08-07 | Disposition: A | Discharge status: Home / Self Care | Payer: BLUE CROSS/BLUE SHIELD | Source: Ambulatory Visit | Attending: Gastroenterology | Admitting: Gastroenterology

## 2016-08-07 DIAGNOSIS — R1084 Generalized abdominal pain: Secondary | ICD-10-CM

## 2016-08-15 ENCOUNTER — Other Ambulatory Visit (HOSPITAL_COMMUNITY): Payer: BLUE CROSS/BLUE SHIELD

## 2016-08-22 ENCOUNTER — Other Ambulatory Visit: Payer: Self-pay | Admitting: Gastroenterology

## 2016-08-22 DIAGNOSIS — R1010 Upper abdominal pain, unspecified: Secondary | ICD-10-CM

## 2016-08-31 ENCOUNTER — Ambulatory Visit
Admission: RE | Admit: 2016-08-31 | Discharge: 2016-08-31 | Disposition: A | Discharge status: Home / Self Care | Payer: BLUE CROSS/BLUE SHIELD | Source: Ambulatory Visit | Attending: Gastroenterology | Admitting: Gastroenterology

## 2016-08-31 ENCOUNTER — Other Ambulatory Visit: Payer: Self-pay | Admitting: Family Medicine

## 2016-08-31 DIAGNOSIS — Z1231 Encounter for screening mammogram for malignant neoplasm of breast: Secondary | ICD-10-CM

## 2016-08-31 DIAGNOSIS — R1010 Upper abdominal pain, unspecified: Secondary | ICD-10-CM

## 2016-08-31 MED ORDER — GADOBENATE DIMEGLUMINE 529 MG/ML IV SOLN
6.0000 mL | Freq: Once | INTRAVENOUS | Status: AC | PRN
  Administered 2016-08-31: 6 mL via INTRAVENOUS

## 2016-09-21 ENCOUNTER — Ambulatory Visit
Admission: RE | Admit: 2016-09-21 | Discharge: 2016-09-21 | Disposition: A | Discharge status: Home / Self Care | Payer: BLUE CROSS/BLUE SHIELD | Source: Ambulatory Visit | Attending: Family Medicine | Admitting: Family Medicine

## 2016-09-21 DIAGNOSIS — Z1231 Encounter for screening mammogram for malignant neoplasm of breast: Secondary | ICD-10-CM

## 2016-10-05 ENCOUNTER — Ambulatory Visit: Payer: BLUE CROSS/BLUE SHIELD

## 2016-10-05 ENCOUNTER — Ambulatory Visit (HOSPITAL_BASED_OUTPATIENT_CLINIC_OR_DEPARTMENT_OTHER): Payer: BLUE CROSS/BLUE SHIELD | Admitting: Hematology & Oncology

## 2016-10-05 ENCOUNTER — Encounter (HOSPITAL_BASED_OUTPATIENT_CLINIC_OR_DEPARTMENT_OTHER): Payer: Self-pay

## 2016-10-05 ENCOUNTER — Encounter: Payer: Self-pay | Admitting: *Deleted

## 2016-10-05 ENCOUNTER — Other Ambulatory Visit (HOSPITAL_BASED_OUTPATIENT_CLINIC_OR_DEPARTMENT_OTHER): Payer: BLUE CROSS/BLUE SHIELD

## 2016-10-05 ENCOUNTER — Ambulatory Visit (HOSPITAL_BASED_OUTPATIENT_CLINIC_OR_DEPARTMENT_OTHER)
Admission: RE | Admit: 2016-10-05 | Discharge: 2016-10-05 | Disposition: A | Discharge status: Home / Self Care | Payer: BLUE CROSS/BLUE SHIELD | Source: Ambulatory Visit | Attending: Hematology & Oncology | Admitting: Hematology & Oncology

## 2016-10-05 VITALS — BP 149/76 | HR 80 | Temp 98.4°F | Resp 17 | Wt 118.0 lb

## 2016-10-05 DIAGNOSIS — R911 Solitary pulmonary nodule: Secondary | ICD-10-CM

## 2016-10-05 DIAGNOSIS — D631 Anemia in chronic kidney disease: Secondary | ICD-10-CM

## 2016-10-05 DIAGNOSIS — R109 Unspecified abdominal pain: Secondary | ICD-10-CM

## 2016-10-05 DIAGNOSIS — I700 Atherosclerosis of aorta: Secondary | ICD-10-CM | POA: Diagnosis not present

## 2016-10-05 DIAGNOSIS — E119 Type 2 diabetes mellitus without complications: Secondary | ICD-10-CM

## 2016-10-05 DIAGNOSIS — R918 Other nonspecific abnormal finding of lung field: Secondary | ICD-10-CM | POA: Diagnosis not present

## 2016-10-05 DIAGNOSIS — D508 Other iron deficiency anemias: Secondary | ICD-10-CM

## 2016-10-05 DIAGNOSIS — D509 Iron deficiency anemia, unspecified: Secondary | ICD-10-CM

## 2016-10-05 DIAGNOSIS — D3502 Benign neoplasm of left adrenal gland: Secondary | ICD-10-CM | POA: Diagnosis not present

## 2016-10-05 DIAGNOSIS — N183 Chronic kidney disease, stage 3 unspecified: Secondary | ICD-10-CM

## 2016-10-05 DIAGNOSIS — R634 Abnormal weight loss: Secondary | ICD-10-CM

## 2016-10-05 DIAGNOSIS — K9049 Malabsorption due to intolerance, not elsewhere classified: Secondary | ICD-10-CM

## 2016-10-05 DIAGNOSIS — I2510 Atherosclerotic heart disease of native coronary artery without angina pectoris: Secondary | ICD-10-CM | POA: Insufficient documentation

## 2016-10-05 DIAGNOSIS — N189 Chronic kidney disease, unspecified: Secondary | ICD-10-CM

## 2016-10-05 LAB — CBC WITH DIFFERENTIAL (CANCER CENTER ONLY)
BASO#: 0 10*3/uL (ref 0.0–0.2)
BASO%: 0.4 % (ref 0.0–2.0)
EOS%: 2.2 % (ref 0.0–7.0)
Eosinophils Absolute: 0.2 10*3/uL (ref 0.0–0.5)
HCT: 36.4 % (ref 34.8–46.6)
HGB: 11.6 g/dL (ref 11.6–15.9)
LYMPH#: 1.5 10*3/uL (ref 0.9–3.3)
LYMPH%: 21.2 % (ref 14.0–48.0)
MCH: 30.3 pg (ref 26.0–34.0)
MCHC: 31.9 g/dL — ABNORMAL LOW (ref 32.0–36.0)
MCV: 95 fL (ref 81–101)
MONO#: 0.4 10*3/uL (ref 0.1–0.9)
MONO%: 6.1 % (ref 0.0–13.0)
NEUT#: 4.9 10*3/uL (ref 1.5–6.5)
NEUT%: 70.1 % (ref 39.6–80.0)
Platelets: 217 10*3/uL (ref 145–400)
RBC: 3.83 10*6/uL (ref 3.70–5.32)
RDW: 13.4 % (ref 11.1–15.7)
WBC: 6.9 10*3/uL (ref 3.9–10.0)

## 2016-10-05 LAB — COMPREHENSIVE METABOLIC PANEL
ALT: 22 U/L (ref 0–55)
AST: 18 U/L (ref 5–34)
Albumin: 3.9 g/dL (ref 3.5–5.0)
Alkaline Phosphatase: 51 U/L (ref 40–150)
Anion Gap: 11 mEq/L (ref 3–11)
BUN: 36.9 mg/dL — ABNORMAL HIGH (ref 7.0–26.0)
CO2: 22 mEq/L (ref 22–29)
Calcium: 9.8 mg/dL (ref 8.4–10.4)
Chloride: 106 mEq/L (ref 98–109)
Creatinine: 1.5 mg/dL — ABNORMAL HIGH (ref 0.6–1.1)
EGFR: 37 mL/min/{1.73_m2} — ABNORMAL LOW (ref >90)
Glucose: 232 mg/dl — ABNORMAL HIGH (ref 70–140)
Potassium: 5.4 mEq/L — ABNORMAL HIGH (ref 3.5–5.1)
Sodium: 138 mEq/L (ref 136–145)
Total Bilirubin: 0.23 mg/dL (ref 0.20–1.20)
Total Protein: 6.9 g/dL (ref 6.4–8.3)

## 2016-10-05 LAB — IRON AND TIBC
%SAT: 37 % (ref 21–57)
Iron: 96 ug/dL (ref 41–142)
TIBC: 256 ug/dL (ref 236–444)
UIBC: 160 ug/dL (ref 120–384)

## 2016-10-05 LAB — FERRITIN: Ferritin: 469 ng/ml — ABNORMAL HIGH (ref 9–269)

## 2016-10-05 MED ORDER — DARBEPOETIN ALFA 300 MCG/0.6ML IJ SOSY
PREFILLED_SYRINGE | INTRAMUSCULAR | Status: AC
  Filled 2016-10-05: qty 0.6

## 2016-10-05 MED ORDER — IOPAMIDOL (ISOVUE-300) INJECTION 61%
100.0000 mL | Freq: Once | INTRAVENOUS | Status: AC | PRN
  Administered 2016-10-05: 64 mL via INTRAVENOUS

## 2016-10-05 NOTE — Progress Notes (Signed)
Hematology and Oncology Follow Up Visit  Kelly Soto [id number] 1956/05/06 61 y.o. 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 is back for follow-up. She is having some abdominal issues. Should her gallbladder taken out back in November. This seemed to help initially. However, she's been having more in the way of belly pain. She did have a CT of the abdomen recently. This does not show anything that was obvious. However, there were a couple lower lobe pulmonary nodules that were seen. She is not a smoker.  She was down in [facility] will come weeks ago. She and her husband had a very good time down there.   She's had no bleeding. She sees her diabetic doctor next week.  She's had no fever. She's had no rashes. She's had no obvious change in bowel or bladder habits.   Overall, her performance status is ECOG 1.     Medications:  Current Outpatient Prescriptions:  .  acetaminophen (TYLENOL) 500 MG chewable tablet, Chew 500-1,000 mg by mouth every 6 (six) hours as needed for pain (depends on pain level)., Disp: , Rfl:  .  baclofen (LIORESAL) 10 MG tablet, Take 10 mg by mouth daily as needed. migraines, Disp: , Rfl:  .  denosumab (PROLIA) 60 MG/ML SOLN injection, Inject 60 mg into the skin every 6 (six) months. Administer in upper arm, thigh, or abdomen  Next injection is due 05-2016, Disp: , Rfl:  .  diazepam (VALIUM) 5 MG tablet, Take 5 mg by mouth daily as needed for anxiety. , Disp: , Rfl: 0 .  doxycycline (VIBRA-TABS) 100 MG tablet, Take 100 mg by mouth., Disp: , Rfl:  .  Empagliflozin-Linagliptin (GLYXAMBI PO), Take by mouth., Disp: , Rfl:  .  fluticasone (FLONASE) 50 MCG/ACT nasal spray, Place 2 sprays into both  nostrils at bedtime., Disp: , Rfl:  .  glyBURIDE-metformin (GLUCOVANCE) 2.5-500 MG per tablet, Take 2 tablets by mouth 2 (two) times daily., Disp: , Rfl:  .  hydroxypropyl methylcellulose / hypromellose (ISOPTO TEARS / GONIOVISC) 2.5 % ophthalmic solution, Place 1 drop into both eyes 3 (three) times daily., Disp: , Rfl:  .  meclizine (ANTIVERT) 25 MG tablet, TAKE 1 TABLET BY MOUTH 3 TIMES A DAY AS NEEDED FOR VERTIGO, Disp: , Rfl: 0 .  metoprolol succinate (TOPROL-XL) 25 MG 24 hr tablet, Take 25 mg by mouth every morning., Disp: , Rfl:  .  omeprazole (PRILOSEC) 40 MG capsule, Take 40 mg by mouth 2 (two) times daily., Disp: , Rfl:  .  promethazine (PHENERGAN) 25 MG tablet, Take 25-50 mg by mouth every 8 (eight) hours as needed (for migraine induced nausea)., Disp: , Rfl:  .  QUDEXY XR 100 MG CS24, Take 100 mg by mouth daily., Disp: , Rfl:   Allergies:  Allergies  Allergen Reactions  . Aspirin Other (See Comments)    Cannot take due to gastric bypass surgery  . Nsaids Anaphylaxis  . Ace Inhibitors Other (See Comments)    Angioedema  . Sular [Nisoldipine Er] Other (See Comments)    angioedema  . Boniva [Ibandronic Acid] Other (See Comments)    Joint pain    Past Medical History, Surgical history, Social history, and Family History  were reviewed and updated.  Review of Systems: As above  Physical Exam:  weight is 118 lb 0.6 oz (53.5 kg). Her oral temperature is 98.4 F (36.9 C). Her blood pressure is 149/76 (abnormal) and her pulse is 80. Her respiration is 17 and oxygen saturation is 100%.   Well-developed and well-nourished white female in no obvious distress. Head and neck exam shows no ocular or oral lesions. She is no palpable cervical or supraclavicular lymph nodes. Lungs are clear with no wheezes or rhonchi. Cardiac exam regular rate and rhythm with no murmurs rubs or bruits. Abdomen is soft. She is good bowel sounds. There is no fluid wave. There is no palpable liver or spleen tip.  Back exam shows no tenderness over the spine ribs or hips. Extremities shows no clubbing cyanosis or edema. Skin exam shows a macular type rash on her forearms.. Neurological exam is nonfocal.  Lab Results  Component Value Date   WBC 6.9 10/05/2016   HGB 11.6 10/05/2016   HCT 36.4 10/05/2016   MCV 95 10/05/2016   PLT 217 10/05/2016     Chemistry      Component Value Date/Time   NA 143 08/02/2016 0745   NA 139 06/01/2016 0746   K 4.8 (H) 08/02/2016 0745   K 5.6 (H) 06/01/2016 0746   CL 108 08/02/2016 0745   CO2 22 08/02/2016 0745   CO2 21 (L) 06/01/2016 0746   BUN 33 (H) 08/02/2016 0745   BUN 42.2 (H) 06/01/2016 0746   CREATININE 1.3 (H) 08/02/2016 0745   CREATININE 1.5 (H) 06/01/2016 0746      Component Value Date/Time   CALCIUM 9.3 08/02/2016 0745   CALCIUM 9.3 06/01/2016 0746   ALKPHOS 47 08/02/2016 0745   ALKPHOS 55 06/01/2016 0746   AST 22 08/02/2016 0745   AST 15 06/01/2016 0746   ALT 25 08/02/2016 0745   ALT 13 06/01/2016 0746   BILITOT 0.40 08/02/2016 0745   BILITOT 0.25 06/01/2016 0746         Impression and Plan: Kelly Soto is 61 year old white female. She has diabetes. She has iron deficiency anemia. She has a very low erythropoietin level.  I'm not too sure is going on with the weight loss. She just does not think that having her gallbladder out really has helped. She still having some abdominal pain.  She had a CT scan done back in February. There was some lower lobe pulmonary nodules. I think that we probably need to get a formal CT scan of the chest to make sure nothing is going on.  She does not need any Aranesp today. I'm sure her iron studies are also okay.  I will like to get her back in 1 month just to follow-up with the issues with her weight loss.  Kelly Napoleon, MD 4/12/20189:14 AM

## 2016-10-06 ENCOUNTER — Telehealth: Payer: Self-pay | Admitting: *Deleted

## 2016-10-06 NOTE — Telephone Encounter (Addendum)
Patient aware of results  ----- Message from Eliezer Bottom, NP sent at 10/05/2016  5:08 PM EDT ----- Regarding: Iron  Iron studies look good. No infusion needed. Thank you!  Kelly Soto  ----- Message ----- From: Interface, Lab In Three Zero One Sent: 10/05/2016   9:10 AM To: Eliezer Bottom, NP

## 2016-11-06 ENCOUNTER — Other Ambulatory Visit: Payer: Self-pay | Admitting: *Deleted

## 2016-11-06 DIAGNOSIS — N189 Chronic kidney disease, unspecified: Secondary | ICD-10-CM

## 2016-11-06 DIAGNOSIS — D631 Anemia in chronic kidney disease: Secondary | ICD-10-CM

## 2016-11-07 ENCOUNTER — Encounter: Payer: Self-pay | Admitting: *Deleted

## 2016-11-07 ENCOUNTER — Ambulatory Visit (HOSPITAL_BASED_OUTPATIENT_CLINIC_OR_DEPARTMENT_OTHER): Payer: BLUE CROSS/BLUE SHIELD | Admitting: Hematology & Oncology

## 2016-11-07 ENCOUNTER — Ambulatory Visit: Payer: BLUE CROSS/BLUE SHIELD

## 2016-11-07 ENCOUNTER — Other Ambulatory Visit (HOSPITAL_BASED_OUTPATIENT_CLINIC_OR_DEPARTMENT_OTHER): Payer: BLUE CROSS/BLUE SHIELD

## 2016-11-07 VITALS — BP 162/79 | HR 85 | Temp 98.1°F | Resp 17 | Wt 115.8 lb

## 2016-11-07 DIAGNOSIS — R911 Solitary pulmonary nodule: Secondary | ICD-10-CM | POA: Diagnosis not present

## 2016-11-07 DIAGNOSIS — N189 Chronic kidney disease, unspecified: Secondary | ICD-10-CM

## 2016-11-07 DIAGNOSIS — E119 Type 2 diabetes mellitus without complications: Secondary | ICD-10-CM

## 2016-11-07 DIAGNOSIS — R1013 Epigastric pain: Secondary | ICD-10-CM | POA: Diagnosis not present

## 2016-11-07 DIAGNOSIS — D509 Iron deficiency anemia, unspecified: Secondary | ICD-10-CM

## 2016-11-07 DIAGNOSIS — D631 Anemia in chronic kidney disease: Secondary | ICD-10-CM

## 2016-11-07 DIAGNOSIS — K903 Pancreatic steatorrhea: Secondary | ICD-10-CM

## 2016-11-07 LAB — CBC WITH DIFFERENTIAL (CANCER CENTER ONLY)
BASO#: 0 10*3/uL (ref 0.0–0.2)
BASO%: 0.4 % (ref 0.0–2.0)
EOS%: 1.2 % (ref 0.0–7.0)
Eosinophils Absolute: 0.1 10*3/uL (ref 0.0–0.5)
HCT: 35.9 % (ref 34.8–46.6)
HGB: 11.3 g/dL — ABNORMAL LOW (ref 11.6–15.9)
LYMPH#: 1.9 10*3/uL (ref 0.9–3.3)
LYMPH%: 20.1 % (ref 14.0–48.0)
MCH: 29.7 pg (ref 26.0–34.0)
MCHC: 31.5 g/dL — ABNORMAL LOW (ref 32.0–36.0)
MCV: 94 fL (ref 81–101)
MONO#: 0.6 10*3/uL (ref 0.1–0.9)
MONO%: 6.7 % (ref 0.0–13.0)
NEUT#: 6.7 10*3/uL — ABNORMAL HIGH (ref 1.5–6.5)
NEUT%: 71.6 % (ref 39.6–80.0)
Platelets: 248 10*3/uL (ref 145–400)
RBC: 3.81 10*6/uL (ref 3.70–5.32)
RDW: 13.8 % (ref 11.1–15.7)
WBC: 9.4 10*3/uL (ref 3.9–10.0)

## 2016-11-07 LAB — COMPREHENSIVE METABOLIC PANEL
ALT: 57 U/L — ABNORMAL HIGH (ref 0–55)
AST: 33 U/L (ref 5–34)
Albumin: 3.9 g/dL (ref 3.5–5.0)
Alkaline Phosphatase: 52 U/L (ref 40–150)
Anion Gap: 10 mEq/L (ref 3–11)
BUN: 31.6 mg/dL — ABNORMAL HIGH (ref 7.0–26.0)
CO2: 21 mEq/L — ABNORMAL LOW (ref 22–29)
Calcium: 9.8 mg/dL (ref 8.4–10.4)
Chloride: 108 mEq/L (ref 98–109)
Creatinine: 1.4 mg/dL — ABNORMAL HIGH (ref 0.6–1.1)
EGFR: 41 mL/min/{1.73_m2} — ABNORMAL LOW (ref >90)
Glucose: 253 mg/dl — ABNORMAL HIGH (ref 70–140)
Potassium: 5.3 mEq/L — ABNORMAL HIGH (ref 3.5–5.1)
Sodium: 138 mEq/L (ref 136–145)
Total Bilirubin: <0.22 mg/dL (ref 0.20–1.20)
Total Protein: 7.1 g/dL (ref 6.4–8.3)

## 2016-11-07 LAB — IRON AND TIBC
%SAT: 39 % (ref 21–57)
Iron: 99 ug/dL (ref 41–142)
TIBC: 250 ug/dL (ref 236–444)
UIBC: 152 ug/dL (ref 120–384)

## 2016-11-07 LAB — FERRITIN: Ferritin: 660 ng/ml — ABNORMAL HIGH (ref 9–269)

## 2016-11-07 MED ORDER — PANCRELIPASE (LIP-PROT-AMYL) 24000-76000 UNITS PO CPEP: 1.0000 | ORAL_CAPSULE | Freq: Three times a day (TID) | ORAL | 4 refills | Status: DC

## 2016-11-07 NOTE — Progress Notes (Signed)
Hematology and Oncology Follow Up Visit  Kelly Soto [id number] Apr 17, 1956 61 y.o. 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 is back for follow-up. She still is having some abdominal issues. They're not as bad. She gets this pain, that is epigastric. She is on Prilosec. The pain seems to occur after meals.  We did do a CT of the chest. This was relatively unrevealing. She did have some pulmonary nodules. They appear to be somewhat stable. It was recommended that a follow-up scan be done in January 2019.  She's had no cough. There's been no further weight loss. She's had no chronic diarrhea. Her stools are a little bit loose.  Her iron studies haven't doing pretty well. We did not have to give her Aranesp for a while.   Overall, her performance status is ECOG 1.     Medications:  Current Outpatient Prescriptions:  .  acetaminophen (TYLENOL) 500 MG chewable tablet, Chew 500-1,000 mg by mouth every 6 (six) hours as needed for pain (depends on pain level)., Disp: , Rfl:  .  baclofen (LIORESAL) 10 MG tablet, Take 10 mg by mouth daily as needed. migraines, Disp: , Rfl:  .  denosumab (PROLIA) 60 MG/ML SOLN injection, Inject 60 mg into the skin every 6 (six) months. Administer in upper arm, thigh, or abdomen  Next injection is due 05-2016, Disp: , Rfl:  .  diazepam (VALIUM) 5 MG tablet, Take 5 mg by mouth daily as needed for anxiety. , Disp: , Rfl: 0 .  doxycycline (VIBRA-TABS) 100 MG tablet, Take 100 mg by mouth., Disp: , Rfl:  .  Empagliflozin-Linagliptin (GLYXAMBI PO), Take by mouth., Disp: , Rfl:  .  fluticasone (FLONASE) 50 MCG/ACT nasal spray, Place 2 sprays into both nostrils at bedtime., Disp: , Rfl:  .   glyBURIDE-metformin (GLUCOVANCE) 2.5-500 MG per tablet, Take 2 tablets by mouth 2 (two) times daily., Disp: , Rfl:  .  hydroxypropyl methylcellulose / hypromellose (ISOPTO TEARS / GONIOVISC) 2.5 % ophthalmic solution, Place 1 drop into both eyes 3 (three) times daily., Disp: , Rfl:  .  meclizine (ANTIVERT) 25 MG tablet, TAKE 1 TABLET BY MOUTH 3 TIMES A DAY AS NEEDED FOR VERTIGO, Disp: , Rfl: 0 .  metoprolol succinate (TOPROL-XL) 25 MG 24 hr tablet, Take 25 mg by mouth every morning., Disp: , Rfl:  .  omeprazole (PRILOSEC) 40 MG capsule, Take 40 mg by mouth 2 (two) times daily., Disp: , Rfl:  .  promethazine (PHENERGAN) 25 MG tablet, Take 25-50 mg by mouth every 8 (eight) hours as needed (for migraine induced nausea)., Disp: , Rfl:  .  QUDEXY XR 100 MG CS24, Take 100 mg by mouth daily., Disp: , Rfl:   Allergies:  Allergies  Allergen Reactions  . Aspirin Other (See Comments)    Cannot take due to gastric bypass surgery  . Nsaids Anaphylaxis  . Ace Inhibitors Other (See Comments)    Angioedema  . Sular [Nisoldipine Er] Other (See Comments)    angioedema  . Boniva [Ibandronic Acid] Other (See Comments)    Joint pain    Past Medical History, Surgical history, Social history, and Family History were reviewed and updated.  Review of Systems: As above  Physical Exam:  vitals were not taken for this visit.  Well-developed and well-nourished white female in no obvious distress. Head and neck exam shows no ocular or oral lesions. She is no palpable cervical or supraclavicular lymph nodes. Lungs are clear with no wheezes or rhonchi. Cardiac exam regular rate and rhythm with no murmurs rubs or bruits. Abdomen is soft. She is good bowel sounds. There is no fluid wave. There is no palpable liver or spleen tip. Back exam shows no tenderness over the spine ribs or hips. Extremities shows no clubbing cyanosis or edema. Skin exam shows a macular type rash on her forearms.. Neurological exam is  nonfocal.  Lab Results  Component Value Date   WBC 9.4 11/07/2016   HGB 11.3 (L) 11/07/2016   HCT 35.9 11/07/2016   MCV 94 11/07/2016   PLT 248 11/07/2016     Chemistry      Component Value Date/Time   NA 138 10/05/2016 0855   K 5.4 (H) 10/05/2016 0855   CL 108 08/02/2016 0745   CO2 22 10/05/2016 0855   BUN 36.9 (H) 10/05/2016 0855   CREATININE 1.5 (H) 10/05/2016 0855      Component Value Date/Time   CALCIUM 9.8 10/05/2016 0855   ALKPHOS 51 10/05/2016 0855   AST 18 10/05/2016 0855   ALT 22 10/05/2016 0855   BILITOT 0.23 10/05/2016 0855         Impression and Plan: Kelly Soto is 61 year old white female. She has diabetes. She has iron deficiency anemia. She has a very low erythropoietin level.  It sounds like she has some transient pancreatic insufficiency. As such, I will try her on Creon. I don't see any harm with getting her on Creon to see if this helps with these bouts of abdominal pain.  She's not sure when she sees the gastroenterologist. She is still on Prilosec twice a day at 40 mg.   I will like to see her back in 6 weeks. Over, she will be feeling a little bit better.   I spent about 25 minutes with her today.  Volanda Napoleon, MD 5/15/201811:36 AM

## 2016-11-08 ENCOUNTER — Telehealth: Payer: Self-pay | Admitting: *Deleted

## 2016-11-08 NOTE — Telephone Encounter (Addendum)
Patient is aware of results  ----- Message from Volanda Napoleon, MD sent at 11/07/2016  3:25 PM EDT ----- Call - iron level is ok!!  Kelly Soto

## 2016-12-05 ENCOUNTER — Other Ambulatory Visit: Payer: Self-pay | Admitting: *Deleted

## 2016-12-05 DIAGNOSIS — D631 Anemia in chronic kidney disease: Secondary | ICD-10-CM

## 2016-12-05 DIAGNOSIS — N189 Chronic kidney disease, unspecified: Secondary | ICD-10-CM

## 2016-12-06 ENCOUNTER — Ambulatory Visit (HOSPITAL_BASED_OUTPATIENT_CLINIC_OR_DEPARTMENT_OTHER): Payer: BLUE CROSS/BLUE SHIELD | Admitting: Hematology & Oncology

## 2016-12-06 ENCOUNTER — Ambulatory Visit (HOSPITAL_BASED_OUTPATIENT_CLINIC_OR_DEPARTMENT_OTHER): Payer: BLUE CROSS/BLUE SHIELD

## 2016-12-06 ENCOUNTER — Other Ambulatory Visit (HOSPITAL_BASED_OUTPATIENT_CLINIC_OR_DEPARTMENT_OTHER): Payer: BLUE CROSS/BLUE SHIELD

## 2016-12-06 VITALS — BP 142/66 | HR 73 | Temp 98.1°F | Resp 16 | Wt 116.0 lb

## 2016-12-06 DIAGNOSIS — D500 Iron deficiency anemia secondary to blood loss (chronic): Secondary | ICD-10-CM | POA: Diagnosis not present

## 2016-12-06 DIAGNOSIS — N189 Chronic kidney disease, unspecified: Secondary | ICD-10-CM | POA: Diagnosis not present

## 2016-12-06 DIAGNOSIS — N182 Chronic kidney disease, stage 2 (mild): Secondary | ICD-10-CM | POA: Diagnosis not present

## 2016-12-06 DIAGNOSIS — D631 Anemia in chronic kidney disease: Secondary | ICD-10-CM

## 2016-12-06 LAB — COMPREHENSIVE METABOLIC PANEL
ALT: 26 U/L (ref 0–55)
AST: 23 U/L (ref 5–34)
Albumin: 3.5 g/dL (ref 3.5–5.0)
Alkaline Phosphatase: 41 U/L (ref 40–150)
Anion Gap: 7 mEq/L (ref 3–11)
BUN: 26.1 mg/dL — ABNORMAL HIGH (ref 7.0–26.0)
CO2: 23 mEq/L (ref 22–29)
Calcium: 9.1 mg/dL (ref 8.4–10.4)
Chloride: 107 mEq/L (ref 98–109)
Creatinine: 1.2 mg/dL — ABNORMAL HIGH (ref 0.6–1.1)
EGFR: 46 mL/min/{1.73_m2} — ABNORMAL LOW (ref >90)
Glucose: 256 mg/dl — ABNORMAL HIGH (ref 70–140)
Potassium: 5.2 mEq/L — ABNORMAL HIGH (ref 3.5–5.1)
Sodium: 137 mEq/L (ref 136–145)
Total Bilirubin: 0.26 mg/dL (ref 0.20–1.20)
Total Protein: 6.3 g/dL — ABNORMAL LOW (ref 6.4–8.3)

## 2016-12-06 LAB — CBC WITH DIFFERENTIAL (CANCER CENTER ONLY)
BASO#: 0 10*3/uL (ref 0.0–0.2)
BASO%: 0.6 % (ref 0.0–2.0)
EOS%: 3.8 % (ref 0.0–7.0)
Eosinophils Absolute: 0.3 10*3/uL (ref 0.0–0.5)
HCT: 32.8 % — ABNORMAL LOW (ref 34.8–46.6)
HGB: 10.5 g/dL — ABNORMAL LOW (ref 11.6–15.9)
LYMPH#: 1.6 10*3/uL (ref 0.9–3.3)
LYMPH%: 23.4 % (ref 14.0–48.0)
MCH: 29.8 pg (ref 26.0–34.0)
MCHC: 32 g/dL (ref 32.0–36.0)
MCV: 93 fL (ref 81–101)
MONO#: 0.6 10*3/uL (ref 0.1–0.9)
MONO%: 9.4 % (ref 0.0–13.0)
NEUT#: 4.3 10*3/uL (ref 1.5–6.5)
NEUT%: 62.8 % (ref 39.6–80.0)
Platelets: 222 10*3/uL (ref 145–400)
RBC: 3.52 10*6/uL — ABNORMAL LOW (ref 3.70–5.32)
RDW: 14.5 % (ref 11.1–15.7)
WBC: 6.8 10*3/uL (ref 3.9–10.0)

## 2016-12-06 LAB — IRON AND TIBC
%SAT: 33 % (ref 21–57)
Iron: 74 ug/dL (ref 41–142)
TIBC: 225 ug/dL — ABNORMAL LOW (ref 236–444)
UIBC: 151 ug/dL (ref 120–384)

## 2016-12-06 LAB — FERRITIN: Ferritin: 524 ng/ml — ABNORMAL HIGH (ref 9–269)

## 2016-12-06 MED ORDER — DARBEPOETIN ALFA 300 MCG/0.6ML IJ SOSY
PREFILLED_SYRINGE | INTRAMUSCULAR | Status: AC
  Filled 2016-12-06: qty 0.6

## 2016-12-06 MED ORDER — DARBEPOETIN ALFA 300 MCG/0.6ML IJ SOSY
300.0000 ug | PREFILLED_SYRINGE | Freq: Once | INTRAMUSCULAR | Status: AC
  Administered 2016-12-06: 300 ug via SUBCUTANEOUS

## 2016-12-06 NOTE — Patient Instructions (Signed)

## 2016-12-06 NOTE — Progress Notes (Signed)
Hematology and Oncology Follow Up Visit  Kelly Soto [id number] [birth year] 61 y.o. 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 is back for follow-up. She no longer has abdominal pain. She says the Creon that we gave her has helped her. I'm happy about this. Obviously, she is having some pancreatic issues if the Creon has helped.  She is a 4 to a nice quiet summer.  She's had no problems with nausea or vomiting. She's had no issues with cough. She's had no change in bowel or bladder habits.   We last saw her in May, her ferritin was 660 with iron saturation 39%.   Overall, her performance status is ECOG 1.     Medications:  Current Outpatient Prescriptions:  .  acetaminophen (TYLENOL) 500 MG chewable tablet, Chew 500-1,000 mg by mouth every 6 (six) hours as needed for pain (depends on pain level)., Disp: , Rfl:  .  baclofen (LIORESAL) 10 MG tablet, Take 10 mg by mouth daily as needed. migraines, Disp: , Rfl:  .  denosumab (PROLIA) 60 MG/ML SOLN injection, Inject 60 mg into the skin every 6 (six) months. Administer in upper arm, thigh, or abdomen  Next injection is due 05-2016, Disp: , Rfl:  .  diazepam (VALIUM) 5 MG tablet, Take 5 mg by mouth daily as needed for anxiety. , Disp: , Rfl: 0 .  doxycycline (VIBRA-TABS) 100 MG tablet, Take 100 mg by mouth., Disp: , Rfl:  .  Empagliflozin-Linagliptin (GLYXAMBI PO), Take by mouth., Disp: , Rfl:  .  fluticasone (FLONASE) 50 MCG/ACT nasal spray, Place 2 sprays into both nostrils at bedtime., Disp: , Rfl:  .  glyBURIDE-metformin (GLUCOVANCE) 2.5-500 MG per tablet, Take 2 tablets by mouth 2 (two) times daily., Disp: , Rfl:  .  hydroxypropyl methylcellulose / hypromellose  (ISOPTO TEARS / GONIOVISC) 2.5 % ophthalmic solution, Place 1 drop into both eyes 3 (three) times daily., Disp: , Rfl:  .  meclizine (ANTIVERT) 25 MG tablet, TAKE 1 TABLET BY MOUTH 3 TIMES A DAY AS NEEDED FOR VERTIGO, Disp: , Rfl: 0 .  metoprolol succinate (TOPROL-XL) 25 MG 24 hr tablet, Take 25 mg by mouth every morning., Disp: , Rfl:  .  omeprazole (PRILOSEC) 40 MG capsule, Take 40 mg by mouth 2 (two) times daily., Disp: , Rfl:  .  Pancrelipase, Lip-Prot-Amyl, 24000-76000 units CPEP, Take 1 capsule (24,000 Units total) by mouth 3 (three) times daily before meals., Disp: 180 capsule, Rfl: 4 .  promethazine (PHENERGAN) 25 MG tablet, Take 25-50 mg by mouth every 8 (eight) hours as needed (for migraine induced nausea)., Disp: , Rfl:  .  topiramate (TOPAMAX) 100 MG tablet, Take 100 mg by mouth daily., Disp: , Rfl: 5  Allergies:  Allergies  Allergen Reactions  . Aspirin Other (See Comments)    Cannot take due to gastric bypass surgery  . Nsaids Anaphylaxis  . Ace Inhibitors Other (See Comments)    Angioedema  . Sular [Nisoldipine Er] Other (See Comments)    angioedema  . Boniva [Ibandronic Acid] Other (See Comments)    Joint pain    Past Medical History, Surgical history, Social history, and Family History were reviewed and updated.  Review of Systems: As above  Physical Exam:  weight is 116 lb (52.6 kg). Her oral temperature is 98.1 F (36.7 C). Her blood pressure is 142/66 (abnormal) and her pulse is 73. Her respiration is 16 and oxygen saturation is 100%.   Well-developed and well-nourished white female in no obvious distress. Head and neck exam shows no ocular or oral lesions. She is no palpable cervical or supraclavicular lymph nodes. Lungs are clear with no wheezes or rhonchi. Cardiac exam regular rate and rhythm with no murmurs rubs or bruits. Abdomen is soft. She is good bowel sounds. There is no fluid wave. There is no palpable liver or spleen tip. Back exam shows no tenderness  over the spine ribs or hips. Extremities shows no clubbing cyanosis or edema. Skin exam shows a macular type rash on her forearms.. Neurological exam is nonfocal.  Lab Results  Component Value Date   WBC 6.8 12/06/2016   HGB 10.5 (L) 12/06/2016   HCT 32.8 (L) 12/06/2016   MCV 93 12/06/2016   PLT 222 12/06/2016     Chemistry      Component Value Date/Time   NA 138 11/07/2016 1103   K 5.3 (H) 11/07/2016 1103   CL 108 08/02/2016 0745   CO2 21 (L) 11/07/2016 1103   BUN 31.6 (H) 11/07/2016 1103   CREATININE 1.4 (H) 11/07/2016 1103      Component Value Date/Time   CALCIUM 9.8 11/07/2016 1103   ALKPHOS 52 11/07/2016 1103   AST 33 11/07/2016 1103   ALT 57 (H) 11/07/2016 1103   BILITOT <0.22 11/07/2016 1103         Impression and Plan: Kelly Soto is 61 year old white female. She has diabetes. She has iron deficiency anemia. She has a very low erythropoietin level.  I'm glad that the Creon as helped her. Hopefully, she will continue to eat well and maintaining her weight.  We will go ahead and give her Aranesp today. I think if we do this, then week and get her through the summer time.  I'll plan to see her back in 3 months.   Kelly Napoleon, MD 6/13/20188:27 AM

## 2017-03-06 ENCOUNTER — Other Ambulatory Visit: Payer: Self-pay | Admitting: Gastroenterology

## 2017-03-06 DIAGNOSIS — R935 Abnormal findings on diagnostic imaging of other abdominal regions, including retroperitoneum: Secondary | ICD-10-CM

## 2017-03-08 ENCOUNTER — Ambulatory Visit: Payer: BLUE CROSS/BLUE SHIELD

## 2017-03-08 ENCOUNTER — Telehealth: Payer: Self-pay | Admitting: *Deleted

## 2017-03-08 ENCOUNTER — Other Ambulatory Visit (HOSPITAL_BASED_OUTPATIENT_CLINIC_OR_DEPARTMENT_OTHER): Payer: BLUE CROSS/BLUE SHIELD

## 2017-03-08 ENCOUNTER — Ambulatory Visit (HOSPITAL_BASED_OUTPATIENT_CLINIC_OR_DEPARTMENT_OTHER): Payer: BLUE CROSS/BLUE SHIELD | Admitting: Hematology & Oncology

## 2017-03-08 VITALS — BP 179/70 | HR 73 | Temp 98.5°F | Resp 20 | Wt 116.8 lb

## 2017-03-08 DIAGNOSIS — D631 Anemia in chronic kidney disease: Secondary | ICD-10-CM | POA: Diagnosis not present

## 2017-03-08 DIAGNOSIS — D500 Iron deficiency anemia secondary to blood loss (chronic): Secondary | ICD-10-CM | POA: Diagnosis not present

## 2017-03-08 DIAGNOSIS — N183 Chronic kidney disease, stage 3 unspecified: Secondary | ICD-10-CM

## 2017-03-08 DIAGNOSIS — N182 Chronic kidney disease, stage 2 (mild): Secondary | ICD-10-CM

## 2017-03-08 LAB — CBC WITH DIFFERENTIAL (CANCER CENTER ONLY)
BASO#: 0 10*3/uL (ref 0.0–0.2)
BASO%: 0.5 % (ref 0.0–2.0)
EOS%: 1.4 % (ref 0.0–7.0)
Eosinophils Absolute: 0.1 10*3/uL (ref 0.0–0.5)
HCT: 37.1 % (ref 34.8–46.6)
HGB: 11.8 g/dL (ref 11.6–15.9)
LYMPH#: 2 10*3/uL (ref 0.9–3.3)
LYMPH%: 25.4 % (ref 14.0–48.0)
MCH: 29.9 pg (ref 26.0–34.0)
MCHC: 31.8 g/dL — ABNORMAL LOW (ref 32.0–36.0)
MCV: 94 fL (ref 81–101)
MONO#: 0.7 10*3/uL (ref 0.1–0.9)
MONO%: 8.5 % (ref 0.0–13.0)
NEUT#: 4.9 10*3/uL (ref 1.5–6.5)
NEUT%: 64.2 % (ref 39.6–80.0)
Platelets: 217 10*3/uL (ref 145–400)
RBC: 3.95 10*6/uL (ref 3.70–5.32)
RDW: 12.7 % (ref 11.1–15.7)
WBC: 7.7 10*3/uL (ref 3.9–10.0)

## 2017-03-08 LAB — IRON AND TIBC
%SAT: 47 % (ref 21–57)
Iron: 118 ug/dL (ref 41–142)
TIBC: 249 ug/dL (ref 236–444)
UIBC: 131 ug/dL (ref 120–384)

## 2017-03-08 LAB — CMP (CANCER CENTER ONLY)
ALT(SGPT): 33 U/L (ref 10–47)
AST: 31 U/L (ref 11–38)
Albumin: 3.4 g/dL (ref 3.3–5.5)
Alkaline Phosphatase: 49 U/L (ref 26–84)
BUN, Bld: 37 mg/dL — ABNORMAL HIGH (ref 7–22)
CO2: 26 mEq/L (ref 18–33)
Calcium: 9.3 mg/dL (ref 8.0–10.3)
Chloride: 106 mEq/L (ref 98–108)
Creat: 1.4 mg/dl — ABNORMAL HIGH (ref 0.6–1.2)
Glucose, Bld: 202 mg/dL — ABNORMAL HIGH (ref 73–118)
Potassium: 5.1 mEq/L — ABNORMAL HIGH (ref 3.3–4.7)
Sodium: 143 mEq/L (ref 128–145)
Total Bilirubin: 0.4 mg/dl (ref 0.20–1.60)
Total Protein: 6.9 g/dL (ref 6.4–8.1)

## 2017-03-08 LAB — FERRITIN: Ferritin: 473 ng/ml — ABNORMAL HIGH (ref 9–269)

## 2017-03-08 NOTE — Telephone Encounter (Addendum)
Patient is aware of results  ----- Message from Volanda Napoleon, MD sent at 03/08/2017 12:58 PM EDT ----- Call - iron level is fantastic!!!  pete

## 2017-03-08 NOTE — Progress Notes (Signed)
Hematology and Oncology Follow Up Visit  Kelly Soto [id number] 1956/02/01 61 y.o. 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 She is doing quite well. She is not having any abdominal pain. She thinks that this might be from the Creon and also from the probiotic that she takes.  She is watching her blood sugars. She says her last hemoglobin A1c was 6.3.  She's had no bleeding.  There's been no nausea or vomiting.  She's had no rashes.  She's had no fever.  The last time that we last saw her in June, her iron studies showed a ferritin of 524 with an iron saturation of 33%.  Overall, her performance status is ECOG 1.    Medications:  Current Outpatient Prescriptions:  .  acetaminophen (TYLENOL) 500 MG chewable tablet, Chew 500-1,000 mg by mouth every 6 (six) hours as needed for pain (depends on pain level)., Disp: , Rfl:  .  baclofen (LIORESAL) 10 MG tablet, Take 10 mg by mouth daily as needed. migraines, Disp: , Rfl:  .  denosumab (PROLIA) 60 MG/ML SOLN injection, Inject 60 mg into the skin every 6 (six) months. Administer in upper arm, thigh, or abdomen  Next injection is due 05-2016, Disp: , Rfl:  .  diazepam (VALIUM) 5 MG tablet, Take 5 mg by mouth daily as needed for anxiety. , Disp: , Rfl: 0 .  Empagliflozin-Linagliptin (GLYXAMBI PO), Take by mouth., Disp: , Rfl:  .  fluticasone (FLONASE) 50 MCG/ACT nasal spray, Place 2 sprays into both nostrils at bedtime., Disp: , Rfl:  .  glyBURIDE-metformin (GLUCOVANCE) 2.5-500 MG per tablet, Take 2 tablets by mouth 2 (two) times daily., Disp: , Rfl:  .  hydroxypropyl methylcellulose / hypromellose (ISOPTO TEARS / GONIOVISC) 2.5 % ophthalmic  solution, Place 1 drop into both eyes 3 (three) times daily., Disp: , Rfl:  .  meclizine (ANTIVERT) 25 MG tablet, TAKE 1 TABLET BY MOUTH 3 TIMES A DAY AS NEEDED FOR VERTIGO, Disp: , Rfl: 0 .  metoprolol succinate (TOPROL-XL) 25 MG 24 hr tablet, Take 25 mg by mouth every morning., Disp: , Rfl:  .  omeprazole (PRILOSEC) 40 MG capsule, Take 40 mg by mouth 2 (two) times daily., Disp: , Rfl:  .  Pancrelipase, Lip-Prot-Amyl, 24000-76000 units CPEP, Take 1 capsule (24,000 Units total) by mouth 3 (three) times daily before meals., Disp: 180 capsule, Rfl: 4 .  promethazine (PHENERGAN) 25 MG tablet, Take 25-50 mg by mouth every 8 (eight) hours as needed (for migraine induced nausea)., Disp: , Rfl:  .  topiramate (TOPAMAX) 100 MG tablet, Take 100 mg by mouth daily., Disp: , Rfl: 5 .  doxycycline (VIBRA-TABS) 100 MG tablet, Take 100 mg by mouth., Disp: , Rfl:   Allergies:  Allergies  Allergen Reactions  . Aspirin Other (See Comments)    Cannot take due to gastric bypass surgery  . Nsaids Anaphylaxis  . Ace Inhibitors Other (See Comments)    Angioedema  . Sular [Nisoldipine Er] Other (See Comments)    angioedema  . Boniva [Ibandronic Acid] Other (See Comments)    Joint pain    Past Medical History, Surgical history, Social history, and Family History were reviewed and updated.  Review of Systems: As stated in the interim history  Physical Exam:  weight is 116 lb 12 oz (53 kg). Her oral temperature is 98.5 F (36.9 C). Her blood pressure is 179/70 (abnormal) and her pulse is 73. Her respiration is 20 and oxygen saturation is 100%.   Physical Exam  Constitutional: She is oriented to person, place, and time.  HENT:  Head: Normocephalic and atraumatic.  Mouth/Throat: Oropharynx is clear and moist.  Eyes: Pupils are equal, round, and reactive to light. EOM are normal.  Neck: Normal range of motion.  Cardiovascular: Normal rate, regular rhythm and normal heart sounds.   Pulmonary/Chest: Effort  normal and breath sounds normal.  Abdominal: Soft. Bowel sounds are normal.  Musculoskeletal: Normal range of motion. She exhibits no edema, tenderness or deformity.  Lymphadenopathy:    She has no cervical adenopathy.  Neurological: She is alert and oriented to person, place, and time.  Skin: Skin is warm and dry. No rash noted. No erythema.  Psychiatric: She has a normal mood and affect. Her behavior is normal. Judgment and thought content normal.  Vitals reviewed.    Lab Results  Component Value Date   WBC 7.7 03/08/2017   HGB 11.8 03/08/2017   HCT 37.1 03/08/2017   MCV 94 03/08/2017   PLT 217 03/08/2017     Chemistry      Component Value Date/Time   NA 137 12/06/2016 0746   K 5.2 (H) 12/06/2016 0746   CL 108 08/02/2016 0745   CO2 23 12/06/2016 0746   BUN 26.1 (H) 12/06/2016 0746   CREATININE 1.2 (H) 12/06/2016 0746      Component Value Date/Time   CALCIUM 9.1 12/06/2016 0746   ALKPHOS 41 12/06/2016 0746   AST 23 12/06/2016 0746   ALT 26 12/06/2016 0746   BILITOT 0.26 12/06/2016 0746         Impression and Plan: Kelly Soto is 61 year old white female. She has diabetes. She has iron deficiency anemia. She has a very low erythropoietin level.  This is the best her blood has been in a while. I'm just have her that we do not had to give her any Aranesp.  I would doubt that her iron is low.  Right now, we will get her back in 2 months. I want to see her back before the holidays. I want to make sure that she is okay with her blood counts.  As always, she is incredibly active. Hopefully, this will help her diabetes.  I taught her about her creatinine and kidney function. I told her that she really has to watch out with her kidney function. She does not want to have kidney issues with diabetes because this will likely end up with dialysis.  She appreciates my concern and will certainly do her best to stay well-hydrated.  Volanda Napoleon, MD 9/13/20188:09 AM

## 2017-03-16 ENCOUNTER — Inpatient Hospital Stay
Admission: RE | Admit: 2017-03-16 | Discharge: 2017-03-16 | Disposition: A | Discharge status: Home / Self Care | Payer: BLUE CROSS/BLUE SHIELD | Source: Ambulatory Visit | Attending: Gastroenterology | Admitting: Gastroenterology

## 2017-03-19 ENCOUNTER — Other Ambulatory Visit: Payer: Self-pay | Admitting: Family Medicine

## 2017-03-19 DIAGNOSIS — N644 Mastodynia: Secondary | ICD-10-CM

## 2017-03-19 DIAGNOSIS — N6452 Nipple discharge: Secondary | ICD-10-CM

## 2017-03-23 ENCOUNTER — Ambulatory Visit
Admission: RE | Admit: 2017-03-23 | Discharge: 2017-03-23 | Disposition: A | Discharge status: Home / Self Care | Payer: BLUE CROSS/BLUE SHIELD | Source: Ambulatory Visit | Attending: Family Medicine | Admitting: Family Medicine

## 2017-03-23 ENCOUNTER — Other Ambulatory Visit: Payer: Self-pay | Admitting: Family Medicine

## 2017-03-23 DIAGNOSIS — N6452 Nipple discharge: Secondary | ICD-10-CM

## 2017-03-23 DIAGNOSIS — N644 Mastodynia: Secondary | ICD-10-CM

## 2017-03-23 DIAGNOSIS — N6320 Unspecified lump in the left breast, unspecified quadrant: Secondary | ICD-10-CM

## 2017-03-26 ENCOUNTER — Ambulatory Visit
Admission: RE | Admit: 2017-03-26 | Discharge: 2017-03-26 | Disposition: A | Discharge status: Home / Self Care | Payer: BLUE CROSS/BLUE SHIELD | Source: Ambulatory Visit | Attending: Family Medicine | Admitting: Family Medicine

## 2017-03-26 ENCOUNTER — Other Ambulatory Visit: Payer: Self-pay | Admitting: Family Medicine

## 2017-03-26 DIAGNOSIS — N6320 Unspecified lump in the left breast, unspecified quadrant: Secondary | ICD-10-CM

## 2017-03-26 HISTORY — PX: BREAST BIOPSY: SHX20

## 2017-03-29 ENCOUNTER — Other Ambulatory Visit: Payer: BLUE CROSS/BLUE SHIELD

## 2017-03-29 ENCOUNTER — Ambulatory Visit
Admission: RE | Admit: 2017-03-29 | Discharge: 2017-03-29 | Disposition: A | Discharge status: Home / Self Care | Payer: BLUE CROSS/BLUE SHIELD | Source: Ambulatory Visit | Attending: Gastroenterology | Admitting: Gastroenterology

## 2017-03-29 DIAGNOSIS — R935 Abnormal findings on diagnostic imaging of other abdominal regions, including retroperitoneum: Secondary | ICD-10-CM

## 2017-03-29 MED ORDER — GADOBENATE DIMEGLUMINE 529 MG/ML IV SOLN
6.0000 mL | Freq: Once | INTRAVENOUS | Status: AC | PRN
  Administered 2017-03-29: 6 mL via INTRAVENOUS

## 2017-04-10 ENCOUNTER — Other Ambulatory Visit: Payer: Self-pay | Admitting: Surgery

## 2017-04-10 DIAGNOSIS — N6452 Nipple discharge: Secondary | ICD-10-CM

## 2017-04-19 IMAGING — RF DG UGI W/ SMALL BOWEL
1 series · 15 of 22 positions shown · non-contrast
Comparison: CT abdomen pelvis 07/10/2016.

CLINICAL DATA: Generalized abdominal pain/epigastric pain.

EXAM:
UPPER GI SERIES WITH SMALL BOWEL FOLLOW-THROUGH
FLUOROSCOPY TIME:  Fluoroscopy Time:  2 minutes 12 seconds
Radiation Exposure Index (if provided by the fluoroscopic device):
338 mGy
Number of Acquired Spot Images: 22
TECHNIQUE: Combined double contrast and single contrast upper GI series using
effervescent crystals, thick barium, and thin barium. Subsequently,
serial images of the small bowel were obtained including spot views
of the terminal ileum.

[Series 1: one shot · 15 of 22 slices shown]
[im 1/22]
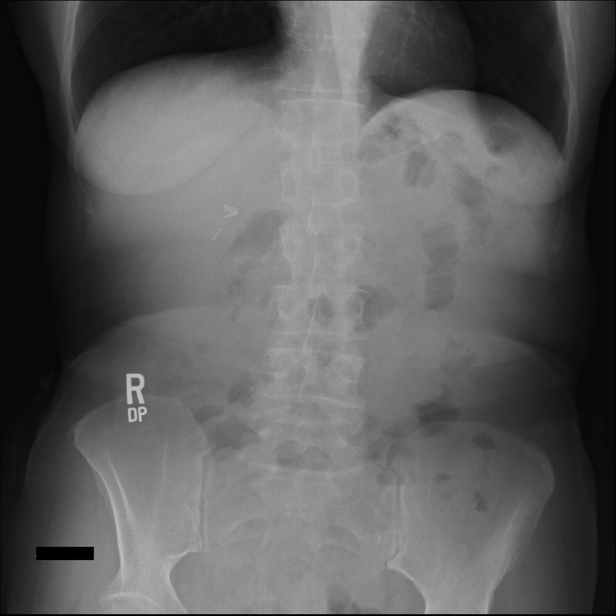
[im 3/22]
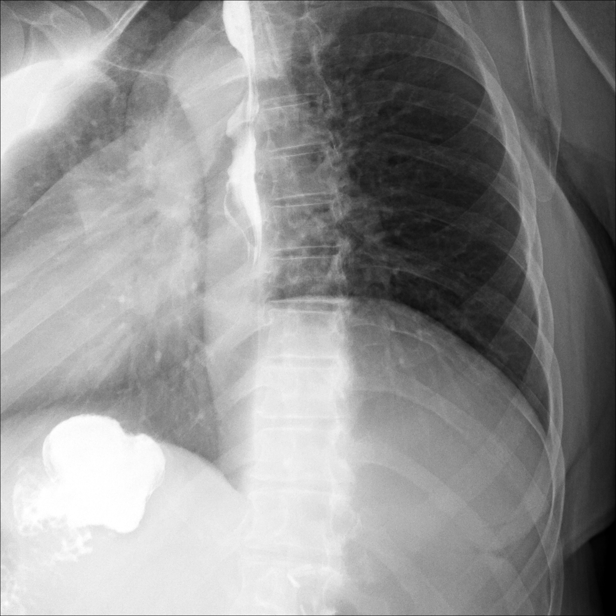
[im 4/22]
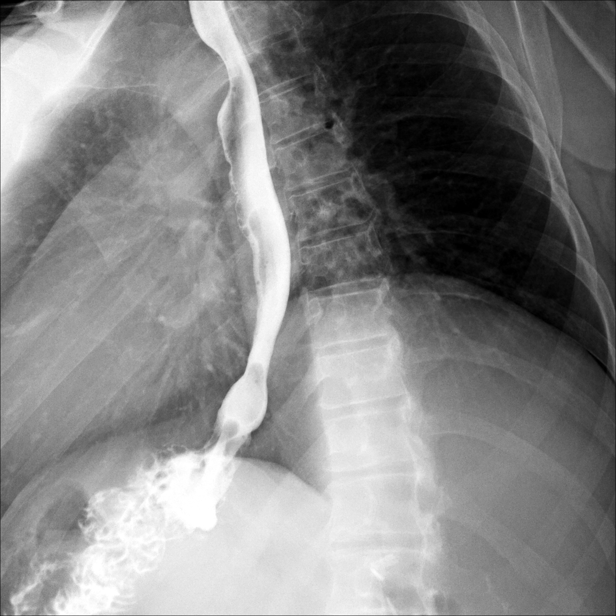
[im 6/22]
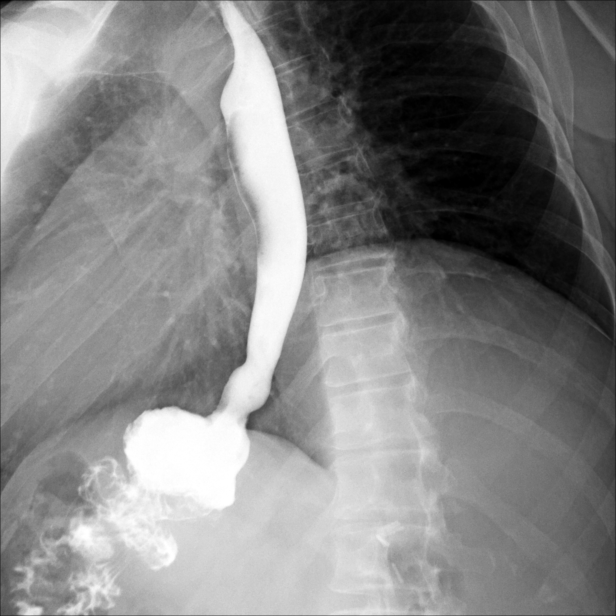
[im 7/22]
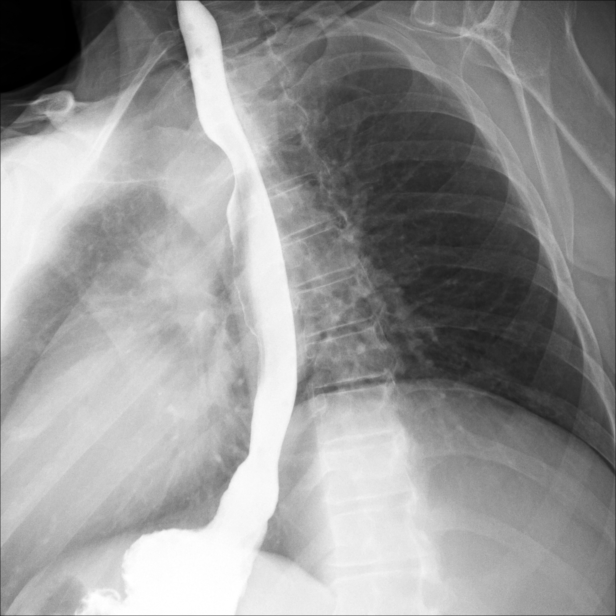
[im 9/22]
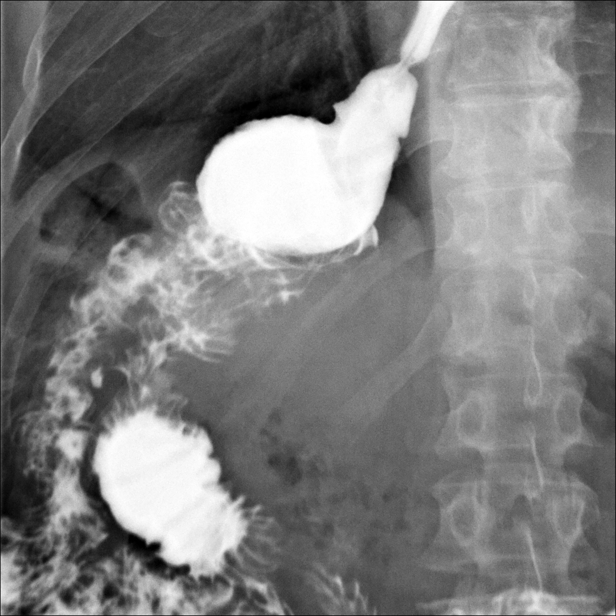
[im 10/22]
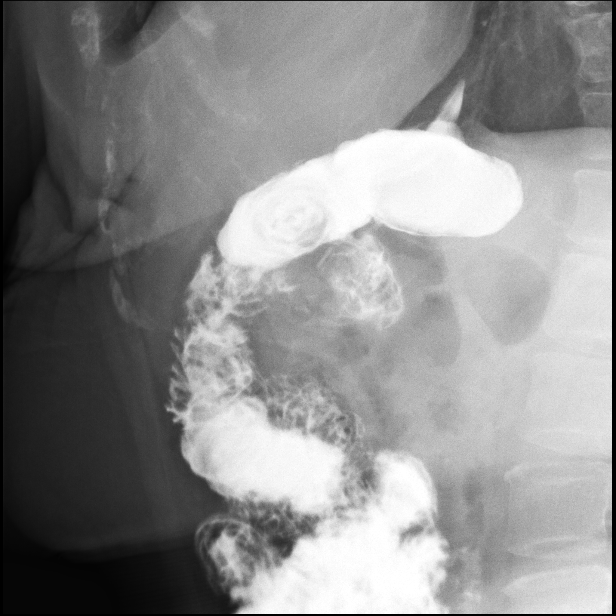
[im 12/22]
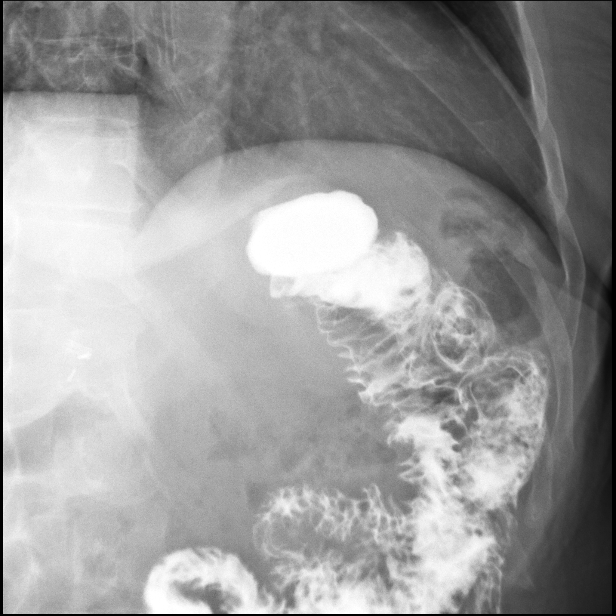
[im 13/22]
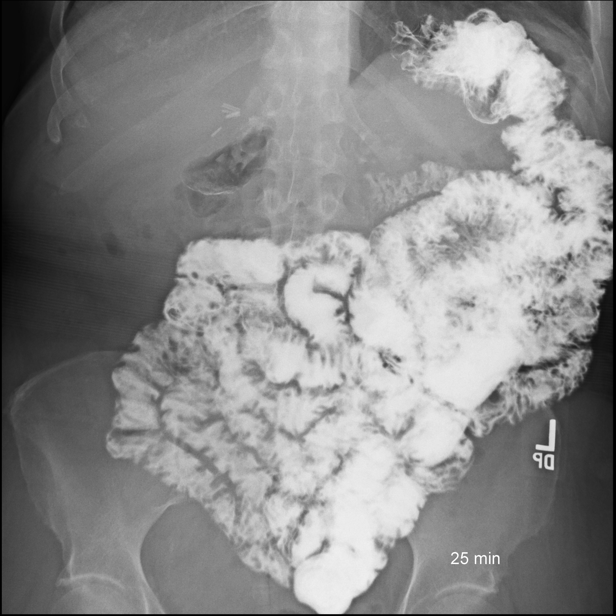
[im 14/22]
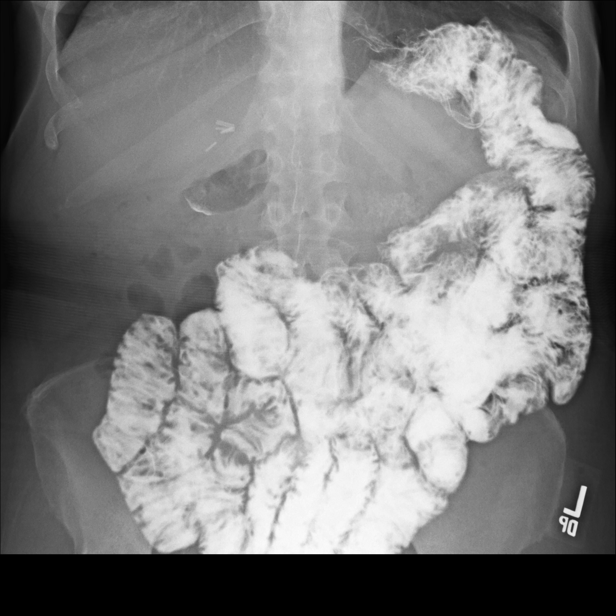
[im 16/22]
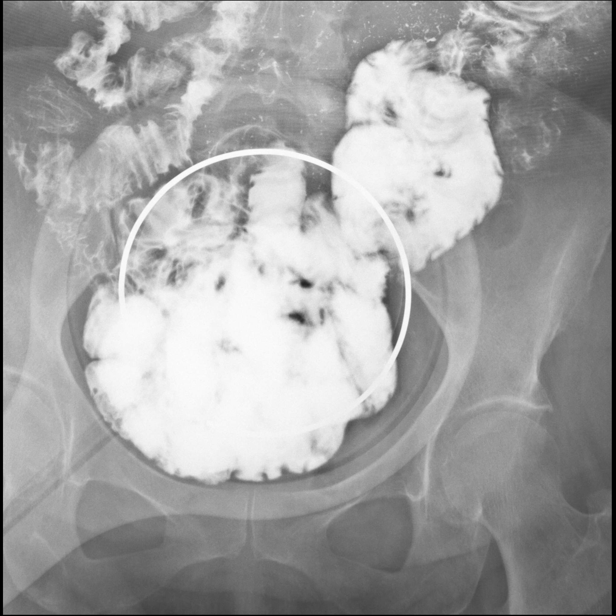
[im 17/22]
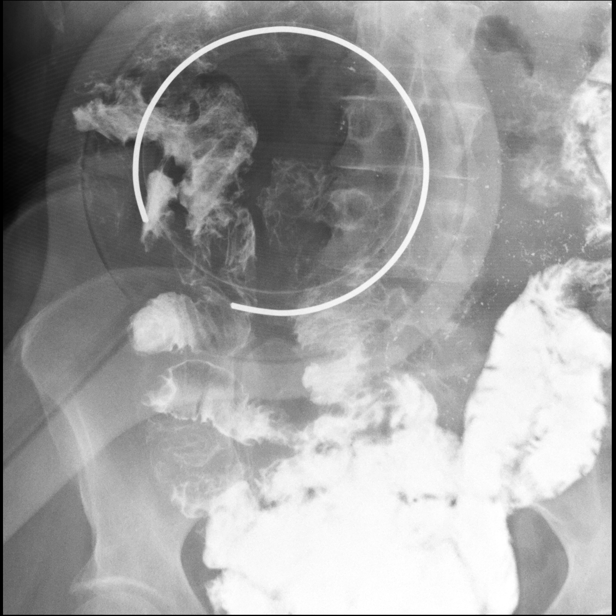
[im 19/22]
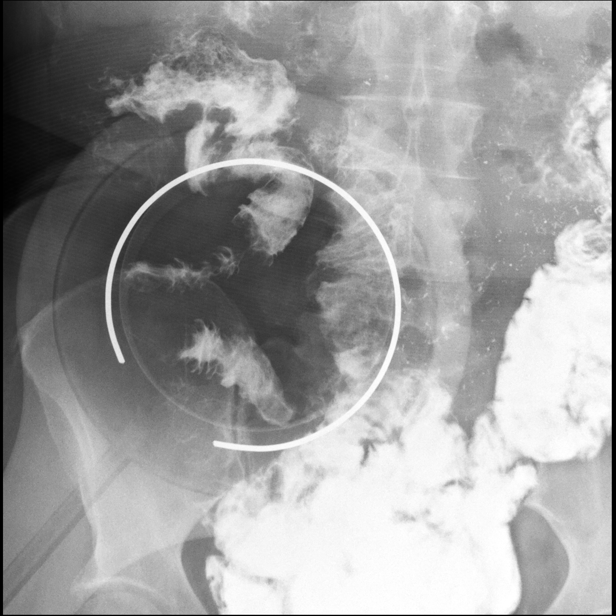
[im 20/22]
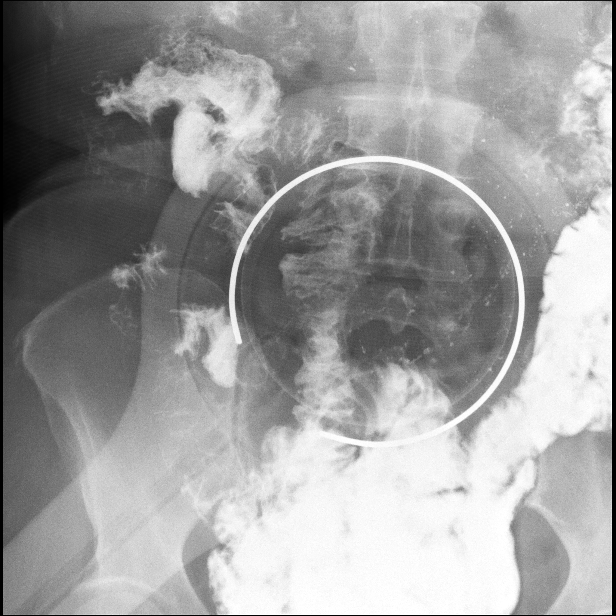
[im 22/22]
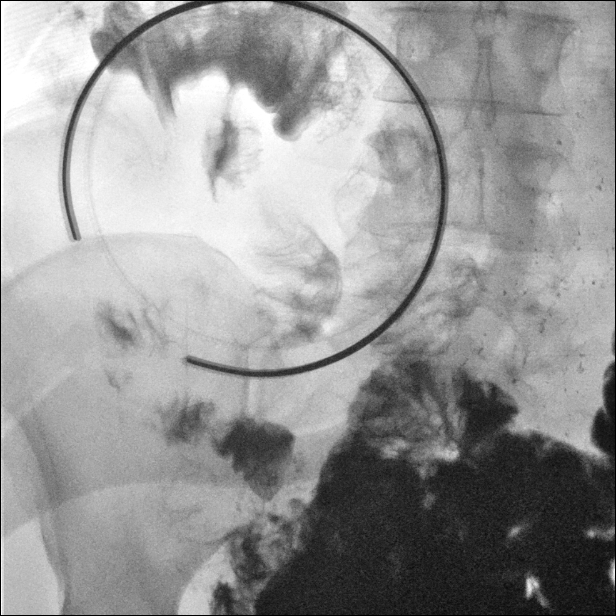

[15 of 22 positions shown; findings below may reference images not displayed]

FINDINGS: Scout view of the abdomen shows a normal bowel gas pattern. Surgical
clips in the right upper quadrant. Lung bases are clear.

Single contrast examination of the upper gastrointestinal tract
shows occasionally sluggish esophageal motility. No esophageal fold
thickening, stricture or obstruction. Tiny hiatal hernia. Contrast
flows readily through the gastric pouch into the small bowel. Small
bowel fold pattern is within normal limits. No dilatation. Terminal
ileum is grossly within normal limits.
IMPRESSION: 1. Postoperative changes of gastric bypass without complicating
feature.
2. Tiny hiatal hernia.

## 2017-04-23 ENCOUNTER — Encounter (INDEPENDENT_AMBULATORY_CARE_PROVIDER_SITE_OTHER): Payer: Self-pay | Admitting: Orthopedic Surgery

## 2017-04-23 ENCOUNTER — Ambulatory Visit (INDEPENDENT_AMBULATORY_CARE_PROVIDER_SITE_OTHER): Payer: BLUE CROSS/BLUE SHIELD | Admitting: Orthopedic Surgery

## 2017-04-23 DIAGNOSIS — M65331 Trigger finger, right middle finger: Secondary | ICD-10-CM | POA: Diagnosis not present

## 2017-04-24 NOTE — Progress Notes (Signed)
Office Visit Note   Patient: Kelly Soto           Date of Birth: March 24, 1956           MRN: [id number] Visit Date: 04/23/2017 Requested by: [name], MD [address] [place], [place] [address] PCP: [name], MD  Subjective: Chief Complaint  Patient presents with  . Right Hand - Pain    HPI: Kelly Soto is a 61 year old patient with right hand pain.  She reports 2 month history of right middle finger locking.  Triggering every day.  She's had similar issue with left hand which was treated successfully with surgery.  She is right-hand dominant.  She would like immediate surgical correction.              ROS: All systems reviewed are negative as they relate to the chief complaint within the history of present illness.  Patient denies  fevers or chills.   Assessment & Plan: Visit Diagnoses:  1. Trigger finger, right middle finger     Plan: Impression is daily triggering right hand middle finger.  She is tender over the A1 pulley third finger.  Been going on for 2 months.  We discussed injection versus surgical treatment versus observation.  Patient elects for surgical correction.  She's been through this on the left hand.  Risks and benefits are discussed including not limited to infection or vessel damage as well as incomplete healing and potential need for more surgery.  All questions answered  Follow-Up Instructions: No Follow-up on file.   Orders:  No orders of the defined types were placed in this encounter.  No orders of the defined types were placed in this encounter.     Procedures: No procedures performed   Clinical Data: No additional findings.  Objective: Vital Signs: There were no vitals taken for this visit.  Physical Exam:   Constitutional: Patient appears well-developed HEENT:  Head: Normocephalic Eyes:EOM are normal Neck: Normal range of motion Cardiovascular: Normal rate Pulmonary/chest: Effort normal Neurologic:  Patient is alert Skin: Skin is warm Psychiatric: Patient has normal mood and affect    Ortho Exam: Orthopedic exam demonstrates full active and passive range of motion of the wrists on the right hand.  She does have tenderness at the A1 pulley base of the middle finger.  No other masses lymph adenopathy or skin changes noted in the right hand region.  She does have triggering of the right middle finger.  Specialty Comments:  No specialty comments available.  Imaging: No results found.   PMFS History: Patient Active Problem List   Diagnosis Date Noted  . Chronic renal insufficiency 09/24/2013  . Anemia of renal disease 09/24/2013  . H/O gastric bypass 08/22/2013  . Intestinal malabsorption 08/22/2013  . Anemia, iron deficiency 08/22/2013  . Chronic eustachian tube dysfunction 03/28/2011  . Diabetes mellitus ([place]) 03/28/2011   Past Medical History:  Diagnosis Date  . Anemia of renal disease 09/24/2013  . Chronic renal insufficiency 09/24/2013  . Diabetes mellitus without complication ([place])   . Headache    migrains  . History of hiatal hernia   . Hypertension   . PONV (postoperative nausea and vomiting)   . Ulcer     Family History  Problem Relation Age of Onset  . Atrial fibrillation Mother   . Heart attack Father   . Sudden Cardiac Death Father   . Stroke Maternal Grandmother     Past Surgical History:  Procedure Laterality Date  .  ABDOMINAL HYSTERECTOMY  1992  . CARPAL TUNNEL RELEASE Bilateral   . CHOLECYSTECTOMY N/A 05/11/2016   Procedure: LAPAROSCOPIC CHOLECYSTECTOMY;  Surgeon: Erroll Luna, MD;  Location: [facility];  Service: General;  Laterality: N/A;  . GASTRIC BYPASS  2009   Social History   Occupational History  . Not on file.   Social History Main Topics  . Smoking status: Never Smoker  . Smokeless tobacco: Never Used     Comment: never used tobacco  . Alcohol use No  . Drug use: No  . Sexual activity: Yes

## 2017-04-26 ENCOUNTER — Ambulatory Visit
Admission: RE | Admit: 2017-04-26 | Discharge: 2017-04-26 | Disposition: A | Discharge status: Home / Self Care | Payer: BLUE CROSS/BLUE SHIELD | Source: Ambulatory Visit | Attending: Surgery | Admitting: Surgery

## 2017-04-26 DIAGNOSIS — N6452 Nipple discharge: Secondary | ICD-10-CM

## 2017-04-26 MED ORDER — GADOBENATE DIMEGLUMINE 529 MG/ML IV SOLN
10.0000 mL | Freq: Once | INTRAVENOUS | Status: AC | PRN
  Administered 2017-04-26: 10 mL via INTRAVENOUS

## 2017-04-30 ENCOUNTER — Telehealth (INDEPENDENT_AMBULATORY_CARE_PROVIDER_SITE_OTHER): Payer: Self-pay | Admitting: Orthopedic Surgery

## 2017-04-30 NOTE — Telephone Encounter (Signed)
Patient called 2x with no response, she is wanting to set up her surgery.  She is trying to get this set up so that she can get her out of town appointments taken care of.  Please call patient.  Thank you.

## 2017-04-30 NOTE — Telephone Encounter (Signed)
Patient called asked for a call back to schedule her surgery with Dr Marlou Sa. The number to contact patient is [phone]

## 2017-05-03 NOTE — Telephone Encounter (Signed)
Spoke with patient Tuesday and scheduled surgery.

## 2017-05-10 ENCOUNTER — Ambulatory Visit: Payer: BLUE CROSS/BLUE SHIELD | Admitting: Hematology & Oncology

## 2017-05-10 ENCOUNTER — Encounter: Payer: Self-pay | Admitting: Hematology & Oncology

## 2017-05-10 ENCOUNTER — Ambulatory Visit (HOSPITAL_BASED_OUTPATIENT_CLINIC_OR_DEPARTMENT_OTHER): Payer: BLUE CROSS/BLUE SHIELD | Admitting: Hematology & Oncology

## 2017-05-10 ENCOUNTER — Other Ambulatory Visit (HOSPITAL_BASED_OUTPATIENT_CLINIC_OR_DEPARTMENT_OTHER): Payer: BLUE CROSS/BLUE SHIELD

## 2017-05-10 ENCOUNTER — Telehealth: Payer: Self-pay | Admitting: *Deleted

## 2017-05-10 ENCOUNTER — Ambulatory Visit (HOSPITAL_BASED_OUTPATIENT_CLINIC_OR_DEPARTMENT_OTHER): Payer: BLUE CROSS/BLUE SHIELD

## 2017-05-10 ENCOUNTER — Other Ambulatory Visit: Payer: Self-pay

## 2017-05-10 VITALS — BP 163/68 | HR 81 | Temp 98.3°F | Resp 19 | Wt 119.0 lb

## 2017-05-10 DIAGNOSIS — N189 Chronic kidney disease, unspecified: Secondary | ICD-10-CM

## 2017-05-10 DIAGNOSIS — D509 Iron deficiency anemia, unspecified: Secondary | ICD-10-CM | POA: Diagnosis not present

## 2017-05-10 DIAGNOSIS — N183 Chronic kidney disease, stage 3 unspecified: Secondary | ICD-10-CM

## 2017-05-10 DIAGNOSIS — D631 Anemia in chronic kidney disease: Secondary | ICD-10-CM | POA: Diagnosis not present

## 2017-05-10 DIAGNOSIS — E119 Type 2 diabetes mellitus without complications: Secondary | ICD-10-CM

## 2017-05-10 DIAGNOSIS — D500 Iron deficiency anemia secondary to blood loss (chronic): Secondary | ICD-10-CM

## 2017-05-10 LAB — CMP (CANCER CENTER ONLY)
ALT(SGPT): 31 U/L (ref 10–47)
AST: 23 U/L (ref 11–38)
Albumin: 3.4 g/dL (ref 3.3–5.5)
Alkaline Phosphatase: 61 U/L (ref 26–84)
BUN, Bld: 28 mg/dL — ABNORMAL HIGH (ref 7–22)
CO2: 23 mEq/L (ref 18–33)
Calcium: 9.1 mg/dL (ref 8.0–10.3)
Chloride: 110 mEq/L — ABNORMAL HIGH (ref 98–108)
Creat: 1.2 mg/dl (ref 0.6–1.2)
Glucose, Bld: 190 mg/dL — ABNORMAL HIGH (ref 73–118)
Potassium: 4.7 mEq/L (ref 3.3–4.7)
Sodium: 145 mEq/L (ref 128–145)
Total Bilirubin: 0.5 mg/dl (ref 0.20–1.60)
Total Protein: 6.5 g/dL (ref 6.4–8.1)

## 2017-05-10 LAB — IRON AND TIBC
%SAT: 31 % (ref 21–57)
Iron: 80 ug/dL (ref 41–142)
TIBC: 260 ug/dL (ref 236–444)
UIBC: 180 ug/dL (ref 120–384)

## 2017-05-10 LAB — CBC WITH DIFFERENTIAL (CANCER CENTER ONLY)
BASO#: 0 10*3/uL (ref 0.0–0.2)
BASO%: 0.6 % (ref 0.0–2.0)
EOS%: 1.7 % (ref 0.0–7.0)
Eosinophils Absolute: 0.1 10*3/uL (ref 0.0–0.5)
HCT: 28.9 % — ABNORMAL LOW (ref 34.8–46.6)
HGB: 9.1 g/dL — ABNORMAL LOW (ref 11.6–15.9)
LYMPH#: 1.5 10*3/uL (ref 0.9–3.3)
LYMPH%: 23.3 % (ref 14.0–48.0)
MCH: 29.6 pg (ref 26.0–34.0)
MCHC: 31.5 g/dL — ABNORMAL LOW (ref 32.0–36.0)
MCV: 94 fL (ref 81–101)
MONO#: 0.5 10*3/uL (ref 0.1–0.9)
MONO%: 7.4 % (ref 0.0–13.0)
NEUT#: 4.3 10*3/uL (ref 1.5–6.5)
NEUT%: 67 % (ref 39.6–80.0)
Platelets: 231 10*3/uL (ref 145–400)
RBC: 3.07 10*6/uL — ABNORMAL LOW (ref 3.70–5.32)
RDW: 15 % (ref 11.1–15.7)
WBC: 6.5 10*3/uL (ref 3.9–10.0)

## 2017-05-10 LAB — RETICULOCYTES: Reticulocyte Count: 2.2 % (ref 0.6–2.6)

## 2017-05-10 LAB — FERRITIN: Ferritin: 507 ng/ml — ABNORMAL HIGH (ref 9–269)

## 2017-05-10 MED ORDER — DARBEPOETIN ALFA 300 MCG/0.6ML IJ SOSY
PREFILLED_SYRINGE | INTRAMUSCULAR | Status: AC
  Filled 2017-05-10: qty 0.6

## 2017-05-10 MED ORDER — DARBEPOETIN ALFA 300 MCG/0.6ML IJ SOSY
300.0000 ug | PREFILLED_SYRINGE | Freq: Once | INTRAMUSCULAR | Status: AC
  Administered 2017-05-10: 300 ug via SUBCUTANEOUS

## 2017-05-10 NOTE — Patient Instructions (Signed)

## 2017-05-10 NOTE — Telephone Encounter (Addendum)
Message left on personal voice mail.   ----- Message from Volanda Napoleon, MD sent at 05/10/2017 12:52 PM EST ----- Call - iron is ok!!  Laurey Arrow

## 2017-05-10 NOTE — Progress Notes (Signed)
Hematology and Oncology Follow Up Visit  KENDREA CERRITOS [id number] [birth year] 61 y.o. 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  Moga is back for follow-up.  She is feeling much better.  She says that the reason she is feeling better is that she is now taking a raw probiotic.  She gets this from a local grocery store.  She says when she started taking this, she began to feel much better.  Her blood sugars have been up and down.  She says her last hemoglobin A1c was over 8.  She has had no bleeding.  There is been no nausea or vomiting.  She has had no cough.  Is been no fever.  We will last saw her, her iron studies looked great.  Her ferritin was 473 with an iron saturation of 47%.  She already has her Christmas tree up.  She really is looking forward to the Christmas season.  She is also going to be quite busy for Thanksgiving.   Overall, her performance status is ECOG 1.    Medications:  Current Outpatient Medications:  .  acetaminophen (TYLENOL) 500 MG chewable tablet, Chew 500-1,000 mg by mouth every 6 (six) hours as needed for pain (depends on pain level)., Disp: , Rfl:  .  baclofen (LIORESAL) 10 MG tablet, Take 10 mg by mouth daily as needed. migraines, Disp: , Rfl:  .  denosumab (PROLIA) 60 MG/ML SOLN injection, Inject 60 mg into the skin every 6 (six) months. Administer in upper arm, thigh, or abdomen  Next injection is due 05-2016, Disp: , Rfl:  .  diazepam (VALIUM) 5 MG tablet, Take 5 mg by mouth daily as needed for anxiety. , Disp: , Rfl: 0 .  doxycycline (VIBRA-TABS) 100 MG tablet, Take 100 mg by mouth., Disp: , Rfl:  .  Empagliflozin-Linagliptin (GLYXAMBI PO), Take by mouth., Disp:  , Rfl:  .  fluticasone (FLONASE) 50 MCG/ACT nasal spray, Place 2 sprays into both nostrils at bedtime., Disp: , Rfl:  .  glyBURIDE-metformin (GLUCOVANCE) 2.5-500 MG per tablet, Take 2 tablets by mouth 2 (two) times daily., Disp: , Rfl:  .  GLYXAMBI 25-5 MG TABS, Take 1 tablet by mouth every morning., Disp: , Rfl: 3 .  hydroxypropyl methylcellulose / hypromellose (ISOPTO TEARS / GONIOVISC) 2.5 % ophthalmic solution, Place 1 drop into both eyes 3 (three) times daily., Disp: , Rfl:  .  meclizine (ANTIVERT) 25 MG tablet, TAKE 1 TABLET BY MOUTH 3 TIMES A DAY AS NEEDED FOR VERTIGO, Disp: , Rfl: 0 .  metoprolol succinate (TOPROL-XL) 25 MG 24 hr tablet, Take 25 mg by mouth every morning., Disp: , Rfl:  .  omeprazole (PRILOSEC) 40 MG capsule, Take 40 mg by mouth 2 (two) times daily., Disp: , Rfl:  .  ONE TOUCH ULTRA TEST test strip, TEST BLOOD SUGAR ONCE DAILY, Disp: , Rfl: 3 .  ONETOUCH DELICA LANCETS 89F MISC, TEST BLOOD SUGAR ONCE DAILY, Disp: , Rfl: 3 .  Pancrelipase, Lip-Prot-Amyl, 24000-76000 units CPEP, Take 1 capsule (24,000 Units total) by mouth 3 (three) times daily before meals., Disp: 180 capsule, Rfl: 4 .  promethazine (PHENERGAN) 25 MG tablet, Take 25-50 mg by mouth every 8 (eight) hours  as needed (for migraine induced nausea)., Disp: , Rfl:  .  topiramate (TOPAMAX) 100 MG tablet, Take 100 mg by mouth daily., Disp: , Rfl: 5  Allergies:  Allergies  Allergen Reactions  . Aspirin Other (See Comments)    Cannot take due to gastric bypass surgery  . Nsaids Anaphylaxis  . Ace Inhibitors Other (See Comments)    Angioedema  . Sular [Nisoldipine Er] Other (See Comments)    angioedema  . Boniva [Ibandronic Acid] Other (See Comments)    Joint pain    Past Medical History, Surgical history, Social history, and Family History were reviewed and updated.  Review of Systems: As stated in the interim history  Physical Exam:  vitals were not taken for this visit.   Physical Exam   Constitutional: She is oriented to person, place, and time.  HENT:  Head: Normocephalic and atraumatic.  Mouth/Throat: Oropharynx is clear and moist.  Eyes: EOM are normal. Pupils are equal, round, and reactive to light.  Neck: Normal range of motion.  Cardiovascular: Normal rate, regular rhythm and normal heart sounds.  Pulmonary/Chest: Effort normal and breath sounds normal.  Abdominal: Soft. Bowel sounds are normal.  Musculoskeletal: Normal range of motion. She exhibits no edema, tenderness or deformity.  Lymphadenopathy:    She has no cervical adenopathy.  Neurological: She is alert and oriented to person, place, and time.  Skin: Skin is warm and dry. No rash noted. No erythema.  Psychiatric: She has a normal mood and affect. Her behavior is normal. Judgment and thought content normal.  Vitals reviewed.    Lab Results  Component Value Date   WBC 6.5 05/10/2017   HGB 9.1 repeated (L) 05/10/2017   HCT 28.9 (L) 05/10/2017   MCV 94 05/10/2017   PLT 231 05/10/2017     Chemistry      Component Value Date/Time   NA 143 03/08/2017 0747   NA 137 12/06/2016 0746   K 5.1 (H) 03/08/2017 0747   K 5.2 (H) 12/06/2016 0746   CL 106 03/08/2017 0747   CO2 26 03/08/2017 0747   CO2 23 12/06/2016 0746   BUN 37 (H) 03/08/2017 0747   BUN 26.1 (H) 12/06/2016 0746   CREATININE 1.4 (H) 03/08/2017 0747   CREATININE 1.2 (H) 12/06/2016 0746      Component Value Date/Time   CALCIUM 9.3 03/08/2017 0747   CALCIUM 9.1 12/06/2016 0746   ALKPHOS 49 03/08/2017 0747   ALKPHOS 41 12/06/2016 0746   AST 31 03/08/2017 0747   AST 23 12/06/2016 0746   ALT 33 03/08/2017 0747   ALT 26 12/06/2016 0746   BILITOT 0.40 03/08/2017 0747   BILITOT 0.26 12/06/2016 0746         Impression and Plan: Ms. Zanders is 62 year old white female. She has diabetes. She has iron deficiency anemia. She has a very low erythropoietin level.  We will go ahead and give her Aranesp today.  This always works for her.   I would think that her iron level should be okay.  I will plan to get her back after the holidays now.  I know that this dose of Aranesp will improve her blood count so that she will feel well and have a lot of stamina for the holidays.  She has had a really good year from my point of view.  Thankfully, nothing bad has been found when she was having these issues with her weight loss and stomach pain.    Volanda Napoleon, MD 11/15/20188:05 AM

## 2017-05-14 DIAGNOSIS — M65331 Trigger finger, right middle finger: Secondary | ICD-10-CM | POA: Diagnosis not present

## 2017-05-23 ENCOUNTER — Ambulatory Visit (INDEPENDENT_AMBULATORY_CARE_PROVIDER_SITE_OTHER): Payer: BLUE CROSS/BLUE SHIELD | Admitting: Orthopedic Surgery

## 2017-05-23 ENCOUNTER — Encounter (INDEPENDENT_AMBULATORY_CARE_PROVIDER_SITE_OTHER): Payer: Self-pay | Admitting: Orthopedic Surgery

## 2017-05-23 DIAGNOSIS — M65331 Trigger finger, right middle finger: Secondary | ICD-10-CM

## 2017-05-27 ENCOUNTER — Encounter (INDEPENDENT_AMBULATORY_CARE_PROVIDER_SITE_OTHER): Payer: Self-pay | Admitting: Orthopedic Surgery

## 2017-05-27 NOTE — Progress Notes (Signed)
   Post-Op Visit Note   Patient: Kelly Soto           Date of Birth: 1956-02-18           MRN: [id number] Visit Date: 05/23/2017 PCP: Kelly Neer, MD   Assessment & Plan:  Chief Complaint:  Chief Complaint  Patient presents with  . Post-op Follow-up    Right trigger release   Visit Diagnoses:  1. Trigger finger, right middle finger     Plan: Kelly Soto is a patient who is 9 days out trigger finger release on the right.  Incision looks intact..  Finger is moving well but she has predictable diminished grip strength.  Plan is for motion of the finger with avoidance of heavy lifting for at least 3 more weeks.  Follow-up with me as needed  Follow-Up Instructions: Return if symptoms worsen or fail to improve.   Orders:  No orders of the defined types were placed in this encounter.  No orders of the defined types were placed in this encounter.   Imaging: No results found.  PMFS History: Patient Active Problem List   Diagnosis Date Noted  . Chronic renal insufficiency 09/24/2013  . Anemia of renal disease 09/24/2013  . H/O gastric bypass 08/22/2013  . Intestinal malabsorption 08/22/2013  . Anemia, iron deficiency 08/22/2013  . Chronic eustachian tube dysfunction 03/28/2011  . Diabetes mellitus ([place]) 03/28/2011   Past Medical History:  Diagnosis Date  . Anemia of renal disease 09/24/2013  . Chronic renal insufficiency 09/24/2013  . Diabetes mellitus without complication ([place])   . Headache    migrains  . History of hiatal hernia   . Hypertension   . PONV (postoperative nausea and vomiting)   . Ulcer     Family History  Problem Relation Age of Onset  . Atrial fibrillation Mother   . Heart attack Father   . Sudden Cardiac Death Father   . Stroke Maternal Grandmother     Past Surgical History:  Procedure Laterality Date  . ABDOMINAL HYSTERECTOMY  1992  . CARPAL TUNNEL RELEASE Bilateral   . CHOLECYSTECTOMY N/A 05/11/2016   Procedure: LAPAROSCOPIC  CHOLECYSTECTOMY;  Surgeon: Erroll Luna, MD;  Location: [facility];  Service: General;  Laterality: N/A;  . GASTRIC BYPASS  2009   Social History   Occupational History  . Not on file  Tobacco Use  . Smoking status: Never Smoker  . Smokeless tobacco: Never Used  . Tobacco comment: never used tobacco  Substance and Sexual Activity  . Alcohol use: No    Alcohol/week: 0.0 oz  . Drug use: No  . Sexual activity: Yes

## 2017-06-01 ENCOUNTER — Telehealth: Payer: Self-pay | Admitting: Hematology & Oncology

## 2017-06-01 NOTE — Telephone Encounter (Signed)
Faxed medical records to: [facility] P: [phone] F: [phone] for Kelly Soto Track: [id number] Trans: [id number] Dob: 1956/01/04     COPY SCANNED

## 2017-07-13 ENCOUNTER — Other Ambulatory Visit: Payer: Self-pay

## 2017-07-13 ENCOUNTER — Inpatient Hospital Stay: Payer: BLUE CROSS/BLUE SHIELD | Attending: Hematology & Oncology

## 2017-07-13 ENCOUNTER — Ambulatory Visit: Payer: BLUE CROSS/BLUE SHIELD

## 2017-07-13 ENCOUNTER — Inpatient Hospital Stay: Payer: BLUE CROSS/BLUE SHIELD

## 2017-07-13 ENCOUNTER — Ambulatory Visit: Payer: BLUE CROSS/BLUE SHIELD | Admitting: Hematology & Oncology

## 2017-07-13 ENCOUNTER — Inpatient Hospital Stay (HOSPITAL_BASED_OUTPATIENT_CLINIC_OR_DEPARTMENT_OTHER): Payer: BLUE CROSS/BLUE SHIELD | Admitting: Hematology & Oncology

## 2017-07-13 VITALS — BP 149/81 | HR 105 | Temp 97.9°F | Resp 16 | Wt 117.8 lb

## 2017-07-13 DIAGNOSIS — D509 Iron deficiency anemia, unspecified: Secondary | ICD-10-CM | POA: Diagnosis not present

## 2017-07-13 DIAGNOSIS — E119 Type 2 diabetes mellitus without complications: Secondary | ICD-10-CM | POA: Insufficient documentation

## 2017-07-13 DIAGNOSIS — D500 Iron deficiency anemia secondary to blood loss (chronic): Secondary | ICD-10-CM

## 2017-07-13 DIAGNOSIS — D631 Anemia in chronic kidney disease: Secondary | ICD-10-CM

## 2017-07-13 DIAGNOSIS — N189 Chronic kidney disease, unspecified: Secondary | ICD-10-CM | POA: Diagnosis not present

## 2017-07-13 LAB — CMP (CANCER CENTER ONLY)
ALT: 34 U/L (ref 0–55)
AST: 25 U/L (ref 5–34)
Albumin: 3.5 g/dL (ref 3.5–5.0)
Alkaline Phosphatase: 57 U/L (ref 26–84)
Anion gap: 13 (ref 5–15)
BUN: 28 mg/dL — ABNORMAL HIGH (ref 7–22)
CO2: 22 mmol/L (ref 18–33)
Calcium: 9.3 mg/dL (ref 8.0–10.3)
Chloride: 109 mmol/L — ABNORMAL HIGH (ref 98–108)
Creatinine: 1.3 mg/dL — ABNORMAL HIGH (ref 0.60–1.10)
Glucose, Bld: 303 mg/dL — ABNORMAL HIGH (ref 73–118)
Potassium: 5 mmol/L (ref 3.5–5.1)
Sodium: 144 mmol/L (ref 128–145)
Total Bilirubin: 0.3 mg/dL (ref 0.2–1.2)
Total Protein: 6.9 g/dL (ref 6.4–8.1)

## 2017-07-13 LAB — CBC WITH DIFFERENTIAL (CANCER CENTER ONLY)
Basophils Absolute: 0 10*3/uL (ref 0.0–0.1)
Basophils Relative: 1 %
Eosinophils Absolute: 0.2 10*3/uL (ref 0.0–0.5)
Eosinophils Relative: 3 %
HCT: 41.3 % (ref 34.8–46.6)
Hemoglobin: 12.8 g/dL (ref 11.6–15.9)
Lymphocytes Relative: 22 %
Lymphs Abs: 1.6 10*3/uL (ref 0.9–3.3)
MCH: 30 pg (ref 26.0–34.0)
MCHC: 31 g/dL — ABNORMAL LOW (ref 32.0–36.0)
MCV: 96.7 fL (ref 81.0–101.0)
Monocytes Absolute: 0.5 10*3/uL (ref 0.1–0.9)
Monocytes Relative: 7 %
Neutro Abs: 4.8 10*3/uL (ref 1.5–6.5)
Neutrophils Relative %: 67 %
Platelet Count: 203 10*3/uL (ref 145–400)
RBC: 4.27 MIL/uL (ref 3.70–5.32)
RDW: 13.1 % (ref 11.1–15.7)
WBC Count: 7.1 10*3/uL (ref 3.9–10.3)

## 2017-07-13 LAB — RETICULOCYTES
RBC.: 4.26 MIL/uL (ref 3.70–5.45)
Retic Count, Absolute: 21.3 10*3/uL — ABNORMAL LOW (ref 33.7–90.7)
Retic Ct Pct: 0.5 % — ABNORMAL LOW (ref 0.7–2.1)

## 2017-07-13 LAB — IRON AND TIBC
Iron: 114 ug/dL (ref 41–142)
Saturation Ratios: 47 % (ref 21–57)
TIBC: 245 ug/dL (ref 236–444)
UIBC: 131 ug/dL

## 2017-07-13 LAB — FERRITIN: Ferritin: 381 ng/mL — ABNORMAL HIGH (ref 9–269)

## 2017-07-13 NOTE — Progress Notes (Signed)
Hematology and Oncology Follow Up Visit  Kelly Soto [id number] 1955-07-06 62 y.o. 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 is back for follow-up.  She had a wonderful holiday season.  She had a Christmas tree up in mid November.  She has not had any issues with fatigue or weakness.  She definitely feels more energetic.[name]  She did receive a dose of Aranesp back in November.  When we last saw her in November, her ferritin was 507 with an iron saturation of 31%.  She has had no problems with nausea or vomiting.  Her blood sugars have been under good control.  Her last hemoglobin A1c was 5.1.  She has had no problems with bleeding.  Her appetite has been good.  There is been no change in bowel or bladder habits.  Overall, her performance status is ECOG 1.    Medications:  Current Outpatient Medications:  .  acetaminophen (TYLENOL) 500 MG chewable tablet, Chew 500-1,000 mg by mouth every 6 (six) hours as needed for pain (depends on pain level)., Disp: , Rfl:  .  baclofen (LIORESAL) 10 MG tablet, Take 10 mg by mouth daily as needed. migraines, Disp: , Rfl:  .  Cholecalciferol (VITAMIN D3) 2000 units capsule, Take by mouth., Disp: , Rfl:  .  denosumab (PROLIA) 60 MG/ML SOLN injection, Inject 60 mg into the skin every 6 (six) months. Administer in upper arm, thigh, or abdomen  Next injection is due 05-2016, Disp: , Rfl:  .  diazepam (VALIUM) 5 MG tablet, Take 5 mg by mouth daily as needed for anxiety. , Disp: , Rfl: 0 .  doxycycline (VIBRA-TABS) 100 MG tablet, Take 100 mg by mouth., Disp: , Rfl:  .  fluticasone (FLONASE) 50 MCG/ACT nasal spray, Place 2 sprays into both nostrils at bedtime.,  Disp: , Rfl:  .  glyBURIDE-metformin (GLUCOVANCE) 2.5-500 MG per tablet, Take 2 tablets by mouth 2 (two) times daily., Disp: , Rfl:  .  GLYXAMBI 25-5 MG TABS, Take 1 tablet by mouth every morning., Disp: , Rfl: 3 .  hydroxypropyl methylcellulose / hypromellose (ISOPTO TEARS / GONIOVISC) 2.5 % ophthalmic solution, Place 1 drop into both eyes 3 (three) times daily., Disp: , Rfl:  .  IRON-FA-DSS-B CMPLX-VIT C PO, Take by mouth., Disp: , Rfl:  .  meclizine (ANTIVERT) 25 MG tablet, TAKE 1 TABLET BY MOUTH 3 TIMES A DAY AS NEEDED FOR VERTIGO, Disp: , Rfl: 0 .  metoprolol succinate (TOPROL-XL) 25 MG 24 hr tablet, Take 25 mg by mouth every morning., Disp: , Rfl:  .  Multiple Vitamin (MULTI-VITAMINS) TABS, Take by mouth., Disp: , Rfl:  .  omeprazole (PRILOSEC) 40 MG capsule, Take 40 mg by mouth 2 (two) times daily., Disp: , Rfl:  .  ONE TOUCH ULTRA TEST test strip, TEST BLOOD SUGAR ONCE DAILY, Disp: , Rfl: 3 .  ONETOUCH DELICA LANCETS 98J MISC, TEST BLOOD SUGAR ONCE DAILY, Disp: , Rfl: 3 .  Pancrelipase, Lip-Prot-Amyl, 24000-76000 units CPEP, Take 1 capsule (24,000 Units total) by mouth 3 (three) times daily before meals., Disp: 180 capsule, Rfl: 4 .  promethazine (PHENERGAN) 25 MG tablet, Take 25-50 mg by mouth every 8 (eight) hours as  needed (for migraine induced nausea)., Disp: , Rfl:  .  topiramate (TOPAMAX) 100 MG tablet, Take 100 mg by mouth daily., Disp: , Rfl: 5  Allergies:  Allergies  Allergen Reactions  . Aspirin Other (See Comments)    Cannot take due to gastric bypass surgery  . Nsaids Anaphylaxis  . Ace Inhibitors Other (See Comments)    Angioedema  . Sular [Nisoldipine Er] Other (See Comments)    angioedema  . Boniva [Ibandronic Acid] Other (See Comments)    Joint pain    Past Medical History, Surgical history, Social history, and Family History were reviewed and updated.  Review of Systems: Review of Systems  Constitutional: Negative.   HENT: Negative.   Eyes: Negative.    Respiratory: Negative.   Cardiovascular: Negative.   Gastrointestinal: Negative.   Genitourinary: Negative.   Musculoskeletal: Negative.   Skin: Negative.   Neurological: Negative.   Endo/Heme/Allergies: Negative.   Psychiatric/Behavioral: Negative.     Physical Exam:  weight is 117 lb 12.8 oz (53.4 kg). Her oral temperature is 97.9 F (36.6 C). Her blood pressure is 149/81 (abnormal) and her pulse is 105 (abnormal). Her respiration is 16 and oxygen saturation is 100%.   Physical Exam  Constitutional: She is oriented to person, place, and time.  HENT:  Head: Normocephalic and atraumatic.  Mouth/Throat: Oropharynx is clear and moist.  Eyes: EOM are normal. Pupils are equal, round, and reactive to light.  Neck: Normal range of motion.  Cardiovascular: Normal rate, regular rhythm and normal heart sounds.  Pulmonary/Chest: Effort normal and breath sounds normal.  Abdominal: Soft. Bowel sounds are normal.  Musculoskeletal: Normal range of motion. She exhibits no edema, tenderness or deformity.  Lymphadenopathy:    She has no cervical adenopathy.  Neurological: She is alert and oriented to person, place, and time.  Skin: Skin is warm and dry. No rash noted. No erythema.  Psychiatric: She has a normal mood and affect. Her behavior is normal. Judgment and thought content normal.  Vitals reviewed.    Lab Results  Component Value Date   WBC 7.1 07/13/2017   HGB 9.1 repeated (L) 05/10/2017   HCT 41.3 07/13/2017   MCV 96.7 07/13/2017   PLT 203 07/13/2017     Chemistry      Component Value Date/Time   NA 144 07/13/2017 0744   NA 145 05/10/2017 0748   NA 137 12/06/2016 0746   K 5.0 07/13/2017 0744   K 4.7 05/10/2017 0748   K 5.2 (H) 12/06/2016 0746   CL 109 (H) 07/13/2017 0744   CL 110 (H) 05/10/2017 0748   CO2 22 07/13/2017 0744   CO2 23 05/10/2017 0748   CO2 23 12/06/2016 0746   BUN 28 (H) 07/13/2017 0744   BUN 28 (H) 05/10/2017 0748   BUN 26.1 (H) 12/06/2016 0746    CREATININE 1.2 05/10/2017 0748   CREATININE 1.2 (H) 12/06/2016 0746      Component Value Date/Time   CALCIUM 9.3 07/13/2017 0744   CALCIUM 9.1 05/10/2017 0748   CALCIUM 9.1 12/06/2016 0746   ALKPHOS 57 07/13/2017 0744   ALKPHOS 61 05/10/2017 0748   ALKPHOS 41 12/06/2016 0746   AST 25 07/13/2017 0744   AST 23 12/06/2016 0746   ALT 34 07/13/2017 0744   ALT 31 05/10/2017 0748   ALT 26 12/06/2016 0746   BILITOT 0.3 07/13/2017 0744   BILITOT 0.26 12/06/2016 0746         Impression and Plan: Ms. Boehm is 62 year old white  female. She has diabetes. She has iron deficiency anemia. She has a very low erythropoietin level.  This is probably the best have seen her hemoglobin for quite a while.  We will go ahead and plan to get her back in 2 months.  I think this would be very reasonable given how well her blood count is.      Volanda Napoleon, MD 1/18/20198:34 AM

## 2017-07-16 ENCOUNTER — Telehealth: Payer: Self-pay

## 2017-07-16 NOTE — Telephone Encounter (Addendum)
-----   Message from Volanda Napoleon, MD sent at 07/13/2017  4:22 PM EST ----- Call - iron is ok!!  Laurey Arrow  Above message given to pt via phone. dph

## 2017-08-10 ENCOUNTER — Telehealth: Payer: Self-pay | Admitting: Hematology & Oncology

## 2017-08-10 NOTE — Telephone Encounter (Signed)
Second Request  Faxed medical records again to: [facility] P: [phone] F: [phone] for Giani Schear Track: [id number] Trans: [id number] Dob: [birth year] Dos: 05/10/2017     COPY SCANNED

## 2017-08-10 NOTE — Telephone Encounter (Signed)
Faxed medical records to [facility] for: Kelly Soto DOB: 06/15/1958 ID: [id number] CID: [id number] POL: [id number] Dos: 12/24/2016 until P: [phone] [id number] F: [phone]    COPY SCANNED

## 2017-08-13 ENCOUNTER — Other Ambulatory Visit: Payer: Self-pay | Admitting: Family Medicine

## 2017-08-13 DIAGNOSIS — Z1231 Encounter for screening mammogram for malignant neoplasm of breast: Secondary | ICD-10-CM

## 2017-09-07 ENCOUNTER — Inpatient Hospital Stay: Payer: BLUE CROSS/BLUE SHIELD | Attending: Hematology & Oncology | Admitting: Hematology & Oncology

## 2017-09-07 ENCOUNTER — Inpatient Hospital Stay: Payer: BLUE CROSS/BLUE SHIELD

## 2017-09-07 ENCOUNTER — Encounter: Payer: Self-pay | Admitting: Hematology & Oncology

## 2017-09-07 ENCOUNTER — Other Ambulatory Visit: Payer: Self-pay

## 2017-09-07 VITALS — BP 130/63 | HR 67 | Temp 98.5°F | Resp 16 | Wt 114.0 lb

## 2017-09-07 DIAGNOSIS — D631 Anemia in chronic kidney disease: Secondary | ICD-10-CM

## 2017-09-07 DIAGNOSIS — D500 Iron deficiency anemia secondary to blood loss (chronic): Secondary | ICD-10-CM

## 2017-09-07 DIAGNOSIS — N189 Chronic kidney disease, unspecified: Secondary | ICD-10-CM

## 2017-09-07 DIAGNOSIS — D509 Iron deficiency anemia, unspecified: Secondary | ICD-10-CM

## 2017-09-07 DIAGNOSIS — N183 Chronic kidney disease, stage 3 unspecified: Secondary | ICD-10-CM

## 2017-09-07 DIAGNOSIS — Z794 Long term (current) use of insulin: Secondary | ICD-10-CM

## 2017-09-07 DIAGNOSIS — E1122 Type 2 diabetes mellitus with diabetic chronic kidney disease: Secondary | ICD-10-CM | POA: Diagnosis not present

## 2017-09-07 LAB — IRON AND TIBC
Iron: 99 ug/dL (ref 41–142)
Saturation Ratios: 39 % (ref 21–57)
TIBC: 251 ug/dL (ref 236–444)
UIBC: 153 ug/dL

## 2017-09-07 LAB — CBC WITH DIFFERENTIAL (CANCER CENTER ONLY)
Basophils Absolute: 0 10*3/uL (ref 0.0–0.1)
Basophils Relative: 0 %
Eosinophils Absolute: 0.1 10*3/uL (ref 0.0–0.5)
Eosinophils Relative: 1 %
HCT: 33.8 % — ABNORMAL LOW (ref 34.8–46.6)
Hemoglobin: 10.8 g/dL — ABNORMAL LOW (ref 11.6–15.9)
Lymphocytes Relative: 20 %
Lymphs Abs: 1.9 10*3/uL (ref 0.9–3.3)
MCH: 29.5 pg (ref 26.0–34.0)
MCHC: 32 g/dL (ref 32.0–36.0)
MCV: 92.3 fL (ref 81.0–101.0)
Monocytes Absolute: 0.8 10*3/uL (ref 0.1–0.9)
Monocytes Relative: 8 %
Neutro Abs: 6.8 10*3/uL — ABNORMAL HIGH (ref 1.5–6.5)
Neutrophils Relative %: 71 %
Platelet Count: 207 10*3/uL (ref 145–400)
RBC: 3.66 MIL/uL — ABNORMAL LOW (ref 3.70–5.32)
RDW: 14.1 % (ref 11.1–15.7)
WBC Count: 9.6 10*3/uL (ref 3.9–10.0)

## 2017-09-07 LAB — CMP (CANCER CENTER ONLY)
ALT: 28 U/L (ref 10–47)
AST: 23 U/L (ref 11–38)
Albumin: 3.6 g/dL (ref 3.5–5.0)
Alkaline Phosphatase: 60 U/L (ref 26–84)
Anion gap: 14 (ref 5–15)
BUN: 36 mg/dL — ABNORMAL HIGH (ref 7–22)
CO2: 24 mmol/L (ref 18–33)
Calcium: 8.9 mg/dL (ref 8.0–10.3)
Chloride: 107 mmol/L (ref 98–108)
Creatinine: 1.1 mg/dL (ref 0.60–1.20)
Glucose, Bld: 149 mg/dL — ABNORMAL HIGH (ref 73–118)
Potassium: 5.2 mmol/L — ABNORMAL HIGH (ref 3.3–4.7)
Sodium: 145 mmol/L (ref 128–145)
Total Bilirubin: 0.4 mg/dL (ref 0.2–1.6)
Total Protein: 7.1 g/dL (ref 6.4–8.1)

## 2017-09-07 LAB — FERRITIN: Ferritin: 404 ng/mL — ABNORMAL HIGH (ref 9–269)

## 2017-09-07 MED ORDER — DARBEPOETIN ALFA 300 MCG/0.6ML IJ SOSY
PREFILLED_SYRINGE | INTRAMUSCULAR | Status: AC
  Filled 2017-09-07: qty 0.6

## 2017-09-07 MED ORDER — DARBEPOETIN ALFA 300 MCG/0.6ML IJ SOSY
300.0000 ug | PREFILLED_SYRINGE | Freq: Once | INTRAMUSCULAR | Status: AC
  Administered 2017-09-07: 300 ug via SUBCUTANEOUS

## 2017-09-07 NOTE — Patient Instructions (Signed)

## 2017-09-07 NOTE — Progress Notes (Signed)
Hematology and Oncology Follow Up Visit  Kelly Soto [id number] 08-02-1955 62 y.o. 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  She is doing well.  She has had no problems since we last saw her.  There is been no problems with fatigue.  She is had no nausea or vomiting.  Her blood sugars have been doing pretty well.  There is been no nausea or vomiting.  She is had no bleeding.  There is been no change in bowel or bladder habits.  Her last iron studies back in January showed a ferritin of 381 with iron saturation of 47%.  Overall, her performance status is ECOG 0.  We will go ahead and give her Aranesp today.  By doing this, I think we can plan to get her back in 3 months.   Medications:  Current Outpatient Medications:  .  acetaminophen (TYLENOL) 500 MG chewable tablet, Chew 500-1,000 mg by mouth every 6 (six) hours as needed for pain (depends on pain level)., Disp: , Rfl:  .  baclofen (LIORESAL) 10 MG tablet, Take 10 mg by mouth daily as needed. migraines, Disp: , Rfl:  .  Cholecalciferol (VITAMIN D3) 2000 units capsule, Take by mouth., Disp: , Rfl:  .  denosumab (PROLIA) 60 MG/ML SOLN injection, Inject 60 mg into the skin every 6 (six) months. Administer in upper arm, thigh, or abdomen  Next injection is due 05-2016, Disp: , Rfl:  .  diazepam (VALIUM) 5 MG tablet, Take 5 mg by mouth daily as needed for anxiety. , Disp: , Rfl: 0 .  fluticasone (FLONASE) 50 MCG/ACT nasal spray, Place 2 sprays into both nostrils at bedtime., Disp: , Rfl:  .  glyBURIDE-metformin (GLUCOVANCE) 2.5-500 MG per tablet, Take 2 tablets by mouth 2 (two) times daily., Disp: , Rfl:  .  GLYXAMBI  25-5 MG TABS, Take 1 tablet by mouth every morning., Disp: , Rfl: 3 .  hydroxypropyl methylcellulose / hypromellose (ISOPTO TEARS / GONIOVISC) 2.5 % ophthalmic solution, Place 1 drop into both eyes 3 (three) times daily., Disp: , Rfl:  .  IRON-FA-DSS-B CMPLX-VIT C PO, Take by mouth., Disp: , Rfl:  .  meclizine (ANTIVERT) 25 MG tablet, TAKE 1 TABLET BY MOUTH 3 TIMES A DAY AS NEEDED FOR VERTIGO, Disp: , Rfl: 0 .  metoprolol succinate (TOPROL-XL) 25 MG 24 hr tablet, Take 25 mg by mouth every morning., Disp: , Rfl:  .  Multiple Vitamin (MULTI-VITAMINS) TABS, Take by mouth., Disp: , Rfl:  .  omeprazole (PRILOSEC) 40 MG capsule, Take 40 mg by mouth 2 (two) times daily., Disp: , Rfl:  .  ONE TOUCH ULTRA TEST test strip, TEST BLOOD SUGAR ONCE DAILY, Disp: , Rfl: 3 .  ONETOUCH DELICA LANCETS 98P MISC, TEST BLOOD SUGAR ONCE DAILY, Disp: , Rfl: 3 .  promethazine (PHENERGAN) 25 MG tablet, Take 25-50 mg by mouth every 8 (eight) hours as needed (for migraine induced nausea)., Disp: , Rfl:  .  topiramate (TOPAMAX) 100 MG tablet, Take 100 mg by mouth daily., Disp: , Rfl: 5 No current facility-administered medications for this visit.   Facility-Administered Medications Ordered in Other Visits:  .  Darbepoetin Alfa (ARANESP)  injection 300 mcg, 300 mcg, Subcutaneous, Once, Ennever, Rudell Cobb, MD  Allergies:  Allergies  Allergen Reactions  . Aspirin Other (See Comments)    Cannot take due to gastric bypass surgery  . Nsaids Anaphylaxis and Other (See Comments)    S/p gastric bypass  . Ace Inhibitors Other (See Comments)    Angioedema  . Sular [Nisoldipine Er] Other (See Comments)    angioedema  . Boniva [Ibandronic Acid] Other (See Comments)    Joint pain    Past Medical History, Surgical history, Social history, and Family History were reviewed and updated.  Review of Systems: Review of Systems  Constitutional: Negative.   HENT: Negative.   Eyes: Negative.   Respiratory: Negative.   Cardiovascular:  Negative.   Gastrointestinal: Negative.   Genitourinary: Negative.   Musculoskeletal: Negative.   Skin: Negative.   Neurological: Negative.   Endo/Heme/Allergies: Negative.   Psychiatric/Behavioral: Negative.     Physical Exam:  weight is 114 lb (51.7 kg). Her oral temperature is 98.5 F (36.9 C). Her blood pressure is 130/63 and her pulse is 67. Her respiration is 16 and oxygen saturation is 100%.   Physical Exam  Constitutional: She is oriented to person, place, and time.  HENT:  Head: Normocephalic and atraumatic.  Mouth/Throat: Oropharynx is clear and moist.  Eyes: EOM are normal. Pupils are equal, round, and reactive to light.  Neck: Normal range of motion.  Cardiovascular: Normal rate, regular rhythm and normal heart sounds.  Pulmonary/Chest: Effort normal and breath sounds normal.  Abdominal: Soft. Bowel sounds are normal.  Musculoskeletal: Normal range of motion. She exhibits no edema, tenderness or deformity.  Lymphadenopathy:    She has no cervical adenopathy.  Neurological: She is alert and oriented to person, place, and time.  Skin: Skin is warm and dry. No rash noted. No erythema.  Psychiatric: She has a normal mood and affect. Her behavior is normal. Judgment and thought content normal.  Vitals reviewed.    Lab Results  Component Value Date   WBC 9.6 09/07/2017   HGB 9.1 repeated (L) 05/10/2017   HCT 33.8 (L) 09/07/2017   MCV 92.3 09/07/2017   PLT 207 09/07/2017     Chemistry      Component Value Date/Time   NA 145 09/07/2017 0755   NA 145 05/10/2017 0748   NA 137 12/06/2016 0746   K 5.2 (H) 09/07/2017 0755   K 4.7 05/10/2017 0748   K 5.2 (H) 12/06/2016 0746   CL 107 09/07/2017 0755   CL 110 (H) 05/10/2017 0748   CO2 24 09/07/2017 0755   CO2 23 05/10/2017 0748   CO2 23 12/06/2016 0746   BUN 36 (H) 09/07/2017 0755   BUN 28 (H) 05/10/2017 0748   BUN 26.1 (H) 12/06/2016 0746   CREATININE 1.10 09/07/2017 0755   CREATININE 1.2 05/10/2017 0748    CREATININE 1.2 (H) 12/06/2016 0746      Component Value Date/Time   CALCIUM 8.9 09/07/2017 0755   CALCIUM 9.1 05/10/2017 0748   CALCIUM 9.1 12/06/2016 0746   ALKPHOS 60 09/07/2017 0755   ALKPHOS 61 05/10/2017 0748   ALKPHOS 41 12/06/2016 0746   AST 23 09/07/2017 0755   AST 23 12/06/2016 0746   ALT 28 09/07/2017 0755   ALT 31 05/10/2017 0748   ALT 26 12/06/2016 0746   BILITOT 0.4 09/07/2017 0755   BILITOT 0.26 12/06/2016 0746         Impression and Plan: Kelly Soto is 62 year old white female. She  has diabetes. She has iron deficiency anemia. She has a very low erythropoietin level.  We will go ahead and give her Aranesp today.  By doing this, we can get her back in 3 months.  It typically is 3 months that we give her Aranesp.  I think that as long as her blood sugars are well controlled, she should do okay.  Volanda Napoleon, MD 3/15/20198:37 AM

## 2017-09-10 ENCOUNTER — Telehealth: Payer: Self-pay | Admitting: *Deleted

## 2017-09-10 NOTE — Telephone Encounter (Addendum)
-----   Message from Volanda Napoleon, MD sent at 09/07/2017  5:09 PM EDT ----- Called patient to let her know that her- iron level is ok!!

## 2017-10-16 ENCOUNTER — Ambulatory Visit
Admission: RE | Admit: 2017-10-16 | Discharge: 2017-10-16 | Disposition: A | Discharge status: Home / Self Care | Payer: BLUE CROSS/BLUE SHIELD | Source: Ambulatory Visit | Attending: Family Medicine | Admitting: Family Medicine

## 2017-10-16 DIAGNOSIS — Z1231 Encounter for screening mammogram for malignant neoplasm of breast: Secondary | ICD-10-CM

## 2017-12-07 ENCOUNTER — Inpatient Hospital Stay: Payer: BLUE CROSS/BLUE SHIELD

## 2017-12-07 ENCOUNTER — Inpatient Hospital Stay: Payer: BLUE CROSS/BLUE SHIELD | Attending: Hematology & Oncology | Admitting: Hematology & Oncology

## 2017-12-07 ENCOUNTER — Other Ambulatory Visit: Payer: Self-pay | Admitting: *Deleted

## 2017-12-07 ENCOUNTER — Telehealth: Payer: Self-pay | Admitting: *Deleted

## 2017-12-07 ENCOUNTER — Other Ambulatory Visit: Payer: Self-pay

## 2017-12-07 VITALS — BP 186/78 | HR 64 | Temp 98.2°F | Resp 18 | Wt 120.0 lb

## 2017-12-07 DIAGNOSIS — N189 Chronic kidney disease, unspecified: Secondary | ICD-10-CM

## 2017-12-07 DIAGNOSIS — D631 Anemia in chronic kidney disease: Secondary | ICD-10-CM | POA: Diagnosis not present

## 2017-12-07 DIAGNOSIS — E119 Type 2 diabetes mellitus without complications: Secondary | ICD-10-CM

## 2017-12-07 DIAGNOSIS — D509 Iron deficiency anemia, unspecified: Secondary | ICD-10-CM | POA: Diagnosis not present

## 2017-12-07 DIAGNOSIS — D500 Iron deficiency anemia secondary to blood loss (chronic): Secondary | ICD-10-CM

## 2017-12-07 DIAGNOSIS — N183 Chronic kidney disease, stage 3 unspecified: Secondary | ICD-10-CM

## 2017-12-07 DIAGNOSIS — N184 Chronic kidney disease, stage 4 (severe): Secondary | ICD-10-CM

## 2017-12-07 LAB — CBC WITH DIFFERENTIAL (CANCER CENTER ONLY)
Basophils Absolute: 0 10*3/uL (ref 0.0–0.1)
Basophils Relative: 1 %
Eosinophils Absolute: 0.1 10*3/uL (ref 0.0–0.5)
Eosinophils Relative: 1 %
HCT: 35.3 % (ref 34.8–46.6)
Hemoglobin: 11 g/dL — ABNORMAL LOW (ref 11.6–15.9)
Lymphocytes Relative: 25 %
Lymphs Abs: 2 10*3/uL (ref 0.9–3.3)
MCH: 29.8 pg (ref 26.0–34.0)
MCHC: 31.2 g/dL — ABNORMAL LOW (ref 32.0–36.0)
MCV: 95.7 fL (ref 81.0–101.0)
Monocytes Absolute: 0.6 10*3/uL (ref 0.1–0.9)
Monocytes Relative: 8 %
Neutro Abs: 5.3 10*3/uL (ref 1.5–6.5)
Neutrophils Relative %: 65 %
Platelet Count: 212 10*3/uL (ref 145–400)
RBC: 3.69 MIL/uL — ABNORMAL LOW (ref 3.70–5.32)
RDW: 13 % (ref 11.1–15.7)
WBC Count: 8.1 10*3/uL (ref 3.9–10.0)

## 2017-12-07 LAB — CMP (CANCER CENTER ONLY)
ALT: 28 U/L (ref 0–55)
AST: 22 U/L (ref 5–34)
Albumin: 4.1 g/dL (ref 3.5–5.0)
Alkaline Phosphatase: 58 U/L (ref 40–150)
Anion gap: 10 (ref 3–11)
BUN: 32 mg/dL — ABNORMAL HIGH (ref 7–26)
CO2: 24 mmol/L (ref 22–29)
Calcium: 10.2 mg/dL (ref 8.4–10.4)
Chloride: 107 mmol/L (ref 98–109)
Creatinine: 1.14 mg/dL — ABNORMAL HIGH (ref 0.60–1.10)
GFR, Est AFR Am: 58 mL/min — ABNORMAL LOW (ref >60)
GFR, Estimated: 50 mL/min — ABNORMAL LOW (ref >60)
Glucose, Bld: 149 mg/dL — ABNORMAL HIGH (ref 70–140)
Potassium: 6.1 mmol/L (ref 3.5–5.1)
Sodium: 141 mmol/L (ref 136–145)
Total Bilirubin: 0.2 mg/dL (ref 0.2–1.2)
Total Protein: 7.2 g/dL (ref 6.4–8.3)

## 2017-12-07 LAB — RETICULOCYTES
RBC.: 3.7 MIL/uL (ref 3.70–5.45)
Retic Count, Absolute: 29.6 10*3/uL — ABNORMAL LOW (ref 33.7–90.7)
Retic Ct Pct: 0.8 % (ref 0.7–2.1)

## 2017-12-07 LAB — IRON AND TIBC
Iron: 106 ug/dL (ref 41–142)
Saturation Ratios: 34 % (ref 21–57)
TIBC: 314 ug/dL (ref 236–444)
UIBC: 208 ug/dL

## 2017-12-07 LAB — FERRITIN: Ferritin: 508 ng/mL — ABNORMAL HIGH (ref 9–269)

## 2017-12-07 NOTE — Telephone Encounter (Signed)
Critical Value Potassium 6.1 Dr Marin Olp is aware of results. He would like patient to come in for recheck. Message sent to scheduler.

## 2017-12-07 NOTE — Progress Notes (Signed)
Hematology and Oncology Follow Up Visit  Kelly Soto [id number] [birth year] 62 y.o. 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 is back for follow-up.  She is doing well.  She has had no problems since we last saw her.  There is been no problems with fatigue.  She is had no nausea or vomiting.  Her blood sugars have been doing pretty well.  There is been no nausea or vomiting.  She is had no bleeding.  There is been no change in bowel or bladder habits.  Her last iron studies back in January showed a ferritin of 404 with iron saturation of 39%.  Overall, her performance status is ECOG 0.     Medications:  Current Outpatient Medications:  .  acetaminophen (TYLENOL) 500 MG chewable tablet, Chew 500-1,000 mg by mouth every 6 (six) hours as needed for pain (depends on pain level)., Disp: , Rfl:  .  baclofen (LIORESAL) 10 MG tablet, Take 10 mg by mouth daily as needed. migraines, Disp: , Rfl:  .  Cholecalciferol (VITAMIN D3) 2000 units capsule, Take by mouth., Disp: , Rfl:  .  denosumab (PROLIA) 60 MG/ML SOLN injection, Inject 60 mg into the skin every 6 (six) months. Administer in upper arm, thigh, or abdomen  Next injection is due 05-2016, Disp: , Rfl:  .  diazepam (VALIUM) 5 MG tablet, Take 5 mg by mouth daily as needed for anxiety. , Disp: , Rfl: 0 .  fluticasone (FLONASE) 50 MCG/ACT nasal spray, Place 2 sprays into both nostrils at bedtime., Disp: , Rfl:  .  glyBURIDE-metformin (GLUCOVANCE) 2.5-500 MG per tablet, Take 2 tablets by mouth 2 (two) times daily., Disp: , Rfl:  .  GLYXAMBI 25-5 MG TABS, Take 1 tablet by mouth every morning., Disp: , Rfl: 3 .  hydroxypropyl methylcellulose /  hypromellose (ISOPTO TEARS / GONIOVISC) 2.5 % ophthalmic solution, Place 1 drop into both eyes 3 (three) times daily., Disp: , Rfl:  .  IRON-FA-DSS-B CMPLX-VIT C PO, Take by mouth., Disp: , Rfl:  .  meclizine (ANTIVERT) 25 MG tablet, TAKE 1 TABLET BY MOUTH 3 TIMES A DAY AS NEEDED FOR VERTIGO, Disp: , Rfl: 0 .  metoprolol succinate (TOPROL-XL) 25 MG 24 hr tablet, Take 25 mg by mouth every morning., Disp: , Rfl:  .  Multiple Vitamin (MULTI-VITAMINS) TABS, Take by mouth., Disp: , Rfl:  .  omeprazole (PRILOSEC) 40 MG capsule, Take 40 mg by mouth 2 (two) times daily., Disp: , Rfl:  .  ONE TOUCH ULTRA TEST test strip, TEST BLOOD SUGAR ONCE DAILY, Disp: , Rfl: 3 .  ONETOUCH DELICA LANCETS 49E MISC, TEST BLOOD SUGAR ONCE DAILY, Disp: , Rfl: 3 .  promethazine (PHENERGAN) 25 MG tablet, Take 25-50 mg by mouth every 8 (eight) hours as needed (for migraine induced nausea)., Disp: , Rfl:  .  topiramate (TOPAMAX) 100 MG tablet, Take 100 mg by mouth daily., Disp: , Rfl: 5  Allergies:  Allergies  Allergen Reactions  . Aspirin Other (See Comments)    Cannot take due to gastric bypass surgery  . Nsaids Anaphylaxis and Other (See Comments)    S/p gastric bypass Cannot take orally due to gastric bypass  .  Ace Inhibitors Other (See Comments)    Angioedema  . Sular [Nisoldipine Er] Other (See Comments)    angioedema  . Boniva [Ibandronic Acid] Other (See Comments)    Joint pain    Past Medical History, Surgical history, Social history, and Family History were reviewed and updated.  Review of Systems: Review of Systems  Constitutional: Negative.   HENT: Negative.   Eyes: Negative.   Respiratory: Negative.   Cardiovascular: Negative.   Gastrointestinal: Negative.   Genitourinary: Negative.   Musculoskeletal: Negative.   Skin: Negative.   Neurological: Negative.   Endo/Heme/Allergies: Negative.   Psychiatric/Behavioral: Negative.     Physical Exam:  vitals were not taken for this visit.    Physical Exam  Constitutional: She is oriented to person, place, and time.  HENT:  Head: Normocephalic and atraumatic.  Mouth/Throat: Oropharynx is clear and moist.  Eyes: Pupils are equal, round, and reactive to light. EOM are normal.  Neck: Normal range of motion.  Cardiovascular: Normal rate, regular rhythm and normal heart sounds.  Pulmonary/Chest: Effort normal and breath sounds normal.  Abdominal: Soft. Bowel sounds are normal.  Musculoskeletal: Normal range of motion. She exhibits no edema, tenderness or deformity.  Lymphadenopathy:    She has no cervical adenopathy.  Neurological: She is alert and oriented to person, place, and time.  Skin: Skin is warm and dry. No rash noted. No erythema.  Psychiatric: She has a normal mood and affect. Her behavior is normal. Judgment and thought content normal.  Vitals reviewed.    Lab Results  Component Value Date   WBC 8.1 12/07/2017   HGB 11.0 (L) 12/07/2017   HCT 35.3 12/07/2017   MCV 95.7 12/07/2017   PLT 212 12/07/2017     Chemistry      Component Value Date/Time   NA 145 09/07/2017 0755   NA 145 05/10/2017 0748   NA 137 12/06/2016 0746   K 5.2 (H) 09/07/2017 0755   K 4.7 05/10/2017 0748   K 5.2 (H) 12/06/2016 0746   CL 107 09/07/2017 0755   CL 110 (H) 05/10/2017 0748   CO2 24 09/07/2017 0755   CO2 23 05/10/2017 0748   CO2 23 12/06/2016 0746   BUN 36 (H) 09/07/2017 0755   BUN 28 (H) 05/10/2017 0748   BUN 26.1 (H) 12/06/2016 0746   CREATININE 1.10 09/07/2017 0755   CREATININE 1.2 05/10/2017 0748   CREATININE 1.2 (H) 12/06/2016 0746      Component Value Date/Time   CALCIUM 8.9 09/07/2017 0755   CALCIUM 9.1 05/10/2017 0748   CALCIUM 9.1 12/06/2016 0746   ALKPHOS 60 09/07/2017 0755   ALKPHOS 61 05/10/2017 0748   ALKPHOS 41 12/06/2016 0746   AST 23 09/07/2017 0755   AST 23 12/06/2016 0746   ALT 28 09/07/2017 0755   ALT 31 05/10/2017 0748   ALT 26 12/06/2016 0746   BILITOT 0.4 09/07/2017 0755   BILITOT 0.26  12/06/2016 0746         Impression and Plan: Kelly Soto is 62 year old white female. She has diabetes. She has iron deficiency anemia. She has a very low erythropoietin level.  She does not need any Aranesp today.    I think that as long as her blood sugars are well controlled, she should do okay.  We will get her back in 3 months.  Volanda Napoleon, MD 6/14/20198:15 AM

## 2017-12-10 ENCOUNTER — Telehealth: Payer: Self-pay | Admitting: *Deleted

## 2017-12-10 ENCOUNTER — Inpatient Hospital Stay: Payer: BLUE CROSS/BLUE SHIELD

## 2017-12-10 DIAGNOSIS — N189 Chronic kidney disease, unspecified: Secondary | ICD-10-CM

## 2017-12-10 DIAGNOSIS — D500 Iron deficiency anemia secondary to blood loss (chronic): Secondary | ICD-10-CM

## 2017-12-10 DIAGNOSIS — D631 Anemia in chronic kidney disease: Secondary | ICD-10-CM

## 2017-12-10 DIAGNOSIS — N184 Chronic kidney disease, stage 4 (severe): Secondary | ICD-10-CM

## 2017-12-10 LAB — CMP (CANCER CENTER ONLY)
ALT: 30 U/L (ref 10–47)
AST: 29 U/L (ref 11–38)
Albumin: 3.4 g/dL — ABNORMAL LOW (ref 3.5–5.0)
Alkaline Phosphatase: 52 U/L (ref 26–84)
Anion gap: 12 (ref 5–15)
BUN: 30 mg/dL — ABNORMAL HIGH (ref 7–22)
CO2: 26 mmol/L (ref 18–33)
Calcium: 9.3 mg/dL (ref 8.0–10.3)
Chloride: 105 mmol/L (ref 98–108)
Creatinine: 1.3 mg/dL — ABNORMAL HIGH (ref 0.60–1.20)
Glucose, Bld: 196 mg/dL — ABNORMAL HIGH (ref 73–118)
Potassium: 4.5 mmol/L (ref 3.3–4.7)
Sodium: 143 mmol/L (ref 128–145)
Total Bilirubin: 0.4 mg/dL (ref 0.2–1.6)
Total Protein: 7.1 g/dL (ref 6.4–8.1)

## 2017-12-10 LAB — CBC WITH DIFFERENTIAL (CANCER CENTER ONLY)
Basophils Absolute: 0 10*3/uL (ref 0.0–0.1)
Basophils Relative: 0 %
Eosinophils Absolute: 0.1 10*3/uL (ref 0.0–0.5)
Eosinophils Relative: 1 %
HCT: 33.9 % — ABNORMAL LOW (ref 34.8–46.6)
Hemoglobin: 10.7 g/dL — ABNORMAL LOW (ref 11.6–15.9)
Lymphocytes Relative: 25 %
Lymphs Abs: 1.8 10*3/uL (ref 0.9–3.3)
MCH: 29.7 pg (ref 26.0–34.0)
MCHC: 31.6 g/dL — ABNORMAL LOW (ref 32.0–36.0)
MCV: 94.2 fL (ref 81.0–101.0)
Monocytes Absolute: 0.6 10*3/uL (ref 0.1–0.9)
Monocytes Relative: 8 %
Neutro Abs: 4.7 10*3/uL (ref 1.5–6.5)
Neutrophils Relative %: 66 %
Platelet Count: 194 10*3/uL (ref 145–400)
RBC: 3.6 MIL/uL — ABNORMAL LOW (ref 3.70–5.32)
RDW: 13.2 % (ref 11.1–15.7)
WBC Count: 7.2 10*3/uL (ref 3.9–10.0)

## 2017-12-10 NOTE — Telephone Encounter (Addendum)
Patient is aware of results  ----- Message from Volanda Napoleon, MD sent at 12/07/2017  1:42 PM EDT ----- Call - the iron is ok!!  Laurey Arrow

## 2018-03-08 ENCOUNTER — Other Ambulatory Visit: Payer: Self-pay

## 2018-03-08 ENCOUNTER — Encounter: Payer: Self-pay | Admitting: Hematology & Oncology

## 2018-03-08 ENCOUNTER — Inpatient Hospital Stay: Payer: BLUE CROSS/BLUE SHIELD

## 2018-03-08 ENCOUNTER — Inpatient Hospital Stay: Payer: BLUE CROSS/BLUE SHIELD | Attending: Hematology & Oncology | Admitting: Hematology & Oncology

## 2018-03-08 VITALS — BP 170/76 | HR 75 | Temp 98.2°F | Resp 16 | Wt 123.0 lb

## 2018-03-08 DIAGNOSIS — D631 Anemia in chronic kidney disease: Secondary | ICD-10-CM

## 2018-03-08 DIAGNOSIS — N189 Chronic kidney disease, unspecified: Secondary | ICD-10-CM | POA: Diagnosis present

## 2018-03-08 DIAGNOSIS — Z79899 Other long term (current) drug therapy: Secondary | ICD-10-CM | POA: Diagnosis not present

## 2018-03-08 DIAGNOSIS — D500 Iron deficiency anemia secondary to blood loss (chronic): Secondary | ICD-10-CM

## 2018-03-08 DIAGNOSIS — N184 Chronic kidney disease, stage 4 (severe): Secondary | ICD-10-CM

## 2018-03-08 DIAGNOSIS — D509 Iron deficiency anemia, unspecified: Secondary | ICD-10-CM

## 2018-03-08 DIAGNOSIS — E119 Type 2 diabetes mellitus without complications: Secondary | ICD-10-CM | POA: Diagnosis not present

## 2018-03-08 LAB — CMP (CANCER CENTER ONLY)
ALT: 14 U/L (ref 0–44)
AST: 17 U/L (ref 15–41)
Albumin: 3.7 g/dL (ref 3.5–5.0)
Alkaline Phosphatase: 57 U/L (ref 38–126)
Anion gap: 10 (ref 5–15)
BUN: 25 mg/dL — ABNORMAL HIGH (ref 8–23)
CO2: 24 mmol/L (ref 22–32)
Calcium: 9.5 mg/dL (ref 8.9–10.3)
Chloride: 108 mmol/L (ref 98–111)
Creatinine: 1.35 mg/dL — ABNORMAL HIGH (ref 0.44–1.00)
GFR, Est AFR Am: 47 mL/min — ABNORMAL LOW (ref >60)
GFR, Estimated: 41 mL/min — ABNORMAL LOW (ref >60)
Glucose, Bld: 206 mg/dL — ABNORMAL HIGH (ref 70–99)
Potassium: 5.1 mmol/L (ref 3.5–5.1)
Sodium: 142 mmol/L (ref 135–145)
Total Bilirubin: 0.2 mg/dL — ABNORMAL LOW (ref 0.3–1.2)
Total Protein: 6.8 g/dL (ref 6.5–8.1)

## 2018-03-08 LAB — CBC WITH DIFFERENTIAL (CANCER CENTER ONLY)
Basophils Absolute: 0 10*3/uL (ref 0.0–0.1)
Basophils Relative: 1 %
Eosinophils Absolute: 0.1 10*3/uL (ref 0.0–0.5)
Eosinophils Relative: 1 %
HCT: 32.5 % — ABNORMAL LOW (ref 34.8–46.6)
Hemoglobin: 10.1 g/dL — ABNORMAL LOW (ref 11.6–15.9)
Lymphocytes Relative: 20 %
Lymphs Abs: 1.3 10*3/uL (ref 0.9–3.3)
MCH: 31 pg (ref 26.0–34.0)
MCHC: 31.1 g/dL — ABNORMAL LOW (ref 32.0–36.0)
MCV: 99.7 fL (ref 81.0–101.0)
Monocytes Absolute: 0.6 10*3/uL (ref 0.1–0.9)
Monocytes Relative: 9 %
Neutro Abs: 4.5 10*3/uL (ref 1.5–6.5)
Neutrophils Relative %: 69 %
Platelet Count: 206 10*3/uL (ref 145–400)
RBC: 3.26 MIL/uL — ABNORMAL LOW (ref 3.70–5.32)
RDW: 12.5 % (ref 11.1–15.7)
WBC Count: 6.5 10*3/uL (ref 3.9–10.0)

## 2018-03-08 LAB — IRON AND TIBC
Iron: 68 ug/dL (ref 41–142)
Saturation Ratios: 24 % (ref 21–57)
TIBC: 285 ug/dL (ref 236–444)
UIBC: 217 ug/dL

## 2018-03-08 LAB — FERRITIN: Ferritin: 461 ng/mL — ABNORMAL HIGH (ref 11–307)

## 2018-03-08 MED ORDER — DARBEPOETIN ALFA 300 MCG/0.6ML IJ SOSY
PREFILLED_SYRINGE | INTRAMUSCULAR | Status: AC
Start: 2018-03-08 — End: ?
  Filled 2018-03-08: qty 0.6

## 2018-03-08 MED ORDER — DARBEPOETIN ALFA 300 MCG/0.6ML IJ SOSY
300.0000 ug | PREFILLED_SYRINGE | Freq: Once | INTRAMUSCULAR | Status: AC
  Administered 2018-03-08: 300 ug via SUBCUTANEOUS

## 2018-03-08 NOTE — Patient Instructions (Signed)
Darbepoetin Alfa injection (Aranesp) What is this medicine? DARBEPOETIN ALFA (dar be POE e tin AL fa) helps your body make more red blood cells. It is used to treat anemia caused by chronic kidney failure and chemotherapy. This medicine may be used for other purposes; ask your health care provider or pharmacist if you have questions. COMMON BRAND NAME(S): Aranesp What should I tell my health care provider before I take this medicine? They need to know if you have any of these conditions: -blood clotting disorders or history of blood clots -cancer patient not on chemotherapy -cystic fibrosis -heart disease, such as angina, heart failure, or a history of a heart attack -hemoglobin level of 12 g/dL or greater -high blood pressure -low levels of folate, iron, or vitamin B12 -seizures -an unusual or allergic reaction to darbepoetin, erythropoietin, albumin, hamster proteins, latex, other medicines, foods, dyes, or preservatives -pregnant or trying to get pregnant -breast-feeding How should I use this medicine? This medicine is for injection into a vein or under the skin. It is usually given by a health care professional in a hospital or clinic setting. If you get this medicine at home, you will be taught how to prepare and give this medicine. Use exactly as directed. Take your medicine at regular intervals. Do not take your medicine more often than directed. It is important that you put your used needles and syringes in a special sharps container. Do not put them in a trash can. If you do not have a sharps container, call your pharmacist or healthcare provider to get one. A special MedGuide will be given to you by the pharmacist with each prescription and refill. Be sure to read this information carefully each time. Talk to your pediatrician regarding the use of this medicine in children. While this medicine may be used in children as young as 1 year for selected conditions, precautions do  apply. Overdosage: If you think you have taken too much of this medicine contact a poison control center or emergency room at once. NOTE: This medicine is only for you. Do not share this medicine with others. What if I miss a dose? If you miss a dose, take it as soon as you can. If it is almost time for your next dose, take only that dose. Do not take double or extra doses. What may interact with this medicine? Do not take this medicine with any of the following medications: -epoetin alfa This list may not describe all possible interactions. Give your health care provider a list of all the medicines, herbs, non-prescription drugs, or dietary supplements you use. Also tell them if you smoke, drink alcohol, or use illegal drugs. Some items may interact with your medicine. What should I watch for while using this medicine? Your condition will be monitored carefully while you are receiving this medicine. You may need blood work done while you are taking this medicine. What side effects may I notice from receiving this medicine? Side effects that you should report to your doctor or health care professional as soon as possible: -allergic reactions like skin rash, itching or hives, swelling of the face, lips, or tongue -breathing problems -changes in vision -chest pain -confusion, trouble speaking or understanding -feeling faint or lightheaded, falls -high blood pressure -muscle aches or pains -pain, swelling, warmth in the leg -rapid weight gain -severe headaches -sudden numbness or weakness of the face, arm or leg -trouble walking, dizziness, loss of balance or coordination -seizures (convulsions) -swelling of the ankles, feet,  hands -unusually weak or tired Side effects that usually do not require medical attention (report to your doctor or health care professional if they continue or are bothersome): -diarrhea -fever, chills (flu-like symptoms) -headaches -nausea, vomiting -redness,  stinging, or swelling at site where injected This list may not describe all possible side effects. Call your doctor for medical advice about side effects. You may report side effects to FDA at 1-800-FDA-1088. Where should I keep my medicine? Keep out of the reach of children. Store in a refrigerator between 2 and 8 degrees C (36 and 46 degrees F). Do not freeze. Do not shake. Throw away any unused portion if using a single-dose vial. Throw away any unused medicine after the expiration date. NOTE: This sheet is a summary. It may not cover all possible information. If you have questions about this medicine, talk to your doctor, pharmacist, or health care provider.  2018 Elsevier/Gold Standard (2016-01-31 19:52:26)

## 2018-03-08 NOTE — Progress Notes (Signed)
Hematology and Oncology Follow Up Visit  Kelly Soto [id number] March 01, 1956 62 y.o. 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  She has had a nice summer.  She has enjoy her retirement.  She has been traveling.  They just got back from the beach.  They will be going to the mountains soon.  Her last iron studies back in June showed a ferritin of 508 with an iron saturation of 34%.  She probably has not received Aranesp for 5 or 6 months.  She does feel little tired today.  She is had no issues with bowels or bladder.  As always, her blood sugars are on the high side.  She is had no fever.  She is had no bleeding.  She is had no leg swelling.  Her performance status is ECOG 0.     Medications:  Current Outpatient Medications:  .  acetaminophen (TYLENOL) 500 MG chewable tablet, Chew 500-1,000 mg by mouth every 6 (six) hours as needed for pain (depends on pain level)., Disp: , Rfl:  .  baclofen (LIORESAL) 10 MG tablet, Take 10 mg by mouth daily as needed. migraines, Disp: , Rfl:  .  denosumab (PROLIA) 60 MG/ML SOLN injection, Inject 60 mg into the skin every 6 (six) months. Administer in upper arm, thigh, or abdomen  Next injection is due 05-2016, Disp: , Rfl:  .  diazepam (VALIUM) 5 MG tablet, Take 5 mg by mouth daily as needed for anxiety. , Disp: , Rfl: 0 .  fluticasone (FLONASE) 50 MCG/ACT nasal spray, Place 2 sprays into both nostrils at bedtime., Disp: , Rfl:  .  Galcanezumab-gnlm (EMGALITY) 120 MG/ML SOSY, Inject 120 mg/mL into the skin every 30 (thirty) days., Disp: , Rfl:  .  glyBURIDE-metformin (GLUCOVANCE) 2.5-500 MG per tablet, Take 2 tablets by mouth 2  (two) times daily., Disp: , Rfl:  .  GLYXAMBI 25-5 MG TABS, Take 1 tablet by mouth every morning., Disp: , Rfl: 3 .  hydroxypropyl methylcellulose / hypromellose (ISOPTO TEARS / GONIOVISC) 2.5 % ophthalmic solution, Place 1 drop into both eyes 3 (three) times daily., Disp: , Rfl:  .  meclizine (ANTIVERT) 25 MG tablet, TAKE 1 TABLET BY MOUTH 3 TIMES A DAY AS NEEDED FOR VERTIGO, Disp: , Rfl: 0 .  metoprolol succinate (TOPROL-XL) 25 MG 24 hr tablet, Take 25 mg by mouth every morning., Disp: , Rfl:  .  Multiple Vitamin (MULTI-VITAMINS) TABS, Take by mouth., Disp: , Rfl:  .  omeprazole (PRILOSEC) 40 MG capsule, Take 40 mg by mouth 2 (two) times daily., Disp: , Rfl:  .  ONE TOUCH ULTRA TEST test strip, TEST BLOOD SUGAR ONCE DAILY, Disp: , Rfl: 3 .  ONETOUCH DELICA LANCETS 96V MISC, TEST BLOOD SUGAR ONCE DAILY, Disp: , Rfl: 3 .  promethazine (PHENERGAN) 25 MG tablet, Take 25-50 mg by mouth every 8 (eight) hours as needed (for migraine induced nausea)., Disp: , Rfl:   Allergies:  Allergies  Allergen Reactions  . Aspirin Other (See Comments)    Cannot take due to gastric bypass surgery  . Nsaids Anaphylaxis and Other (See Comments)    S/p gastric bypass Cannot take orally due  to gastric bypass  . Ace Inhibitors Other (See Comments)    Angioedema  . Sular [Nisoldipine Er] Other (See Comments)    angioedema  . Boniva [Ibandronic Acid] Other (See Comments)    Joint pain    Past Medical History, Surgical history, Social history, and Family History were reviewed and updated.  Review of Systems: Review of Systems  Constitutional: Negative.   HENT: Negative.   Eyes: Negative.   Respiratory: Negative.   Cardiovascular: Negative.   Gastrointestinal: Negative.   Genitourinary: Negative.   Musculoskeletal: Negative.   Skin: Negative.   Neurological: Negative.   Endo/Heme/Allergies: Negative.   Psychiatric/Behavioral: Negative.     Physical Exam:  weight is 123 lb (55.8 kg). Her oral  temperature is 98.2 F (36.8 C). Her blood pressure is 170/76 (abnormal) and her pulse is 75. Her respiration is 16 and oxygen saturation is 100%.   Physical Exam  Constitutional: She is oriented to person, place, and time.  HENT:  Head: Normocephalic and atraumatic.  Mouth/Throat: Oropharynx is clear and moist.  Eyes: Pupils are equal, round, and reactive to light. EOM are normal.  Neck: Normal range of motion.  Cardiovascular: Normal rate, regular rhythm and normal heart sounds.  Pulmonary/Chest: Effort normal and breath sounds normal.  Abdominal: Soft. Bowel sounds are normal.  Musculoskeletal: Normal range of motion. She exhibits no edema, tenderness or deformity.  Lymphadenopathy:    She has no cervical adenopathy.  Neurological: She is alert and oriented to person, place, and time.  Skin: Skin is warm and dry. No rash noted. No erythema.  Psychiatric: She has a normal mood and affect. Her behavior is normal. Judgment and thought content normal.  Vitals reviewed.    Lab Results  Component Value Date   WBC 6.5 03/08/2018   HGB 10.1 (L) 03/08/2018   HCT 32.5 (L) 03/08/2018   MCV 99.7 03/08/2018   PLT 206 03/08/2018     Chemistry      Component Value Date/Time   NA 143 12/10/2017 0743   NA 145 05/10/2017 0748   NA 137 12/06/2016 0746   K 4.5 12/10/2017 0743   K 4.7 05/10/2017 0748   K 5.2 (H) 12/06/2016 0746   CL 105 12/10/2017 0743   CL 110 (H) 05/10/2017 0748   CO2 26 12/10/2017 0743   CO2 23 05/10/2017 0748   CO2 23 12/06/2016 0746   BUN 30 (H) 12/10/2017 0743   BUN 28 (H) 05/10/2017 0748   BUN 26.1 (H) 12/06/2016 0746   CREATININE 1.30 (H) 12/10/2017 0743   CREATININE 1.2 05/10/2017 0748   CREATININE 1.2 (H) 12/06/2016 0746      Component Value Date/Time   CALCIUM 9.3 12/10/2017 0743   CALCIUM 9.1 05/10/2017 0748   CALCIUM 9.1 12/06/2016 0746   ALKPHOS 52 12/10/2017 0743   ALKPHOS 61 05/10/2017 0748   ALKPHOS 41 12/06/2016 0746   AST 29 12/10/2017 0743    AST 23 12/06/2016 0746   ALT 30 12/10/2017 0743   ALT 31 05/10/2017 0748   ALT 26 12/06/2016 0746   BILITOT 0.4 12/10/2017 0743   BILITOT 0.26 12/06/2016 0746         Impression and Plan: Kelly Soto is 62 year old white female. She has diabetes. She has iron deficiency anemia. She has a very low erythropoietin level.  We will go ahead and give her a dose of Aranesp today.  This will help her.  I would like to get her back right before the holidays.  I would like to make sure that her blood counts and iron are okay so that she will have no issues with her blood over the holidays as she is quite busy with her family and with holiday festivities.    Volanda Napoleon, MD 9/13/20198:27 AM

## 2018-03-11 ENCOUNTER — Telehealth: Payer: Self-pay | Admitting: *Deleted

## 2018-03-11 NOTE — Telephone Encounter (Addendum)
Patient is aware of results  ----- Message from Volanda Napoleon, MD sent at 03/08/2018  3:23 PM EDT ----- Call - the iron levl is ok!!  Laurey Arrow

## 2018-03-28 ENCOUNTER — Other Ambulatory Visit: Payer: Self-pay | Admitting: Gastroenterology

## 2018-03-28 DIAGNOSIS — K769 Liver disease, unspecified: Secondary | ICD-10-CM

## 2018-04-15 ENCOUNTER — Ambulatory Visit
Admission: RE | Admit: 2018-04-15 | Discharge: 2018-04-15 | Disposition: A | Discharge status: Home / Self Care | Payer: BLUE CROSS/BLUE SHIELD | Source: Ambulatory Visit | Attending: Gastroenterology | Admitting: Gastroenterology

## 2018-04-15 DIAGNOSIS — K769 Liver disease, unspecified: Secondary | ICD-10-CM

## 2018-04-15 MED ORDER — GADOBENATE DIMEGLUMINE 529 MG/ML IV SOLN
6.0000 mL | Freq: Once | INTRAVENOUS | Status: AC | PRN
  Administered 2018-04-15: 6 mL via INTRAVENOUS

## 2018-05-09 ENCOUNTER — Other Ambulatory Visit: Payer: Self-pay

## 2018-05-09 ENCOUNTER — Telehealth: Payer: Self-pay | Admitting: *Deleted

## 2018-05-09 ENCOUNTER — Inpatient Hospital Stay (HOSPITAL_BASED_OUTPATIENT_CLINIC_OR_DEPARTMENT_OTHER): Payer: BLUE CROSS/BLUE SHIELD | Admitting: Hematology & Oncology

## 2018-05-09 ENCOUNTER — Inpatient Hospital Stay: Payer: BLUE CROSS/BLUE SHIELD | Attending: Hematology & Oncology

## 2018-05-09 VITALS — BP 192/87 | HR 72 | Temp 98.6°F | Resp 18 | Wt 127.0 lb

## 2018-05-09 DIAGNOSIS — Z9884 Bariatric surgery status: Secondary | ICD-10-CM | POA: Insufficient documentation

## 2018-05-09 DIAGNOSIS — D500 Iron deficiency anemia secondary to blood loss (chronic): Secondary | ICD-10-CM

## 2018-05-09 DIAGNOSIS — E119 Type 2 diabetes mellitus without complications: Secondary | ICD-10-CM | POA: Diagnosis not present

## 2018-05-09 DIAGNOSIS — Z79899 Other long term (current) drug therapy: Secondary | ICD-10-CM | POA: Diagnosis not present

## 2018-05-09 DIAGNOSIS — N189 Chronic kidney disease, unspecified: Secondary | ICD-10-CM

## 2018-05-09 DIAGNOSIS — D631 Anemia in chronic kidney disease: Secondary | ICD-10-CM

## 2018-05-09 DIAGNOSIS — D509 Iron deficiency anemia, unspecified: Secondary | ICD-10-CM | POA: Diagnosis not present

## 2018-05-09 LAB — CBC WITH DIFFERENTIAL (CANCER CENTER ONLY)
Abs Immature Granulocytes: 0.04 10*3/uL (ref 0.00–0.07)
Basophils Absolute: 0 10*3/uL (ref 0.0–0.1)
Basophils Relative: 1 %
Eosinophils Absolute: 0.1 10*3/uL (ref 0.0–0.5)
Eosinophils Relative: 1 %
HCT: 40 % (ref 36.0–46.0)
Hemoglobin: 12.1 g/dL (ref 12.0–15.0)
Immature Granulocytes: 1 %
Lymphocytes Relative: 22 %
Lymphs Abs: 1.8 10*3/uL (ref 0.7–4.0)
MCH: 28.7 pg (ref 26.0–34.0)
MCHC: 30.3 g/dL (ref 30.0–36.0)
MCV: 95 fL (ref 80.0–100.0)
Monocytes Absolute: 0.5 10*3/uL (ref 0.1–1.0)
Monocytes Relative: 7 %
Neutro Abs: 5.5 10*3/uL (ref 1.7–7.7)
Neutrophils Relative %: 68 %
Platelet Count: 206 10*3/uL (ref 150–400)
RBC: 4.21 MIL/uL (ref 3.87–5.11)
RDW: 13.1 % (ref 11.5–15.5)
WBC Count: 8 10*3/uL (ref 4.0–10.5)
nRBC: 0 % (ref 0.0–0.2)

## 2018-05-09 LAB — CMP (CANCER CENTER ONLY)
ALT: 12 U/L (ref 0–44)
AST: 15 U/L (ref 15–41)
Albumin: 3.9 g/dL (ref 3.5–5.0)
Alkaline Phosphatase: 52 U/L (ref 38–126)
Anion gap: 11 (ref 5–15)
BUN: 33 mg/dL — ABNORMAL HIGH (ref 8–23)
CO2: 25 mmol/L (ref 22–32)
Calcium: 10.2 mg/dL (ref 8.9–10.3)
Chloride: 106 mmol/L (ref 98–111)
Creatinine: 1.3 mg/dL — ABNORMAL HIGH (ref 0.44–1.00)
GFR, Est AFR Am: 50 mL/min — ABNORMAL LOW (ref >60)
GFR, Estimated: 43 mL/min — ABNORMAL LOW (ref >60)
Glucose, Bld: 196 mg/dL — ABNORMAL HIGH (ref 70–99)
Potassium: 5.7 mmol/L — ABNORMAL HIGH (ref 3.5–5.1)
Sodium: 142 mmol/L (ref 135–145)
Total Bilirubin: 0.3 mg/dL (ref 0.3–1.2)
Total Protein: 7.3 g/dL (ref 6.5–8.1)

## 2018-05-09 LAB — RETICULOCYTES
Immature Retic Fract: 3.3 % (ref 2.3–15.9)
RBC.: 4.21 MIL/uL (ref 3.87–5.11)
Retic Count, Absolute: 18.5 10*3/uL — ABNORMAL LOW (ref 19.0–186.0)
Retic Ct Pct: 0.4 % (ref 0.4–3.1)

## 2018-05-09 LAB — IRON AND TIBC
Iron: 149 ug/dL — ABNORMAL HIGH (ref 41–142)
Saturation Ratios: 52 % (ref 21–57)
TIBC: 289 ug/dL (ref 236–444)
UIBC: 140 ug/dL (ref 120–384)

## 2018-05-09 LAB — FERRITIN: Ferritin: 429 ng/mL — ABNORMAL HIGH (ref 11–307)

## 2018-05-09 NOTE — Progress Notes (Signed)
Hematology and Oncology Follow Up Visit  Kelly Soto [id number] 1956/04/10 62 y.o. 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  She looks fantastic.  She really has had no problems since we last saw her.  She is on a new medication for migraines.  She takes this once a month.  This really has helped.  We last saw her in September, her ferritin was 461 with an iron saturation of 24%.  She is had no cough.  There is been no weakness.  She is had no rashes.  She is had no change in bowel or bladder habits.  Para graph her blood pressure is quite high today.  Not sure as to why it is on the high side.  Overall, her performance status is ECOG 0.     Medications:  Current Outpatient Medications:  .  acetaminophen (TYLENOL) 500 MG chewable tablet, Chew 500-1,000 mg by mouth every 6 (six) hours as needed for pain (depends on pain level)., Disp: , Rfl:  .  baclofen (LIORESAL) 10 MG tablet, Take 10 mg by mouth daily as needed. migraines, Disp: , Rfl:  .  denosumab (PROLIA) 60 MG/ML SOLN injection, Inject 60 mg into the skin every 6 (six) months. Administer in upper arm, thigh, or abdomen  Next injection is due 05-2016, Disp: , Rfl:  .  diazepam (VALIUM) 5 MG tablet, Take 5 mg by mouth daily as needed for anxiety. , Disp: , Rfl: 0 .  fluticasone (FLONASE) 50 MCG/ACT nasal spray, Place 2 sprays into both nostrils at bedtime., Disp: , Rfl:  .  Galcanezumab-gnlm (EMGALITY) 120 MG/ML SOSY, Inject 120 mg/mL into the skin every 30 (thirty) days., Disp: , Rfl:  .  glyBURIDE-metformin (GLUCOVANCE) 2.5-500 MG per tablet, Take 2 tablets by mouth 2 (two) times daily., Disp: , Rfl:  .   GLYXAMBI 25-5 MG TABS, Take 1 tablet by mouth every morning., Disp: , Rfl: 3 .  hydroxypropyl methylcellulose / hypromellose (ISOPTO TEARS / GONIOVISC) 2.5 % ophthalmic solution, Place 1 drop into both eyes 3 (three) times daily., Disp: , Rfl:  .  meclizine (ANTIVERT) 25 MG tablet, TAKE 1 TABLET BY MOUTH 3 TIMES A DAY AS NEEDED FOR VERTIGO, Disp: , Rfl: 0 .  metoprolol succinate (TOPROL-XL) 25 MG 24 hr tablet, Take 25 mg by mouth every morning., Disp: , Rfl:  .  Multiple Vitamin (MULTI-VITAMINS) TABS, Take by mouth., Disp: , Rfl:  .  omeprazole (PRILOSEC) 40 MG capsule, Take 40 mg by mouth 2 (two) times daily., Disp: , Rfl:  .  ONE TOUCH ULTRA TEST test strip, TEST BLOOD SUGAR ONCE DAILY, Disp: , Rfl: 3 .  ONETOUCH DELICA LANCETS 44Y MISC, TEST BLOOD SUGAR ONCE DAILY, Disp: , Rfl: 3 .  promethazine (PHENERGAN) 25 MG tablet, Take 25-50 mg by mouth every 8 (eight) hours as needed (for migraine induced nausea)., Disp: , Rfl:   Allergies:  Allergies  Allergen Reactions  . Aspirin Other (See Comments)    Cannot take due to gastric bypass surgery  . Nsaids Anaphylaxis and Other (See Comments)    S/p gastric bypass Cannot take orally due to gastric bypass  . Ace Inhibitors  Other (See Comments)    Angioedema  . Sular [Nisoldipine Er] Other (See Comments)    angioedema  . Boniva [Ibandronic Acid] Other (See Comments)    Joint pain    Past Medical History, Surgical history, Social history, and Family History were reviewed and updated.  Review of Systems: Review of Systems  Constitutional: Negative.   HENT: Negative.   Eyes: Negative.   Respiratory: Negative.   Cardiovascular: Negative.   Gastrointestinal: Negative.   Genitourinary: Negative.   Musculoskeletal: Negative.   Skin: Negative.   Neurological: Negative.   Endo/Heme/Allergies: Negative.   Psychiatric/Behavioral: Negative.     Physical Exam:  weight is 127 lb (57.6 kg). Her tympanic temperature is 98.6 F (37 C). Her blood  pressure is 192/87 (abnormal) and her pulse is 72. Her respiration is 18 and oxygen saturation is 100%.   Physical Exam  Constitutional: She is oriented to person, place, and time.  HENT:  Head: Normocephalic and atraumatic.  Mouth/Throat: Oropharynx is clear and moist.  Eyes: Pupils are equal, round, and reactive to light. EOM are normal.  Neck: Normal range of motion.  Cardiovascular: Normal rate, regular rhythm and normal heart sounds.  Pulmonary/Chest: Effort normal and breath sounds normal.  Abdominal: Soft. Bowel sounds are normal.  Musculoskeletal: Normal range of motion. She exhibits no edema, tenderness or deformity.  Lymphadenopathy:    She has no cervical adenopathy.  Neurological: She is alert and oriented to person, place, and time.  Skin: Skin is warm and dry. No rash noted. No erythema.  Psychiatric: She has a normal mood and affect. Her behavior is normal. Judgment and thought content normal.  Vitals reviewed.    Lab Results  Component Value Date   WBC 8.0 05/09/2018   HGB 12.1 05/09/2018   HCT 40.0 05/09/2018   MCV 95.0 05/09/2018   PLT 206 05/09/2018     Chemistry      Component Value Date/Time   NA 142 03/08/2018 0748   NA 145 05/10/2017 0748   NA 137 12/06/2016 0746   K 5.1 03/08/2018 0748   K 4.7 05/10/2017 0748   K 5.2 (H) 12/06/2016 0746   CL 108 03/08/2018 0748   CL 110 (H) 05/10/2017 0748   CO2 24 03/08/2018 0748   CO2 23 05/10/2017 0748   CO2 23 12/06/2016 0746   BUN 25 (H) 03/08/2018 0748   BUN 28 (H) 05/10/2017 0748   BUN 26.1 (H) 12/06/2016 0746   CREATININE 1.35 (H) 03/08/2018 0748   CREATININE 1.2 05/10/2017 0748   CREATININE 1.2 (H) 12/06/2016 0746      Component Value Date/Time   CALCIUM 9.5 03/08/2018 0748   CALCIUM 9.1 05/10/2017 0748   CALCIUM 9.1 12/06/2016 0746   ALKPHOS 57 03/08/2018 0748   ALKPHOS 61 05/10/2017 0748   ALKPHOS 41 12/06/2016 0746   AST 17 03/08/2018 0748   AST 23 12/06/2016 0746   ALT 14 03/08/2018 0748    ALT 31 05/10/2017 0748   ALT 26 12/06/2016 0746   BILITOT 0.2 (L) 03/08/2018 0748   BILITOT 0.26 12/06/2016 0746         Impression and Plan: Kelly Soto is 62 year old white female. She has diabetes. She has iron deficiency anemia. She has a very low erythropoietin level.  I am absolutely amazed as to have that her blood count is today.  I cannot remember her hemoglobin being this high.  We will get her back in 3 months now.  We will get her through  the holidays in the wintertime for the most part.  I know that she will have a wonderful holiday season with her family.    Volanda Napoleon, MD 11/14/20198:33 AM

## 2018-05-09 NOTE — Telephone Encounter (Signed)
As noted below by Dr. Marin Olp, I informed patient of her potassium level. She is coming tomorrow to repeat lab. She verbalized understanding.

## 2018-05-09 NOTE — Telephone Encounter (Signed)
-----   Message from Volanda Napoleon, MD sent at 05/09/2018 12:14 PM EST ----- Call - her K+ is high!!  Need to repeat this tomorrow!!!  Please set up lab appt!!  Laurey Arrow

## 2018-05-10 ENCOUNTER — Other Ambulatory Visit: Payer: Self-pay | Admitting: *Deleted

## 2018-05-10 ENCOUNTER — Telehealth: Payer: Self-pay | Admitting: *Deleted

## 2018-05-10 ENCOUNTER — Inpatient Hospital Stay: Payer: BLUE CROSS/BLUE SHIELD

## 2018-05-10 DIAGNOSIS — N189 Chronic kidney disease, unspecified: Secondary | ICD-10-CM

## 2018-05-10 DIAGNOSIS — N184 Chronic kidney disease, stage 4 (severe): Secondary | ICD-10-CM

## 2018-05-10 DIAGNOSIS — D631 Anemia in chronic kidney disease: Secondary | ICD-10-CM

## 2018-05-10 LAB — BASIC METABOLIC PANEL - CANCER CENTER ONLY
Anion gap: 13 (ref 5–15)
BUN: 32 mg/dL — ABNORMAL HIGH (ref 7–22)
CO2: 26 mmol/L (ref 18–33)
Calcium: 9.9 mg/dL (ref 8.0–10.3)
Chloride: 106 mmol/L (ref 98–108)
Creatinine: 1.3 mg/dL — ABNORMAL HIGH (ref 0.60–1.20)
Glucose, Bld: 192 mg/dL — ABNORMAL HIGH (ref 73–118)
Potassium: 5.3 mmol/L — ABNORMAL HIGH (ref 3.3–4.7)
Sodium: 145 mmol/L (ref 128–145)

## 2018-05-10 NOTE — Telephone Encounter (Signed)
As noted below by Dr. Ennever, I informed patient of her iron level. She verbalized understanding. 

## 2018-05-10 NOTE — Telephone Encounter (Signed)
-----   Message from Volanda Napoleon, MD sent at 05/09/2018  3:51 PM EST ----- Call - iron is ok!!  Kelly Soto

## 2018-08-09 ENCOUNTER — Inpatient Hospital Stay: Payer: BLUE CROSS/BLUE SHIELD | Attending: Hematology & Oncology | Admitting: Hematology & Oncology

## 2018-08-09 ENCOUNTER — Other Ambulatory Visit: Payer: Self-pay

## 2018-08-09 ENCOUNTER — Inpatient Hospital Stay: Payer: BLUE CROSS/BLUE SHIELD

## 2018-08-09 ENCOUNTER — Encounter: Payer: Self-pay | Admitting: Hematology & Oncology

## 2018-08-09 VITALS — BP 167/74 | HR 68 | Temp 97.6°F | Resp 18 | Wt 130.0 lb

## 2018-08-09 DIAGNOSIS — D631 Anemia in chronic kidney disease: Secondary | ICD-10-CM | POA: Insufficient documentation

## 2018-08-09 DIAGNOSIS — D509 Iron deficiency anemia, unspecified: Secondary | ICD-10-CM | POA: Diagnosis not present

## 2018-08-09 DIAGNOSIS — E119 Type 2 diabetes mellitus without complications: Secondary | ICD-10-CM | POA: Diagnosis not present

## 2018-08-09 DIAGNOSIS — N189 Chronic kidney disease, unspecified: Secondary | ICD-10-CM

## 2018-08-09 DIAGNOSIS — N184 Chronic kidney disease, stage 4 (severe): Secondary | ICD-10-CM

## 2018-08-09 DIAGNOSIS — D500 Iron deficiency anemia secondary to blood loss (chronic): Secondary | ICD-10-CM

## 2018-08-09 LAB — IRON AND TIBC
Iron: 82 ug/dL (ref 41–142)
Saturation Ratios: 26 % (ref 21–57)
TIBC: 320 ug/dL (ref 236–444)
UIBC: 238 ug/dL (ref 120–384)

## 2018-08-09 LAB — CBC WITH DIFFERENTIAL (CANCER CENTER ONLY)
Abs Immature Granulocytes: 0.05 10*3/uL (ref 0.00–0.07)
Basophils Absolute: 0.1 10*3/uL (ref 0.0–0.1)
Basophils Relative: 1 %
Eosinophils Absolute: 0.1 10*3/uL (ref 0.0–0.5)
Eosinophils Relative: 1 %
HCT: 29.8 % — ABNORMAL LOW (ref 36.0–46.0)
Hemoglobin: 9.3 g/dL — ABNORMAL LOW (ref 12.0–15.0)
Immature Granulocytes: 1 %
Lymphocytes Relative: 17 %
Lymphs Abs: 1.4 10*3/uL (ref 0.7–4.0)
MCH: 30.9 pg (ref 26.0–34.0)
MCHC: 31.2 g/dL (ref 30.0–36.0)
MCV: 99 fL (ref 80.0–100.0)
Monocytes Absolute: 0.6 10*3/uL (ref 0.1–1.0)
Monocytes Relative: 8 %
Neutro Abs: 5.8 10*3/uL (ref 1.7–7.7)
Neutrophils Relative %: 72 %
Platelet Count: 228 10*3/uL (ref 150–400)
RBC: 3.01 MIL/uL — ABNORMAL LOW (ref 3.87–5.11)
RDW: 14.6 % (ref 11.5–15.5)
WBC Count: 8 10*3/uL (ref 4.0–10.5)
nRBC: 0 % (ref 0.0–0.2)

## 2018-08-09 LAB — CMP (CANCER CENTER ONLY)
ALT: 20 U/L (ref 0–44)
AST: 16 U/L (ref 15–41)
Albumin: 4.1 g/dL (ref 3.5–5.0)
Alkaline Phosphatase: 48 U/L (ref 38–126)
Anion gap: 10 (ref 5–15)
BUN: 30 mg/dL — ABNORMAL HIGH (ref 8–23)
CO2: 24 mmol/L (ref 22–32)
Calcium: 9.8 mg/dL (ref 8.9–10.3)
Chloride: 106 mmol/L (ref 98–111)
Creatinine: 1.24 mg/dL — ABNORMAL HIGH (ref 0.44–1.00)
GFR, Est AFR Am: 54 mL/min — ABNORMAL LOW (ref >60)
GFR, Estimated: 46 mL/min — ABNORMAL LOW (ref >60)
Glucose, Bld: 185 mg/dL — ABNORMAL HIGH (ref 70–99)
Potassium: 5.4 mmol/L — ABNORMAL HIGH (ref 3.5–5.1)
Sodium: 140 mmol/L (ref 135–145)
Total Bilirubin: 0.3 mg/dL (ref 0.3–1.2)
Total Protein: 6.5 g/dL (ref 6.5–8.1)

## 2018-08-09 LAB — RETICULOCYTES
Immature Retic Fract: 14.4 % (ref 2.3–15.9)
RBC.: 3.01 MIL/uL — ABNORMAL LOW (ref 3.87–5.11)
Retic Count, Absolute: 79.8 10*3/uL (ref 19.0–186.0)
Retic Ct Pct: 2.7 % (ref 0.4–3.1)

## 2018-08-09 LAB — FERRITIN: Ferritin: 511 ng/mL — ABNORMAL HIGH (ref 11–307)

## 2018-08-09 MED ORDER — DARBEPOETIN ALFA 300 MCG/0.6ML IJ SOSY
PREFILLED_SYRINGE | INTRAMUSCULAR | Status: AC
  Filled 2018-08-09: qty 0.6

## 2018-08-09 MED ORDER — DARBEPOETIN ALFA 300 MCG/0.6ML IJ SOSY
300.0000 ug | PREFILLED_SYRINGE | Freq: Once | INTRAMUSCULAR | Status: AC
  Administered 2018-08-09: 300 ug via SUBCUTANEOUS

## 2018-08-09 NOTE — Progress Notes (Signed)
Hematology and Oncology Follow Up Visit  Kelly Soto [id number] [birth year] 63 y.o. 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 is back for follow-up.  She is having some issues with her blood sugars.  This morning, her fasting blood sugar was 208.  She sees her diabetic doctor next week.  She had a nice holiday season.  She was quite busy over the holidays.  She is really busy this weekend.  She and her husband are in the process of converting a barn to a storage shed.  She has had no bleeding.  She has had no change in bowel or bladder habits.  She has had no cough.  She has had no rashes.  There is been no leg swelling.  Overall, her performance status is ECOG 0.     Medications:  Current Outpatient Medications:  .  acetaminophen (TYLENOL) 500 MG chewable tablet, Chew 500-1,000 mg by mouth every 6 (six) hours as needed for pain (depends on pain level)., Disp: , Rfl:  .  baclofen (LIORESAL) 10 MG tablet, Take 10 mg by mouth daily as needed. migraines, Disp: , Rfl:  .  denosumab (PROLIA) 60 MG/ML SOLN injection, Inject 60 mg into the skin every 6 (six) months. Administer in upper arm, thigh, or abdomen  Next injection is due 05-2016, Disp: , Rfl:  .  diazepam (VALIUM) 5 MG tablet, Take 5 mg by mouth daily as needed for anxiety. , Disp: , Rfl: 0 .  fluticasone (FLONASE) 50 MCG/ACT nasal spray, Place 2 sprays into both nostrils at bedtime., Disp: , Rfl:  .  Galcanezumab-gnlm (EMGALITY) 120 MG/ML SOSY, Inject 120 mg/mL into the skin every 30 (thirty) days., Disp: , Rfl:  .  glyBURIDE-metformin (GLUCOVANCE) 2.5-500 MG per tablet, Take 2 tablets by mouth 2 (two) times daily., Disp: , Rfl:  .   GLYXAMBI 25-5 MG TABS, Take 1 tablet by mouth every morning., Disp: , Rfl: 3 .  hydroxypropyl methylcellulose / hypromellose (ISOPTO TEARS / GONIOVISC) 2.5 % ophthalmic solution, Place 1 drop into both eyes 3 (three) times daily., Disp: , Rfl:  .  metoprolol succinate (TOPROL-XL) 25 MG 24 hr tablet, Take 25 mg by mouth every morning., Disp: , Rfl:  .  Multiple Vitamin (MULTI-VITAMINS) TABS, Take by mouth., Disp: , Rfl:  .  omeprazole (PRILOSEC) 40 MG capsule, Take 40 mg by mouth 2 (two) times daily., Disp: , Rfl:  .  ONE TOUCH ULTRA TEST test strip, TEST BLOOD SUGAR ONCE DAILY, Disp: , Rfl: 3 .  ONETOUCH DELICA LANCETS 38H MISC, TEST BLOOD SUGAR ONCE DAILY, Disp: , Rfl: 3 .  pioglitazone (ACTOS) 15 MG tablet, Take 15 mg by mouth daily., Disp: , Rfl:  No current facility-administered medications for this visit.   Facility-Administered Medications Ordered in Other Visits:  .  Darbepoetin Alfa (ARANESP) injection 300 mcg, 300 mcg, Subcutaneous, Once, Ennever, Rudell Cobb, MD  Allergies:  Allergies  Allergen Reactions  . Ace Inhibitors Other (See Comments) and Anaphylaxis    Angioedema  . Aspirin Other (See Comments)    Cannot take due to gastric bypass surgery  . Nsaids Anaphylaxis and Other (See Comments)    S/p gastric bypass Cannot  take orally due to gastric bypass S/p gastric bypass  . Sular [Nisoldipine Er] Other (See Comments)    angioedema  . Boniva [Ibandronic Acid] Other (See Comments)    Joint pain    Past Medical History, Surgical history, Social history, and Family History were reviewed and updated.  Review of Systems: Review of Systems  Constitutional: Negative.   HENT: Negative.   Eyes: Negative.   Respiratory: Negative.   Cardiovascular: Negative.   Gastrointestinal: Negative.   Genitourinary: Negative.   Musculoskeletal: Negative.   Skin: Negative.   Neurological: Negative.   Endo/Heme/Allergies: Negative.   Psychiatric/Behavioral: Negative.     Physical  Exam:  weight is 130 lb (59 kg). Her oral temperature is 97.6 F (36.4 C). Her blood pressure is 167/74 (abnormal) and her pulse is 68. Her respiration is 18 and oxygen saturation is 100%.   Physical Exam Vitals signs reviewed.  HENT:     Head: Normocephalic and atraumatic.  Eyes:     Pupils: Pupils are equal, round, and reactive to light.  Neck:     Musculoskeletal: Normal range of motion.  Cardiovascular:     Rate and Rhythm: Normal rate and regular rhythm.     Heart sounds: Normal heart sounds.  Pulmonary:     Effort: Pulmonary effort is normal.     Breath sounds: Normal breath sounds.  Abdominal:     General: Bowel sounds are normal.     Palpations: Abdomen is soft.  Musculoskeletal: Normal range of motion.        General: No tenderness or deformity.  Lymphadenopathy:     Cervical: No cervical adenopathy.  Skin:    General: Skin is warm and dry.     Findings: No erythema or rash.  Neurological:     Mental Status: She is alert and oriented to person, place, and time.  Psychiatric:        Behavior: Behavior normal.        Thought Content: Thought content normal.        Judgment: Judgment normal.      Lab Results  Component Value Date   WBC 8.0 08/09/2018   HGB 9.3 (L) 08/09/2018   HCT 29.8 (L) 08/09/2018   MCV 99.0 08/09/2018   PLT 228 08/09/2018     Chemistry      Component Value Date/Time   NA 140 08/09/2018 0747   NA 145 05/10/2017 0748   NA 137 12/06/2016 0746   K 5.4 (H) 08/09/2018 0747   K 4.7 05/10/2017 0748   K 5.2 (H) 12/06/2016 0746   CL 106 08/09/2018 0747   CL 110 (H) 05/10/2017 0748   CO2 24 08/09/2018 0747   CO2 23 05/10/2017 0748   CO2 23 12/06/2016 0746   BUN 30 (H) 08/09/2018 0747   BUN 28 (H) 05/10/2017 0748   BUN 26.1 (H) 12/06/2016 0746   CREATININE 1.24 (H) 08/09/2018 0747   CREATININE 1.2 05/10/2017 0748   CREATININE 1.2 (H) 12/06/2016 0746      Component Value Date/Time   CALCIUM 9.8 08/09/2018 0747   CALCIUM 9.1 05/10/2017  0748   CALCIUM 9.1 12/06/2016 0746   ALKPHOS 48 08/09/2018 0747   ALKPHOS 61 05/10/2017 0748   ALKPHOS 41 12/06/2016 0746   AST 16 08/09/2018 0747   AST 23 12/06/2016 0746   ALT 20 08/09/2018 0747   ALT 31 05/10/2017 0748   ALT 26 12/06/2016 0746   BILITOT 0.3 08/09/2018 0747   BILITOT 0.26 12/06/2016 0746  Impression and Plan: Ms. Rolon is 63 year old white female. She has diabetes. She has iron deficiency anemia. She has a very low erythropoietin level.  We will clearly have to give her Aranesp today.  It is been 5 months since she has had Aranesp.  This is not too bad for her.  We will see what her iron studies look like.  I will plan to get her back in another couple months.  Hopefully, her blood sugars will be a little bit better.   Volanda Napoleon, MD 2/14/20208:27 AM

## 2018-08-09 NOTE — Patient Instructions (Signed)

## 2018-10-11 ENCOUNTER — Other Ambulatory Visit: Payer: BLUE CROSS/BLUE SHIELD

## 2018-10-11 ENCOUNTER — Ambulatory Visit: Payer: BLUE CROSS/BLUE SHIELD

## 2018-10-11 ENCOUNTER — Ambulatory Visit: Payer: BLUE CROSS/BLUE SHIELD | Admitting: Hematology & Oncology

## 2018-11-20 ENCOUNTER — Other Ambulatory Visit: Payer: Self-pay | Admitting: Hematology & Oncology

## 2018-11-22 ENCOUNTER — Other Ambulatory Visit: Payer: Self-pay | Admitting: Family Medicine

## 2018-11-22 DIAGNOSIS — Z1231 Encounter for screening mammogram for malignant neoplasm of breast: Secondary | ICD-10-CM

## 2018-11-25 ENCOUNTER — Other Ambulatory Visit: Payer: Self-pay

## 2018-11-25 ENCOUNTER — Inpatient Hospital Stay: Payer: BLUE CROSS/BLUE SHIELD | Attending: Hematology & Oncology

## 2018-11-25 ENCOUNTER — Inpatient Hospital Stay (HOSPITAL_BASED_OUTPATIENT_CLINIC_OR_DEPARTMENT_OTHER): Payer: BLUE CROSS/BLUE SHIELD | Admitting: Hematology & Oncology

## 2018-11-25 ENCOUNTER — Encounter: Payer: Self-pay | Admitting: Hematology & Oncology

## 2018-11-25 ENCOUNTER — Inpatient Hospital Stay: Payer: BLUE CROSS/BLUE SHIELD

## 2018-11-25 VITALS — BP 176/76 | HR 68 | Temp 98.3°F | Resp 18 | Wt 130.0 lb

## 2018-11-25 DIAGNOSIS — N189 Chronic kidney disease, unspecified: Secondary | ICD-10-CM

## 2018-11-25 DIAGNOSIS — D509 Iron deficiency anemia, unspecified: Secondary | ICD-10-CM | POA: Insufficient documentation

## 2018-11-25 DIAGNOSIS — E119 Type 2 diabetes mellitus without complications: Secondary | ICD-10-CM | POA: Diagnosis not present

## 2018-11-25 DIAGNOSIS — D631 Anemia in chronic kidney disease: Secondary | ICD-10-CM

## 2018-11-25 DIAGNOSIS — D508 Other iron deficiency anemias: Secondary | ICD-10-CM

## 2018-11-25 DIAGNOSIS — N184 Chronic kidney disease, stage 4 (severe): Secondary | ICD-10-CM

## 2018-11-25 LAB — CBC WITH DIFFERENTIAL (CANCER CENTER ONLY)
Abs Immature Granulocytes: 0.05 10*3/uL (ref 0.00–0.07)
Basophils Absolute: 0 10*3/uL (ref 0.0–0.1)
Basophils Relative: 0 %
Eosinophils Absolute: 0.1 10*3/uL (ref 0.0–0.5)
Eosinophils Relative: 1 %
HCT: 33.4 % — ABNORMAL LOW (ref 36.0–46.0)
Hemoglobin: 10.3 g/dL — ABNORMAL LOW (ref 12.0–15.0)
Immature Granulocytes: 1 %
Lymphocytes Relative: 18 %
Lymphs Abs: 1.3 10*3/uL (ref 0.7–4.0)
MCH: 29 pg (ref 26.0–34.0)
MCHC: 30.8 g/dL (ref 30.0–36.0)
MCV: 94.1 fL (ref 80.0–100.0)
Monocytes Absolute: 0.6 10*3/uL (ref 0.1–1.0)
Monocytes Relative: 8 %
Neutro Abs: 5 10*3/uL (ref 1.7–7.7)
Neutrophils Relative %: 72 %
Platelet Count: 224 10*3/uL (ref 150–400)
RBC: 3.55 MIL/uL — ABNORMAL LOW (ref 3.87–5.11)
RDW: 14.6 % (ref 11.5–15.5)
WBC Count: 7 10*3/uL (ref 4.0–10.5)
nRBC: 0 % (ref 0.0–0.2)

## 2018-11-25 LAB — RETICULOCYTES
Immature Retic Fract: 10.3 % (ref 2.3–15.9)
RBC.: 3.5 MIL/uL — ABNORMAL LOW (ref 3.87–5.11)
Retic Count, Absolute: 51.5 10*3/uL (ref 19.0–186.0)
Retic Ct Pct: 1.5 % (ref 0.4–3.1)

## 2018-11-25 LAB — CMP (CANCER CENTER ONLY)
ALT: 14 U/L (ref 0–44)
AST: 14 U/L — ABNORMAL LOW (ref 15–41)
Albumin: 4.2 g/dL (ref 3.5–5.0)
Alkaline Phosphatase: 48 U/L (ref 38–126)
Anion gap: 9 (ref 5–15)
BUN: 39 mg/dL — ABNORMAL HIGH (ref 8–23)
CO2: 25 mmol/L (ref 22–32)
Calcium: 9.5 mg/dL (ref 8.9–10.3)
Chloride: 102 mmol/L (ref 98–111)
Creatinine: 1.22 mg/dL — ABNORMAL HIGH (ref 0.44–1.00)
GFR, Est AFR Am: 54 mL/min — ABNORMAL LOW (ref >60)
GFR, Estimated: 47 mL/min — ABNORMAL LOW (ref >60)
Glucose, Bld: 278 mg/dL — ABNORMAL HIGH (ref 70–99)
Potassium: 5.4 mmol/L — ABNORMAL HIGH (ref 3.5–5.1)
Sodium: 136 mmol/L (ref 135–145)
Total Bilirubin: 0.3 mg/dL (ref 0.3–1.2)
Total Protein: 7 g/dL (ref 6.5–8.1)

## 2018-11-25 LAB — IRON AND TIBC
Iron: 84 ug/dL (ref 41–142)
Saturation Ratios: 25 % (ref 21–57)
TIBC: 334 ug/dL (ref 236–444)
UIBC: 249 ug/dL (ref 120–384)

## 2018-11-25 LAB — FERRITIN: Ferritin: 529 ng/mL — ABNORMAL HIGH (ref 11–307)

## 2018-11-25 MED ORDER — DARBEPOETIN ALFA 300 MCG/0.6ML IJ SOSY
PREFILLED_SYRINGE | INTRAMUSCULAR | Status: AC
  Filled 2018-11-25: qty 0.6

## 2018-11-25 MED ORDER — DARBEPOETIN ALFA 300 MCG/0.6ML IJ SOSY
300.0000 ug | PREFILLED_SYRINGE | Freq: Once | INTRAMUSCULAR | Status: AC
  Administered 2018-11-25: 300 ug via SUBCUTANEOUS

## 2018-11-25 NOTE — Patient Instructions (Signed)

## 2018-11-25 NOTE — Progress Notes (Signed)
Hematology and Oncology Follow Up Visit  Kelly Soto [id number] Jul 31, 1955 63 y.o. 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 is back for follow-up.  She is doing pretty well.  She is managing the coronavirus without any problems.  She and her family actually went out to dinner yesterday.  They had a good time.  She has a new puppy.  It is an [place].  She really is happy about this.  Her blood sugars continue to be a real issue for her.  This morning, her blood sugar is 278.  She will see her family doctor regarding this and she may need have an adjustment with her diabetic medications.  We last saw her, her ferritin was 511 with an iron saturation of 26%.  She has had no bleeding.  She does have some ecchymoses on her right forearm.  This might be from the new puppy that she has.  Overall, her performance status is ECOG 0.     Medications:  Current Outpatient Medications:  .  acetaminophen (TYLENOL) 500 MG chewable tablet, Chew 500-1,000 mg by mouth every 6 (six) hours as needed for pain (depends on pain level)., Disp: , Rfl:  .  baclofen (LIORESAL) 10 MG tablet, Take 10 mg by mouth daily as needed. migraines, Disp: , Rfl:  .  denosumab (PROLIA) 60 MG/ML SOLN injection, Inject 60 mg into the skin every 6 (six) months. Administer in upper arm, thigh, or abdomen  Next injection is due 05-2016, Disp: , Rfl:  .  diazepam (VALIUM) 5 MG tablet, Take 5 mg by mouth daily as needed for anxiety. , Disp: , Rfl: 0 .  fluticasone (FLONASE) 50 MCG/ACT nasal spray, Place 2 sprays into both nostrils at bedtime., Disp: , Rfl:  .  Galcanezumab-gnlm (EMGALITY) 120 MG/ML SOSY, Inject 120 mg/mL  into the skin every 30 (thirty) days., Disp: , Rfl:  .  glyBURIDE-metformin (GLUCOVANCE) 2.5-500 MG per tablet, Take 2 tablets by mouth 2 (two) times daily., Disp: , Rfl:  .  GLYXAMBI 25-5 MG TABS, Take 1 tablet by mouth every morning., Disp: , Rfl: 3 .  hydroxypropyl methylcellulose / hypromellose (ISOPTO TEARS / GONIOVISC) 2.5 % ophthalmic solution, Place 1 drop into both eyes 3 (three) times daily., Disp: , Rfl:  .  metoprolol succinate (TOPROL-XL) 25 MG 24 hr tablet, Take 25 mg by mouth every morning., Disp: , Rfl:  .  Multiple Vitamin (MULTI-VITAMINS) TABS, Take by mouth., Disp: , Rfl:  .  omeprazole (PRILOSEC) 40 MG capsule, Take 40 mg by mouth 2 (two) times daily., Disp: , Rfl:  .  ONE TOUCH ULTRA TEST test strip, TEST BLOOD SUGAR ONCE DAILY, Disp: , Rfl: 3 .  ONETOUCH DELICA LANCETS 74Y MISC, TEST BLOOD SUGAR ONCE DAILY, Disp: , Rfl: 3 .  pioglitazone (ACTOS) 15 MG tablet, Take 15 mg by mouth daily., Disp: , Rfl:   Allergies:  Allergies  Allergen Reactions  . Ace Inhibitors Other (See Comments) and Anaphylaxis    Angioedema  . Aspirin Other (See Comments)    Cannot take due to gastric bypass surgery  . Nsaids Anaphylaxis and Other (See Comments)    S/p gastric bypass Cannot take  orally due to gastric bypass S/p gastric bypass  . Sular [Nisoldipine Er] Other (See Comments)    angioedema  . Boniva [Ibandronic Acid] Other (See Comments)    Joint pain    Past Medical History, Surgical history, Social history, and Family History were reviewed and updated.  Review of Systems: Review of Systems  Constitutional: Negative.   HENT: Negative.   Eyes: Negative.   Respiratory: Negative.   Cardiovascular: Negative.   Gastrointestinal: Negative.   Genitourinary: Negative.   Musculoskeletal: Negative.   Skin: Negative.   Neurological: Negative.   Endo/Heme/Allergies: Negative.   Psychiatric/Behavioral: Negative.     Physical Exam:  vitals were not taken for this visit.    Physical Exam Vitals signs reviewed.  HENT:     Head: Normocephalic and atraumatic.  Eyes:     Pupils: Pupils are equal, round, and reactive to light.  Neck:     Musculoskeletal: Normal range of motion.  Cardiovascular:     Rate and Rhythm: Normal rate and regular rhythm.     Heart sounds: Normal heart sounds.  Pulmonary:     Effort: Pulmonary effort is normal.     Breath sounds: Normal breath sounds.  Abdominal:     General: Bowel sounds are normal.     Palpations: Abdomen is soft.  Musculoskeletal: Normal range of motion.        General: No tenderness or deformity.  Lymphadenopathy:     Cervical: No cervical adenopathy.  Skin:    General: Skin is warm and dry.     Findings: No erythema or rash.  Neurological:     Mental Status: She is alert and oriented to person, place, and time.  Psychiatric:        Behavior: Behavior normal.        Thought Content: Thought content normal.        Judgment: Judgment normal.      Lab Results  Component Value Date   WBC 7.0 11/25/2018   HGB 10.3 (L) 11/25/2018   HCT 33.4 (L) 11/25/2018   MCV 94.1 11/25/2018   PLT 224 11/25/2018     Chemistry      Component Value Date/Time   NA 140 08/09/2018 0747   NA 145 05/10/2017 0748   NA 137 12/06/2016 0746   K 5.4 (H) 08/09/2018 0747   K 4.7 05/10/2017 0748   K 5.2 (H) 12/06/2016 0746   CL 106 08/09/2018 0747   CL 110 (H) 05/10/2017 0748   CO2 24 08/09/2018 0747   CO2 23 05/10/2017 0748   CO2 23 12/06/2016 0746   BUN 30 (H) 08/09/2018 0747   BUN 28 (H) 05/10/2017 0748   BUN 26.1 (H) 12/06/2016 0746   CREATININE 1.24 (H) 08/09/2018 0747   CREATININE 1.2 05/10/2017 0748   CREATININE 1.2 (H) 12/06/2016 0746      Component Value Date/Time   CALCIUM 9.8 08/09/2018 0747   CALCIUM 9.1 05/10/2017 0748   CALCIUM 9.1 12/06/2016 0746   ALKPHOS 48 08/09/2018 0747   ALKPHOS 61 05/10/2017 0748   ALKPHOS 41 12/06/2016 0746   AST 16 08/09/2018 0747   AST 23 12/06/2016 0746   ALT 20  08/09/2018 0747   ALT 31 05/10/2017 0748   ALT 26 12/06/2016 0746   BILITOT 0.3 08/09/2018 0747   BILITOT 0.26 12/06/2016 0746         Impression and Plan: Kelly Soto is 63 year old white female. She has diabetes. She has iron deficiency anemia. She has a  very low erythropoietin level.  We will clearly have to give her Aranesp today.  Clearly, it is her blood sugars that are going to be a problem.  I do think that if her blood sugars are not under better control, that she will develop a fatty liver and then be a risk for cirrhosis.  I will plan to see her back in 2 months.  Volanda Napoleon, MD 6/1/20208:15 AM

## 2018-11-26 ENCOUNTER — Encounter: Payer: Self-pay | Admitting: *Deleted

## 2019-01-09 ENCOUNTER — Ambulatory Visit
Admission: RE | Admit: 2019-01-09 | Discharge: 2019-01-09 | Disposition: A | Discharge status: Home / Self Care | Payer: BC Managed Care – PPO | Source: Ambulatory Visit | Attending: Family Medicine | Admitting: Family Medicine

## 2019-01-09 DIAGNOSIS — Z1231 Encounter for screening mammogram for malignant neoplasm of breast: Secondary | ICD-10-CM

## 2019-01-24 ENCOUNTER — Encounter: Payer: Self-pay | Admitting: Hematology & Oncology

## 2019-01-24 ENCOUNTER — Inpatient Hospital Stay: Payer: BC Managed Care – PPO

## 2019-01-24 ENCOUNTER — Inpatient Hospital Stay: Payer: BC Managed Care – PPO | Attending: Hematology & Oncology

## 2019-01-24 ENCOUNTER — Other Ambulatory Visit: Payer: Self-pay

## 2019-01-24 ENCOUNTER — Inpatient Hospital Stay (HOSPITAL_BASED_OUTPATIENT_CLINIC_OR_DEPARTMENT_OTHER): Payer: BC Managed Care – PPO | Admitting: Hematology & Oncology

## 2019-01-24 VITALS — BP 154/61 | HR 79 | Temp 97.3°F | Resp 18 | Wt 137.0 lb

## 2019-01-24 DIAGNOSIS — N182 Chronic kidney disease, stage 2 (mild): Secondary | ICD-10-CM

## 2019-01-24 DIAGNOSIS — D631 Anemia in chronic kidney disease: Secondary | ICD-10-CM | POA: Diagnosis present

## 2019-01-24 DIAGNOSIS — E1122 Type 2 diabetes mellitus with diabetic chronic kidney disease: Secondary | ICD-10-CM | POA: Diagnosis not present

## 2019-01-24 DIAGNOSIS — N189 Chronic kidney disease, unspecified: Secondary | ICD-10-CM

## 2019-01-24 DIAGNOSIS — D508 Other iron deficiency anemias: Secondary | ICD-10-CM

## 2019-01-24 DIAGNOSIS — D509 Iron deficiency anemia, unspecified: Secondary | ICD-10-CM

## 2019-01-24 DIAGNOSIS — D500 Iron deficiency anemia secondary to blood loss (chronic): Secondary | ICD-10-CM

## 2019-01-24 LAB — CMP (CANCER CENTER ONLY)
ALT: 18 U/L (ref 0–44)
AST: 17 U/L (ref 15–41)
Albumin: 3.9 g/dL (ref 3.5–5.0)
Alkaline Phosphatase: 44 U/L (ref 38–126)
Anion gap: 7 (ref 5–15)
BUN: 39 mg/dL — ABNORMAL HIGH (ref 8–23)
CO2: 25 mmol/L (ref 22–32)
Calcium: 9.1 mg/dL (ref 8.9–10.3)
Chloride: 104 mmol/L (ref 98–111)
Creatinine: 1.24 mg/dL — ABNORMAL HIGH (ref 0.44–1.00)
GFR, Est AFR Am: 53 mL/min — ABNORMAL LOW (ref >60)
GFR, Estimated: 46 mL/min — ABNORMAL LOW (ref >60)
Glucose, Bld: 219 mg/dL — ABNORMAL HIGH (ref 70–99)
Potassium: 5.7 mmol/L — ABNORMAL HIGH (ref 3.5–5.1)
Sodium: 136 mmol/L (ref 135–145)
Total Bilirubin: 0.2 mg/dL — ABNORMAL LOW (ref 0.3–1.2)
Total Protein: 6.5 g/dL (ref 6.5–8.1)

## 2019-01-24 LAB — RETICULOCYTES
Immature Retic Fract: 5.8 % (ref 2.3–15.9)
RBC.: 3.52 MIL/uL — ABNORMAL LOW (ref 3.87–5.11)
Retic Count, Absolute: 46.8 10*3/uL (ref 19.0–186.0)
Retic Ct Pct: 1.3 % (ref 0.4–3.1)

## 2019-01-24 LAB — CBC WITH DIFFERENTIAL (CANCER CENTER ONLY)
Abs Immature Granulocytes: 0.09 10*3/uL — ABNORMAL HIGH (ref 0.00–0.07)
Basophils Absolute: 0.1 10*3/uL (ref 0.0–0.1)
Basophils Relative: 1 %
Eosinophils Absolute: 0.1 10*3/uL (ref 0.0–0.5)
Eosinophils Relative: 1 %
HCT: 35.6 % — ABNORMAL LOW (ref 36.0–46.0)
Hemoglobin: 10.5 g/dL — ABNORMAL LOW (ref 12.0–15.0)
Immature Granulocytes: 1 %
Lymphocytes Relative: 16 %
Lymphs Abs: 1.2 10*3/uL (ref 0.7–4.0)
MCH: 29.1 pg (ref 26.0–34.0)
MCHC: 29.5 g/dL — ABNORMAL LOW (ref 30.0–36.0)
MCV: 98.6 fL (ref 80.0–100.0)
Monocytes Absolute: 0.5 10*3/uL (ref 0.1–1.0)
Monocytes Relative: 6 %
Neutro Abs: 5.7 10*3/uL (ref 1.7–7.7)
Neutrophils Relative %: 75 %
Platelet Count: 235 10*3/uL (ref 150–400)
RBC: 3.61 MIL/uL — ABNORMAL LOW (ref 3.87–5.11)
RDW: 13.9 % (ref 11.5–15.5)
WBC Count: 7.6 10*3/uL (ref 4.0–10.5)
nRBC: 0 % (ref 0.0–0.2)

## 2019-01-24 LAB — FERRITIN: Ferritin: 474 ng/mL — ABNORMAL HIGH (ref 11–307)

## 2019-01-24 LAB — IRON AND TIBC
Iron: 64 ug/dL (ref 41–142)
Saturation Ratios: 20 % — ABNORMAL LOW (ref 21–57)
TIBC: 324 ug/dL (ref 236–444)
UIBC: 260 ug/dL (ref 120–384)

## 2019-01-24 MED ORDER — DARBEPOETIN ALFA 300 MCG/0.6ML IJ SOSY
PREFILLED_SYRINGE | INTRAMUSCULAR | Status: AC
  Filled 2019-01-24: qty 0.6

## 2019-01-24 MED ORDER — DARBEPOETIN ALFA 300 MCG/0.6ML IJ SOSY
300.0000 ug | PREFILLED_SYRINGE | Freq: Once | INTRAMUSCULAR | Status: AC
  Administered 2019-01-24: 300 ug via SUBCUTANEOUS

## 2019-01-24 NOTE — Progress Notes (Signed)
Hematology and Oncology Follow Up Visit  Kelly Soto [id number] [birth year] 63 y.o. 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  She is doing pretty well.  She is managing the coronavirus without any problems.    So far, she has been managing okay.  Her diabetes is doing fairly well.  She says her blood sugar when she woke up this morning was 97.  She is busy trying to help train her new puppy.  He is a handful.  She certainly pictures.  He is very cute.  She has had no bleeding.  There is been no nausea or vomiting.  She has had no fever.  There is been no obvious change in bowel or bladder habits.  Her iron study we last saw her back in early June showed a ferritin of 529 with an iron saturation of 25%.  Overall, her performance status is ECOG 0.     Medications:  Current Outpatient Medications:  .  acetaminophen (TYLENOL) 500 MG chewable tablet, Chew 500-1,000 mg by mouth every 6 (six) hours as needed for pain (depends on pain level)., Disp: , Rfl:  .  baclofen (LIORESAL) 10 MG tablet, Take 10 mg by mouth daily as needed. migraines, Disp: , Rfl:  .  denosumab (PROLIA) 60 MG/ML SOLN injection, Inject 60 mg into the skin every 6 (six) months. Administer in upper arm, thigh, or abdomen  Next injection is due 05-2016, Disp: , Rfl:  .  diazepam (VALIUM) 5 MG tablet, Take 5 mg by mouth daily as needed for anxiety. , Disp: , Rfl: 0 .  fluticasone (FLONASE) 50 MCG/ACT nasal spray, Place 2 sprays into both nostrils at bedtime., Disp: , Rfl:  .  Galcanezumab-gnlm (EMGALITY) 120 MG/ML SOSY, Inject 120 mg/mL into the skin every 30 (thirty) days., Disp: , Rfl:  .   glyBURIDE-metformin (GLUCOVANCE) 2.5-500 MG per tablet, Take 2 tablets by mouth 2 (two) times daily., Disp: , Rfl:  .  GLYXAMBI 25-5 MG TABS, Take 1 tablet by mouth every morning., Disp: , Rfl: 3 .  hydroxypropyl methylcellulose / hypromellose (ISOPTO TEARS / GONIOVISC) 2.5 % ophthalmic solution, Place 1 drop into both eyes 3 (three) times daily., Disp: , Rfl:  .  metoprolol succinate (TOPROL-XL) 25 MG 24 hr tablet, Take 25 mg by mouth every morning., Disp: , Rfl:  .  Multiple Vitamin (MULTI-VITAMINS) TABS, Take by mouth., Disp: , Rfl:  .  omeprazole (PRILOSEC) 40 MG capsule, Take 40 mg by mouth 2 (two) times daily., Disp: , Rfl:  .  ONE TOUCH ULTRA TEST test strip, TEST BLOOD SUGAR ONCE DAILY, Disp: , Rfl: 3 .  ONETOUCH DELICA LANCETS 34K MISC, TEST BLOOD SUGAR ONCE DAILY, Disp: , Rfl: 3 .  pioglitazone (ACTOS) 15 MG tablet, Take 15 mg by mouth daily., Disp: , Rfl:  .  TRESIBA FLEXTOUCH 100 UNIT/ML SOPN FlexTouch Pen, Inject 10 Units into the skin daily., Disp: , Rfl:   Allergies:  Allergies  Allergen Reactions  . Ace Inhibitors Other (See Comments) and Anaphylaxis    Angioedema  . Aspirin Other (See Comments)    Cannot take due to gastric bypass surgery  . Nsaids  Anaphylaxis and Other (See Comments)    S/p gastric bypass Cannot take orally due to gastric bypass S/p gastric bypass  . Sular [Nisoldipine Er] Other (See Comments)    angioedema  . Boniva [Ibandronic Acid] Other (See Comments)    Joint pain    Past Medical History, Surgical history, Social history, and Family History were reviewed and updated.  Review of Systems: Review of Systems  Constitutional: Negative.   HENT: Negative.   Eyes: Negative.   Respiratory: Negative.   Cardiovascular: Negative.   Gastrointestinal: Negative.   Genitourinary: Negative.   Musculoskeletal: Negative.   Skin: Negative.   Neurological: Negative.   Endo/Heme/Allergies: Negative.   Psychiatric/Behavioral: Negative.     Physical  Exam:  weight is 137 lb (62.1 kg). Her oral temperature is 97.3 F (36.3 C) (abnormal). Her blood pressure is 154/61 (abnormal) and her pulse is 79. Her respiration is 18 and oxygen saturation is 100%.   Physical Exam Vitals signs reviewed.  HENT:     Head: Normocephalic and atraumatic.  Eyes:     Pupils: Pupils are equal, round, and reactive to light.  Neck:     Musculoskeletal: Normal range of motion.  Cardiovascular:     Rate and Rhythm: Normal rate and regular rhythm.     Heart sounds: Normal heart sounds.  Pulmonary:     Effort: Pulmonary effort is normal.     Breath sounds: Normal breath sounds.  Abdominal:     General: Bowel sounds are normal.     Palpations: Abdomen is soft.  Musculoskeletal: Normal range of motion.        General: No tenderness or deformity.  Lymphadenopathy:     Cervical: No cervical adenopathy.  Skin:    General: Skin is warm and dry.     Findings: No erythema or rash.  Neurological:     Mental Status: She is alert and oriented to person, place, and time.  Psychiatric:        Behavior: Behavior normal.        Thought Content: Thought content normal.        Judgment: Judgment normal.      Lab Results  Component Value Date   WBC 7.6 01/24/2019   HGB 10.5 (L) 01/24/2019   HCT 35.6 (L) 01/24/2019   MCV 98.6 01/24/2019   PLT 235 01/24/2019     Chemistry      Component Value Date/Time   NA 136 01/24/2019 0755   NA 145 05/10/2017 0748   NA 137 12/06/2016 0746   K 5.7 (H) 01/24/2019 0755   K 4.7 05/10/2017 0748   K 5.2 (H) 12/06/2016 0746   CL 104 01/24/2019 0755   CL 110 (H) 05/10/2017 0748   CO2 25 01/24/2019 0755   CO2 23 05/10/2017 0748   CO2 23 12/06/2016 0746   BUN 39 (H) 01/24/2019 0755   BUN 28 (H) 05/10/2017 0748   BUN 26.1 (H) 12/06/2016 0746   CREATININE 1.24 (H) 01/24/2019 0755   CREATININE 1.2 05/10/2017 0748   CREATININE 1.2 (H) 12/06/2016 0746      Component Value Date/Time   CALCIUM 9.1 01/24/2019 0755   CALCIUM  9.1 05/10/2017 0748   CALCIUM 9.1 12/06/2016 0746   ALKPHOS 44 01/24/2019 0755   ALKPHOS 61 05/10/2017 0748   ALKPHOS 41 12/06/2016 0746   AST 17 01/24/2019 0755   AST 23 12/06/2016 0746   ALT 18 01/24/2019 0755   ALT 31 05/10/2017 0748   ALT 26 12/06/2016  0746   BILITOT 0.2 (L) 01/24/2019 0755   BILITOT 0.26 12/06/2016 0746         Impression and Plan: Kelly Soto is 63 year old white female. She has diabetes. She has iron deficiency anemia. She has a very low erythropoietin level.  She does feel tired.  As such, we will go ahead and give her Aranesp today.  We will plan to get her back in a couple months.  I think this would be very reasonable.    Volanda Napoleon, MD 7/31/20208:37 AM

## 2019-01-24 NOTE — Patient Instructions (Signed)
Darbepoetin Alfa injection What is this medicine? DARBEPOETIN ALFA (dar be POE e tin AL fa) helps your body make more red blood cells. It is used to treat anemia caused by chronic kidney failure and chemotherapy. This medicine may be used for other purposes; ask your health care provider or pharmacist if you have questions. COMMON BRAND NAME(S): Aranesp What should I tell my health care provider before I take this medicine? They need to know if you have any of these conditions:  blood clotting disorders or history of blood clots  cancer patient not on chemotherapy  cystic fibrosis  heart disease, such as angina, heart failure, or a history of a heart attack  hemoglobin level of 12 g/dL or greater  high blood pressure  low levels of folate, iron, or vitamin B12  seizures  an unusual or allergic reaction to darbepoetin, erythropoietin, albumin, hamster proteins, latex, other medicines, foods, dyes, or preservatives  pregnant or trying to get pregnant  breast-feeding How should I use this medicine? This medicine is for injection into a vein or under the skin. It is usually given by a health care professional in a hospital or clinic setting. If you get this medicine at home, you will be taught how to prepare and give this medicine. Use exactly as directed. Take your medicine at regular intervals. Do not take your medicine more often than directed. It is important that you put your used needles and syringes in a special sharps container. Do not put them in a trash can. If you do not have a sharps container, call your pharmacist or healthcare provider to get one. A special MedGuide will be given to you by the pharmacist with each prescription and refill. Be sure to read this information carefully each time. Talk to your pediatrician regarding the use of this medicine in children. While this medicine may be used in children as young as 1 month of age for selected conditions, precautions do  apply. Overdosage: If you think you have taken too much of this medicine contact a poison control center or emergency room at once. NOTE: This medicine is only for you. Do not share this medicine with others. What if I miss a dose? If you miss a dose, take it as soon as you can. If it is almost time for your next dose, take only that dose. Do not take double or extra doses. What may interact with this medicine? Do not take this medicine with any of the following medications:  epoetin alfa This list may not describe all possible interactions. Give your health care provider a list of all the medicines, herbs, non-prescription drugs, or dietary supplements you use. Also tell them if you smoke, drink alcohol, or use illegal drugs. Some items may interact with your medicine. What should I watch for while using this medicine? Your condition will be monitored carefully while you are receiving this medicine. You may need blood work done while you are taking this medicine. This medicine may cause a decrease in vitamin B6. You should make sure that you get enough vitamin B6 while you are taking this medicine. Discuss the foods you eat and the vitamins you take with your health care professional. What side effects may I notice from receiving this medicine? Side effects that you should report to your doctor or health care professional as soon as possible:  allergic reactions like skin rash, itching or hives, swelling of the face, lips, or tongue  breathing problems  changes in   vision  chest pain  confusion, trouble speaking or understanding  feeling faint or lightheaded, falls  high blood pressure  muscle aches or pains  pain, swelling, warmth in the leg  rapid weight gain  severe headaches  sudden numbness or weakness of the face, arm or leg  trouble walking, dizziness, loss of balance or coordination  seizures (convulsions)  swelling of the ankles, feet, hands  unusually weak or  tired Side effects that usually do not require medical attention (report to your doctor or health care professional if they continue or are bothersome):  diarrhea  fever, chills (flu-like symptoms)  headaches  nausea, vomiting  redness, stinging, or swelling at site where injected This list may not describe all possible side effects. Call your doctor for medical advice about side effects. You may report side effects to FDA at 1-800-FDA-1088. Where should I keep my medicine? Keep out of the reach of children. Store in a refrigerator between 2 and 8 degrees C (36 and 46 degrees F). Do not freeze. Do not shake. Throw away any unused portion if using a single-dose vial. Throw away any unused medicine after the expiration date. NOTE: This sheet is a summary. It may not cover all possible information. If you have questions about this medicine, talk to your doctor, pharmacist, or health care provider.  2020 Elsevier/Gold Standard (2017-06-27 16:44:20)  

## 2019-01-28 ENCOUNTER — Inpatient Hospital Stay: Payer: BC Managed Care – PPO

## 2019-01-29 ENCOUNTER — Inpatient Hospital Stay: Payer: BC Managed Care – PPO | Attending: Hematology & Oncology

## 2019-01-29 ENCOUNTER — Other Ambulatory Visit: Payer: Self-pay | Admitting: Hematology & Oncology

## 2019-01-29 ENCOUNTER — Other Ambulatory Visit: Payer: Self-pay

## 2019-01-29 VITALS — BP 150/57 | HR 69 | Temp 97.7°F | Resp 18

## 2019-01-29 DIAGNOSIS — N189 Chronic kidney disease, unspecified: Secondary | ICD-10-CM

## 2019-01-29 DIAGNOSIS — D631 Anemia in chronic kidney disease: Secondary | ICD-10-CM | POA: Insufficient documentation

## 2019-01-29 MED ORDER — SODIUM CHLORIDE 0.9 % IV SOLN
510.0000 mg | Freq: Once | INTRAVENOUS | Status: AC
  Administered 2019-01-29: 510 mg via INTRAVENOUS
  Filled 2019-01-29: qty 510

## 2019-01-29 MED ORDER — SODIUM CHLORIDE 0.9 % IV SOLN
INTRAVENOUS | Status: DC
  Administered 2019-01-29: 08:00:00 via INTRAVENOUS
  Filled 2019-01-29: qty 250

## 2019-01-29 NOTE — Patient Instructions (Signed)
Ferumoxytol injection (Feraheme) What is this medicine? FERUMOXYTOL is an iron complex. Iron is used to make healthy red blood cells, which carry oxygen and nutrients throughout the body. This medicine is used to treat iron deficiency anemia. This medicine may be used for other purposes; ask your health care provider or pharmacist if you have questions. COMMON BRAND NAME(S): Feraheme What should I tell my health care provider before I take this medicine? They need to know if you have any of these conditions:  anemia not caused by low iron levels  high levels of iron in the blood  magnetic resonance imaging (MRI) test scheduled  an unusual or allergic reaction to iron, other medicines, foods, dyes, or preservatives  pregnant or trying to get pregnant  breast-feeding How should I use this medicine? This medicine is for injection into a vein. It is given by a health care professional in a hospital or clinic setting. Talk to your pediatrician regarding the use of this medicine in children. Special care may be needed. Overdosage: If you think you have taken too much of this medicine contact a poison control center or emergency room at once. NOTE: This medicine is only for you. Do not share this medicine with others. What if I miss a dose? It is important not to miss your dose. Call your doctor or health care professional if you are unable to keep an appointment. What may interact with this medicine? This medicine may interact with the following medications:  other iron products This list may not describe all possible interactions. Give your health care provider a list of all the medicines, herbs, non-prescription drugs, or dietary supplements you use. Also tell them if you smoke, drink alcohol, or use illegal drugs. Some items may interact with your medicine. What should I watch for while using this medicine? Visit your doctor or healthcare professional regularly. Tell your doctor or  healthcare professional if your symptoms do not start to get better or if they get worse. You may need blood work done while you are taking this medicine. You may need to follow a special diet. Talk to your doctor. Foods that contain iron include: whole grains/cereals, dried fruits, beans, or peas, leafy green vegetables, and organ meats (liver, kidney). What side effects may I notice from receiving this medicine? Side effects that you should report to your doctor or health care professional as soon as possible:  allergic reactions like skin rash, itching or hives, swelling of the face, lips, or tongue  breathing problems  changes in blood pressure  feeling faint or lightheaded, falls  fever or chills  flushing, sweating, or hot feelings  swelling of the ankles or feet Side effects that usually do not require medical attention (report to your doctor or health care professional if they continue or are bothersome):  diarrhea  headache  nausea, vomiting  stomach pain This list may not describe all possible side effects. Call your doctor for medical advice about side effects. You may report side effects to FDA at 1-800-FDA-1088. Where should I keep my medicine? This drug is given in a hospital or clinic and will not be stored at home. NOTE: This sheet is a summary. It may not cover all possible information. If you have questions about this medicine, talk to your doctor, pharmacist, or health care provider.  2020 Elsevier/Gold Standard (2016-07-31 20:21:10)  

## 2019-03-21 ENCOUNTER — Inpatient Hospital Stay: Payer: BC Managed Care – PPO

## 2019-03-21 ENCOUNTER — Inpatient Hospital Stay: Payer: BC Managed Care – PPO | Attending: Hematology & Oncology | Admitting: Hematology & Oncology

## 2019-03-21 ENCOUNTER — Encounter: Payer: Self-pay | Admitting: *Deleted

## 2019-03-21 ENCOUNTER — Encounter: Payer: Self-pay | Admitting: Hematology & Oncology

## 2019-03-21 ENCOUNTER — Telehealth: Payer: Self-pay | Admitting: Hematology & Oncology

## 2019-03-21 ENCOUNTER — Other Ambulatory Visit: Payer: Self-pay

## 2019-03-21 VITALS — BP 170/74 | HR 77 | Temp 97.3°F | Resp 18 | Wt 141.5 lb

## 2019-03-21 DIAGNOSIS — D500 Iron deficiency anemia secondary to blood loss (chronic): Secondary | ICD-10-CM | POA: Diagnosis not present

## 2019-03-21 DIAGNOSIS — Z794 Long term (current) use of insulin: Secondary | ICD-10-CM | POA: Diagnosis not present

## 2019-03-21 DIAGNOSIS — E119 Type 2 diabetes mellitus without complications: Secondary | ICD-10-CM | POA: Diagnosis not present

## 2019-03-21 DIAGNOSIS — D631 Anemia in chronic kidney disease: Secondary | ICD-10-CM | POA: Insufficient documentation

## 2019-03-21 DIAGNOSIS — D509 Iron deficiency anemia, unspecified: Secondary | ICD-10-CM | POA: Diagnosis present

## 2019-03-21 DIAGNOSIS — N189 Chronic kidney disease, unspecified: Secondary | ICD-10-CM | POA: Insufficient documentation

## 2019-03-21 DIAGNOSIS — N182 Chronic kidney disease, stage 2 (mild): Secondary | ICD-10-CM

## 2019-03-21 LAB — CMP (CANCER CENTER ONLY)
ALT: 20 U/L (ref 0–44)
AST: 19 U/L (ref 15–41)
Albumin: 4.1 g/dL (ref 3.5–5.0)
Alkaline Phosphatase: 47 U/L (ref 38–126)
Anion gap: 8 (ref 5–15)
BUN: 34 mg/dL — ABNORMAL HIGH (ref 8–23)
CO2: 26 mmol/L (ref 22–32)
Calcium: 9.6 mg/dL (ref 8.9–10.3)
Chloride: 106 mmol/L (ref 98–111)
Creatinine: 1.29 mg/dL — ABNORMAL HIGH (ref 0.44–1.00)
GFR, Est AFR Am: 51 mL/min — ABNORMAL LOW (ref >60)
GFR, Estimated: 44 mL/min — ABNORMAL LOW (ref >60)
Glucose, Bld: 217 mg/dL — ABNORMAL HIGH (ref 70–99)
Potassium: 5.4 mmol/L — ABNORMAL HIGH (ref 3.5–5.1)
Sodium: 140 mmol/L (ref 135–145)
Total Bilirubin: 0.3 mg/dL (ref 0.3–1.2)
Total Protein: 7.1 g/dL (ref 6.5–8.1)

## 2019-03-21 LAB — RETICULOCYTES
Immature Retic Fract: 3.7 % (ref 2.3–15.9)
RBC.: 4.15 MIL/uL (ref 3.87–5.11)
Retic Count, Absolute: 14.1 10*3/uL — ABNORMAL LOW (ref 19.0–186.0)
Retic Ct Pct: 0.3 % — ABNORMAL LOW (ref 0.4–3.1)

## 2019-03-21 LAB — IRON AND TIBC
Iron: 131 ug/dL (ref 41–142)
Saturation Ratios: 49 % (ref 21–57)
TIBC: 270 ug/dL (ref 236–444)
UIBC: 139 ug/dL (ref 120–384)

## 2019-03-21 LAB — CBC WITH DIFFERENTIAL (CANCER CENTER ONLY)
Abs Immature Granulocytes: 0.07 10*3/uL (ref 0.00–0.07)
Basophils Absolute: 0 10*3/uL (ref 0.0–0.1)
Basophils Relative: 1 %
Eosinophils Absolute: 0.1 10*3/uL (ref 0.0–0.5)
Eosinophils Relative: 1 %
HCT: 39.8 % (ref 36.0–46.0)
Hemoglobin: 12.1 g/dL (ref 12.0–15.0)
Immature Granulocytes: 1 %
Lymphocytes Relative: 19 %
Lymphs Abs: 1.4 10*3/uL (ref 0.7–4.0)
MCH: 28.7 pg (ref 26.0–34.0)
MCHC: 30.4 g/dL (ref 30.0–36.0)
MCV: 94.5 fL (ref 80.0–100.0)
Monocytes Absolute: 0.4 10*3/uL (ref 0.1–1.0)
Monocytes Relative: 6 %
Neutro Abs: 5.2 10*3/uL (ref 1.7–7.7)
Neutrophils Relative %: 72 %
Platelet Count: 199 10*3/uL (ref 150–400)
RBC: 4.21 MIL/uL (ref 3.87–5.11)
RDW: 14.8 % (ref 11.5–15.5)
WBC Count: 7.2 10*3/uL (ref 4.0–10.5)
nRBC: 0 % (ref 0.0–0.2)

## 2019-03-21 LAB — FERRITIN: Ferritin: 682 ng/mL — ABNORMAL HIGH (ref 11–307)

## 2019-03-21 NOTE — Telephone Encounter (Signed)
Spoke with patient to confirm appts per 9/25 LOS

## 2019-03-21 NOTE — Progress Notes (Signed)
Hematology and Oncology Follow Up Visit  Kelly Soto [id number] Apr 12, 1956 63 y.o. 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 is back for follow-up.  Kelly Soto is feeling better.  We last saw Kelly Soto, we actually did give Kelly Soto some IV iron.  Kelly Soto iron saturation was only 20%.  Kelly Soto and Kelly Soto [place]  That a wonderful time up in the mountains.  It was nice and cool for Kelly Soto.  Kelly Soto is doing well with Kelly Soto new puppy.  The pump is now 83 months old.  He weighs almost 60 pounds.  He has gained to be a handful.  Kelly Soto blood sugars have been fluctuating.  This morning, blood sugar was 217.  Kelly Soto had a country ham biscuit before Kelly Soto got to the office.  Kelly Soto says that sometimes at night, Kelly Soto blood sugar goes down to 50-60.  There is been no problems with bleeding.  Kelly Soto has had no issues with nausea or vomiting.  There is been no fever.  Kelly Soto has had no rashes.  There is been no leg swelling.  Overall, Kelly Soto performance status is ECOG 0.     Medications:  Current Outpatient Medications:  .  acetaminophen (TYLENOL) 500 MG chewable tablet, Chew 500-1,000 mg by mouth every 6 (six) hours as needed for pain (depends on pain level)., Disp: , Rfl:  .  baclofen (LIORESAL) 10 MG tablet, Take 10 mg by mouth daily as needed. migraines, Disp: , Rfl:  .  denosumab (PROLIA) 60 MG/ML SOLN injection, Inject 60 mg into the skin every 6 (six) months. Administer in upper arm, thigh, or abdomen  Next injection is due 05-2016, Disp: , Rfl:  .  diazepam (VALIUM) 5 MG tablet, Take 5 mg by mouth daily as needed for anxiety. , Disp: , Rfl: 0 .  fluticasone (FLONASE) 50 MCG/ACT nasal spray,  Place 2 sprays into both nostrils at bedtime., Disp: , Rfl:  .  Galcanezumab-gnlm (EMGALITY) 120 MG/ML SOSY, Inject 120 mg/mL into the skin every 30 (thirty) days., Disp: , Rfl:  .  glyBURIDE-metformin (GLUCOVANCE) 2.5-500 MG per tablet, Take 2 tablets by mouth 2 (two) times daily., Disp: , Rfl:  .  GLYXAMBI 25-5 MG TABS, Take 1 tablet by mouth every morning., Disp: , Rfl: 3 .  hydroxypropyl methylcellulose / hypromellose (ISOPTO TEARS / GONIOVISC) 2.5 % ophthalmic solution, Place 1 drop into both eyes 3 (three) times daily., Disp: , Rfl:  .  metoprolol succinate (TOPROL-XL) 25 MG 24 hr tablet, Take 25 mg by mouth every morning., Disp: , Rfl:  .  Multiple Vitamin (MULTI-VITAMINS) TABS, Take by mouth., Disp: , Rfl:  .  omeprazole (PRILOSEC) 40 MG capsule, Take 40 mg by mouth 2 (two) times daily., Disp: , Rfl:  .  ONE TOUCH ULTRA TEST test strip, TEST BLOOD SUGAR ONCE DAILY, Disp: , Rfl: 3 .  ONETOUCH DELICA LANCETS 60V MISC, TEST BLOOD SUGAR ONCE DAILY, Disp: , Rfl: 3 .  pioglitazone (ACTOS) 15 MG tablet, Take 15 mg by mouth daily., Disp: , Rfl:  .  TRESIBA FLEXTOUCH 100 UNIT/ML SOPN FlexTouch Pen, Inject 10 Units into the skin daily., Disp: , Rfl:  Allergies:  Allergies  Allergen Reactions  . Ace Inhibitors Other (See Comments) and Anaphylaxis    Angioedema  . Aspirin Other (See Comments)    Cannot take due to gastric bypass surgery  . Nsaids Anaphylaxis and Other (See Comments)    S/p gastric bypass Cannot take orally due to gastric bypass S/p gastric bypass  . Sular [Nisoldipine Er] Other (See Comments)    angioedema  . Boniva [Ibandronic Acid] Other (See Comments)    Joint pain    Past Medical History, Surgical history, Social history, and Family History were reviewed and updated.  Review of Systems: Review of Systems  Constitutional: Negative.   HENT: Negative.   Eyes: Negative.   Respiratory: Negative.   Cardiovascular: Negative.   Gastrointestinal: Negative.    Genitourinary: Negative.   Musculoskeletal: Negative.   Skin: Negative.   Neurological: Negative.   Endo/Heme/Allergies: Negative.   Psychiatric/Behavioral: Negative.     Physical Exam:  vitals were not taken for this visit.   Physical Exam Vitals signs reviewed.  HENT:     Head: Normocephalic and atraumatic.  Eyes:     Pupils: Pupils are equal, round, and reactive to light.  Neck:     Musculoskeletal: Normal range of motion.  Cardiovascular:     Rate and Rhythm: Normal rate and regular rhythm.     Heart sounds: Normal heart sounds.  Pulmonary:     Effort: Pulmonary effort is normal.     Breath sounds: Normal breath sounds.  Abdominal:     General: Bowel sounds are normal.     Palpations: Abdomen is soft.  Musculoskeletal: Normal range of motion.        General: No tenderness or deformity.  Lymphadenopathy:     Cervical: No cervical adenopathy.  Skin:    General: Skin is warm and dry.     Findings: No erythema or rash.  Neurological:     Mental Status: Kelly Soto is alert and oriented to person, place, and time.  Psychiatric:        Behavior: Behavior normal.        Thought Content: Thought content normal.        Judgment: Judgment normal.      Lab Results  Component Value Date   WBC 7.6 01/24/2019   HGB 10.5 (L) 01/24/2019   HCT 35.6 (L) 01/24/2019   MCV 98.6 01/24/2019   PLT 235 01/24/2019     Chemistry      Component Value Date/Time   NA 136 01/24/2019 0755   NA 145 05/10/2017 0748   NA 137 12/06/2016 0746   K 5.7 (H) 01/24/2019 0755   K 4.7 05/10/2017 0748   K 5.2 (H) 12/06/2016 0746   CL 104 01/24/2019 0755   CL 110 (H) 05/10/2017 0748   CO2 25 01/24/2019 0755   CO2 23 05/10/2017 0748   CO2 23 12/06/2016 0746   BUN 39 (H) 01/24/2019 0755   BUN 28 (H) 05/10/2017 0748   BUN 26.1 (H) 12/06/2016 0746   CREATININE 1.24 (H) 01/24/2019 0755   CREATININE 1.2 05/10/2017 0748   CREATININE 1.2 (H) 12/06/2016 0746      Component Value Date/Time   CALCIUM  9.1 01/24/2019 0755   CALCIUM 9.1 05/10/2017 0748   CALCIUM 9.1 12/06/2016 0746   ALKPHOS 44 01/24/2019 0755   ALKPHOS 61 05/10/2017 0748   ALKPHOS 41 12/06/2016 0746   AST 17 01/24/2019 0755   AST 23 12/06/2016 0746   ALT 18 01/24/2019 0755  ALT 31 05/10/2017 0748   ALT 26 12/06/2016 0746   BILITOT 0.2 (L) 01/24/2019 0755   BILITOT 0.26 12/06/2016 0746         Impression and Plan: Ms. Ramo is 63 year old white female. Kelly Soto has diabetes. Kelly Soto has iron deficiency anemia. Kelly Soto has a very low erythropoietin level.  For right now, Kelly Soto does not need Aranesp.  I would be surprised if Kelly Soto iron is going be a problem.  We will plan to get Kelly Soto back to see [place] right before the holidays.  I want to make sure that Kelly Soto blood is going be okay for the holiday season as Kelly Soto is quite busy with Kelly Soto family.    Volanda Napoleon, MD 9/25/20208:02 AM

## 2019-04-09 ENCOUNTER — Ambulatory Visit: Payer: BC Managed Care – PPO

## 2019-04-09 ENCOUNTER — Encounter: Payer: Self-pay | Admitting: Orthopedic Surgery

## 2019-04-09 ENCOUNTER — Other Ambulatory Visit: Payer: Self-pay

## 2019-04-09 ENCOUNTER — Ambulatory Visit (INDEPENDENT_AMBULATORY_CARE_PROVIDER_SITE_OTHER): Payer: BC Managed Care – PPO | Admitting: Orthopedic Surgery

## 2019-04-09 VITALS — Ht 61.0 in | Wt 132.0 lb

## 2019-04-09 DIAGNOSIS — M25461 Effusion, right knee: Secondary | ICD-10-CM | POA: Diagnosis not present

## 2019-04-09 DIAGNOSIS — M25561 Pain in right knee: Secondary | ICD-10-CM | POA: Diagnosis not present

## 2019-04-09 NOTE — Progress Notes (Signed)
Office Visit Note   Patient: Kelly Soto           Date of Birth: [birth year]           MRN: [id number] Visit Date: 04/09/2019 Requested by: [name], MD [address] [place],  [place] [address] PCP: [name], MD  Subjective: Chief Complaint  Patient presents with   Right Knee - Pain    HPI: Kelly Soto is a 63 y.o. female who presents to the office complaining of right knee pain.  Patient notes that she injured her right knee about a month ago while picking up [employer].  She cannot recall specific injury but notes that she woke up the next day with pain.  She localizes her pain to the posterior medial aspect of the knee.  It improved somewhat but for the past couple weeks has plateaued.  She has not been able to bend over without a sharp pain in her right knee.  She is able to weight-bear but with pain.  She denies any locking of the knee but notes catching.  She has been taking Tylenol as needed and cannot take NSAIDs due to an allergy.  She has no previous history of right knee pain prior to this episode.  She has never had surgery on her knee.  She has not had any recent injection in the knee.  Pain does not wake her up..  She has a history of diabetes with her most recent A1c being between 7 and 8 according to patient.              ROS:  All systems reviewed are negative as they relate to the chief complaint within the history of present illness.  Patient denies fevers or chills.  Assessment & Plan: Visit Diagnoses:  1. Medial joint line tenderness of knee, right   2. Effusion, right knee     Plan: Patient is a 63 year old female who presents with 4-week history of right knee pain.  Has not had any pain prior to this event.  She associates the pain with bending over to pick things up.  She has significant tenderness on exam as well as an altered, antalgic gait.  She has a positive Thessaly test and an effusion on exam.  Focal medial joint line  tenderness is present impression is likely medial meniscal tear.  X-rays are negative.  Ordered MRI of the right knee to evaluate for medial meniscal tear.  Also offered right knee injection, patient agreed.  Patient tolerated the procedure well and will follow-up after MRI to review results.  High likelihood of meniscal pathology based on knee effusion mechanical symptoms joint line tenderness and relatively well-preserved medial joint space on radiographs.  Follow-Up Instructions: No follow-ups on file.   Orders:  Orders Placed This Encounter  Procedures   XR KNEE 3 VIEW RIGHT   MR Knee Right w/o contrast   No orders of the defined types were placed in this encounter.     Procedures: Large Joint Inj: R knee on 04/09/2019 8:08 PM Indications: diagnostic evaluation, joint swelling and pain Details: 18 G 1.5 in needle, superolateral approach  Arthrogram: No  Medications: 4 mL bupivacaine 0.25 %; 5 mL lidocaine 1 %; 40 mg methylPREDNISolone acetate 40 MG/ML Aspirate: serous Outcome: tolerated well, no immediate complications Procedure, treatment alternatives, risks and benefits explained, specific risks discussed. Consent was given by the patient. Immediately prior to procedure a time out was called to verify  the correct patient, procedure, equipment, support staff and site/side marked as required. Patient was prepped and draped in the usual sterile fashion.       Clinical Data: No additional findings.  Objective: Vital Signs: Ht 5\' 1"  (1.549 m)    Wt 132 lb (59.9 kg)    BMI 24.94 kg/m   Physical Exam:  Constitutional: Patient appears well-developed HEENT:  Head: Normocephalic Eyes:EOM are normal Neck: Normal range of motion Cardiovascular: Normal rate Pulmonary/chest: Effort normal Neurologic: Patient is alert Skin: Skin is warm Psychiatric: Patient has normal mood and affect  Ortho Exam:  Right knee Exam Mild effusion Extensor mechanism intact No TTP over the  lateral jointlines, quad tendon, patellar tendon, pes anserinus, patella, tibial tubercle, LCL/MCL insertions TTP over the medial joint line Positive Thessaly test Stable to varus/valgus stresses.  Stable to anterior/posterior drawer Extension to 0 degrees Flexion > 90 degrees  Specialty Comments:  No specialty comments available.  Imaging: No results found.   PMFS History: Patient Active Problem List   Diagnosis Date Noted   Chronic renal insufficiency 09/24/2013   Anemia of renal disease 09/24/2013   H/O gastric bypass 08/22/2013   Intestinal malabsorption 08/22/2013   Anemia, iron deficiency 08/22/2013   Chronic eustachian tube dysfunction 03/28/2011   Diabetes mellitus ([place]) 03/28/2011   Past Medical History:  Diagnosis Date   Anemia of renal disease 09/24/2013   Chronic renal insufficiency 09/24/2013   Diabetes mellitus without complication (HCC)    Headache    migrains   History of hiatal hernia    Hypertension    PONV (postoperative nausea and vomiting)    Ulcer     Family History  Problem Relation Age of Onset   Atrial fibrillation Mother    Heart attack Father    Sudden Cardiac Death Father    Stroke Maternal Grandmother     Past Surgical History:  Procedure Laterality Date   ABDOMINAL HYSTERECTOMY  1992   BREAST BIOPSY Left 03/26/2017   benign   CARPAL TUNNEL RELEASE Bilateral    CHOLECYSTECTOMY N/A 05/11/2016   Procedure: LAPAROSCOPIC CHOLECYSTECTOMY;  Surgeon: Erroll Luna, MD;  Location: [facility];  Service: General;  Laterality: N/A;   GASTRIC BYPASS  2009   Social History   Occupational History   Not on file  Tobacco Use   Smoking status: Never Smoker   Smokeless tobacco: Never Used   Tobacco comment: never used tobacco  Substance and Sexual Activity   Alcohol use: No    Alcohol/week: 0.0 standard drinks   Drug use: No   Sexual activity: Yes

## 2019-04-11 ENCOUNTER — Encounter: Payer: Self-pay | Admitting: Orthopedic Surgery

## 2019-04-11 MED ORDER — METHYLPREDNISOLONE ACETATE 40 MG/ML IJ SUSP
40.0000 mg | INTRAMUSCULAR | Status: AC | PRN
  Administered 2019-04-09: 40 mg via INTRA_ARTICULAR

## 2019-04-11 MED ORDER — LIDOCAINE HCL 1 % IJ SOLN
5.0000 mL | INTRAMUSCULAR | Status: AC | PRN
  Administered 2019-04-09: 5 mL

## 2019-04-11 MED ORDER — BUPIVACAINE HCL 0.25 % IJ SOLN
4.0000 mL | INTRAMUSCULAR | Status: AC | PRN
  Administered 2019-04-09: 4 mL via INTRA_ARTICULAR

## 2019-04-14 ENCOUNTER — Other Ambulatory Visit: Payer: BC Managed Care – PPO

## 2019-04-15 ENCOUNTER — Ambulatory Visit
Admission: RE | Admit: 2019-04-15 | Discharge: 2019-04-15 | Disposition: A | Discharge status: Home / Self Care | Payer: BC Managed Care – PPO | Source: Ambulatory Visit | Attending: Orthopedic Surgery | Admitting: Orthopedic Surgery

## 2019-04-15 ENCOUNTER — Other Ambulatory Visit: Payer: Self-pay

## 2019-04-15 DIAGNOSIS — M25561 Pain in right knee: Secondary | ICD-10-CM

## 2019-04-22 ENCOUNTER — Other Ambulatory Visit: Payer: Self-pay | Admitting: Gastroenterology

## 2019-04-23 ENCOUNTER — Other Ambulatory Visit: Payer: BC Managed Care – PPO

## 2019-04-24 ENCOUNTER — Ambulatory Visit (INDEPENDENT_AMBULATORY_CARE_PROVIDER_SITE_OTHER): Payer: BC Managed Care – PPO | Admitting: Orthopedic Surgery

## 2019-04-24 ENCOUNTER — Other Ambulatory Visit: Payer: Self-pay

## 2019-04-24 ENCOUNTER — Encounter: Payer: Self-pay | Admitting: Orthopedic Surgery

## 2019-04-24 DIAGNOSIS — M25461 Effusion, right knee: Secondary | ICD-10-CM

## 2019-04-24 NOTE — Progress Notes (Signed)
Office Visit Note   Patient: Kelly Soto           Date of Birth: 09-24-1955           MRN: [id number] Visit Date: 04/24/2019 Requested by: [name], MD [address] [place],  [place] [address] PCP: [name], MD  Subjective: Chief Complaint  Patient presents with  . Follow-up    HPI: Kelly Soto is a patient with right knee pain.  Since we have seen her she is had an MRI scan which shows a chondral defect on the medial femoral condyle as well as a 50% thickness posterior horn medial meniscal tear.  In general she is 90% better after the aspiration and injection.  She stopped walking for exercise due to the pain.  She does like to take the dog for walk down to the park.  Not taking anything other than Tylenol because anti-inflammatories cannot be useful because of gastric bypass.              ROS: All systems reviewed are negative as they relate to the chief complaint within the history of present illness.  Patient denies  fevers or chills.   Assessment & Plan: Visit Diagnoses:  1. Effusion, right knee     Plan: Impression is chondral defect in the right knee meniscal tear.  Clinically she is much improved with aspiration and injection.  Counseled her as to the need to avoid walking for exercise but instead focus on nonloadbearing quad strengthening exercises such as stationary bike.  I think is fine for her to take the dog for a walk down to the park but for fitness goals I would recommend nonloadbearing exercises.  I do want a preapprove her for gel injection as that would be the next step if she has recurrent symptoms.  I think arthroscopy and microfracture of that chondral defect could also be considered if all these other measures fail to give her the count of function that she wants out of the knee.  Partial knee replacement would be the final step for Kelly Soto.  Follow-Up Instructions: Return if symptoms worsen or fail to improve.   Orders:  No orders  of the defined types were placed in this encounter.  No orders of the defined types were placed in this encounter.     Procedures: No procedures performed   Clinical Data: No additional findings.  Objective: Vital Signs: There were no vitals taken for this visit.  Physical Exam:   Constitutional: Patient appears well-developed HEENT:  Head: Normocephalic Eyes:EOM are normal Neck: Normal range of motion Cardiovascular: Normal rate Pulmonary/chest: Effort normal Neurologic: Patient is alert Skin: Skin is warm Psychiatric: Patient has normal mood and affect    Ortho Exam: Ortho exam demonstrates normal gait alignment with full range of motion of the right knee.  No effusion.  No real joint line tenderness.  Pedal pulses palpable.  No other masses lymphadenopathy or skin changes noted in that right knee region.  This patient is diagnosed with osteoarthritis of the knee(s).    Radiographs show evidence of joint space narrowing, osteophytes, subchondral sclerosis and/or subchondral cysts.  This patient has knee pain which interferes with functional and activities of daily living.    This patient has experienced inadequate response, adverse effects and/or intolerance with conservative treatments such as acetaminophen, NSAIDS, topical creams, physical therapy or regular exercise, knee bracing and/or weight loss.   This patient has experienced inadequate response or has a contraindication  to intra articular steroid injections for at least 3 months.   This patient is not scheduled to have a total knee replacement within 6 months of starting treatment with viscosupplementation.   Specialty Comments:  No specialty comments available.  Imaging: No results found.   PMFS History: Patient Active Problem List   Diagnosis Date Noted  . Chronic renal insufficiency 09/24/2013  . Anemia of renal disease 09/24/2013  . H/O gastric bypass 08/22/2013  . Intestinal malabsorption  08/22/2013  . Anemia, iron deficiency 08/22/2013  . Chronic eustachian tube dysfunction 03/28/2011  . Diabetes mellitus ([place]) 03/28/2011   Past Medical History:  Diagnosis Date  . Anemia of renal disease 09/24/2013  . Chronic renal insufficiency 09/24/2013  . Diabetes mellitus without complication ([place] [facility])   . Headache    migrains  . History of hiatal hernia   . Hypertension   . PONV (postoperative nausea and vomiting)   . Ulcer     Family History  Problem Relation Age of Onset  . Atrial fibrillation Mother   . Heart attack Father   . Sudden Cardiac Death Father   . Stroke Maternal Grandmother     Past Surgical History:  Procedure Laterality Date  . ABDOMINAL HYSTERECTOMY  1992  . BREAST BIOPSY Left 03/26/2017   benign  . CARPAL TUNNEL RELEASE Bilateral   . CHOLECYSTECTOMY N/A 05/11/2016   Procedure: LAPAROSCOPIC CHOLECYSTECTOMY;  Surgeon: Erroll Luna, MD;  Location: [facility];  Service: General;  Laterality: N/A;  . GASTRIC BYPASS  2009   Social History   Occupational History  . Not on file  Tobacco Use  . Smoking status: Never Smoker  . Smokeless tobacco: Never Used  . Tobacco comment: never used tobacco  Substance and Sexual Activity  . Alcohol use: No    Alcohol/week: 0.0 standard drinks  . Drug use: No  . Sexual activity: Yes

## 2019-04-25 ENCOUNTER — Telehealth: Payer: Self-pay

## 2019-04-25 NOTE — Telephone Encounter (Signed)
Can we get auth for bilat gel injections?

## 2019-04-29 NOTE — Telephone Encounter (Signed)
Submitted online [id number], Durolane bil. Pending VOB.

## 2019-04-30 NOTE — Telephone Encounter (Signed)
Faxed to [facility] today for PA.

## 2019-05-02 ENCOUNTER — Other Ambulatory Visit: Payer: Self-pay | Admitting: Family

## 2019-05-02 NOTE — Telephone Encounter (Signed)
I called to followup and form was not received, faxed again today.

## 2019-05-06 ENCOUNTER — Other Ambulatory Visit: Payer: Self-pay | Admitting: Gastroenterology

## 2019-05-06 DIAGNOSIS — K769 Liver disease, unspecified: Secondary | ICD-10-CM

## 2019-05-07 NOTE — Telephone Encounter (Signed)
Can you please call patient and schedule an appt with Dr Marlou Sa for bilateral Durolane injections?  Whenever she wants, thanks.   Buy and bill ok, [place] #[id number], valid 05/02/19 thru 05/01/20, 120 units.

## 2019-05-09 ENCOUNTER — Encounter: Payer: Self-pay | Admitting: Hematology & Oncology

## 2019-05-09 ENCOUNTER — Inpatient Hospital Stay: Payer: BC Managed Care – PPO | Attending: Hematology & Oncology | Admitting: Hematology & Oncology

## 2019-05-09 ENCOUNTER — Inpatient Hospital Stay: Payer: BC Managed Care – PPO

## 2019-05-09 ENCOUNTER — Encounter: Payer: Self-pay | Admitting: *Deleted

## 2019-05-09 ENCOUNTER — Other Ambulatory Visit: Payer: Self-pay

## 2019-05-09 VITALS — BP 184/78 | HR 75 | Temp 97.1°F | Resp 18 | Wt 138.0 lb

## 2019-05-09 DIAGNOSIS — D631 Anemia in chronic kidney disease: Secondary | ICD-10-CM

## 2019-05-09 DIAGNOSIS — K9041 Non-celiac gluten sensitivity: Secondary | ICD-10-CM

## 2019-05-09 DIAGNOSIS — N189 Chronic kidney disease, unspecified: Secondary | ICD-10-CM | POA: Diagnosis present

## 2019-05-09 DIAGNOSIS — N182 Chronic kidney disease, stage 2 (mild): Secondary | ICD-10-CM

## 2019-05-09 DIAGNOSIS — D500 Iron deficiency anemia secondary to blood loss (chronic): Secondary | ICD-10-CM

## 2019-05-09 LAB — CBC WITH DIFFERENTIAL (CANCER CENTER ONLY)
Abs Immature Granulocytes: 0.05 10*3/uL (ref 0.00–0.07)
Basophils Absolute: 0 10*3/uL (ref 0.0–0.1)
Basophils Relative: 1 %
Eosinophils Absolute: 0.1 10*3/uL (ref 0.0–0.5)
Eosinophils Relative: 1 %
HCT: 33.9 % — ABNORMAL LOW (ref 36.0–46.0)
Hemoglobin: 10.4 g/dL — ABNORMAL LOW (ref 12.0–15.0)
Immature Granulocytes: 1 %
Lymphocytes Relative: 18 %
Lymphs Abs: 1.4 10*3/uL (ref 0.7–4.0)
MCH: 29.2 pg (ref 26.0–34.0)
MCHC: 30.7 g/dL (ref 30.0–36.0)
MCV: 95.2 fL (ref 80.0–100.0)
Monocytes Absolute: 0.6 10*3/uL (ref 0.1–1.0)
Monocytes Relative: 8 %
Neutro Abs: 5.4 10*3/uL (ref 1.7–7.7)
Neutrophils Relative %: 71 %
Platelet Count: 249 10*3/uL (ref 150–400)
RBC: 3.56 MIL/uL — ABNORMAL LOW (ref 3.87–5.11)
RDW: 15.9 % — ABNORMAL HIGH (ref 11.5–15.5)
WBC Count: 7.6 10*3/uL (ref 4.0–10.5)
nRBC: 0 % (ref 0.0–0.2)

## 2019-05-09 LAB — CMP (CANCER CENTER ONLY)
ALT: 16 U/L (ref 0–44)
AST: 15 U/L (ref 15–41)
Albumin: 4.5 g/dL (ref 3.5–5.0)
Alkaline Phosphatase: 42 U/L (ref 38–126)
Anion gap: 9 (ref 5–15)
BUN: 31 mg/dL — ABNORMAL HIGH (ref 8–23)
CO2: 25 mmol/L (ref 22–32)
Calcium: 10.1 mg/dL (ref 8.9–10.3)
Chloride: 106 mmol/L (ref 98–111)
Creatinine: 1.33 mg/dL — ABNORMAL HIGH (ref 0.44–1.00)
GFR, Est AFR Am: 49 mL/min — ABNORMAL LOW (ref >60)
GFR, Estimated: 42 mL/min — ABNORMAL LOW (ref >60)
Glucose, Bld: 145 mg/dL — ABNORMAL HIGH (ref 70–99)
Potassium: 5 mmol/L (ref 3.5–5.1)
Sodium: 140 mmol/L (ref 135–145)
Total Bilirubin: 0.3 mg/dL (ref 0.3–1.2)
Total Protein: 7.1 g/dL (ref 6.5–8.1)

## 2019-05-09 LAB — IRON AND TIBC
Iron: 68 ug/dL (ref 41–142)
Saturation Ratios: 23 % (ref 21–57)
TIBC: 299 ug/dL (ref 236–444)
UIBC: 230 ug/dL (ref 120–384)

## 2019-05-09 LAB — FERRITIN: Ferritin: 740 ng/mL — ABNORMAL HIGH (ref 11–307)

## 2019-05-09 MED ORDER — DARBEPOETIN ALFA 300 MCG/0.6ML IJ SOSY
PREFILLED_SYRINGE | INTRAMUSCULAR | Status: AC
  Filled 2019-05-09: qty 0.6

## 2019-05-09 MED ORDER — DARBEPOETIN ALFA 300 MCG/0.6ML IJ SOSY
300.0000 ug | PREFILLED_SYRINGE | Freq: Once | INTRAMUSCULAR | Status: AC
  Administered 2019-05-09: 300 ug via SUBCUTANEOUS

## 2019-05-09 NOTE — Patient Instructions (Signed)
Darbepoetin Alfa injection What is this medicine? DARBEPOETIN ALFA (dar be POE e tin AL fa) helps your body make more red blood cells. It is used to treat anemia caused by chronic kidney failure and chemotherapy. This medicine may be used for other purposes; ask your health care provider or pharmacist if you have questions. COMMON BRAND NAME(S): Aranesp What should I tell my health care provider before I take this medicine? They need to know if you have any of these conditions:  blood clotting disorders or history of blood clots  cancer patient not on chemotherapy  cystic fibrosis  heart disease, such as angina, heart failure, or a history of a heart attack  hemoglobin level of 12 g/dL or greater  high blood pressure  low levels of folate, iron, or vitamin B12  seizures  an unusual or allergic reaction to darbepoetin, erythropoietin, albumin, hamster proteins, latex, other medicines, foods, dyes, or preservatives  pregnant or trying to get pregnant  breast-feeding How should I use this medicine? This medicine is for injection into a vein or under the skin. It is usually given by a health care professional in a hospital or clinic setting. If you get this medicine at home, you will be taught how to prepare and give this medicine. Use exactly as directed. Take your medicine at regular intervals. Do not take your medicine more often than directed. It is important that you put your used needles and syringes in a special sharps container. Do not put them in a trash can. If you do not have a sharps container, call your pharmacist or healthcare provider to get one. A special MedGuide will be given to you by the pharmacist with each prescription and refill. Be sure to read this information carefully each time. Talk to your pediatrician regarding the use of this medicine in children. While this medicine may be used in children as young as 1 month of age for selected conditions, precautions do  apply. Overdosage: If you think you have taken too much of this medicine contact a poison control center or emergency room at once. NOTE: This medicine is only for you. Do not share this medicine with others. What if I miss a dose? If you miss a dose, take it as soon as you can. If it is almost time for your next dose, take only that dose. Do not take double or extra doses. What may interact with this medicine? Do not take this medicine with any of the following medications:  epoetin alfa This list may not describe all possible interactions. Give your health care provider a list of all the medicines, herbs, non-prescription drugs, or dietary supplements you use. Also tell them if you smoke, drink alcohol, or use illegal drugs. Some items may interact with your medicine. What should I watch for while using this medicine? Your condition will be monitored carefully while you are receiving this medicine. You may need blood work done while you are taking this medicine. This medicine may cause a decrease in vitamin B6. You should make sure that you get enough vitamin B6 while you are taking this medicine. Discuss the foods you eat and the vitamins you take with your health care professional. What side effects may I notice from receiving this medicine? Side effects that you should report to your doctor or health care professional as soon as possible:  allergic reactions like skin rash, itching or hives, swelling of the face, lips, or tongue  breathing problems  changes in   vision  chest pain  confusion, trouble speaking or understanding  feeling faint or lightheaded, falls  high blood pressure  muscle aches or pains  pain, swelling, warmth in the leg  rapid weight gain  severe headaches  sudden numbness or weakness of the face, arm or leg  trouble walking, dizziness, loss of balance or coordination  seizures (convulsions)  swelling of the ankles, feet, hands  unusually weak or  tired Side effects that usually do not require medical attention (report to your doctor or health care professional if they continue or are bothersome):  diarrhea  fever, chills (flu-like symptoms)  headaches  nausea, vomiting  redness, stinging, or swelling at site where injected This list may not describe all possible side effects. Call your doctor for medical advice about side effects. You may report side effects to FDA at 1-800-FDA-1088. Where should I keep my medicine? Keep out of the reach of children. Store in a refrigerator between 2 and 8 degrees C (36 and 46 degrees F). Do not freeze. Do not shake. Throw away any unused portion if using a single-dose vial. Throw away any unused medicine after the expiration date. NOTE: This sheet is a summary. It may not cover all possible information. If you have questions about this medicine, talk to your doctor, pharmacist, or health care provider.  2020 Elsevier/Gold Standard (2017-06-27 16:44:20)  
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
## 2019-05-19 ENCOUNTER — Ambulatory Visit: Payer: BC Managed Care – PPO | Admitting: Orthopedic Surgery

## 2019-05-19 ENCOUNTER — Telehealth: Payer: Self-pay | Admitting: Orthopedic Surgery

## 2019-05-19 ENCOUNTER — Encounter: Payer: Self-pay | Admitting: Orthopedic Surgery

## 2019-05-19 ENCOUNTER — Ambulatory Visit (INDEPENDENT_AMBULATORY_CARE_PROVIDER_SITE_OTHER): Payer: BC Managed Care – PPO | Admitting: Orthopedic Surgery

## 2019-05-19 ENCOUNTER — Other Ambulatory Visit: Payer: Self-pay

## 2019-05-19 DIAGNOSIS — M23321 Other meniscus derangements, posterior horn of medial meniscus, right knee: Secondary | ICD-10-CM | POA: Diagnosis not present

## 2019-05-19 DIAGNOSIS — M1711 Unilateral primary osteoarthritis, right knee: Secondary | ICD-10-CM | POA: Diagnosis not present

## 2019-05-19 DIAGNOSIS — M2410 Other articular cartilage disorders, unspecified site: Secondary | ICD-10-CM

## 2019-05-19 NOTE — Telephone Encounter (Signed)
Dr Marlou Sa had cancellation at 230. Worked her in the schedule.  Patient aware.

## 2019-05-19 NOTE — Telephone Encounter (Signed)
Patient called stating that her knee had her off her feet all weekend.  She is wanting to know if she could be worked in with Dr. Marlou Sa this week.  CB#[phone].  Thank you.

## 2019-05-22 ENCOUNTER — Encounter: Payer: Self-pay | Admitting: Orthopedic Surgery

## 2019-05-22 NOTE — Progress Notes (Signed)
Office Visit Note   Patient: Kelly Soto           Date of Birth: [birth year]           MRN: [id number] Visit Date: 05/19/2019 Requested by: [name], MD [address] [place],  [place] [address] PCP: [name], MD  Subjective: Chief Complaint  Patient presents with  . Right Knee - Pain    HPI: Kelly Soto is a 63 y.o. female who presents to the office complaining of right knee pain.    Patient returns for evaluation of right knee pain.  She has had an MRI scan on April 15, 2019 that showed a 12 mm full-thickness cartilage defect involving the posterior and lateral aspect of the medial femoral condyle as well as a 50% thickness posterior horn medial meniscus tear.  She was doing very well following a cortisone injection.  However her pain is returned without any history of injury.  She denies any symptoms of instability or mechanical symptoms.  She notes medial sided right knee pain.  Her pain is getting worse and she wants to discuss options available to her.  Her pain wakes her up from sleep at night.  She has been taking Tylenol with some relief.  She notes radiation into her right thigh and down into her right calf.              ROS:  All systems reviewed are negative as they relate to the chief complaint within the history of present illness.  Patient denies fevers or chills.  Assessment & Plan: Visit Diagnoses:  1. 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 Defect of articular cartilage     Plan: Patient is a 63 year old female who presents with return of right knee pain.  She has had MRI recently that has shown a cartilage defect and a posterior horn medial meniscus tear.  Discussed options available to the patient including gel injection versus arthroscopy/microfracture versus unicompartmental knee replacement.  After discussion of risks and benefits of all options, patient opted for right knee gel injection.   Patient tolerated the injection well with no complications.  She will return in 10 days for clinical recheck where we will determine whether or not to move forward with surgical options based on her response to the gel injection.  Patient agreed with the plan will follow-up in 10 days.  In general it will be very difficult to predict what type of relief she would have with arthroscopic debridement of the meniscal tear and microfracture of the chondral defect.  I think that is worth trying prior to any type of partial knee replacement which potentially could be the next step.  We will make that decision in 10 days.  Follow-Up Instructions: No follow-ups on file.   Orders:  No orders of the defined types were placed in this encounter.  No orders of the defined types were placed in this encounter.     Procedures: No procedures performed   Clinical Data: No additional findings.  Objective: Vital Signs: There were no vitals taken for this visit.  Physical Exam:  Constitutional: Patient appears well-developed HEENT:  Head: Normocephalic Eyes:EOM are normal Neck: Normal range of motion Cardiovascular: Normal rate Pulmonary/chest: Effort normal Neurologic: Patient is alert Skin: Skin is warm Psychiatric: Patient has normal mood and affect  Ortho Exam:  Right knee Exam Positive effusion Extensor mechanism intact No TTP over the lateral  jointlines, quad tendon, patellar tendon, patella, tibial tubercle, LCL/MCL insertions Tenderness to palpation over the medial joint line and pes anserinus Stable to varus/valgus stresses.  Stable to anterior/posterior drawer Extension to 0 degrees Flexion > 90 degrees  Specialty Comments:  No specialty comments available.  Imaging: No results found.   PMFS History: Patient Active Problem List   Diagnosis Date Noted  . Chronic renal insufficiency 09/24/2013  . Anemia of renal disease 09/24/2013  . H/O gastric bypass 08/22/2013  .  Intestinal malabsorption 08/22/2013  . Anemia, iron deficiency 08/22/2013  . Chronic eustachian tube dysfunction 03/28/2011  . Diabetes mellitus ([place]) 03/28/2011   Past Medical History:  Diagnosis Date  . Anemia of renal disease 09/24/2013  . Chronic renal insufficiency 09/24/2013  . Diabetes mellitus without complication ([place])   . Headache    migrains  . History of hiatal hernia   . Hypertension   . PONV (postoperative nausea and vomiting)   . Ulcer     Family History  Problem Relation Age of Onset  . Atrial fibrillation Mother   . Heart attack Father   . Sudden Cardiac Death Father   . Stroke Maternal Grandmother     Past Surgical History:  Procedure Laterality Date  . ABDOMINAL HYSTERECTOMY  1992  . BREAST BIOPSY Left 03/26/2017   benign  . CARPAL TUNNEL RELEASE Bilateral   . CHOLECYSTECTOMY N/A 05/11/2016   Procedure: LAPAROSCOPIC CHOLECYSTECTOMY;  Surgeon: Erroll Luna, MD;  Location: [facility];  Service: General;  Laterality: N/A;  . GASTRIC BYPASS  2009   Social History   Occupational History  . Not on file  Tobacco Use  . Smoking status: Never Smoker  . Smokeless tobacco: Never Used  . Tobacco comment: never used tobacco  Substance and Sexual Activity  . Alcohol use: No    Alcohol/week: 0.0 standard drinks  . Drug use: No  . Sexual activity: Yes

## 2019-05-25 ENCOUNTER — Encounter: Payer: Self-pay | Admitting: Orthopedic Surgery

## 2019-05-28 ENCOUNTER — Encounter: Payer: Self-pay | Admitting: Orthopedic Surgery

## 2019-05-28 ENCOUNTER — Other Ambulatory Visit: Payer: Self-pay | Admitting: Gastroenterology

## 2019-05-28 ENCOUNTER — Other Ambulatory Visit: Payer: Self-pay

## 2019-05-28 ENCOUNTER — Other Ambulatory Visit: Payer: BC Managed Care – PPO

## 2019-05-28 ENCOUNTER — Ambulatory Visit (INDEPENDENT_AMBULATORY_CARE_PROVIDER_SITE_OTHER): Payer: BC Managed Care – PPO | Admitting: Orthopedic Surgery

## 2019-05-28 DIAGNOSIS — M2410 Other articular cartilage disorders, unspecified site: Secondary | ICD-10-CM

## 2019-05-28 NOTE — Progress Notes (Signed)
Office Visit Note   Patient: Kelly Soto           Date of Birth: 10/27/1955           MRN: [id number] Visit Date: 05/28/2019 Requested by: [name], MD [address] [place],  [place] [address] PCP: [name], MD  Subjective: Chief Complaint  Patient presents with  . Right Knee - Follow-up    HPI: Kelly Soto is a patient with right knee pain.  The gel injection last week.  Did not give her really great relief.  She has chondral defect on the medial femoral condyle along with possible meniscal damage.  She is having continued pain with ambulation.  Not much in the way of mechanical symptoms.  Decision point today was for or against arthroscopic surgery.              ROS: All systems reviewed are negative as they relate to the chief complaint within the history of present illness.  Patient denies  fevers or chills.   Assessment & Plan: Visit Diagnoses: No diagnosis found.  Plan: Impression is right knee chondral damage possible meniscal pathology on the medial side.  Plan is right knee arthroscopy with microfracture.  She will need to be off that knee on a walker for about 3 weeks doing a lot of range of motion exercises get the most benefit out of the microfracture.  Risk and benefits are discussed including but not limited to infection nerve vessel damage incomplete pain relief as well as potential need for more surgery.  Patient understands and wishes to proceed.  All questions answered.  Follow-Up Instructions: No follow-ups on file.   Orders:  No orders of the defined types were placed in this encounter.  No orders of the defined types were placed in this encounter.     Procedures: No procedures performed   Clinical Data: No additional findings.  Objective: Vital Signs: There were no vitals taken for this visit.  Physical Exam:   Constitutional: Patient appears well-developed HEENT:  Head: Normocephalic Eyes:EOM are normal Neck:  Normal range of motion Cardiovascular: Normal rate Pulmonary/chest: Effort normal Neurologic: Patient is alert Skin: Skin is warm Psychiatric: Patient has normal mood and affect    Ortho Exam: Ortho exam demonstrates full active and passive range of motion of that right knee with trace effusion but no focal joint line tenderness.  Extensor mechanism is intact.  No groin pain with internal X rotation of the leg.  No other masses lymphadenopathy or skin changes noted in that right knee region.  Specialty Comments:  No specialty comments available.  Imaging: No results found.   PMFS History: Patient Active Problem List   Diagnosis Date Noted  . Chronic renal insufficiency 09/24/2013  . Anemia of renal disease 09/24/2013  . H/O gastric bypass 08/22/2013  . Intestinal malabsorption 08/22/2013  . Anemia, iron deficiency 08/22/2013  . Chronic eustachian tube dysfunction 03/28/2011  . Diabetes mellitus ([place]) 03/28/2011   Past Medical History:  Diagnosis Date  . Anemia of renal disease 09/24/2013  . Chronic renal insufficiency 09/24/2013  . Diabetes mellitus without complication ([place] [facility])   . Headache    migrains  . History of hiatal hernia   . Hypertension   . PONV (postoperative nausea and vomiting)   . Ulcer     Family History  Problem Relation Age of Onset  . Atrial fibrillation Mother   . Heart attack Father   . Sudden Cardiac Death Father   .  Stroke Maternal Grandmother     Past Surgical History:  Procedure Laterality Date  . ABDOMINAL HYSTERECTOMY  1992  . BREAST BIOPSY Left 03/26/2017   benign  . CARPAL TUNNEL RELEASE Bilateral   . CHOLECYSTECTOMY N/A 05/11/2016   Procedure: LAPAROSCOPIC CHOLECYSTECTOMY;  Surgeon: Erroll Luna, MD;  Location: [facility];  Service: General;  Laterality: N/A;  . GASTRIC BYPASS  2009   Social History   Occupational History  . Not on file  Tobacco Use  . Smoking status: Never Smoker  . Smokeless tobacco: Never Used  . Tobacco comment:  never used tobacco  Substance and Sexual Activity  . Alcohol use: No    Alcohol/week: 0.0 standard drinks  . Drug use: No  . Sexual activity: Yes

## 2019-05-29 ENCOUNTER — Telehealth: Payer: Self-pay | Admitting: Orthopedic Surgery

## 2019-05-29 NOTE — Telephone Encounter (Signed)
Patient called advised she is having an MRI tomorrow and wanted to know if that will interfere with her having surgery on her right knee? The number to contact patient is [phone]

## 2019-05-29 NOTE — Telephone Encounter (Signed)
IC s/w patient and advised should not interfere

## 2019-05-30 ENCOUNTER — Other Ambulatory Visit: Payer: Self-pay

## 2019-05-30 ENCOUNTER — Ambulatory Visit
Admission: RE | Admit: 2019-05-30 | Discharge: 2019-05-30 | Disposition: A | Discharge status: Home / Self Care | Payer: BC Managed Care – PPO | Source: Ambulatory Visit | Attending: Gastroenterology | Admitting: Gastroenterology

## 2019-05-30 DIAGNOSIS — K769 Liver disease, unspecified: Secondary | ICD-10-CM

## 2019-05-30 MED ORDER — GADOBENATE DIMEGLUMINE 529 MG/ML IV SOLN
6.0000 mL | Freq: Once | INTRAVENOUS | Status: AC | PRN
  Administered 2019-05-30: 6 mL via INTRAVENOUS

## 2019-06-04 ENCOUNTER — Other Ambulatory Visit: Payer: Self-pay | Admitting: Surgical

## 2019-06-04 MED ORDER — RIVAROXABAN 10 MG PO TABS: 10.0000 mg | ORAL_TABLET | Freq: Every day | ORAL | 0 refills | Status: DC

## 2019-06-04 MED ORDER — METHOCARBAMOL 500 MG PO TABS: 500.0000 mg | ORAL_TABLET | Freq: Three times a day (TID) | ORAL | 0 refills | Status: DC | PRN

## 2019-06-04 MED ORDER — OXYCODONE HCL 5 MG PO TABS: 5.0000 mg | ORAL_TABLET | ORAL | 0 refills | Status: DC | PRN

## 2019-06-05 DIAGNOSIS — M94261 Chondromalacia, right knee: Secondary | ICD-10-CM | POA: Diagnosis not present

## 2019-06-09 ENCOUNTER — Telehealth: Payer: Self-pay | Admitting: Orthopedic Surgery

## 2019-06-09 NOTE — Telephone Encounter (Signed)
Please advise. Thanks.  

## 2019-06-09 NOTE — Telephone Encounter (Signed)
Pt called in said she had surgery with Dr.Dean on 06/05/2019 and she is wanting to clarify if she should start bending her knee and doing exercises? Also wants to know if she can take the ace bandage off?  Please give her a call  [phone]

## 2019-06-10 NOTE — Telephone Encounter (Signed)
IC s/w patient and advised. She verbalized understanding.  

## 2019-06-13 ENCOUNTER — Other Ambulatory Visit: Payer: Self-pay | Admitting: Family Medicine

## 2019-06-13 ENCOUNTER — Other Ambulatory Visit: Payer: Self-pay

## 2019-06-13 ENCOUNTER — Ambulatory Visit (INDEPENDENT_AMBULATORY_CARE_PROVIDER_SITE_OTHER): Payer: BC Managed Care – PPO | Admitting: Orthopedic Surgery

## 2019-06-13 ENCOUNTER — Encounter: Payer: Self-pay | Admitting: Orthopedic Surgery

## 2019-06-13 DIAGNOSIS — I712 Thoracic aortic aneurysm, without rupture, unspecified: Secondary | ICD-10-CM

## 2019-06-13 DIAGNOSIS — Z9889 Other specified postprocedural states: Secondary | ICD-10-CM

## 2019-06-13 NOTE — Progress Notes (Signed)
   Post-Op Visit Note   Patient: Kelly Soto           Date of Birth: 09-17-1955           MRN: [id number] Visit Date: 06/13/2019 PCP: [name], MD   Assessment & Plan:  Chief Complaint:  Chief Complaint  Patient presents with  . Right Knee - Routine Post Op   Visit Diagnoses:  1. Status post arthroscopy of right knee     Plan: Patient is a 63 year old female who presents s/p right knee arthroscopy with microfracture on 06/05/2019.  Patient notes that she is feeling well.  She denies any significant pain and has not taking any pain medication in 24 hours.  She has been wearing her knee immobilizer at all times.  Sutures were removed in the office today and Steri-Strips applied.  Her incisions look to be healing well at this point.  She has 0 degrees of extension.  She does have an effusion.  Patient may discontinue using the knee immobilizer at all times.  Recommended that she work on straight leg raises and knee range of motion exercises.  She may be toe-touch weightbearing and will follow up in 4 weeks.  Discussed her arthroscopy findings.  Her cartilage defects were much more impressive on arthroscopy than they were on MRI.  Follow-Up Instructions: No follow-ups on file.   Orders:  No orders of the defined types were placed in this encounter.  No orders of the defined types were placed in this encounter.   Imaging: No results found.  PMFS History: Patient Active Problem List   Diagnosis Date Noted  . Chronic renal insufficiency 09/24/2013  . Anemia of renal disease 09/24/2013  . H/O gastric bypass 08/22/2013  . Intestinal malabsorption 08/22/2013  . Anemia, iron deficiency 08/22/2013  . Chronic eustachian tube dysfunction 03/28/2011  . Diabetes mellitus ([place]) 03/28/2011   Past Medical History:  Diagnosis Date  . Anemia of renal disease 09/24/2013  . Chronic renal insufficiency 09/24/2013  . Diabetes mellitus without complication ([place])   . Headache    migrains  . History of hiatal hernia   . Hypertension   . PONV (postoperative nausea and vomiting)   . Ulcer     Family History  Problem Relation Age of Onset  . Atrial fibrillation Mother   . Heart attack Father   . Sudden Cardiac Death Father   . Stroke Maternal Grandmother     Past Surgical History:  Procedure Laterality Date  . ABDOMINAL HYSTERECTOMY  1992  . BREAST BIOPSY Left 03/26/2017   benign  . CARPAL TUNNEL RELEASE Bilateral   . CHOLECYSTECTOMY N/A 05/11/2016   Procedure: LAPAROSCOPIC CHOLECYSTECTOMY;  Surgeon: Erroll Luna, MD;  Location: [facility];  Service: General;  Laterality: N/A;  . GASTRIC BYPASS  2009   Social History   Occupational History  . Not on file  Tobacco Use  . Smoking status: Never Smoker  . Smokeless tobacco: Never Used  . Tobacco comment: never used tobacco  Substance and Sexual Activity  . Alcohol use: No    Alcohol/week: 0.0 standard drinks  . Drug use: No  . Sexual activity: Yes

## 2019-06-16 ENCOUNTER — Other Ambulatory Visit: Payer: Self-pay | Admitting: Surgical

## 2019-06-17 ENCOUNTER — Other Ambulatory Visit: Payer: Self-pay | Admitting: Family Medicine

## 2019-06-18 ENCOUNTER — Other Ambulatory Visit: Payer: Self-pay | Admitting: Family Medicine

## 2019-06-18 DIAGNOSIS — M810 Age-related osteoporosis without current pathological fracture: Secondary | ICD-10-CM

## 2019-06-23 ENCOUNTER — Telehealth: Payer: Self-pay | Admitting: Orthopedic Surgery

## 2019-06-23 NOTE — Telephone Encounter (Signed)
Kelly Soto is s/p knee arthroscopy and saw Dr. Marlou Sa last week for a post op visit.  She states that he advised her that she could start putting light weight on her surgical leg.  Is it ok for her to use a can or does she still need to use her walker?

## 2019-06-23 NOTE — Telephone Encounter (Signed)
I called patient. I advised she should stay with walker as last office note recommended toe touch weightbearing only.  She has been putting a little more weight on the foot because she thought it was only light weight bearing. She will continue with walker and toe touch weightbearing until follow up appointment in the office.

## 2019-06-24 ENCOUNTER — Other Ambulatory Visit: Payer: Self-pay

## 2019-06-24 ENCOUNTER — Ambulatory Visit
Admission: RE | Admit: 2019-06-24 | Discharge: 2019-06-24 | Disposition: A | Discharge status: Home / Self Care | Payer: BC Managed Care – PPO | Source: Ambulatory Visit | Attending: Family Medicine | Admitting: Family Medicine

## 2019-06-24 DIAGNOSIS — I712 Thoracic aortic aneurysm, without rupture, unspecified: Secondary | ICD-10-CM

## 2019-06-24 MED ORDER — IOPAMIDOL (ISOVUE-370) INJECTION 76%
75.0000 mL | Freq: Once | INTRAVENOUS | Status: AC | PRN
  Administered 2019-06-24: 75 mL via INTRAVENOUS

## 2019-07-03 ENCOUNTER — Other Ambulatory Visit: Payer: BC Managed Care – PPO

## 2019-07-03 ENCOUNTER — Ambulatory Visit: Payer: BC Managed Care – PPO | Admitting: Hematology & Oncology

## 2019-07-03 ENCOUNTER — Ambulatory Visit: Payer: BC Managed Care – PPO

## 2019-07-07 ENCOUNTER — Telehealth: Payer: Self-pay | Admitting: Orthopedic Surgery

## 2019-07-07 NOTE — Telephone Encounter (Signed)
IC advised.  

## 2019-07-07 NOTE — Telephone Encounter (Signed)
Please advise. Thanks.  

## 2019-07-07 NOTE — Telephone Encounter (Signed)
Her call pls call

## 2019-07-07 NOTE — Telephone Encounter (Signed)
Pt called in wanting to ask dr.dean if it was okay for her to drive, she had her surgery on 06-05-2019.  [phone]

## 2019-07-11 ENCOUNTER — Other Ambulatory Visit: Payer: Self-pay

## 2019-07-11 ENCOUNTER — Encounter: Payer: Self-pay | Admitting: Orthopedic Surgery

## 2019-07-11 ENCOUNTER — Ambulatory Visit (INDEPENDENT_AMBULATORY_CARE_PROVIDER_SITE_OTHER): Payer: BC Managed Care – PPO | Admitting: Orthopedic Surgery

## 2019-07-11 DIAGNOSIS — Z9889 Other specified postprocedural states: Secondary | ICD-10-CM

## 2019-07-11 NOTE — Progress Notes (Signed)
   Post-Op Visit Note   Patient: Kelly Soto           Date of Birth: 1956/02/17           MRN: [id number] Visit Date: 07/11/2019 PCP: [name], MD   Assessment & Plan:  Chief Complaint:  Chief Complaint  Patient presents with  . Right Knee - Follow-up   Visit Diagnoses:  1. Status post arthroscopy of right knee     Plan: Patient is a 64 year old female who presents to the office s/p right knee arthroscopy with microfracture. Patient is about 4 to 5 weeks out from procedure. She is ambulating full weightbearing with a cane. She notes some continued discomfort that has not significantly improved with surgery but overall she is able to get around better. She does not wake with pain as much as she had prior to procedure. She is taking Tylenol as needed without any narcotic or anti-inflammatory medication (she cannot take NSAIDs). She notes occasional instability but has continued with a stationary bike for strengthening the leg.   On exam her incisions are healing well and she has great range of motion. She has a trace effusion. She does have some continued tenderness over the joint line. She feels that she will eventually want to have a knee replacement at some point but she wants to wait it out and see much improvement she can obtain from this procedure. She has a history of trying gel and cortisone injection, but only the cortisone injection improved her pain. Plan to continue with stationary bike and leg extension exercises with follow-up in 6 weeks for clinical recheck.  Follow-Up Instructions: No follow-ups on file.   Orders:  No orders of the defined types were placed in this encounter.  No orders of the defined types were placed in this encounter.   Imaging: No results found.  PMFS History: Patient Active Problem List   Diagnosis Date Noted  . Chronic renal insufficiency 09/24/2013  . Anemia of renal disease 09/24/2013  . H/O gastric bypass 08/22/2013  .  Intestinal malabsorption 08/22/2013  . Anemia, iron deficiency 08/22/2013  . Chronic eustachian tube dysfunction 03/28/2011  . Diabetes mellitus ([place]) 03/28/2011   Past Medical History:  Diagnosis Date  . Anemia of renal disease 09/24/2013  . Chronic renal insufficiency 09/24/2013  . Diabetes mellitus without complication ([place])   . Headache    migrains  . History of hiatal hernia   . Hypertension   . PONV (postoperative nausea and vomiting)   . Ulcer     Family History  Problem Relation Age of Onset  . Atrial fibrillation Mother   . Heart attack Father   . Sudden Cardiac Death Father   . Stroke Maternal Grandmother     Past Surgical History:  Procedure Laterality Date  . ABDOMINAL HYSTERECTOMY  1992  . BREAST BIOPSY Left 03/26/2017   benign  . CARPAL TUNNEL RELEASE Bilateral   . CHOLECYSTECTOMY N/A 05/11/2016   Procedure: LAPAROSCOPIC CHOLECYSTECTOMY;  Surgeon: Erroll Luna, MD;  Location: [facility];  Service: General;  Laterality: N/A;  . GASTRIC BYPASS  2009   Social History   Occupational History  . Not on file  Tobacco Use  . Smoking status: Never Smoker  . Smokeless tobacco: Never Used  . Tobacco comment: never used tobacco  Substance and Sexual Activity  . Alcohol use: No    Alcohol/week: 0.0 standard drinks  . Drug use: No  . Sexual activity: Yes

## 2019-07-22 ENCOUNTER — Other Ambulatory Visit: Payer: Self-pay | Admitting: Family

## 2019-07-29 ENCOUNTER — Inpatient Hospital Stay: Payer: Medicare Other | Attending: Hematology & Oncology

## 2019-07-29 ENCOUNTER — Inpatient Hospital Stay: Payer: Medicare Other

## 2019-07-29 ENCOUNTER — Telehealth: Payer: Self-pay | Admitting: Family

## 2019-07-29 ENCOUNTER — Other Ambulatory Visit: Payer: Self-pay

## 2019-07-29 ENCOUNTER — Inpatient Hospital Stay (HOSPITAL_BASED_OUTPATIENT_CLINIC_OR_DEPARTMENT_OTHER): Payer: Medicare Other | Admitting: Family

## 2019-07-29 ENCOUNTER — Encounter: Payer: Self-pay | Admitting: Family

## 2019-07-29 VITALS — BP 169/69 | HR 74 | Temp 97.1°F | Resp 17 | Wt 142.0 lb

## 2019-07-29 DIAGNOSIS — N189 Chronic kidney disease, unspecified: Secondary | ICD-10-CM | POA: Diagnosis present

## 2019-07-29 DIAGNOSIS — N182 Chronic kidney disease, stage 2 (mild): Secondary | ICD-10-CM

## 2019-07-29 DIAGNOSIS — D500 Iron deficiency anemia secondary to blood loss (chronic): Secondary | ICD-10-CM

## 2019-07-29 DIAGNOSIS — D631 Anemia in chronic kidney disease: Secondary | ICD-10-CM | POA: Diagnosis present

## 2019-07-29 DIAGNOSIS — K9041 Non-celiac gluten sensitivity: Secondary | ICD-10-CM

## 2019-07-29 LAB — CBC WITH DIFFERENTIAL (CANCER CENTER ONLY)
Abs Immature Granulocytes: 0.08 10*3/uL — ABNORMAL HIGH (ref 0.00–0.07)
Basophils Absolute: 0 10*3/uL (ref 0.0–0.1)
Basophils Relative: 0 %
Eosinophils Absolute: 0.1 10*3/uL (ref 0.0–0.5)
Eosinophils Relative: 1 %
HCT: 34 % — ABNORMAL LOW (ref 36.0–46.0)
Hemoglobin: 10.4 g/dL — ABNORMAL LOW (ref 12.0–15.0)
Immature Granulocytes: 1 %
Lymphocytes Relative: 19 %
Lymphs Abs: 1.6 10*3/uL (ref 0.7–4.0)
MCH: 29.8 pg (ref 26.0–34.0)
MCHC: 30.6 g/dL (ref 30.0–36.0)
MCV: 97.4 fL (ref 80.0–100.0)
Monocytes Absolute: 0.7 10*3/uL (ref 0.1–1.0)
Monocytes Relative: 8 %
Neutro Abs: 6.2 10*3/uL (ref 1.7–7.7)
Neutrophils Relative %: 71 %
Platelet Count: 236 10*3/uL (ref 150–400)
RBC: 3.49 MIL/uL — ABNORMAL LOW (ref 3.87–5.11)
RDW: 13.4 % (ref 11.5–15.5)
WBC Count: 8.8 10*3/uL (ref 4.0–10.5)
nRBC: 0 % (ref 0.0–0.2)

## 2019-07-29 LAB — IRON AND TIBC
Iron: 73 ug/dL (ref 41–142)
Saturation Ratios: 24 % (ref 21–57)
TIBC: 309 ug/dL (ref 236–444)
UIBC: 236 ug/dL (ref 120–384)

## 2019-07-29 LAB — CMP (CANCER CENTER ONLY)
ALT: 18 U/L (ref 0–44)
AST: 16 U/L (ref 15–41)
Albumin: 4.3 g/dL (ref 3.5–5.0)
Alkaline Phosphatase: 46 U/L (ref 38–126)
Anion gap: 9 (ref 5–15)
BUN: 37 mg/dL — ABNORMAL HIGH (ref 8–23)
CO2: 26 mmol/L (ref 22–32)
Calcium: 10.1 mg/dL (ref 8.9–10.3)
Chloride: 106 mmol/L (ref 98–111)
Creatinine: 1.36 mg/dL — ABNORMAL HIGH (ref 0.44–1.00)
GFR, Est AFR Am: 48 mL/min — ABNORMAL LOW (ref >60)
GFR, Estimated: 41 mL/min — ABNORMAL LOW (ref >60)
Glucose, Bld: 106 mg/dL — ABNORMAL HIGH (ref 70–99)
Potassium: 5.6 mmol/L — ABNORMAL HIGH (ref 3.5–5.1)
Sodium: 141 mmol/L (ref 135–145)
Total Bilirubin: 0.2 mg/dL — ABNORMAL LOW (ref 0.3–1.2)
Total Protein: 7.1 g/dL (ref 6.5–8.1)

## 2019-07-29 LAB — RETICULOCYTES
Immature Retic Fract: 8.4 % (ref 2.3–15.9)
RBC.: 3.49 MIL/uL — ABNORMAL LOW (ref 3.87–5.11)
Retic Count, Absolute: 35.9 10*3/uL (ref 19.0–186.0)
Retic Ct Pct: 1 % (ref 0.4–3.1)

## 2019-07-29 LAB — FERRITIN: Ferritin: 573 ng/mL — ABNORMAL HIGH (ref 11–307)

## 2019-07-29 MED ORDER — EPOETIN ALFA-EPBX 40000 UNIT/ML IJ SOLN
INTRAMUSCULAR | Status: AC
  Filled 2019-07-29: qty 1

## 2019-07-29 MED ORDER — EPOETIN ALFA-EPBX 40000 UNIT/ML IJ SOLN
40000.0000 [IU] | Freq: Once | INTRAMUSCULAR | Status: AC
  Administered 2019-07-29: 40000 [IU] via SUBCUTANEOUS

## 2019-07-29 NOTE — Patient Instructions (Signed)

## 2019-07-29 NOTE — Progress Notes (Signed)
Hematology and Oncology Follow Up Visit  Kelly Soto [id number] 09/18/1955 64 y.o. 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 She is doing well but has some occasional fatigue in the afternoon.  She does not sleep very much despite taking her Desyrel as prescribed at bedtime. She will let her PCP know this is no longer effective.  No fever, chills, n/v, cough, rash, dizziness, SOB, chest pain, palpitations, abdominal pain or changes in bowel or bladder habits.  She has not noted any blood loss. No bruising or petechiae.  No swelling, tenderness, numbness or tingling in her extremities.  No falls or syncope.  She has maintained a good appetite and is staying well hydrated. Her weight is stable.  She stays busy with her family and spending time with her sweet puppy Kelly Soto.   ECOG Performance Status: 1 - Symptomatic but completely ambulatory  Medications:  Allergies as of 07/29/2019      Reactions   Ace Inhibitors Other (See Comments), Anaphylaxis   Angioedema   Aspirin Other (See Comments)   Cannot take due to gastric bypass surgery   Nsaids Anaphylaxis, Other (See Comments)   S/p gastric bypass Cannot take orally due to gastric bypass S/p gastric bypass   Sular [nisoldipine Er] Other (See Comments)   angioedema   Boniva [ibandronic Acid] Other (See Comments)   Joint pain      Medication List       Accurate as of July 29, 2019  9:46 AM. If you have any questions, ask your nurse or doctor.        acetaminophen 500 MG chewable tablet Commonly known as: TYLENOL Chew 500-1,000 mg by mouth every 6 (six) hours as needed for pain (depends on pain level).   baclofen  10 MG tablet Commonly known as: LIORESAL Take 10 mg by mouth daily as needed. migraines   BD Pen Needle Nano U/F 32G X 4 MM Misc Generic drug: Insulin Pen Needle USE 1 ONCE DAILY WITH TRESIBA   Contour Next EZ w/Device Kit USE TO CHECK BLOOD SUGARS DAILY   Contour Next Test test strip Generic drug: glucose blood USE STRIP TO CHECK GLUCOSE ONCE DAILY   denosumab 60 MG/ML Soln injection Commonly known as: PROLIA Inject 60 mg into the skin every 6 (six) months. Administer in upper arm, thigh, or abdomen  Next injection is due 05-2016   diazepam 5 MG tablet Commonly known as: VALIUM Take 5 mg by mouth daily as needed for anxiety.   Emgality 120 MG/ML Sosy Generic drug: Galcanezumab-gnlm Inject 120 mg/mL into the skin every 30 (thirty) days.   fluticasone 50 MCG/ACT nasal spray Commonly known as: FLONASE Place 2 sprays into both nostrils at bedtime.   glyBURIDE-metformin 2.5-500 MG tablet Commonly known as: GLUCOVANCE Take 2 tablets by mouth 2 (two) times daily.   Glyxambi 25-5 MG Tabs Generic drug: Empagliflozin-linaGLIPtin Take 1 tablet by mouth every morning.   hydroxypropyl methylcellulose / hypromellose 2.5 % ophthalmic solution Commonly known as: ISOPTO TEARS / GONIOVISC Place 1 drop into both eyes 3 (three) times daily.   methocarbamol 500 MG tablet Commonly known as: Robaxin Take 1 tablet (500 mg total) by mouth every 8 (eight) hours as needed  for muscle spasms.   metoprolol succinate 25 MG 24 hr tablet Commonly known as: TOPROL-XL Take 25 mg by mouth every morning.   Microlet Lancets Misc USE AS DIRECTED TO CHECK BLOOD SUGAR   Multi-Vitamins Tabs Take by mouth.   omeprazole 40 MG capsule Commonly known as: PRILOSEC Take 40 mg by mouth 2 (two) times daily.   oxyCODONE 5 MG immediate release tablet Commonly known as: Roxicodone Take 1 tablet (5 mg total) by mouth every 4 (four) hours as needed.   pioglitazone 15 MG tablet Commonly known as:  ACTOS Take 15 mg by mouth daily.   tobramycin 0.3 % ophthalmic solution Commonly known as: TOBREX 1 drop 4 (four) times daily.   traZODone 50 MG tablet Commonly known as: DESYREL   Tyler Aas FlexTouch 100 UNIT/ML Sopn FlexTouch Pen Generic drug: insulin degludec Inject 10 Units into the skin daily.   Xarelto 10 MG Tabs tablet Generic drug: rivaroxaban TAKE 1 TABLET BY MOUTH EVERY DAY       Allergies:  Allergies  Allergen Reactions  . Ace Inhibitors Other (See Comments) and Anaphylaxis    Angioedema  . Aspirin Other (See Comments)    Cannot take due to gastric bypass surgery  . Nsaids Anaphylaxis and Other (See Comments)    S/p gastric bypass Cannot take orally due to gastric bypass S/p gastric bypass  . Sular [Nisoldipine Er] Other (See Comments)    angioedema  . Boniva [Ibandronic Acid] Other (See Comments)    Joint pain    Past Medical History, Surgical history, Social history, and Family History were reviewed and updated.  Review of Systems: All other 10 point review of systems is negative.   Physical Exam:  weight is 142 lb (64.4 kg). Her temporal temperature is 97.1 F (36.2 C) (abnormal). Her blood pressure is 169/69 (abnormal) and her pulse is 74. Her respiration is 17.   Wt Readings from Last 3 Encounters:  07/29/19 142 lb (64.4 kg)  05/09/19 138 lb (62.6 kg)  04/09/19 132 lb (59.9 kg)    Ocular: Sclerae unicteric, pupils equal, round and reactive to light Ear-nose-throat: Oropharynx clear, dentition fair Lymphatic: No cervical or supraclavicular adenopathy Lungs no rales or rhonchi, good excursion bilaterally Heart regular rate and rhythm, no murmur appreciated Abd soft, nontender, positive bowel sounds, no liver or spleen tip palpated on exam, no fluid wave  MSK no focal spinal tenderness, no joint edema Neuro: non-focal, well-oriented, appropriate affect Breasts: Deferred   Lab Results  Component Value Date   WBC 8.8 07/29/2019   HGB 10.4 (L)  07/29/2019   HCT 34.0 (L) 07/29/2019   MCV 97.4 07/29/2019   PLT 236 07/29/2019   Lab Results  Component Value Date   FERRITIN 740 (H) 05/09/2019   IRON 68 05/09/2019   TIBC 299 05/09/2019   UIBC 230 05/09/2019   IRONPCTSAT 23 05/09/2019   Lab Results  Component Value Date   RETICCTPCT 1.0 07/29/2019   RBC 3.49 (L) 07/29/2019   RBC 3.49 (L) 07/29/2019   RETICCTABS 21.6 01/12/2015   No results found for: KPAFRELGTCHN, LAMBDASER, KAPLAMBRATIO No results found for: Kandis Cocking, IGMSERUM Lab Results  Component Value Date   TOTALPROTELP 7.3 07/23/2013   ALBUMINELP 61.9 07/23/2013   A1GS 3.7 07/23/2013   A2GS 10.8 07/23/2013   BETS 7.0 07/23/2013   BETA2SER 3.8 07/23/2013   GAMS 12.8 07/23/2013   MSPIKE NOT DET 07/23/2013   SPEI * 07/23/2013     Chemistry  Component Value Date/Time   NA 141 07/29/2019 0845   NA 145 05/10/2017 0748   NA 137 12/06/2016 0746   K 5.6 (H) 07/29/2019 0845   K 4.7 05/10/2017 0748   K 5.2 (H) 12/06/2016 0746   CL 106 07/29/2019 0845   CL 110 (H) 05/10/2017 0748   CO2 26 07/29/2019 0845   CO2 23 05/10/2017 0748   CO2 23 12/06/2016 0746   BUN 37 (H) 07/29/2019 0845   BUN 28 (H) 05/10/2017 0748   BUN 26.1 (H) 12/06/2016 0746   CREATININE 1.36 (H) 07/29/2019 0845   CREATININE 1.2 05/10/2017 0748   CREATININE 1.2 (H) 12/06/2016 0746      Component Value Date/Time   CALCIUM 10.1 07/29/2019 0845   CALCIUM 9.1 05/10/2017 0748   CALCIUM 9.1 12/06/2016 0746   ALKPHOS 46 07/29/2019 0845   ALKPHOS 61 05/10/2017 0748   ALKPHOS 41 12/06/2016 0746   AST 16 07/29/2019 0845   AST 23 12/06/2016 0746   ALT 18 07/29/2019 0845   ALT 31 05/10/2017 0748   ALT 26 12/06/2016 0746   BILITOT 0.2 (L) 07/29/2019 0845   BILITOT 0.26 12/06/2016 0746       Impression and Plan: Kelly Soto is a very pleasant 64 yo caucasian female with multifactorial anemia.  She did receive Retacrit today for Hgb 10.4.  We will see what her iron studies look like  and replace next week if needed.  We will plan to see her back in another 2 months.  She will contact our office with any questions or concerns. We can certainly see her sooner if needed.   Laverna Peace, NP 2/2/20219:46 AM

## 2019-07-29 NOTE — Telephone Encounter (Signed)
Paatient advised of appointments and calendar was mailed as well. Per 2/2 los

## 2019-08-22 ENCOUNTER — Encounter: Payer: Self-pay | Admitting: Orthopedic Surgery

## 2019-08-22 ENCOUNTER — Other Ambulatory Visit: Payer: Self-pay

## 2019-08-22 ENCOUNTER — Ambulatory Visit (INDEPENDENT_AMBULATORY_CARE_PROVIDER_SITE_OTHER): Payer: Medicare Other | Admitting: Orthopedic Surgery

## 2019-08-22 ENCOUNTER — Telehealth: Payer: Self-pay | Admitting: Orthopedic Surgery

## 2019-08-22 DIAGNOSIS — Z9889 Other specified postprocedural states: Secondary | ICD-10-CM

## 2019-08-22 MED ORDER — CYCLOBENZAPRINE HCL 10 MG PO TABS: ORAL_TABLET | ORAL | 0 refills | Status: DC

## 2019-08-22 MED ORDER — TRAMADOL HCL 50 MG PO TABS: ORAL_TABLET | ORAL | 0 refills | Status: DC

## 2019-08-22 NOTE — Progress Notes (Signed)
Post-Op Visit Note   Patient: Kelly Soto           Date of Birth: 1956-03-31           MRN: [id number] Visit Date: 08/22/2019 PCP: [name], MD   Assessment & Plan:  Chief Complaint:  Chief Complaint  Patient presents with  . Follow-up   Visit Diagnoses: No diagnosis found.  Plan: Patient is a 64 year old female who presents about 2.5 months out from right knee microfracture.  Patient notes continued right knee pain that she localizes to the medial aspect of the right knee.  She notes that it is 20% better since surgery but she is not satisfied with her overall condition of the right knee.  She has good and bad days and on her bad days she wakes with pain due to the right knee.  She is taking Tylenol for pain control as she cannot take NSAIDs.  The knee occasionally gives out on her but she denies any mechanical symptoms.  Her improvement has stagnated over the last 6 weeks with no significant improvement since last office visit.  She has had a cortisone injection in the past prior to surgery that provided her good relief but she tried a gel injection prior to surgery that had no effect.  She wants to put off knee replacement until she cannot tolerate her pain.  Prescribed tramadol and Flexeril.  Plan to see her back as needed in the future for cortisone injection.  Patient agreed with this plan and will follow up with the office when her pain becomes unbearable.  Follow-Up Instructions: No follow-ups on file.   Orders:  No orders of the defined types were placed in this encounter.  Meds ordered this encounter  Medications  . cyclobenzaprine (FLEXERIL) 10 MG tablet    Sig: 1 po q hs prn    Dispense:  15 tablet    Refill:  0  . traMADol (ULTRAM) 50 MG tablet    Sig: 1 po q hs prn    Dispense:  30 tablet    Refill:  0    Imaging: No results found.  PMFS History: Patient Active Problem List   Diagnosis Date Noted  . Chronic renal insufficiency 09/24/2013  .  Anemia of renal disease 09/24/2013  . H/O gastric bypass 08/22/2013  . Intestinal malabsorption 08/22/2013  . Anemia, iron deficiency 08/22/2013  . Chronic eustachian tube dysfunction 03/28/2011  . Diabetes mellitus ([place]) 03/28/2011   Past Medical History:  Diagnosis Date  . Anemia of renal disease 09/24/2013  . Chronic renal insufficiency 09/24/2013  . Diabetes mellitus without complication ([place])   . Headache    migrains  . History of hiatal hernia   . Hypertension   . PONV (postoperative nausea and vomiting)   . Ulcer     Family History  Problem Relation Age of Onset  . Atrial fibrillation Mother   . Heart attack Father   . Sudden Cardiac Death Father   . Stroke Maternal Grandmother     Past Surgical History:  Procedure Laterality Date  . ABDOMINAL HYSTERECTOMY  1992  . BREAST BIOPSY Left 03/26/2017   benign  . CARPAL TUNNEL RELEASE Bilateral   . CHOLECYSTECTOMY N/A 05/11/2016   Procedure: LAPAROSCOPIC CHOLECYSTECTOMY;  Surgeon: Erroll Luna, MD;  Location: [facility];  Service: General;  Laterality: N/A;  . GASTRIC BYPASS  2009   Social History   Occupational History  . Not on file  Tobacco Use  .  Smoking status: Never Smoker  . Smokeless tobacco: Never Used  . Tobacco comment: never used tobacco  Substance and Sexual Activity  . Alcohol use: No    Alcohol/week: 0.0 standard drinks  . Drug use: No  . Sexual activity: Yes

## 2019-08-22 NOTE — Telephone Encounter (Signed)
Patient called and stated that she was given Flexeril and she is allergic to NSAID.  Patient requested something else to take instead.  Please call patient [phone]

## 2019-08-22 NOTE — Telephone Encounter (Signed)
Please advise. Thanks.  

## 2019-08-22 NOTE — Telephone Encounter (Signed)
IC s/w patient and advised per Lurena Joiner

## 2019-08-26 ENCOUNTER — Ambulatory Visit
Admission: RE | Admit: 2019-08-26 | Discharge: 2019-08-26 | Disposition: A | Discharge status: Home / Self Care | Payer: Medicare Other | Source: Ambulatory Visit | Attending: Family Medicine | Admitting: Family Medicine

## 2019-08-26 ENCOUNTER — Other Ambulatory Visit: Payer: Self-pay

## 2019-08-26 DIAGNOSIS — M810 Age-related osteoporosis without current pathological fracture: Secondary | ICD-10-CM

## 2019-09-24 ENCOUNTER — Encounter: Payer: Self-pay | Admitting: Orthopedic Surgery

## 2019-09-24 ENCOUNTER — Ambulatory Visit (INDEPENDENT_AMBULATORY_CARE_PROVIDER_SITE_OTHER): Payer: Medicare Other | Admitting: Orthopedic Surgery

## 2019-09-24 ENCOUNTER — Other Ambulatory Visit: Payer: Self-pay

## 2019-09-24 DIAGNOSIS — M25461 Effusion, right knee: Secondary | ICD-10-CM

## 2019-09-24 MED ORDER — BUPIVACAINE HCL 0.25 % IJ SOLN
4.0000 mL | INTRAMUSCULAR | Status: AC | PRN
  Administered 2019-09-24: 4 mL via INTRA_ARTICULAR

## 2019-09-24 MED ORDER — LIDOCAINE HCL 1 % IJ SOLN
5.0000 mL | INTRAMUSCULAR | Status: AC | PRN
  Administered 2019-09-24: 5 mL

## 2019-09-24 MED ORDER — METHYLPREDNISOLONE ACETATE 40 MG/ML IJ SUSP
40.0000 mg | INTRAMUSCULAR | Status: AC | PRN
  Administered 2019-09-24: 40 mg via INTRA_ARTICULAR

## 2019-09-24 NOTE — Progress Notes (Addendum)
Office Visit Note   Patient: Kelly Soto           Date of Birth: 06-06-1956           MRN: [id number] Visit Date: 09/24/2019 Requested by: [name], MD [address] [place],  [place] [address] PCP: [name], MD  Subjective: Chief Complaint  Patient presents with  . Right Knee - Pain    HPI: Kelly Soto is a patient who is now about 4 months out from right knee arthroscopy.  She had fairly significant medial compartment arthritis as well as meniscal root tear.  She states that her pain is about level 6 out of 10 on most days.  Has some occasional giving out.  Takes some occasional over-the-counter medication but does not do too much of that because of history of gastric bypass.              ROS: All systems reviewed are negative as they relate to the chief complaint within the history of present illness.  Patient denies  fevers or chills.   Assessment & Plan: Visit Diagnoses:  1. Effusion, right knee     Plan: Impression is right knee pain following arthroscopy with likely some progression of arthritis but fortunately fairly minimal effusion today.  Plan is cortisone injection.  Continue with nonweightbearing quad strengthening exercises.  She is going to need knee replacement at sometime in the future but will see how she does until that time.  Follow-up as needed  Follow-Up Instructions: Return if symptoms worsen or fail to improve.   Orders:  No orders of the defined types were placed in this encounter.  No orders of the defined types were placed in this encounter.     Procedures: Large Joint Inj: R knee on 09/24/2019 9:13 AM Indications: diagnostic evaluation, joint swelling and pain Details: 18 G 1.5 in needle, superolateral approach  Arthrogram: No  Medications: 5 mL lidocaine 1 %; 40 mg methylPREDNISolone acetate 40 MG/ML; 4 mL bupivacaine 0.25 % Outcome: tolerated well, no immediate complications Procedure, treatment alternatives,  risks and benefits explained, specific risks discussed. Consent was given by the patient. Immediately prior to procedure a time out was called to verify the correct patient, procedure, equipment, support staff and site/side marked as required. Patient was prepped and draped in the usual sterile fashion.       Clinical Data: No additional findings.  Objective: Vital Signs: There were no vitals taken for this visit.  Physical Exam:   Constitutional: Patient appears well-developed HEENT:  Head: Normocephalic Eyes:EOM are normal Neck: Normal range of motion Cardiovascular: Normal rate Pulmonary/chest: Effort normal Neurologic: Patient is alert Skin: Skin is warm Psychiatric: Patient has normal mood and affect    Ortho Exam: Ortho exam demonstrates full range of motion of that left knee with no to trace effusion.  Extensor mechanism is intact.  Collateral crucial ligaments are stable.  Mild medial joint line tenderness is present.  No masses lymphadenopathy or skin changes noted in that right knee region.  Specialty Comments:  No specialty comments available.  Imaging: No results found.   PMFS History: Patient Active Problem List   Diagnosis Date Noted  . Chronic renal insufficiency 09/24/2013  . Anemia of renal disease 09/24/2013  . H/O gastric bypass 08/22/2013  . Intestinal malabsorption 08/22/2013  . Anemia, iron deficiency 08/22/2013  . Chronic eustachian tube dysfunction 03/28/2011  . Diabetes mellitus ([place]) 03/28/2011   Past Medical History:  Diagnosis Date  .  Anemia of renal disease 09/24/2013  . Chronic renal insufficiency 09/24/2013  . Diabetes mellitus without complication ([place])   . Headache    migrains  . History of hiatal hernia   . Hypertension   . PONV (postoperative nausea and vomiting)   . Ulcer     Family History  Problem Relation Age of Onset  . Atrial fibrillation Mother   . Heart attack Father   . Sudden Cardiac Death Father   . Stroke  Maternal Grandmother     Past Surgical History:  Procedure Laterality Date  . ABDOMINAL HYSTERECTOMY  1992  . BREAST BIOPSY Left 03/26/2017   benign  . CARPAL TUNNEL RELEASE Bilateral   . CHOLECYSTECTOMY N/A 05/11/2016   Procedure: LAPAROSCOPIC CHOLECYSTECTOMY;  Surgeon: Erroll Luna, MD;  Location: [facility];  Service: General;  Laterality: N/A;  . GASTRIC BYPASS  2009   Social History   Occupational History  . Not on file  Tobacco Use  . Smoking status: Never Smoker  . Smokeless tobacco: Never Used  . Tobacco comment: never used tobacco  Substance and Sexual Activity  . Alcohol use: No    Alcohol/week: 0.0 standard drinks  . Drug use: No  . Sexual activity: Yes

## 2019-09-25 ENCOUNTER — Inpatient Hospital Stay: Payer: Medicare Other

## 2019-09-25 ENCOUNTER — Inpatient Hospital Stay: Payer: Medicare Other | Attending: Hematology & Oncology

## 2019-09-25 ENCOUNTER — Encounter: Payer: Self-pay | Admitting: Hematology & Oncology

## 2019-09-25 ENCOUNTER — Inpatient Hospital Stay (HOSPITAL_BASED_OUTPATIENT_CLINIC_OR_DEPARTMENT_OTHER): Payer: Medicare Other | Admitting: Hematology & Oncology

## 2019-09-25 ENCOUNTER — Telehealth: Payer: Self-pay | Admitting: Hematology & Oncology

## 2019-09-25 ENCOUNTER — Other Ambulatory Visit: Payer: Self-pay

## 2019-09-25 VITALS — BP 165/78 | HR 69 | Temp 97.3°F | Resp 18 | Wt 143.0 lb

## 2019-09-25 DIAGNOSIS — N189 Chronic kidney disease, unspecified: Secondary | ICD-10-CM | POA: Insufficient documentation

## 2019-09-25 DIAGNOSIS — D631 Anemia in chronic kidney disease: Secondary | ICD-10-CM | POA: Diagnosis present

## 2019-09-25 DIAGNOSIS — D500 Iron deficiency anemia secondary to blood loss (chronic): Secondary | ICD-10-CM

## 2019-09-25 LAB — CBC WITH DIFFERENTIAL (CANCER CENTER ONLY)
Abs Immature Granulocytes: 0.21 10*3/uL — ABNORMAL HIGH (ref 0.00–0.07)
Basophils Absolute: 0 10*3/uL (ref 0.0–0.1)
Basophils Relative: 0 %
Eosinophils Absolute: 0.1 10*3/uL (ref 0.0–0.5)
Eosinophils Relative: 1 %
HCT: 32.7 % — ABNORMAL LOW (ref 36.0–46.0)
Hemoglobin: 10.1 g/dL — ABNORMAL LOW (ref 12.0–15.0)
Immature Granulocytes: 2 %
Lymphocytes Relative: 15 %
Lymphs Abs: 1.7 10*3/uL (ref 0.7–4.0)
MCH: 28.7 pg (ref 26.0–34.0)
MCHC: 30.9 g/dL (ref 30.0–36.0)
MCV: 92.9 fL (ref 80.0–100.0)
Monocytes Absolute: 1 10*3/uL (ref 0.1–1.0)
Monocytes Relative: 8 %
Neutro Abs: 8.8 10*3/uL — ABNORMAL HIGH (ref 1.7–7.7)
Neutrophils Relative %: 74 %
Platelet Count: 227 10*3/uL (ref 150–400)
RBC: 3.52 MIL/uL — ABNORMAL LOW (ref 3.87–5.11)
RDW: 14.5 % (ref 11.5–15.5)
WBC Count: 11.8 10*3/uL — ABNORMAL HIGH (ref 4.0–10.5)
nRBC: 0 % (ref 0.0–0.2)

## 2019-09-25 LAB — CMP (CANCER CENTER ONLY)
ALT: 22 U/L (ref 0–44)
AST: 18 U/L (ref 15–41)
Albumin: 4.1 g/dL (ref 3.5–5.0)
Alkaline Phosphatase: 42 U/L (ref 38–126)
Anion gap: 7 (ref 5–15)
BUN: 40 mg/dL — ABNORMAL HIGH (ref 8–23)
CO2: 27 mmol/L (ref 22–32)
Calcium: 9.6 mg/dL (ref 8.9–10.3)
Chloride: 104 mmol/L (ref 98–111)
Creatinine: 1.3 mg/dL — ABNORMAL HIGH (ref 0.44–1.00)
GFR, Est AFR Am: 50 mL/min — ABNORMAL LOW (ref >60)
GFR, Estimated: 43 mL/min — ABNORMAL LOW (ref >60)
Glucose, Bld: 137 mg/dL — ABNORMAL HIGH (ref 70–99)
Potassium: 5.3 mmol/L — ABNORMAL HIGH (ref 3.5–5.1)
Sodium: 138 mmol/L (ref 135–145)
Total Bilirubin: 0.3 mg/dL (ref 0.3–1.2)
Total Protein: 6.8 g/dL (ref 6.5–8.1)

## 2019-09-25 LAB — RETICULOCYTES
Immature Retic Fract: 11.6 % (ref 2.3–15.9)
RBC.: 3.52 MIL/uL — ABNORMAL LOW (ref 3.87–5.11)
Retic Count, Absolute: 41.5 10*3/uL (ref 19.0–186.0)
Retic Ct Pct: 1.2 % (ref 0.4–3.1)

## 2019-09-25 LAB — IRON AND TIBC
Iron: 94 ug/dL (ref 41–142)
Saturation Ratios: 31 % (ref 21–57)
TIBC: 304 ug/dL (ref 236–444)
UIBC: 210 ug/dL (ref 120–384)

## 2019-09-25 LAB — FERRITIN: Ferritin: 587 ng/mL — ABNORMAL HIGH (ref 11–307)

## 2019-09-25 MED ORDER — EPOETIN ALFA-EPBX 40000 UNIT/ML IJ SOLN
40000.0000 [IU] | Freq: Once | INTRAMUSCULAR | Status: AC
  Administered 2019-09-25: 09:00:00 40000 [IU] via SUBCUTANEOUS

## 2019-09-25 MED ORDER — EPOETIN ALFA-EPBX 40000 UNIT/ML IJ SOLN
INTRAMUSCULAR | Status: AC
  Filled 2019-09-25: qty 1

## 2019-09-25 MED ORDER — DARBEPOETIN ALFA 300 MCG/0.6ML IJ SOSY
PREFILLED_SYRINGE | INTRAMUSCULAR | Status: AC
  Filled 2019-09-25: qty 0.6

## 2019-09-25 NOTE — Patient Instructions (Signed)
Darbepoetin Alfa injection What is this medicine? DARBEPOETIN ALFA (dar be POE e tin AL fa) helps your body make more red blood cells. It is used to treat anemia caused by chronic kidney failure and chemotherapy. This medicine may be used for other purposes; ask your health care provider or pharmacist if you have questions. COMMON BRAND NAME(S): Aranesp What should I tell my health care provider before I take this medicine? They need to know if you have any of these conditions:  blood clotting disorders or history of blood clots  cancer patient not on chemotherapy  cystic fibrosis  heart disease, such as angina, heart failure, or a history of a heart attack  hemoglobin level of 12 g/dL or greater  high blood pressure  low levels of folate, iron, or vitamin B12  seizures  an unusual or allergic reaction to darbepoetin, erythropoietin, albumin, hamster proteins, latex, other medicines, foods, dyes, or preservatives  pregnant or trying to get pregnant  breast-feeding How should I use this medicine? This medicine is for injection into a vein or under the skin. It is usually given by a health care professional in a hospital or clinic setting. If you get this medicine at home, you will be taught how to prepare and give this medicine. Use exactly as directed. Take your medicine at regular intervals. Do not take your medicine more often than directed. It is important that you put your used needles and syringes in a special sharps container. Do not put them in a trash can. If you do not have a sharps container, call your pharmacist or healthcare provider to get one. A special MedGuide will be given to you by the pharmacist with each prescription and refill. Be sure to read this information carefully each time. Talk to your pediatrician regarding the use of this medicine in children. While this medicine may be used in children as young as 1 month of age for selected conditions, precautions do  apply. Overdosage: If you think you have taken too much of this medicine contact a poison control center or emergency room at once. NOTE: This medicine is only for you. Do not share this medicine with others. What if I miss a dose? If you miss a dose, take it as soon as you can. If it is almost time for your next dose, take only that dose. Do not take double or extra doses. What may interact with this medicine? Do not take this medicine with any of the following medications:  epoetin alfa This list may not describe all possible interactions. Give your health care provider a list of all the medicines, herbs, non-prescription drugs, or dietary supplements you use. Also tell them if you smoke, drink alcohol, or use illegal drugs. Some items may interact with your medicine. What should I watch for while using this medicine? Your condition will be monitored carefully while you are receiving this medicine. You may need blood work done while you are taking this medicine. This medicine may cause a decrease in vitamin B6. You should make sure that you get enough vitamin B6 while you are taking this medicine. Discuss the foods you eat and the vitamins you take with your health care professional. What side effects may I notice from receiving this medicine? Side effects that you should report to your doctor or health care professional as soon as possible:  allergic reactions like skin rash, itching or hives, swelling of the face, lips, or tongue  breathing problems  changes in   vision  chest pain  confusion, trouble speaking or understanding  feeling faint or lightheaded, falls  high blood pressure  muscle aches or pains  pain, swelling, warmth in the leg  rapid weight gain  severe headaches  sudden numbness or weakness of the face, arm or leg  trouble walking, dizziness, loss of balance or coordination  seizures (convulsions)  swelling of the ankles, feet, hands  unusually weak or  tired Side effects that usually do not require medical attention (report to your doctor or health care professional if they continue or are bothersome):  diarrhea  fever, chills (flu-like symptoms)  headaches  nausea, vomiting  redness, stinging, or swelling at site where injected This list may not describe all possible side effects. Call your doctor for medical advice about side effects. You may report side effects to FDA at 1-800-FDA-1088. Where should I keep my medicine? Keep out of the reach of children. Store in a refrigerator between 2 and 8 degrees C (36 and 46 degrees F). Do not freeze. Do not shake. Throw away any unused portion if using a single-dose vial. Throw away any unused medicine after the expiration date. NOTE: This sheet is a summary. It may not cover all possible information. If you have questions about this medicine, talk to your doctor, pharmacist, or health care provider.  2020 Elsevier/Gold Standard (2017-06-27 16:44:20)  

## 2019-09-25 NOTE — Progress Notes (Signed)
Ok to treat with BP 169/69 per Dr. Marin Olp

## 2019-09-25 NOTE — Telephone Encounter (Signed)
Appointments schedule calendar printed per 4/1 los

## 2019-09-25 NOTE — Progress Notes (Signed)
Hematology and Oncology Follow Up Visit  Kelly Soto [id number] [birth year] 64 y.o. 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 is back for follow-up.  She feels pretty good.  She is looking forward to the [place] weekend.  Her puppy now is a-year-old.  She he now is 65 pounds.  They really are having a good time with him.  Her blood sugars have been doing pretty well.  She does feel little bit tired.  I suspect she probably will need a dose of Aranesp today as her hemoglobin was only 10.3.  She has had no problems with bleeding.  There is been no problems with nausea or vomiting.  There is been no change in bowel or bladder habits.  Her potassium is always on the higher side.  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  Overall, her performance status is ECOG 1.    Medications:  Current Outpatient Medications:  .  acetaminophen (TYLENOL) 500 MG chewable tablet, Chew 500-1,000 mg by mouth every 6 (six) hours as needed for pain (depends on pain level)., Disp: , Rfl:  .  baclofen (LIORESAL) 10 MG tablet, Take 10 mg by mouth daily as needed. migraines, Disp: , Rfl:  .  BD PEN NEEDLE NANO U/F 32G X 4 MM MISC, USE 1 ONCE DAILY WITH TRESIBA, Disp: , Rfl:  .  Blood Glucose Monitoring Suppl (CONTOUR NEXT EZ) w/Device KIT, USE TO CHECK BLOOD SUGARS DAILY, Disp: , Rfl:  .  CONTOUR NEXT TEST test strip, USE STRIP TO CHECK GLUCOSE ONCE DAILY, Disp: , Rfl:  .  cyclobenzaprine (FLEXERIL) 10 MG tablet, 1 po q hs prn, Disp: 15 tablet, Rfl: 0 .  denosumab (PROLIA) 60 MG/ML SOLN injection, Inject 60 mg into the skin every 6 (six) months.  Administer in upper arm, thigh, or abdomen  Next injection is due 05-2016, Disp: , Rfl:  .  diazepam (VALIUM) 5 MG tablet, Take 5 mg by mouth daily as needed for anxiety. , Disp: , Rfl: 0 .  fluticasone (FLONASE) 50 MCG/ACT nasal spray, Place 2 sprays into both nostrils at bedtime., Disp: , Rfl:  .  Galcanezumab-gnlm (EMGALITY) 120 MG/ML SOSY, Inject 120 mg/mL into the skin every 30 (thirty) days., Disp: , Rfl:  .  glyBURIDE-metformin (GLUCOVANCE) 2.5-500 MG per tablet, Take 2 tablets by mouth 2 (two) times daily., Disp: , Rfl:  .  GLYXAMBI 25-5 MG TABS, Take 1 tablet by mouth every morning., Disp: , Rfl: 3 .  hydroxypropyl methylcellulose / hypromellose (ISOPTO TEARS / GONIOVISC) 2.5 % ophthalmic solution, Place 1 drop into both eyes 3 (three) times daily., Disp: , Rfl:  .  methocarbamol (ROBAXIN) 500 MG tablet, Take 1 tablet (500 mg total) by mouth every 8 (eight) hours as needed for muscle spasms., Disp: 30 tablet, Rfl: 0 .  metoprolol succinate (TOPROL-XL) 25 MG 24 hr tablet, Take 25 mg by mouth every morning., Disp: , Rfl:  .  Microlet Lancets MISC, USE AS DIRECTED TO CHECK BLOOD SUGAR, Disp: , Rfl:  .  Multiple Vitamin (MULTI-VITAMINS) TABS, Take by mouth., Disp: , Rfl:  .  omeprazole (  PRILOSEC) 40 MG capsule, Take 40 mg by mouth 2 (two) times daily., Disp: , Rfl:  .  oxyCODONE (ROXICODONE) 5 MG immediate release tablet, Take 1 tablet (5 mg total) by mouth every 4 (four) hours as needed., Disp: 35 tablet, Rfl: 0 .  pioglitazone (ACTOS) 15 MG tablet, Take 15 mg by mouth daily., Disp: , Rfl:  .  tobramycin (TOBREX) 0.3 % ophthalmic solution, 1 drop 4 (four) times daily., Disp: , Rfl:  .  traMADol (ULTRAM) 50 MG tablet, 1 po q hs prn, Disp: 30 tablet, Rfl: 0 .  traZODone (DESYREL) 50 MG tablet, , Disp: , Rfl:  .  TRESIBA FLEXTOUCH 100 UNIT/ML SOPN FlexTouch Pen, Inject 10 Units into the skin daily., Disp: , Rfl:  .  XARELTO 10 MG TABS tablet, TAKE 1 TABLET BY MOUTH EVERY DAY, Disp: 21 tablet,  Rfl: 0  Allergies:  Allergies  Allergen Reactions  . Ace Inhibitors Other (See Comments) and Anaphylaxis    Angioedema  . Aspirin Other (See Comments)    Cannot take due to gastric bypass surgery  . Nsaids Anaphylaxis and Other (See Comments)    S/p gastric bypass Cannot take orally due to gastric bypass S/p gastric bypass  . Sular [Nisoldipine Er] Other (See Comments)    angioedema  . Boniva [Ibandronic Acid] Other (See Comments)    Joint pain    Past Medical History, Surgical history, Social history, and Family History were reviewed and updated.  Review of Systems: Review of Systems  Constitutional: Negative.   HENT: Negative.   Eyes: Negative.   Respiratory: Negative.   Cardiovascular: Negative.   Gastrointestinal: Negative.   Genitourinary: Negative.   Musculoskeletal: Negative.   Skin: Negative.   Neurological: Negative.   Endo/Heme/Allergies: Negative.   Psychiatric/Behavioral: Negative.     Physical Exam:  vitals were not taken for this visit.   Physical Exam Vitals reviewed.  HENT:     Head: Normocephalic and atraumatic.  Eyes:     Pupils: Pupils are equal, round, and reactive to light.  Cardiovascular:     Rate and Rhythm: Normal rate and regular rhythm.     Heart sounds: Normal heart sounds.  Pulmonary:     Effort: Pulmonary effort is normal.     Breath sounds: Normal breath sounds.  Abdominal:     General: Bowel sounds are normal.     Palpations: Abdomen is soft.  Musculoskeletal:        General: No tenderness or deformity. Normal range of motion.     Cervical back: Normal range of motion.  Lymphadenopathy:     Cervical: No cervical adenopathy.  Skin:    General: Skin is warm and dry.     Findings: No erythema or rash.  Neurological:     Mental Status: She is alert and oriented to person, place, and time.  Psychiatric:        Behavior: Behavior normal.        Thought Content: Thought content normal.        Judgment: Judgment normal.       Lab Results  Component Value Date   WBC 11.8 (H) 09/25/2019   HGB 10.1 (L) 09/25/2019   HCT 32.7 (L) 09/25/2019   MCV 92.9 09/25/2019   PLT 227 09/25/2019     Chemistry      Component Value Date/Time   NA 141 07/29/2019 0845   NA 145 05/10/2017 0748   NA 137 12/06/2016 0746   K 5.6 (H) 07/29/2019 0845  K 4.7 05/10/2017 0748   K 5.2 (H) 12/06/2016 0746   CL 106 07/29/2019 0845   CL 110 (H) 05/10/2017 0748   CO2 26 07/29/2019 0845   CO2 23 05/10/2017 0748   CO2 23 12/06/2016 0746   BUN 37 (H) 07/29/2019 0845   BUN 28 (H) 05/10/2017 0748   BUN 26.1 (H) 12/06/2016 0746   CREATININE 1.36 (H) 07/29/2019 0845   CREATININE 1.2 05/10/2017 0748   CREATININE 1.2 (H) 12/06/2016 0746      Component Value Date/Time   CALCIUM 10.1 07/29/2019 0845   CALCIUM 9.1 05/10/2017 0748   CALCIUM 9.1 12/06/2016 0746   ALKPHOS 46 07/29/2019 0845   ALKPHOS 61 05/10/2017 0748   ALKPHOS 41 12/06/2016 0746   AST 16 07/29/2019 0845   AST 23 12/06/2016 0746   ALT 18 07/29/2019 0845   ALT 31 05/10/2017 0748   ALT 26 12/06/2016 0746   BILITOT 0.2 (L) 07/29/2019 0845   BILITOT 0.26 12/06/2016 0746         Impression and Plan: Ms. Rahl is 64 year old white female. She has diabetes. She has iron deficiency anemia. She has a very low erythropoietin level.  For right now, she does need Aranesp.  I will go ahead and give her the Aranesp.  Her blood pressure is on the high side but this typically is elevated when she gets to see [place].  We will see what her iron levels are.  I would think that the iron should be okay.  If not, we will get her back.  We will have her come in 6 weeks from now.   Volanda Napoleon, MD 4/1/20218:11 AM

## 2019-09-26 ENCOUNTER — Ambulatory Visit: Payer: Medicare Other | Admitting: Family

## 2019-09-26 ENCOUNTER — Ambulatory Visit: Payer: Medicare Other

## 2019-09-26 ENCOUNTER — Other Ambulatory Visit: Payer: Medicare Other

## 2019-11-07 ENCOUNTER — Inpatient Hospital Stay: Payer: Medicare Other

## 2019-11-07 ENCOUNTER — Inpatient Hospital Stay: Payer: Medicare Other | Attending: Hematology & Oncology

## 2019-11-07 ENCOUNTER — Inpatient Hospital Stay (HOSPITAL_BASED_OUTPATIENT_CLINIC_OR_DEPARTMENT_OTHER): Payer: Medicare Other | Admitting: Hematology & Oncology

## 2019-11-07 ENCOUNTER — Other Ambulatory Visit: Payer: Self-pay

## 2019-11-07 ENCOUNTER — Encounter: Payer: Self-pay | Admitting: Hematology & Oncology

## 2019-11-07 VITALS — BP 166/78 | HR 75 | Temp 97.3°F | Resp 18 | Wt 148.0 lb

## 2019-11-07 DIAGNOSIS — N189 Chronic kidney disease, unspecified: Secondary | ICD-10-CM

## 2019-11-07 DIAGNOSIS — N1831 Chronic kidney disease, stage 3a: Secondary | ICD-10-CM

## 2019-11-07 DIAGNOSIS — D631 Anemia in chronic kidney disease: Secondary | ICD-10-CM | POA: Diagnosis present

## 2019-11-07 LAB — CMP (CANCER CENTER ONLY)
ALT: 23 U/L (ref 0–44)
AST: 27 U/L (ref 15–41)
Albumin: 4 g/dL (ref 3.5–5.0)
Alkaline Phosphatase: 43 U/L (ref 38–126)
Anion gap: 8 (ref 5–15)
BUN: 37 mg/dL — ABNORMAL HIGH (ref 8–23)
CO2: 26 mmol/L (ref 22–32)
Calcium: 9.7 mg/dL (ref 8.9–10.3)
Chloride: 106 mmol/L (ref 98–111)
Creatinine: 1.39 mg/dL — ABNORMAL HIGH (ref 0.44–1.00)
GFR, Est AFR Am: 46 mL/min — ABNORMAL LOW (ref >60)
GFR, Estimated: 40 mL/min — ABNORMAL LOW (ref >60)
Glucose, Bld: 157 mg/dL — ABNORMAL HIGH (ref 70–99)
Potassium: 5.5 mmol/L — ABNORMAL HIGH (ref 3.5–5.1)
Sodium: 140 mmol/L (ref 135–145)
Total Bilirubin: 0.2 mg/dL — ABNORMAL LOW (ref 0.3–1.2)
Total Protein: 6.5 g/dL (ref 6.5–8.1)

## 2019-11-07 LAB — CBC WITH DIFFERENTIAL (CANCER CENTER ONLY)
Abs Immature Granulocytes: 0.16 10*3/uL — ABNORMAL HIGH (ref 0.00–0.07)
Basophils Absolute: 0.1 10*3/uL (ref 0.0–0.1)
Basophils Relative: 1 %
Eosinophils Absolute: 0.1 10*3/uL (ref 0.0–0.5)
Eosinophils Relative: 1 %
HCT: 35.5 % — ABNORMAL LOW (ref 36.0–46.0)
Hemoglobin: 10.8 g/dL — ABNORMAL LOW (ref 12.0–15.0)
Immature Granulocytes: 2 %
Lymphocytes Relative: 16 %
Lymphs Abs: 1.5 10*3/uL (ref 0.7–4.0)
MCH: 29 pg (ref 26.0–34.0)
MCHC: 30.4 g/dL (ref 30.0–36.0)
MCV: 95.4 fL (ref 80.0–100.0)
Monocytes Absolute: 0.7 10*3/uL (ref 0.1–1.0)
Monocytes Relative: 7 %
Neutro Abs: 6.8 10*3/uL (ref 1.7–7.7)
Neutrophils Relative %: 73 %
Platelet Count: 216 10*3/uL (ref 150–400)
RBC: 3.72 MIL/uL — ABNORMAL LOW (ref 3.87–5.11)
RDW: 14.7 % (ref 11.5–15.5)
WBC Count: 9.2 10*3/uL (ref 4.0–10.5)
nRBC: 0 % (ref 0.0–0.2)

## 2019-11-07 LAB — RETICULOCYTES
Immature Retic Fract: 7.6 % (ref 2.3–15.9)
RBC.: 3.72 MIL/uL — ABNORMAL LOW (ref 3.87–5.11)
Retic Count, Absolute: 38.7 10*3/uL (ref 19.0–186.0)
Retic Ct Pct: 1 % (ref 0.4–3.1)

## 2019-11-07 LAB — IRON AND TIBC
Iron: 83 ug/dL (ref 41–142)
Saturation Ratios: 27 % (ref 21–57)
TIBC: 311 ug/dL (ref 236–444)
UIBC: 228 ug/dL (ref 120–384)

## 2019-11-07 LAB — FERRITIN: Ferritin: 538 ng/mL — ABNORMAL HIGH (ref 11–307)

## 2019-11-07 MED ORDER — EPOETIN ALFA-EPBX 40000 UNIT/ML IJ SOLN
40000.0000 [IU] | Freq: Once | INTRAMUSCULAR | Status: AC
  Administered 2019-11-07: 40000 [IU] via SUBCUTANEOUS

## 2019-11-07 MED ORDER — EPOETIN ALFA-EPBX 40000 UNIT/ML IJ SOLN
INTRAMUSCULAR | Status: AC
  Filled 2019-11-07: qty 1

## 2019-11-07 NOTE — Progress Notes (Signed)
Hematology and Oncology Follow Up Visit  Kelly Soto [id number] 1955-08-09 64 y.o. 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  Kelly Soto is back for follow-up.  She feels pretty good.  There has been no problems with bleeding.  Her blood sugars have been under fairly good control.  When we last saw her back in early April, her iron studies showed a ferritin of 587 with iron saturation of 31%.  Her pump is now-year-old.  He is doing quite well.  He goes to doggy daycare.  He really enjoys this.  She has had no problems with fever.  She has had a coronavirus vaccines.  There has been no change in bowel or bladder habits.  She has had no tingling or numbness in the hands or feet.  Overall, her performance status is ECOG 1.     Medications:  Current Outpatient Medications:  .  acetaminophen (TYLENOL) 500 MG chewable tablet, Chew 500-1,000 mg by mouth every 6 (six) hours as needed for pain (depends on pain level)., Disp: , Rfl:  .  baclofen (LIORESAL) 10 MG tablet, Take 10 mg by mouth daily as needed. migraines, Disp: , Rfl:  .  BD PEN NEEDLE NANO U/F 32G X 4 MM MISC, USE 1 ONCE DAILY WITH TRESIBA, Disp: , Rfl:  .  Blood Glucose Monitoring Suppl (CONTOUR NEXT EZ) w/Device KIT, USE TO CHECK BLOOD SUGARS DAILY, Disp: , Rfl:  .  CONTOUR NEXT TEST test strip, USE STRIP TO CHECK GLUCOSE ONCE DAILY, Disp: , Rfl:  .  denosumab (PROLIA) 60 MG/ML SOLN injection, Inject 60 mg into the skin every 6 (six) months. Administer in upper arm, thigh, or abdomen  Next injection is due 05-2016, Disp: , Rfl:  .  diazepam (VALIUM) 5 MG tablet, Take 5 mg by mouth daily as needed for anxiety. , Disp: ,  Rfl: 0 .  fluticasone (FLONASE) 50 MCG/ACT nasal spray, Place 2 sprays into both nostrils at bedtime., Disp: , Rfl:  .  Galcanezumab-gnlm (EMGALITY) 120 MG/ML SOSY, Inject 120 mg/mL into the skin every 30 (thirty) days., Disp: , Rfl:  .  glyBURIDE-metformin (GLUCOVANCE) 2.5-500 MG per tablet, Take 2 tablets by mouth 2 (two) times daily., Disp: , Rfl:  .  GLYXAMBI 25-5 MG TABS, Take 1 tablet by mouth every morning., Disp: , Rfl: 3 .  hydroxypropyl methylcellulose / hypromellose (ISOPTO TEARS / GONIOVISC) 2.5 % ophthalmic solution, Place 1 drop into both eyes 3 (three) times daily., Disp: , Rfl:  .  metoprolol succinate (TOPROL-XL) 25 MG 24 hr tablet, Take 25 mg by mouth every morning., Disp: , Rfl:  .  Microlet Lancets MISC, USE AS DIRECTED TO CHECK BLOOD SUGAR, Disp: , Rfl:  .  Multiple Vitamin (MULTI-VITAMINS) TABS, Take by mouth., Disp: , Rfl:  .  omeprazole (PRILOSEC) 40 MG capsule, Take 40 mg by mouth 2 (two) times daily., Disp: , Rfl:  .  pioglitazone (ACTOS) 15 MG tablet, Take 15 mg by mouth daily., Disp: , Rfl:  .  tobramycin (TOBREX) 0.3 % ophthalmic solution, 1 drop 4 (four) times daily., Disp: , Rfl:  .  traMADol (ULTRAM) 50 MG tablet, 1 po q hs prn,  Disp: 30 tablet, Rfl: 0 .  traZODone (DESYREL) 50 MG tablet, , Disp: , Rfl:  .  TRESIBA FLEXTOUCH 100 UNIT/ML SOPN FlexTouch Pen, Inject 10 Units into the skin daily., Disp: , Rfl:   Allergies:  Allergies  Allergen Reactions  . Ace Inhibitors Other (See Comments) and Anaphylaxis    Angioedema  . Aspirin Other (See Comments)    Cannot take due to gastric bypass surgery  . Nsaids Anaphylaxis and Other (See Comments)    S/p gastric bypass Cannot take orally due to gastric bypass S/p gastric bypass  . Sular [Nisoldipine Er] Other (See Comments)    angioedema  . Boniva [Ibandronic Acid] Other (See Comments)    Joint pain    Past Medical History, Surgical history, Social history, and Family History were reviewed and  updated.  Review of Systems: Review of Systems  Constitutional: Negative.   HENT: Negative.   Eyes: Negative.   Respiratory: Negative.   Cardiovascular: Negative.   Gastrointestinal: Negative.   Genitourinary: Negative.   Musculoskeletal: Negative.   Skin: Negative.   Neurological: Negative.   Endo/Heme/Allergies: Negative.   Psychiatric/Behavioral: Negative.     Physical Exam:  vitals were not taken for this visit.   Physical Exam Vitals reviewed.  HENT:     Head: Normocephalic and atraumatic.  Eyes:     Pupils: Pupils are equal, round, and reactive to light.  Cardiovascular:     Rate and Rhythm: Normal rate and regular rhythm.     Heart sounds: Normal heart sounds.  Pulmonary:     Effort: Pulmonary effort is normal.     Breath sounds: Normal breath sounds.  Abdominal:     General: Bowel sounds are normal.     Palpations: Abdomen is soft.  Musculoskeletal:        General: No tenderness or deformity. Normal range of motion.     Cervical back: Normal range of motion.  Lymphadenopathy:     Cervical: No cervical adenopathy.  Skin:    General: Skin is warm and dry.     Findings: No erythema or rash.  Neurological:     Mental Status: She is alert and oriented to person, place, and time.  Psychiatric:        Behavior: Behavior normal.        Thought Content: Thought content normal.        Judgment: Judgment normal.      Lab Results  Component Value Date   WBC 9.2 11/07/2019   HGB 10.8 (L) 11/07/2019   HCT 35.5 (L) 11/07/2019   MCV 95.4 11/07/2019   PLT 216 11/07/2019     Chemistry      Component Value Date/Time   NA 138 09/25/2019 0750   NA 145 05/10/2017 0748   NA 137 12/06/2016 0746   K 5.3 (H) 09/25/2019 0750   K 4.7 05/10/2017 0748   K 5.2 (H) 12/06/2016 0746   CL 104 09/25/2019 0750   CL 110 (H) 05/10/2017 0748   CO2 27 09/25/2019 0750   CO2 23 05/10/2017 0748   CO2 23 12/06/2016 0746   BUN 40 (H) 09/25/2019 0750   BUN 28 (H) 05/10/2017 0748    BUN 26.1 (H) 12/06/2016 0746   CREATININE 1.30 (H) 09/25/2019 0750   CREATININE 1.2 05/10/2017 0748   CREATININE 1.2 (H) 12/06/2016 0746      Component Value Date/Time   CALCIUM 9.6 09/25/2019 0750   CALCIUM 9.1 05/10/2017 0748   CALCIUM 9.1 12/06/2016 0746  ALKPHOS 42 09/25/2019 0750   ALKPHOS 61 05/10/2017 0748   ALKPHOS 41 12/06/2016 0746   AST 18 09/25/2019 0750   AST 23 12/06/2016 0746   ALT 22 09/25/2019 0750   ALT 31 05/10/2017 0748   ALT 26 12/06/2016 0746   BILITOT 0.3 09/25/2019 0750   BILITOT 0.26 12/06/2016 0746         Impression and Plan: Ms. Cornia is 64 year old white female. She has diabetes. She has iron deficiency anemia. She has a very low erythropoietin level.  For right now, she does need Aranesp.  I will go ahead and give her the Aranesp.  Her blood pressure is on the high side but this typically is elevated when she gets to see [place].  She says that she normally has a couple coffee before she comes to our office.  We will see what her iron levels are.  I would think that the iron should be okay.  If not, we will get her back.  We will have her come in 6 weeks from now.   Volanda Napoleon, MD 5/14/20218:40 AM

## 2019-11-07 NOTE — Patient Instructions (Signed)

## 2019-11-17 ENCOUNTER — Other Ambulatory Visit: Payer: Self-pay

## 2019-11-27 ENCOUNTER — Other Ambulatory Visit: Payer: Self-pay | Admitting: Family Medicine

## 2019-11-27 DIAGNOSIS — Z1231 Encounter for screening mammogram for malignant neoplasm of breast: Secondary | ICD-10-CM

## 2019-12-19 ENCOUNTER — Other Ambulatory Visit: Payer: BLUE CROSS/BLUE SHIELD

## 2019-12-19 ENCOUNTER — Ambulatory Visit: Payer: BLUE CROSS/BLUE SHIELD

## 2019-12-19 ENCOUNTER — Ambulatory Visit: Payer: BLUE CROSS/BLUE SHIELD | Admitting: Hematology & Oncology

## 2019-12-22 ENCOUNTER — Ambulatory Visit (INDEPENDENT_AMBULATORY_CARE_PROVIDER_SITE_OTHER): Payer: Medicare Other | Admitting: Orthopedic Surgery

## 2019-12-22 DIAGNOSIS — M1711 Unilateral primary osteoarthritis, right knee: Secondary | ICD-10-CM

## 2019-12-23 ENCOUNTER — Inpatient Hospital Stay: Payer: Medicare Other | Attending: Hematology & Oncology

## 2019-12-23 ENCOUNTER — Inpatient Hospital Stay (HOSPITAL_BASED_OUTPATIENT_CLINIC_OR_DEPARTMENT_OTHER): Payer: Medicare Other | Admitting: Hematology & Oncology

## 2019-12-23 ENCOUNTER — Other Ambulatory Visit: Payer: Self-pay

## 2019-12-23 ENCOUNTER — Telehealth: Payer: Self-pay | Admitting: *Deleted

## 2019-12-23 ENCOUNTER — Encounter: Payer: Self-pay | Admitting: Hematology & Oncology

## 2019-12-23 ENCOUNTER — Inpatient Hospital Stay: Payer: Medicare Other

## 2019-12-23 VITALS — BP 161/67 | HR 67 | Temp 96.9°F | Resp 18 | Wt 146.0 lb

## 2019-12-23 DIAGNOSIS — N189 Chronic kidney disease, unspecified: Secondary | ICD-10-CM

## 2019-12-23 DIAGNOSIS — Z794 Long term (current) use of insulin: Secondary | ICD-10-CM | POA: Diagnosis not present

## 2019-12-23 DIAGNOSIS — D631 Anemia in chronic kidney disease: Secondary | ICD-10-CM | POA: Diagnosis not present

## 2019-12-23 DIAGNOSIS — D509 Iron deficiency anemia, unspecified: Secondary | ICD-10-CM | POA: Insufficient documentation

## 2019-12-23 DIAGNOSIS — E119 Type 2 diabetes mellitus without complications: Secondary | ICD-10-CM | POA: Insufficient documentation

## 2019-12-23 DIAGNOSIS — N1831 Chronic kidney disease, stage 3a: Secondary | ICD-10-CM

## 2019-12-23 LAB — RETICULOCYTES
Immature Retic Fract: 9.5 % (ref 2.3–15.9)
RBC.: 3.82 MIL/uL — ABNORMAL LOW (ref 3.87–5.11)
Retic Count, Absolute: 38.2 10*3/uL (ref 19.0–186.0)
Retic Ct Pct: 1 % (ref 0.4–3.1)

## 2019-12-23 LAB — CMP (CANCER CENTER ONLY)
ALT: 19 U/L (ref 0–44)
AST: 17 U/L (ref 15–41)
Albumin: 4.3 g/dL (ref 3.5–5.0)
Alkaline Phosphatase: 46 U/L (ref 38–126)
Anion gap: 8 (ref 5–15)
BUN: 44 mg/dL — ABNORMAL HIGH (ref 8–23)
CO2: 26 mmol/L (ref 22–32)
Calcium: 10.3 mg/dL (ref 8.9–10.3)
Chloride: 102 mmol/L (ref 98–111)
Creatinine: 1.36 mg/dL — ABNORMAL HIGH (ref 0.44–1.00)
GFR, Est AFR Am: 47 mL/min — ABNORMAL LOW (ref >60)
GFR, Estimated: 41 mL/min — ABNORMAL LOW (ref >60)
Glucose, Bld: 215 mg/dL — ABNORMAL HIGH (ref 70–99)
Potassium: 5.2 mmol/L — ABNORMAL HIGH (ref 3.5–5.1)
Sodium: 136 mmol/L (ref 135–145)
Total Bilirubin: 0.2 mg/dL — ABNORMAL LOW (ref 0.3–1.2)
Total Protein: 7.4 g/dL (ref 6.5–8.1)

## 2019-12-23 LAB — CBC WITH DIFFERENTIAL (CANCER CENTER ONLY)
Abs Immature Granulocytes: 0.05 10*3/uL (ref 0.00–0.07)
Basophils Absolute: 0 10*3/uL (ref 0.0–0.1)
Basophils Relative: 0 %
Eosinophils Absolute: 0 10*3/uL (ref 0.0–0.5)
Eosinophils Relative: 0 %
HCT: 36.2 % (ref 36.0–46.0)
Hemoglobin: 11.3 g/dL — ABNORMAL LOW (ref 12.0–15.0)
Immature Granulocytes: 1 %
Lymphocytes Relative: 11 %
Lymphs Abs: 1.1 10*3/uL (ref 0.7–4.0)
MCH: 29.4 pg (ref 26.0–34.0)
MCHC: 31.2 g/dL (ref 30.0–36.0)
MCV: 94 fL (ref 80.0–100.0)
Monocytes Absolute: 0.6 10*3/uL (ref 0.1–1.0)
Monocytes Relative: 6 %
Neutro Abs: 8.7 10*3/uL — ABNORMAL HIGH (ref 1.7–7.7)
Neutrophils Relative %: 82 %
Platelet Count: 222 10*3/uL (ref 150–400)
RBC: 3.85 MIL/uL — ABNORMAL LOW (ref 3.87–5.11)
RDW: 14.2 % (ref 11.5–15.5)
WBC Count: 10.6 10*3/uL — ABNORMAL HIGH (ref 4.0–10.5)
nRBC: 0 % (ref 0.0–0.2)

## 2019-12-23 LAB — IRON AND TIBC
Iron: 86 ug/dL (ref 41–142)
Saturation Ratios: 26 % (ref 21–57)
TIBC: 336 ug/dL (ref 236–444)
UIBC: 250 ug/dL (ref 120–384)

## 2019-12-23 LAB — FERRITIN: Ferritin: 501 ng/mL — ABNORMAL HIGH (ref 11–307)

## 2019-12-23 NOTE — Telephone Encounter (Signed)
-----   Message from Volanda Napoleon, MD sent at 12/23/2019  1:49 PM EDT ----- Call - iron level is ok!!  Laurey Arrow

## 2019-12-23 NOTE — Telephone Encounter (Signed)
As noted below by Dr. Marin Olp, I informed the patient of her iron level. She verbalized understanding.

## 2019-12-23 NOTE — Progress Notes (Signed)
Hematology and Oncology Follow Up Visit  Kelly Soto [id number] 1956/03/16 64 y.o. 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  Kelly Soto is back for follow-up.  She feels pretty good.  She is doing well this summer.  She enjoys her little dog.  He is now-year-old.  Her blood sugars this morning at home were 233.  This is a little on the higher side.  She has had no problems with bowels or bladder.  There is been no cough or shortness of breath.  She has had no issues with fever.  She has had no nausea or vomiting.  There is been no leg swelling.  Her last iron studies back in May showed a ferritin of 538 with an iron saturation of 27%.    Overall, her performance status is ECOG 1.     Medications:  Current Outpatient Medications:  .  acetaminophen (TYLENOL) 500 MG chewable tablet, Chew 500-1,000 mg by mouth every 6 (six) hours as needed for pain (depends on pain level)., Disp: , Rfl:  .  baclofen (LIORESAL) 10 MG tablet, Take 10 mg by mouth daily as needed. migraines, Disp: , Rfl:  .  BD PEN NEEDLE NANO U/F 32G X 4 MM MISC, USE 1 ONCE DAILY WITH TRESIBA, Disp: , Rfl:  .  Blood Glucose Monitoring Suppl (CONTOUR NEXT EZ) w/Device KIT, USE TO CHECK BLOOD SUGARS DAILY, Disp: , Rfl:  .  CONTOUR NEXT TEST test strip, USE STRIP TO CHECK GLUCOSE ONCE DAILY, Disp: , Rfl:  .  denosumab (PROLIA) 60 MG/ML SOLN injection, Inject 60 mg into the skin every 6 (six) months. Administer in upper arm, thigh, or abdomen  Next injection is due 05-2016, Disp: , Rfl:  .  diazepam (VALIUM) 5 MG tablet, Take 5 mg by mouth daily as needed for anxiety. , Disp: , Rfl: 0 .  fluticasone (FLONASE) 50 MCG/ACT  nasal spray, Place 2 sprays into both nostrils at bedtime., Disp: , Rfl:  .  Galcanezumab-gnlm (EMGALITY) 120 MG/ML SOSY, Inject 120 mg/mL into the skin every 30 (thirty) days., Disp: , Rfl:  .  glyBURIDE-metformin (GLUCOVANCE) 2.5-500 MG per tablet, Take 2 tablets by mouth 2 (two) times daily., Disp: , Rfl:  .  GLYXAMBI 25-5 MG TABS, Take 1 tablet by mouth every morning., Disp: , Rfl: 3 .  hydroxypropyl methylcellulose / hypromellose (ISOPTO TEARS / GONIOVISC) 2.5 % ophthalmic solution, Place 1 drop into both eyes 3 (three) times daily., Disp: , Rfl:  .  metoprolol succinate (TOPROL-XL) 25 MG 24 hr tablet, Take 25 mg by mouth every morning., Disp: , Rfl:  .  Microlet Lancets MISC, USE AS DIRECTED TO CHECK BLOOD SUGAR, Disp: , Rfl:  .  Multiple Vitamin (MULTI-VITAMINS) TABS, Take by mouth., Disp: , Rfl:  .  omeprazole (PRILOSEC) 40 MG capsule, Take 40 mg by mouth 2 (two) times daily., Disp: , Rfl:  .  pioglitazone (ACTOS) 15 MG tablet, Take 15 mg by mouth daily., Disp: , Rfl:  .  tobramycin (TOBREX) 0.3 % ophthalmic solution, 1 drop 4 (four) times daily., Disp: , Rfl:  .  TRESIBA FLEXTOUCH 100 UNIT/ML SOPN FlexTouch Pen, Inject 10 Units into the  skin daily., Disp: , Rfl:   Allergies:  Allergies  Allergen Reactions  . Ace Inhibitors Other (See Comments) and Anaphylaxis    Angioedema  . Aspirin Other (See Comments)    Cannot take due to gastric bypass surgery  . Nsaids Anaphylaxis and Other (See Comments)    S/p gastric bypass Cannot take orally due to gastric bypass S/p gastric bypass  . Sular [Nisoldipine Er] Other (See Comments)    angioedema  . Boniva [Ibandronic Acid] Other (See Comments)    Joint pain    Past Medical History, Surgical history, Social history, and Family History were reviewed and updated.  Review of Systems: Review of Systems  Constitutional: Negative.   HENT: Negative.   Eyes: Negative.   Respiratory: Negative.   Cardiovascular: Negative.    Gastrointestinal: Negative.   Genitourinary: Negative.   Musculoskeletal: Negative.   Skin: Negative.   Neurological: Negative.   Endo/Heme/Allergies: Negative.   Psychiatric/Behavioral: Negative.     Physical Exam:  weight is 146 lb (66.2 kg). Her temporal temperature is 96.9 F (36.1 C) (abnormal). Her blood pressure is 161/67 (abnormal) and her pulse is 67. Her respiration is 18 and oxygen saturation is 100%.   Physical Exam Vitals reviewed.  HENT:     Head: Normocephalic and atraumatic.  Eyes:     Pupils: Pupils are equal, round, and reactive to light.  Cardiovascular:     Rate and Rhythm: Normal rate and regular rhythm.     Heart sounds: Normal heart sounds.  Pulmonary:     Effort: Pulmonary effort is normal.     Breath sounds: Normal breath sounds.  Abdominal:     General: Bowel sounds are normal.     Palpations: Abdomen is soft.  Musculoskeletal:        General: No tenderness or deformity. Normal range of motion.     Cervical back: Normal range of motion.  Lymphadenopathy:     Cervical: No cervical adenopathy.  Skin:    General: Skin is warm and dry.     Findings: No erythema or rash.  Neurological:     Mental Status: She is alert and oriented to person, place, and time.  Psychiatric:        Behavior: Behavior normal.        Thought Content: Thought content normal.        Judgment: Judgment normal.      Lab Results  Component Value Date   WBC 10.6 (H) 12/23/2019   HGB 11.3 (L) 12/23/2019   HCT 36.2 12/23/2019   MCV 94.0 12/23/2019   PLT 222 12/23/2019     Chemistry      Component Value Date/Time   NA 140 11/07/2019 0824   NA 145 05/10/2017 0748   NA 137 12/06/2016 0746   K 5.5 (H) 11/07/2019 0824   K 4.7 05/10/2017 0748   K 5.2 (H) 12/06/2016 0746   CL 106 11/07/2019 0824   CL 110 (H) 05/10/2017 0748   CO2 26 11/07/2019 0824   CO2 23 05/10/2017 0748   CO2 23 12/06/2016 0746   BUN 37 (H) 11/07/2019 0824   BUN 28 (H) 05/10/2017 0748   BUN  26.1 (H) 12/06/2016 0746   CREATININE 1.39 (H) 11/07/2019 0824   CREATININE 1.2 05/10/2017 0748   CREATININE 1.2 (H) 12/06/2016 0746      Component Value Date/Time   CALCIUM 9.7 11/07/2019 0824   CALCIUM 9.1 05/10/2017 0748   CALCIUM 9.1 12/06/2016 0746   ALKPHOS 43  11/07/2019 0824   ALKPHOS 61 05/10/2017 0748   ALKPHOS 41 12/06/2016 0746   AST 27 11/07/2019 0824   AST 23 12/06/2016 0746   ALT 23 11/07/2019 0824   ALT 31 05/10/2017 0748   ALT 26 12/06/2016 0746   BILITOT 0.2 (L) 11/07/2019 0824   BILITOT 0.26 12/06/2016 0746       Impression and Plan: Ms. Wheat is 64 year old white female. She has diabetes. She has iron deficiency anemia. She has a very low erythropoietin level.  Thankfully, she will not need Aranesp today.  Her red cell count has been trending upward which is nice to see.  We will still get her back in 6 weeks.    Volanda Napoleon, MD 6/29/20218:16 AM

## 2019-12-29 ENCOUNTER — Encounter: Payer: Self-pay | Admitting: Orthopedic Surgery

## 2019-12-29 NOTE — Progress Notes (Signed)
Office Visit Note   Patient: Kelly Soto           Date of Birth: 07-Jan-1956           MRN: [id number] Visit Date: 12/22/2019 Requested by: [name], MD [address] [place],  [place] [address] PCP: [name], MD  Subjective: Chief Complaint  Patient presents with  . Right Knee - Pain    HPI: Kelly Soto is a 64 y.o. female who presents to the office complaining of Right knee pain.  She states that last injection gave her 2-3 months of relief.  Pain has returned in last 2 weeks.  She has difficulty with kneeling.  Pain is waking her up at night.  Denies groin pain or radicular pain.  She has [phone] ft walking endurance.  Takes tylenol without significant relief.  Cannot take NSAIDs due to history of gastric bypass..                ROS:  All systems reviewed are negative as they relate to the chief complaint within the history of present illness.  Patient denies fevers or chills.  Assessment & Plan: Visit Diagnoses: No diagnosis found.  Plan: Patient is a 64 year old female presents complaining of right knee pain.  She has a history of right knee osteoarthritis.  She had great relief with previous cortisone injection to the right knee.  She had no significant relief with gel injection in the past.  Pain has returned in the last 2 weeks.  She requests a cortisone injection again.  She tolerated the procedure well.  Follow-up as needed.  She can have 3-4 of these injections per year.  Follow-Up Instructions: No follow-ups on file.   Orders:  No orders of the defined types were placed in this encounter.  No orders of the defined types were placed in this encounter.     Procedures: Large Joint Inj: R knee on 12/30/2019 3:41 PM Indications: diagnostic evaluation, joint swelling and pain Details: 18 G 1.5 in needle, superolateral approach  Arthrogram: No  Medications: 5 mL lidocaine 1 %; 40 mg methylPREDNISolone acetate 40 MG/ML; 4 mL  bupivacaine 0.25 % Outcome: tolerated well, no immediate complications Procedure, treatment alternatives, risks and benefits explained, specific risks discussed. Consent was given by the patient. Immediately prior to procedure a time out was called to verify the correct patient, procedure, equipment, support staff and site/side marked as required. Patient was prepped and draped in the usual sterile fashion.       Clinical Data: No additional findings.  Objective: Vital Signs: There were no vitals taken for this visit.  Physical Exam:  Constitutional: Patient appears well-developed HEENT:  Head: Normocephalic Eyes:EOM are normal Neck: Normal range of motion Cardiovascular: Normal rate Pulmonary/chest: Effort normal Neurologic: Patient is alert Skin: Skin is warm Psychiatric: Patient has normal mood and affect  Ortho Exam:  Right knee Exam No effusion Extensor mechanism intact No TTP over the medial or lateral jointlines, quad tendon, patellar tendon, pes anserinus, patella, tibial tubercle, LCL/MCL insertions Stable to varus/valgus stresses.  Stable to anterior/posterior drawer Extension to 0 degrees Flexion > 90 degrees  Specialty Comments:  No specialty comments available.  Imaging: No results found.   PMFS History: Patient Active Problem List   Diagnosis Date Noted  . Chronic renal insufficiency 09/24/2013  . Anemia of renal disease 09/24/2013  . H/O gastric bypass 08/22/2013  . Intestinal malabsorption 08/22/2013  . Anemia, iron deficiency 08/22/2013  .  Chronic eustachian tube dysfunction 03/28/2011  . Diabetes mellitus ([facility]) 03/28/2011   Past Medical History:  Diagnosis Date  . Anemia of renal disease 09/24/2013  . Chronic renal insufficiency 09/24/2013  . Diabetes mellitus without complication ([facility])   . Headache    migrains  . History of hiatal hernia   . Hypertension   . PONV (postoperative nausea and vomiting)   . Ulcer     Family History  Problem  Relation Age of Onset  . Atrial fibrillation Mother   . Heart attack Father   . Sudden Cardiac Death Father   . Stroke Maternal Grandmother     Past Surgical History:  Procedure Laterality Date  . ABDOMINAL HYSTERECTOMY  1992  . BREAST BIOPSY Left 03/26/2017   benign  . CARPAL TUNNEL RELEASE Bilateral   . CHOLECYSTECTOMY N/A 05/11/2016   Procedure: LAPAROSCOPIC CHOLECYSTECTOMY;  Surgeon: Erroll Luna, MD;  Location: [facility];  Service: General;  Laterality: N/A;  . GASTRIC BYPASS  2009   Social History   Occupational History  . Not on file  Tobacco Use  . Smoking status: Never Smoker  . Smokeless tobacco: Never Used  . Tobacco comment: never used tobacco  Vaping Use  . Vaping Use: Never used  Substance and Sexual Activity  . Alcohol use: No    Alcohol/week: 0.0 standard drinks  . Drug use: No  . Sexual activity: Yes

## 2019-12-30 DIAGNOSIS — M1711 Unilateral primary osteoarthritis, right knee: Secondary | ICD-10-CM

## 2019-12-30 MED ORDER — LIDOCAINE HCL 1 % IJ SOLN
5.0000 mL | INTRAMUSCULAR | Status: AC | PRN
  Administered 2019-12-30: 5 mL

## 2019-12-30 MED ORDER — METHYLPREDNISOLONE ACETATE 40 MG/ML IJ SUSP
40.0000 mg | INTRAMUSCULAR | Status: AC | PRN
  Administered 2019-12-30: 40 mg via INTRA_ARTICULAR

## 2019-12-30 MED ORDER — BUPIVACAINE HCL 0.25 % IJ SOLN
4.0000 mL | INTRAMUSCULAR | Status: AC | PRN
  Administered 2019-12-30: 4 mL via INTRA_ARTICULAR

## 2020-01-12 ENCOUNTER — Ambulatory Visit
Admission: RE | Admit: 2020-01-12 | Discharge: 2020-01-12 | Disposition: A | Discharge status: Home / Self Care | Payer: Medicare Other | Source: Ambulatory Visit | Attending: Family Medicine | Admitting: Family Medicine

## 2020-01-12 ENCOUNTER — Other Ambulatory Visit: Payer: Self-pay

## 2020-01-12 DIAGNOSIS — Z1231 Encounter for screening mammogram for malignant neoplasm of breast: Secondary | ICD-10-CM

## 2020-02-04 ENCOUNTER — Encounter: Payer: Self-pay | Admitting: Hematology & Oncology

## 2020-02-04 ENCOUNTER — Other Ambulatory Visit: Payer: Self-pay

## 2020-02-04 ENCOUNTER — Inpatient Hospital Stay: Payer: Medicare Other | Attending: Hematology & Oncology

## 2020-02-04 ENCOUNTER — Inpatient Hospital Stay: Payer: Medicare Other

## 2020-02-04 ENCOUNTER — Telehealth: Payer: Self-pay | Admitting: Hematology & Oncology

## 2020-02-04 ENCOUNTER — Inpatient Hospital Stay (HOSPITAL_BASED_OUTPATIENT_CLINIC_OR_DEPARTMENT_OTHER): Payer: Medicare Other | Admitting: Hematology & Oncology

## 2020-02-04 VITALS — BP 189/74 | HR 65 | Temp 98.5°F | Resp 18 | Ht 61.02 in | Wt 150.4 lb

## 2020-02-04 VITALS — BP 188/78

## 2020-02-04 DIAGNOSIS — D631 Anemia in chronic kidney disease: Secondary | ICD-10-CM

## 2020-02-04 DIAGNOSIS — N1831 Chronic kidney disease, stage 3a: Secondary | ICD-10-CM | POA: Diagnosis not present

## 2020-02-04 DIAGNOSIS — N189 Chronic kidney disease, unspecified: Secondary | ICD-10-CM

## 2020-02-04 LAB — CBC WITH DIFFERENTIAL (CANCER CENTER ONLY)
Abs Immature Granulocytes: 0.15 10*3/uL — ABNORMAL HIGH (ref 0.00–0.07)
Basophils Absolute: 0 10*3/uL (ref 0.0–0.1)
Basophils Relative: 0 %
Eosinophils Absolute: 0.1 10*3/uL (ref 0.0–0.5)
Eosinophils Relative: 1 %
HCT: 33.5 % — ABNORMAL LOW (ref 36.0–46.0)
Hemoglobin: 10.5 g/dL — ABNORMAL LOW (ref 12.0–15.0)
Immature Granulocytes: 2 %
Lymphocytes Relative: 18 %
Lymphs Abs: 1.4 10*3/uL (ref 0.7–4.0)
MCH: 29.6 pg (ref 26.0–34.0)
MCHC: 31.3 g/dL (ref 30.0–36.0)
MCV: 94.4 fL (ref 80.0–100.0)
Monocytes Absolute: 0.7 10*3/uL (ref 0.1–1.0)
Monocytes Relative: 8 %
Neutro Abs: 5.6 10*3/uL (ref 1.7–7.7)
Neutrophils Relative %: 71 %
Platelet Count: 225 10*3/uL (ref 150–400)
RBC: 3.55 MIL/uL — ABNORMAL LOW (ref 3.87–5.11)
RDW: 14.1 % (ref 11.5–15.5)
WBC Count: 8 10*3/uL (ref 4.0–10.5)
nRBC: 0 % (ref 0.0–0.2)

## 2020-02-04 LAB — CMP (CANCER CENTER ONLY)
ALT: 26 U/L (ref 0–44)
AST: 21 U/L (ref 15–41)
Albumin: 4.1 g/dL (ref 3.5–5.0)
Alkaline Phosphatase: 55 U/L (ref 38–126)
Anion gap: 6 (ref 5–15)
BUN: 34 mg/dL — ABNORMAL HIGH (ref 8–23)
CO2: 27 mmol/L (ref 22–32)
Calcium: 10 mg/dL (ref 8.9–10.3)
Chloride: 105 mmol/L (ref 98–111)
Creatinine: 1.1 mg/dL — ABNORMAL HIGH (ref 0.44–1.00)
GFR, Est AFR Am: >60 mL/min (ref >60)
GFR, Estimated: 53 mL/min — ABNORMAL LOW (ref >60)
Glucose, Bld: 135 mg/dL — ABNORMAL HIGH (ref 70–99)
Potassium: 5.6 mmol/L — ABNORMAL HIGH (ref 3.5–5.1)
Sodium: 138 mmol/L (ref 135–145)
Total Bilirubin: 0.3 mg/dL (ref 0.3–1.2)
Total Protein: 6.7 g/dL (ref 6.5–8.1)

## 2020-02-04 LAB — FERRITIN: Ferritin: 623 ng/mL — ABNORMAL HIGH (ref 11–307)

## 2020-02-04 LAB — RETICULOCYTES
Immature Retic Fract: 6.3 % (ref 2.3–15.9)
RBC.: 3.46 MIL/uL — ABNORMAL LOW (ref 3.87–5.11)
Retic Count, Absolute: 51.9 10*3/uL (ref 19.0–186.0)
Retic Ct Pct: 1.5 % (ref 0.4–3.1)

## 2020-02-04 LAB — IRON AND TIBC
Iron: 87 ug/dL (ref 41–142)
Saturation Ratios: 26 % (ref 21–57)
TIBC: 341 ug/dL (ref 236–444)
UIBC: 254 ug/dL (ref 120–384)

## 2020-02-04 MED ORDER — EPOETIN ALFA-EPBX 40000 UNIT/ML IJ SOLN
40000.0000 [IU] | Freq: Once | INTRAMUSCULAR | Status: AC
  Administered 2020-02-04: 40000 [IU] via SUBCUTANEOUS

## 2020-02-04 NOTE — Progress Notes (Signed)
Okay per Dr.Ennever to give Retacrit today with BP of 188/78.

## 2020-02-04 NOTE — Patient Instructions (Signed)

## 2020-02-04 NOTE — Telephone Encounter (Signed)
Appointments scheduled calendar printed per 8/11 los 

## 2020-02-04 NOTE — Progress Notes (Signed)
Hematology and Oncology Follow Up Visit  ROMANITA FAGER [id number] 1955-07-13 64 y.o. 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  Duggan is back for follow-up.  She feels pretty good.  She is doing well this summer.  She enjoys her little dog.  Hopefully, she will be able to take them on vacation when they go on Labor Day.  Her blood sugars are doing much better today.  She is doing her best to control these.  Her last iron studies back in June showed a ferritin of 5 1 with iron saturation of 26%.  She has had no problems with fatigue or weakness.  She has had no bleeding.  There is no change in bowel or bladder habits.  She has had no rashes.  She has had no leg swelling.  Overall, her performance status is ECOG 1.     Medications:  Current Outpatient Medications:  .  acetaminophen (TYLENOL) 500 MG chewable tablet, Chew 500-1,000 mg by mouth every 6 (six) hours as needed for pain (depends on pain level)., Disp: , Rfl:  .  baclofen (LIORESAL) 10 MG tablet, Take 10 mg by mouth daily as needed. migraines, Disp: , Rfl:  .  BD PEN NEEDLE NANO U/F 32G X 4 MM MISC, USE 1 ONCE DAILY WITH TRESIBA, Disp: , Rfl:  .  Blood Glucose Monitoring Suppl (CONTOUR NEXT EZ) w/Device KIT, USE TO CHECK BLOOD SUGARS DAILY, Disp: , Rfl:  .  CONTOUR NEXT TEST test strip, USE STRIP TO CHECK GLUCOSE ONCE DAILY, Disp: , Rfl:  .  denosumab (PROLIA) 60 MG/ML SOLN injection, Inject 60 mg into the skin every 6 (six) months. Administer in upper arm, thigh, or abdomen  Next injection is due 05-2016, Disp: , Rfl:  .  diazepam (VALIUM) 5 MG tablet, Take 5 mg by mouth daily as needed for anxiety. , Disp: , Rfl:  0 .  fluticasone (FLONASE) 50 MCG/ACT nasal spray, Place 2 sprays into both nostrils at bedtime., Disp: , Rfl:  .  glyBURIDE-metformin (GLUCOVANCE) 2.5-500 MG per tablet, Take 2 tablets by mouth 2 (two) times daily., Disp: , Rfl:  .  hydroxypropyl methylcellulose / hypromellose (ISOPTO TEARS / GONIOVISC) 2.5 % ophthalmic solution, Place 1 drop into both eyes 3 (three) times daily., Disp: , Rfl:  .  metoprolol succinate (TOPROL-XL) 25 MG 24 hr tablet, Take 25 mg by mouth every morning., Disp: , Rfl:  .  Microlet Lancets MISC, USE AS DIRECTED TO CHECK BLOOD SUGAR, Disp: , Rfl:  .  Multiple Vitamin (MULTI-VITAMINS) TABS, Take by mouth., Disp: , Rfl:  .  omeprazole (PRILOSEC) 40 MG capsule, Take 40 mg by mouth 2 (two) times daily., Disp: , Rfl:  .  pioglitazone (ACTOS) 15 MG tablet, Take 15 mg by mouth daily., Disp: , Rfl:  .  tobramycin (TOBREX) 0.3 % ophthalmic solution, 1 drop 4 (four) times daily., Disp: , Rfl:  .  TRESIBA FLEXTOUCH 100 UNIT/ML SOPN FlexTouch Pen, Inject 10 Units into the skin daily., Disp: , Rfl:   Allergies:  Allergies  Allergen Reactions  . Ace Inhibitors Other (See Comments) and Anaphylaxis    Angioedema  . Aspirin Other (See Comments)  Cannot take due to gastric bypass surgery  . Nsaids Anaphylaxis and Other (See Comments)    S/p gastric bypass Cannot take orally due to gastric bypass S/p gastric bypass  . Sular [Nisoldipine Er] Other (See Comments)    angioedema  . Boniva [Ibandronic Acid] Other (See Comments)    Joint pain    Past Medical History, Surgical history, Social history, and Family History were reviewed and updated.  Review of Systems: Review of Systems  Constitutional: Negative.   HENT: Negative.   Eyes: Negative.   Respiratory: Negative.   Cardiovascular: Negative.   Gastrointestinal: Negative.   Genitourinary: Negative.   Musculoskeletal: Negative.   Skin: Negative.   Neurological: Negative.   Endo/Heme/Allergies: Negative.    Psychiatric/Behavioral: Negative.     Physical Exam:  height is 5' 1.02" (1.55 m) and weight is 150 lb 6.4 oz (68.2 kg). Her oral temperature is 98.5 F (36.9 C). Her blood pressure is 189/74 (abnormal) and her pulse is 65. Her respiration is 18 and oxygen saturation is 100%.   Physical Exam Vitals reviewed.  HENT:     Head: Normocephalic and atraumatic.  Eyes:     Pupils: Pupils are equal, round, and reactive to light.  Cardiovascular:     Rate and Rhythm: Normal rate and regular rhythm.     Heart sounds: Normal heart sounds.  Pulmonary:     Effort: Pulmonary effort is normal.     Breath sounds: Normal breath sounds.  Abdominal:     General: Bowel sounds are normal.     Palpations: Abdomen is soft.  Musculoskeletal:        General: No tenderness or deformity. Normal range of motion.     Cervical back: Normal range of motion.  Lymphadenopathy:     Cervical: No cervical adenopathy.  Skin:    General: Skin is warm and dry.     Findings: No erythema or rash.  Neurological:     Mental Status: She is alert and oriented to person, place, and time.  Psychiatric:        Behavior: Behavior normal.        Thought Content: Thought content normal.        Judgment: Judgment normal.      Lab Results  Component Value Date   WBC 8.0 02/04/2020   HGB 10.5 (L) 02/04/2020   HCT 33.5 (L) 02/04/2020   MCV 94.4 02/04/2020   PLT 225 02/04/2020     Chemistry      Component Value Date/Time   NA 138 02/04/2020 0801   NA 145 05/10/2017 0748   NA 137 12/06/2016 0746   K 5.6 (H) 02/04/2020 0801   K 4.7 05/10/2017 0748   K 5.2 (H) 12/06/2016 0746   CL 105 02/04/2020 0801   CL 110 (H) 05/10/2017 0748   CO2 27 02/04/2020 0801   CO2 23 05/10/2017 0748   CO2 23 12/06/2016 0746   BUN 34 (H) 02/04/2020 0801   BUN 28 (H) 05/10/2017 0748   BUN 26.1 (H) 12/06/2016 0746   CREATININE 1.10 (H) 02/04/2020 0801   CREATININE 1.2 05/10/2017 0748   CREATININE 1.2 (H) 12/06/2016 0746       Component Value Date/Time   CALCIUM 10.0 02/04/2020 0801   CALCIUM 9.1 05/10/2017 0748   CALCIUM 9.1 12/06/2016 0746   ALKPHOS 55 02/04/2020 0801   ALKPHOS 61 05/10/2017 0748   ALKPHOS 41 12/06/2016 0746   AST 21 02/04/2020 0801   AST 23 12/06/2016 0746  ALT 26 02/04/2020 0801   ALT 31 05/10/2017 0748   ALT 26 12/06/2016 0746   BILITOT 0.3 02/04/2020 0801   BILITOT 0.26 12/06/2016 0746       Impression and Plan: Ms. Scharnhorst is 64 year old white female. She has diabetes. She has iron deficiency anemia. She has a very low erythropoietin level.  We will go ahead and give her Aranesp today.  This was helps.  I want make sure we get her blood count up so when she goes on vacation she will feel energetic.  I noted that her potassium level is on the high side.  This is always been on the higher side.  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  As always, we will get her back in 6 weeks.     Volanda Napoleon, MD 8/11/20218:37 AM

## 2020-03-16 ENCOUNTER — Inpatient Hospital Stay: Payer: Medicare Other

## 2020-03-16 ENCOUNTER — Inpatient Hospital Stay: Payer: Medicare Other | Attending: Hematology & Oncology

## 2020-03-16 ENCOUNTER — Telehealth: Payer: Self-pay | Admitting: Hematology & Oncology

## 2020-03-16 ENCOUNTER — Encounter: Payer: Self-pay | Admitting: *Deleted

## 2020-03-16 ENCOUNTER — Inpatient Hospital Stay (HOSPITAL_BASED_OUTPATIENT_CLINIC_OR_DEPARTMENT_OTHER): Payer: Medicare Other | Admitting: Hematology & Oncology

## 2020-03-16 ENCOUNTER — Encounter: Payer: Self-pay | Admitting: Hematology & Oncology

## 2020-03-16 ENCOUNTER — Other Ambulatory Visit: Payer: Self-pay

## 2020-03-16 VITALS — BP 169/65 | HR 69 | Temp 98.5°F | Resp 16 | Wt 155.0 lb

## 2020-03-16 DIAGNOSIS — D631 Anemia in chronic kidney disease: Secondary | ICD-10-CM

## 2020-03-16 DIAGNOSIS — N1831 Chronic kidney disease, stage 3a: Secondary | ICD-10-CM

## 2020-03-16 DIAGNOSIS — N189 Chronic kidney disease, unspecified: Secondary | ICD-10-CM

## 2020-03-16 DIAGNOSIS — K9041 Non-celiac gluten sensitivity: Secondary | ICD-10-CM | POA: Diagnosis not present

## 2020-03-16 LAB — CMP (CANCER CENTER ONLY)
ALT: 28 U/L (ref 0–44)
AST: 21 U/L (ref 15–41)
Albumin: 3.9 g/dL (ref 3.5–5.0)
Alkaline Phosphatase: 51 U/L (ref 38–126)
Anion gap: 6 (ref 5–15)
BUN: 38 mg/dL — ABNORMAL HIGH (ref 8–23)
CO2: 27 mmol/L (ref 22–32)
Calcium: 9.2 mg/dL (ref 8.9–10.3)
Chloride: 106 mmol/L (ref 98–111)
Creatinine: 1.12 mg/dL — ABNORMAL HIGH (ref 0.44–1.00)
GFR, Est AFR Am: 60 mL/min — ABNORMAL LOW (ref >60)
GFR, Estimated: 52 mL/min — ABNORMAL LOW (ref >60)
Glucose, Bld: 187 mg/dL — ABNORMAL HIGH (ref 70–99)
Potassium: 5.4 mmol/L — ABNORMAL HIGH (ref 3.5–5.1)
Sodium: 139 mmol/L (ref 135–145)
Total Bilirubin: 0.2 mg/dL — ABNORMAL LOW (ref 0.3–1.2)
Total Protein: 6.8 g/dL (ref 6.5–8.1)

## 2020-03-16 LAB — CBC WITH DIFFERENTIAL (CANCER CENTER ONLY)
Abs Immature Granulocytes: 0.1 10*3/uL — ABNORMAL HIGH (ref 0.00–0.07)
Basophils Absolute: 0.1 10*3/uL (ref 0.0–0.1)
Basophils Relative: 1 %
Eosinophils Absolute: 0.2 10*3/uL (ref 0.0–0.5)
Eosinophils Relative: 2 %
HCT: 33.7 % — ABNORMAL LOW (ref 36.0–46.0)
Hemoglobin: 10.5 g/dL — ABNORMAL LOW (ref 12.0–15.0)
Immature Granulocytes: 1 %
Lymphocytes Relative: 25 %
Lymphs Abs: 1.8 10*3/uL (ref 0.7–4.0)
MCH: 29.6 pg (ref 26.0–34.0)
MCHC: 31.2 g/dL (ref 30.0–36.0)
MCV: 94.9 fL (ref 80.0–100.0)
Monocytes Absolute: 0.6 10*3/uL (ref 0.1–1.0)
Monocytes Relative: 8 %
Neutro Abs: 4.6 10*3/uL (ref 1.7–7.7)
Neutrophils Relative %: 63 %
Platelet Count: 209 10*3/uL (ref 150–400)
RBC: 3.55 MIL/uL — ABNORMAL LOW (ref 3.87–5.11)
RDW: 14.4 % (ref 11.5–15.5)
WBC Count: 7.4 10*3/uL (ref 4.0–10.5)
nRBC: 0 % (ref 0.0–0.2)

## 2020-03-16 LAB — RETICULOCYTES
Immature Retic Fract: 6.8 % (ref 2.3–15.9)
RBC.: 3.54 MIL/uL — ABNORMAL LOW (ref 3.87–5.11)
Retic Count, Absolute: 43.9 10*3/uL (ref 19.0–186.0)
Retic Ct Pct: 1.2 % (ref 0.4–3.1)

## 2020-03-16 LAB — FERRITIN: Ferritin: 523 ng/mL — ABNORMAL HIGH (ref 11–307)

## 2020-03-16 LAB — IRON AND TIBC
Iron: 92 ug/dL (ref 41–142)
Saturation Ratios: 30 % (ref 21–57)
TIBC: 306 ug/dL (ref 236–444)
UIBC: 214 ug/dL (ref 120–384)

## 2020-03-16 MED ORDER — DARBEPOETIN ALFA 300 MCG/0.6ML IJ SOSY
300.0000 ug | PREFILLED_SYRINGE | Freq: Once | INTRAMUSCULAR | Status: AC
  Administered 2020-03-16: 300 ug via SUBCUTANEOUS

## 2020-03-16 MED ORDER — DARBEPOETIN ALFA 300 MCG/0.6ML IJ SOSY
PREFILLED_SYRINGE | INTRAMUSCULAR | Status: AC
  Filled 2020-03-16: qty 0.6

## 2020-03-16 MED ORDER — EPOETIN ALFA-EPBX 40000 UNIT/ML IJ SOLN: 40000.0000 [IU] | Freq: Once | INTRAMUSCULAR | Status: DC

## 2020-03-16 NOTE — Telephone Encounter (Signed)
Appointments scheduled calendar printed per 9/21 los

## 2020-03-16 NOTE — Progress Notes (Signed)
Hematology and Oncology Follow Up Visit  Kelly Soto [id number] [birth year] 64 y.o. 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  Kelly Soto is back for follow-up.  She feels pretty good.  She had a wonderful Labor Day weekend.  She and her husband did not go anywhere.  She is feeling well.  She has had better control of her blood sugars.  Her blood sugar this morning was 187.  She said that at home the blood sugar was 111.  She still is enjoying her dog.  She walks him every day.  She has had no bleeding.  We will have to see what her iron studies show.  She has had no change in bowel or bladder habits.  Her potassium is always on the higher side.  This is chronic for her.  Overall, her performance status is ECOG 1.     Medications:  Current Outpatient Medications:  .  acetaminophen (TYLENOL) 500 MG chewable tablet, Chew 500-1,000 mg by mouth every 6 (six) hours as needed for pain (depends on pain level)., Disp: , Rfl:  .  baclofen (LIORESAL) 10 MG tablet, Take 10 mg by mouth daily as needed. migraines, Disp: , Rfl:  .  BD PEN NEEDLE NANO U/F 32G X 4 MM MISC, USE 1 ONCE DAILY WITH TRESIBA, Disp: , Rfl:  .  Blood Glucose Monitoring Suppl (CONTOUR NEXT EZ) w/Device KIT, USE TO CHECK BLOOD SUGARS DAILY, Disp: , Rfl:  .  CONTOUR NEXT TEST test strip, USE STRIP TO CHECK GLUCOSE ONCE DAILY, Disp: , Rfl:  .  denosumab (PROLIA) 60 MG/ML SOLN injection, Inject 60 mg into the skin every 6 (six) months. Administer in upper arm, thigh, or abdomen  Next injection is due 05-2016, Disp: , Rfl:  .  diazepam (VALIUM) 5 MG tablet, Take 5 mg by mouth daily as needed for anxiety. , Disp: , Rfl:  0 .  fluticasone (FLONASE) 50 MCG/ACT nasal spray, Place 2 sprays into both nostrils at bedtime., Disp: , Rfl:  .  glyBURIDE-metformin (GLUCOVANCE) 2.5-500 MG per tablet, Take 2 tablets by mouth 2 (two) times daily., Disp: , Rfl:  .  hydroxypropyl methylcellulose / hypromellose (ISOPTO TEARS / GONIOVISC) 2.5 % ophthalmic solution, Place 1 drop into both eyes 3 (three) times daily., Disp: , Rfl:  .  metoprolol succinate (TOPROL-XL) 25 MG 24 hr tablet, Take 25 mg by mouth every morning., Disp: , Rfl:  .  Microlet Lancets MISC, USE AS DIRECTED TO CHECK BLOOD SUGAR, Disp: , Rfl:  .  Multiple Vitamin (MULTI-VITAMINS) TABS, Take by mouth., Disp: , Rfl:  .  omeprazole (PRILOSEC) 40 MG capsule, Take 40 mg by mouth 2 (two) times daily., Disp: , Rfl:  .  pioglitazone (ACTOS) 15 MG tablet, Take 15 mg by mouth daily., Disp: , Rfl:  .  tobramycin (TOBREX) 0.3 % ophthalmic solution, 1 drop 4 (four) times daily., Disp: , Rfl:  .  TRESIBA FLEXTOUCH 100 UNIT/ML SOPN FlexTouch Pen, Inject 10 Units into the skin daily., Disp: , Rfl:   Allergies:  Allergies  Allergen Reactions  . Ace Inhibitors Other (See Comments) and Anaphylaxis    Angioedema  . Aspirin Other (See  Comments)    Cannot take due to gastric bypass surgery  . Nsaids Anaphylaxis and Other (See Comments)    S/p gastric bypass Cannot take orally due to gastric bypass S/p gastric bypass  . Sular [Nisoldipine Er] Other (See Comments)    angioedema  . Boniva [Ibandronic Acid] Other (See Comments)    Joint pain    Past Medical History, Surgical history, Social history, and Family History were reviewed and updated.  Review of Systems: Review of Systems  Constitutional: Negative.   HENT: Negative.   Eyes: Negative.   Respiratory: Negative.   Cardiovascular: Negative.   Gastrointestinal: Negative.   Genitourinary: Negative.   Musculoskeletal: Negative.   Skin: Negative.   Neurological: Negative.   Endo/Heme/Allergies: Negative.    Psychiatric/Behavioral: Negative.     Physical Exam:  vitals were not taken for this visit.   Physical Exam Vitals reviewed.  HENT:     Head: Normocephalic and atraumatic.  Eyes:     Pupils: Pupils are equal, round, and reactive to light.  Cardiovascular:     Rate and Rhythm: Normal rate and regular rhythm.     Heart sounds: Normal heart sounds.  Pulmonary:     Effort: Pulmonary effort is normal.     Breath sounds: Normal breath sounds.  Abdominal:     General: Bowel sounds are normal.     Palpations: Abdomen is soft.  Musculoskeletal:        General: No tenderness or deformity. Normal range of motion.     Cervical back: Normal range of motion.  Lymphadenopathy:     Cervical: No cervical adenopathy.  Skin:    General: Skin is warm and dry.     Findings: No erythema or rash.  Neurological:     Mental Status: She is alert and oriented to person, place, and time.  Psychiatric:        Behavior: Behavior normal.        Thought Content: Thought content normal.        Judgment: Judgment normal.      Lab Results  Component Value Date   WBC 7.4 03/16/2020   HGB 10.5 (L) 03/16/2020   HCT 33.7 (L) 03/16/2020   MCV 94.9 03/16/2020   PLT 209 03/16/2020     Chemistry      Component Value Date/Time   NA 138 02/04/2020 0801   NA 145 05/10/2017 0748   NA 137 12/06/2016 0746   K 5.6 (H) 02/04/2020 0801   K 4.7 05/10/2017 0748   K 5.2 (H) 12/06/2016 0746   CL 105 02/04/2020 0801   CL 110 (H) 05/10/2017 0748   CO2 27 02/04/2020 0801   CO2 23 05/10/2017 0748   CO2 23 12/06/2016 0746   BUN 34 (H) 02/04/2020 0801   BUN 28 (H) 05/10/2017 0748   BUN 26.1 (H) 12/06/2016 0746   CREATININE 1.10 (H) 02/04/2020 0801   CREATININE 1.2 05/10/2017 0748   CREATININE 1.2 (H) 12/06/2016 0746      Component Value Date/Time   CALCIUM 10.0 02/04/2020 0801   CALCIUM 9.1 05/10/2017 0748   CALCIUM 9.1 12/06/2016 0746   ALKPHOS 55 02/04/2020 0801   ALKPHOS 61 05/10/2017 0748   ALKPHOS  41 12/06/2016 0746   AST 21 02/04/2020 0801   AST 23 12/06/2016 0746   ALT 26 02/04/2020 0801   ALT 31 05/10/2017 0748   ALT 26 12/06/2016 0746   BILITOT 0.3 02/04/2020 0801   BILITOT 0.26 12/06/2016 0746  Impression and Plan: [name] is 64 year old white female. She has diabetes. She has iron deficiency anemia. She has a very low erythropoietin level.  We will go ahead and give her Aranesp today.  Unfortunately, because of insurance, we do not have to give her Retacrit which is ridiculous.  She really needs a long-acting ESA.  I hate that the insurance is really more focused on cost then on patient care.  I will try to get her back before Thanksgiving.  Hopefully, we will be able to get her through all the holidays after that.    Volanda Napoleon, MD 9/21/20218:01 AM

## 2020-03-16 NOTE — Patient Instructions (Signed)
Darbepoetin Alfa injection What is this medicine? DARBEPOETIN ALFA (dar be POE e tin AL fa) helps your body make more red blood cells. It is used to treat anemia caused by chronic kidney failure and chemotherapy. This medicine may be used for other purposes; ask your health care provider or pharmacist if you have questions. COMMON BRAND NAME(S): Aranesp What should I tell my health care provider before I take this medicine? They need to know if you have any of these conditions:  blood clotting disorders or history of blood clots  cancer patient not on chemotherapy  cystic fibrosis  heart disease, such as angina, heart failure, or a history of a heart attack  hemoglobin level of 12 g/dL or greater  high blood pressure  low levels of folate, iron, or vitamin B12  seizures  an unusual or allergic reaction to darbepoetin, erythropoietin, albumin, hamster proteins, latex, other medicines, foods, dyes, or preservatives  pregnant or trying to get pregnant  breast-feeding How should I use this medicine? This medicine is for injection into a vein or under the skin. It is usually given by a health care professional in a hospital or clinic setting. If you get this medicine at home, you will be taught how to prepare and give this medicine. Use exactly as directed. Take your medicine at regular intervals. Do not take your medicine more often than directed. It is important that you put your used needles and syringes in a special sharps container. Do not put them in a trash can. If you do not have a sharps container, call your pharmacist or healthcare provider to get one. A special MedGuide will be given to you by the pharmacist with each prescription and refill. Be sure to read this information carefully each time. Talk to your pediatrician regarding the use of this medicine in children. While this medicine may be used in children as young as 1 month of age for selected conditions, precautions do  apply. Overdosage: If you think you have taken too much of this medicine contact a poison control center or emergency room at once. NOTE: This medicine is only for you. Do not share this medicine with others. What if I miss a dose? If you miss a dose, take it as soon as you can. If it is almost time for your next dose, take only that dose. Do not take double or extra doses. What may interact with this medicine? Do not take this medicine with any of the following medications:  epoetin alfa This list may not describe all possible interactions. Give your health care provider a list of all the medicines, herbs, non-prescription drugs, or dietary supplements you use. Also tell them if you smoke, drink alcohol, or use illegal drugs. Some items may interact with your medicine. What should I watch for while using this medicine? Your condition will be monitored carefully while you are receiving this medicine. You may need blood work done while you are taking this medicine. This medicine may cause a decrease in vitamin B6. You should make sure that you get enough vitamin B6 while you are taking this medicine. Discuss the foods you eat and the vitamins you take with your health care professional. What side effects may I notice from receiving this medicine? Side effects that you should report to your doctor or health care professional as soon as possible:  allergic reactions like skin rash, itching or hives, swelling of the face, lips, or tongue  breathing problems  changes in   vision  chest pain  confusion, trouble speaking or understanding  feeling faint or lightheaded, falls  high blood pressure  muscle aches or pains  pain, swelling, warmth in the leg  rapid weight gain  severe headaches  sudden numbness or weakness of the face, arm or leg  trouble walking, dizziness, loss of balance or coordination  seizures (convulsions)  swelling of the ankles, feet, hands  unusually weak or  tired Side effects that usually do not require medical attention (report to your doctor or health care professional if they continue or are bothersome):  diarrhea  fever, chills (flu-like symptoms)  headaches  nausea, vomiting  redness, stinging, or swelling at site where injected This list may not describe all possible side effects. Call your doctor for medical advice about side effects. You may report side effects to FDA at 1-800-FDA-1088. Where should I keep my medicine? Keep out of the reach of children. Store in a refrigerator between 2 and 8 degrees C (36 and 46 degrees F). Do not freeze. Do not shake. Throw away any unused portion if using a single-dose vial. Throw away any unused medicine after the expiration date. NOTE: This sheet is a summary. It may not cover all possible information. If you have questions about this medicine, talk to your doctor, pharmacist, or health care provider.  2020 Elsevier/Gold Standard (2017-06-27 16:44:20)  

## 2020-03-17 ENCOUNTER — Other Ambulatory Visit: Payer: BLUE CROSS/BLUE SHIELD

## 2020-03-17 ENCOUNTER — Ambulatory Visit: Payer: BLUE CROSS/BLUE SHIELD

## 2020-03-17 ENCOUNTER — Ambulatory Visit: Payer: BLUE CROSS/BLUE SHIELD | Admitting: Hematology & Oncology

## 2020-04-16 ENCOUNTER — Ambulatory Visit (INDEPENDENT_AMBULATORY_CARE_PROVIDER_SITE_OTHER): Payer: Medicare Other | Admitting: Orthopedic Surgery

## 2020-04-16 ENCOUNTER — Other Ambulatory Visit: Payer: Self-pay

## 2020-04-16 DIAGNOSIS — M1711 Unilateral primary osteoarthritis, right knee: Secondary | ICD-10-CM | POA: Diagnosis not present

## 2020-04-17 ENCOUNTER — Encounter: Payer: Self-pay | Admitting: Orthopedic Surgery

## 2020-04-17 NOTE — Progress Notes (Signed)
Office Visit Note   Patient: Kelly Soto           Date of Birth: 09/23/1955           MRN: [id number] Visit Date: 04/16/2020 Requested by: [name], MD [address] [place],  [place] [address] PCP: [name], MD  Subjective: Chief Complaint  Patient presents with  . Right Knee - Pain    HPI: Kelly Soto is a 64 year old patient with right knee pain.  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  The patellofemoral and lateral compartments are relatively well spared.  Old arthroscopy photos are reviewed.  She does take Prolia every 6 months.  She has a history of gastric bypass.  She also has diabetes but her last A1c was 7.1.  Recently did see a dentist and her teeth are okay.  Cortisone shots help about 6 weeks and then her pain recurs.  She states currently she "cannot do anything".  She has been gaining a little bit of weight because of her diminished activity.  Hurts for her to bend and pick up things and it takes her 2 days to vacuum her house.  She is had a higher housecleaner.  Occasionally the pain will wake her from sleep at night.  She cannot walk her dog body.  Stairs are extremely difficult particular coming down.  She is able to stand and can walk for about two city blocks.              ROS: All systems reviewed are negative as they relate to the chief complaint within the history of present illness.  Patient denies  fevers or chills.   Assessment & Plan: Visit Diagnoses:  1. Unilateral primary osteoarthritis, right knee     Plan: Impression is right knee unicompartmental arthritis with arthroscopic findings consistent with medial compartment arthritis and clinical findings localizing about 90% to that medial joint line.  Discussed with Kelly Soto operative and nonoperative treatment options.  In general this is a problem which will gradually get worse.  Discussed total versus partial knee replacement.   In general based on her pathology I think she would be a candidate for partial knee replacement.  The risk benefits are discussed include not limited to infection nerve vessel damage incomplete healing as well as incomplete pain relief.  I do think that based on localization of her symptoms as well as localizing pathology only to the medial compartment that she be a reasonable candidate for that.  Patient understands risk and benefits and wishes to proceed.  All questions answered.  No personal or family history of DVT or pulmonary embolism.  Follow-Up Instructions: No follow-ups on file.   Orders:  No orders of the defined types were placed in this encounter.  No orders of the defined types were placed in this encounter.     Procedures: No procedures performed   Clinical Data: No additional findings.  Objective: Vital Signs: There were no vitals taken for this visit.  Physical Exam:   Constitutional: Patient appears well-developed HEENT:  Head: Normocephalic Eyes:EOM are normal Neck: Normal range of motion Cardiovascular: Normal rate Pulmonary/chest: Effort normal Neurologic: Patient is alert Skin: Skin is warm Psychiatric: Patient has normal mood and affect    Ortho Exam: Ortho exam demonstrates well-healed arthroscopy incisions.  Has full extension and flexion to about 125.  Collaterals are stable.  ACL is stable.  PCL is stable.  Extensor mechanism is intact.  No other masses lymphadenopathy or skin changes noted in that right knee region.  No effusion today.  Specialty Comments:  No specialty comments available.  Imaging: No results found.   PMFS History: Patient Active Problem List   Diagnosis Date Noted  . Chronic renal insufficiency 09/24/2013  . Anemia of renal disease 09/24/2013  . H/O gastric bypass 08/22/2013  . Intestinal malabsorption 08/22/2013  . Anemia, iron deficiency 08/22/2013  . Chronic eustachian tube dysfunction 03/28/2011  . Diabetes  mellitus ([facility]) 03/28/2011   Past Medical History:  Diagnosis Date  . Anemia of renal disease 09/24/2013  . Chronic renal insufficiency 09/24/2013  . Diabetes mellitus without complication ([facility])   . Headache    migrains  . History of hiatal hernia   . Hypertension   . PONV (postoperative nausea and vomiting)   . Ulcer     Family History  Problem Relation Age of Onset  . Atrial fibrillation Mother   . Heart attack Father   . Sudden Cardiac Death Father   . Stroke Maternal Grandmother     Past Surgical History:  Procedure Laterality Date  . ABDOMINAL HYSTERECTOMY  1992  . BREAST BIOPSY Left 03/26/2017   benign  . CARPAL TUNNEL RELEASE Bilateral   . CHOLECYSTECTOMY N/A 05/11/2016   Procedure: LAPAROSCOPIC CHOLECYSTECTOMY;  Surgeon: Erroll Luna, MD;  Location: [facility];  Service: General;  Laterality: N/A;  . GASTRIC BYPASS  2009   Social History   Occupational History  . Not on file  Tobacco Use  . Smoking status: Never Smoker  . Smokeless tobacco: Never Used  . Tobacco comment: never used tobacco  Vaping Use  . Vaping Use: Never used  Substance and Sexual Activity  . Alcohol use: No    Alcohol/week: 0.0 standard drinks  . Drug use: No  . Sexual activity: Yes

## 2020-04-21 ENCOUNTER — Other Ambulatory Visit: Payer: Self-pay

## 2020-04-26 NOTE — Pre-Procedure Instructions (Signed)
[id number] - [place], [place] - [address] [place] [address] Phone: [phone] Fax: [facility] [address], [place] - [address] [place] [address] Phone: [phone] Fax: [phone]    Your procedure is scheduled on Tues., Nov. 9, 2021 from 11:15AM-2:36PM  Report to [facility] Entrance "A" at 9:15AM  Call this number if you have problems the morning of surgery:  [phone]   Remember:  Do not eat after midnight on Nov. 8th  You may drink clear liquids until 3 hours (8:15AM) prior to surgery time .  Clear liquids allowed are: Water, Juice (non-citric and without pulp - diabetics please choose diet or no sugar options), Carbonated beverages - (diabetics please choose diet or no sugar options), Clear Tea, Black Coffee only (no creamer, milk or cream including half and half), Plain Jell-O only (diabetics please choose diet or no sugar options), Gatorade (diabetics please choose diet or no sugar options) and Plain Popsicles only   Enhanced Recovery after Surgery for Orthopedics Enhanced Recovery after Surgery is a protocol used to improve the stress on your body and your recovery after surgery.  Patient Instructions  . The day of surgery (if you have diabetes): o  o Drink ONE (1) Gatorade 2 (G2) by __8:15AM___ the morning of surgery o This drink was given to you during your hospital  pre-op appointment visit.  o Color of the Gatorade may vary. Red is not allowed. o Nothing else to drink after completing the  Gatorade 2 (G2).         If you have questions, please contact your surgeon's office.     Take these medicines the morning of surgery with A SIP OF WATER: Metoprolol succinate (TOPROL-XL)      Omeprazole (PRILOSEC)      Tobramycin (TOBREX)       If Needed:      Diazepam (VALIUM)  Hydroxypropyl methylcellulose /  hypromellose (ISOPTO TEARS / GONIOVISC)   As of today, STOP taking all Aspirin (unless instructed by your doctor) and Other Aspirin containing products, Vitamins, Fish oils, and Herbal medications. Also stop all NSAIDS i.e. Advil, Ibuprofen, Motrin, Aleve, Anaprox, Naproxen, BC, Goody Powders, and all Supplements.   WHAT DO I DO ABOUT MY DIABETES MEDICATION?  [name] Do not take the evening dose of GlyBURIDE-Metformin (GLUCOVANCE).  . Do not take GlyBURIDE-Metformin (GLUCOVANCE) and Pioglitazone (ACTOS) the morning of surgery.  . THE MORNING OF SURGERY, take ______6_______ units of _____Tresiba_____insulin.   How to Manage Your Diabetes Before and After Surgery  Why is it important to control my blood sugar before and after surgery? . Improving blood sugar levels before and after surgery helps healing and can limit problems. . A way of improving blood sugar control is eating a healthy diet by: o  Eating less sugar and carbohydrates o  Increasing activity/exercise o  Talking with your doctor about reaching your blood sugar goals . High blood sugars (greater than 180 mg/dL) can raise your risk of infections and slow your recovery, so you will need to focus on controlling your diabetes during the weeks before surgery. . Make sure that the doctor who takes care of your diabetes knows about your planned surgery including the date and location.  How do I manage my blood sugar before surgery? . Check your blood sugar at least 4 times a day, starting 2 days before surgery, to  make sure that the level is not too high or low. o Check your blood sugar the morning of your surgery when you wake up and every 2 hours until you get to the Short Stay unit. . If your blood sugar is less than 70 mg/dL, you will need to treat for low blood sugar: o Do not take insulin. o Treat a low blood sugar (less than 70 mg/dL) with  cup of clear juice (cranberry or apple), 4 glucose tablets, OR glucose gel. Recheck  blood sugar in 15 minutes after treatment (to make sure it is greater than 70 mg/dL). If your blood sugar is not greater than 70 mg/dL on recheck, call [phone] o  for further instructions.                         . If your CBG is greater than 220 mg/dL,call the number above for further instructions.  . If you are admitted to the hospital after surgery: o Your blood sugar will be checked by the staff and you will probably be given insulin after surgery (instead of oral diabetes medicines) to make sure you have good blood sugar levels. o The goal for blood sugar control after surgery is 80-180 mg/dL.  Reviewed and Endorsed by [facility] Patient Education Committee, August 2015    No Smoking of any kind, Tobacco/Vaping, or Alcohol products 24 hours prior to your procedure. If you use a Cpap at night, you may bring all equipment for your overnight stay.   Special instructions:   [facility]- Preparing For Surgery  Before surgery, you can play an important role. Because skin is not sterile, your skin needs to be as free of germs as possible. You can reduce the number of germs on your skin by washing with CHG (chlorahexidine gluconate) Soap before surgery.  CHG is an antiseptic cleaner which kills germs and bonds with the skin to continue killing germs even after washing.    Please do not use if you have an allergy to CHG or antibacterial soaps. If your skin becomes reddened/irritated stop using the CHG.  Do not shave (including legs and underarms) for at least 48 hours prior to first CHG shower. It is OK to shave your face.  Please follow these instructions carefully.   1. Shower the NIGHT BEFORE SURGERY and the MORNING OF SURGERY with CHG.   2. If you chose to wash your hair, wash your hair first as usual with your normal shampoo.  3. After you shampoo, rinse your hair and body thoroughly to remove the shampoo.  4. Use CHG as you would any other liquid soap. You can apply CHG  directly to the skin and wash gently with a scrungie or a clean washcloth.   5. Apply the CHG Soap to your body ONLY FROM THE NECK DOWN.  Do not use on open wounds or open sores. Avoid contact with your eyes, ears, mouth and genitals (private parts). Wash Face and genitals (private parts)  with your normal soap.  6. Wash thoroughly, paying special attention to the area where your surgery will be performed.  7. Thoroughly rinse your body with warm water from the neck down.  8. DO NOT shower/wash with your normal soap after using and rinsing off the CHG Soap.  9. Pat yourself dry with a CLEAN TOWEL.  10. Wear CLEAN PAJAMAS to bed the night before surgery, wear comfortable clothes the morning of surgery  11. Place  CLEAN SHEETS on your bed the night of your first shower and DO NOT SLEEP WITH PETS.   Day of Surgery:            Remember to brush your teeth WITH YOUR REGULAR TOOTHPASTE.  Do not wear jewelry, make-up or nail polish.  Do not wear lotions, powders, or perfumes, or deodorant.  Do not shave 48 hours prior to surgery.    Do not bring valuables to the hospital.  [facility] is not responsible for any belongings or valuables.  Contacts, dentures or bridgework may not be worn into surgery.    For patients admitted to the hospital, discharge time will be determined by your treatment team.  Patients discharged the day of surgery will not be allowed to drive home, and someone age 67 and over needs to stay with them for 24 hours.  Please wear clean clothes to the hospital/surgery center.    Please read over the following fact sheets that you were given.

## 2020-04-27 ENCOUNTER — Other Ambulatory Visit: Payer: Self-pay

## 2020-04-27 ENCOUNTER — Encounter (HOSPITAL_COMMUNITY)
Admission: RE | Admit: 2020-04-27 | Discharge: 2020-04-27 | Disposition: A | Discharge status: Home / Self Care | Payer: Medicare Other | Source: Ambulatory Visit | Attending: Orthopedic Surgery | Admitting: Orthopedic Surgery

## 2020-04-27 ENCOUNTER — Encounter (HOSPITAL_COMMUNITY): Payer: Self-pay

## 2020-04-27 DIAGNOSIS — Z01818 Encounter for other preprocedural examination: Secondary | ICD-10-CM | POA: Diagnosis present

## 2020-04-27 HISTORY — DX: Unspecified osteoarthritis, unspecified site: M19.90

## 2020-04-27 HISTORY — DX: Sleep apnea, unspecified: G47.30

## 2020-04-27 HISTORY — DX: Anxiety disorder, unspecified: F41.9

## 2020-04-27 HISTORY — DX: Gastro-esophageal reflux disease without esophagitis: K21.9

## 2020-04-27 HISTORY — DX: Gastroparesis: K31.84

## 2020-04-27 LAB — CBC
HCT: 41.3 % (ref 36.0–46.0)
Hemoglobin: 12.4 g/dL (ref 12.0–15.0)
MCH: 29.4 pg (ref 26.0–34.0)
MCHC: 30 g/dL (ref 30.0–36.0)
MCV: 97.9 fL (ref 80.0–100.0)
Platelets: 217 10*3/uL (ref 150–400)
RBC: 4.22 MIL/uL (ref 3.87–5.11)
RDW: 14.2 % (ref 11.5–15.5)
WBC: 7.9 10*3/uL (ref 4.0–10.5)
nRBC: 0 % (ref 0.0–0.2)

## 2020-04-27 LAB — SURGICAL PCR SCREEN
MRSA, PCR: NEGATIVE
Staphylococcus aureus: NEGATIVE

## 2020-04-27 LAB — BASIC METABOLIC PANEL
Anion gap: 9 (ref 5–15)
BUN: 32 mg/dL — ABNORMAL HIGH (ref 8–23)
CO2: 27 mmol/L (ref 22–32)
Calcium: 10 mg/dL (ref 8.9–10.3)
Chloride: 102 mmol/L (ref 98–111)
Creatinine, Ser: 1.16 mg/dL — ABNORMAL HIGH (ref 0.44–1.00)
GFR, Estimated: 52 mL/min — ABNORMAL LOW (ref >60)
Glucose, Bld: 190 mg/dL — ABNORMAL HIGH (ref 70–99)
Potassium: 5.6 mmol/L — ABNORMAL HIGH (ref 3.5–5.1)
Sodium: 138 mmol/L (ref 135–145)

## 2020-04-27 LAB — URINALYSIS, ROUTINE W REFLEX MICROSCOPIC
Bacteria, UA: NONE SEEN
Bilirubin Urine: NEGATIVE
Glucose, UA: NEGATIVE mg/dL
Hgb urine dipstick: NEGATIVE
Ketones, ur: NEGATIVE mg/dL
Leukocytes,Ua: NEGATIVE
Nitrite: NEGATIVE
Protein, ur: 100 mg/dL — AB
Specific Gravity, Urine: 1.016 (ref 1.005–1.030)
pH: 5 (ref 5.0–8.0)

## 2020-04-27 LAB — GLUCOSE, CAPILLARY: Glucose-Capillary: 162 mg/dL — ABNORMAL HIGH (ref 70–99)

## 2020-04-27 LAB — HEMOGLOBIN A1C
Hgb A1c MFr Bld: 7.1 % — ABNORMAL HIGH (ref 4.8–5.6)
Mean Plasma Glucose: 157.07 mg/dL

## 2020-04-27 NOTE — Progress Notes (Signed)
PCP - Dr. Raliegh Ip. Brigitte Pulse  Cardiologist - Denies  Chest x-ray - Denies  EKG - 04/27/20  Stress Test - Denies  ECHO - Denies  Cardiac Cath - 20 yrs ago- negative  AICD-na PM-na LOOP-na  Sleep Study - Yes- Positive CPAP - Denies- has not used since gastric bypass surgery  LABS- 04/27/20: CBC, BMP, UA, UC, PCR 04/30/20: COVID  ASA- Denies  ERAS- Yes- Water given x1  HA1C- 04/27/20 Fasting Blood Sugar - 87-162, today 162 Checks Blood Sugar ___4__ times a week  Anesthesia- No  Pt denies having chest pain, sob, or fever at this time. All instructions explained to the pt, with a verbal understanding of the material. Pt agrees to go over the instructions while at home for a better understanding. Pt also instructed to self quarantine after being tested for COVID-19. The opportunity to ask questions was provided.   Coronavirus Screening  Have you experienced the following symptoms:  Cough yes/no: No Fever (>100.65F)  yes/no: No Runny nose yes/no: No Sore throat yes/no: No Difficulty breathing/shortness of breath  yes/no: No  Have you or a family member traveled in the last 14 days and where? yes/no: No   If the patient indicates "YES" to the above questions, their PAT will be rescheduled to limit the exposure to others and, the surgeon will be notified. THE PATIENT WILL NEED TO BE ASYMPTOMATIC FOR 14 DAYS.   If the patient is not experiencing any of these symptoms, the PAT nurse will instruct them to NOT bring anyone with them to their appointment since they may have these symptoms or traveled as well.   Please remind your patients and families that hospital visitation restrictions are in effect and the importance of the restrictions.

## 2020-04-28 ENCOUNTER — Telehealth: Payer: Self-pay

## 2020-04-28 NOTE — Telephone Encounter (Signed)
Patient called in saying machine was supposed to be sent o her house to be shown how it works , at her pre [facility] advised her she will get the machine in the hospital during surgery , machine is supposed to help bend her leg.. patient is wanting to know is the machine still going to be sent to her house or is she going to receive it in hospital

## 2020-04-28 NOTE — Telephone Encounter (Signed)
I have messaged [employer] to ask about this.

## 2020-04-28 NOTE — Telephone Encounter (Signed)
Per Brent--they will reach out to patient today or tomorrow.  Patient aware.

## 2020-04-29 LAB — URINE CULTURE: Culture: 30000 — AB

## 2020-04-30 ENCOUNTER — Other Ambulatory Visit: Payer: Self-pay | Admitting: Surgical

## 2020-04-30 ENCOUNTER — Other Ambulatory Visit (HOSPITAL_COMMUNITY)
Admission: RE | Admit: 2020-04-30 | Discharge: 2020-04-30 | Disposition: A | Discharge status: Home / Self Care | Payer: Medicare Other | Source: Ambulatory Visit | Attending: Orthopedic Surgery | Admitting: Orthopedic Surgery

## 2020-04-30 DIAGNOSIS — Z01812 Encounter for preprocedural laboratory examination: Secondary | ICD-10-CM | POA: Diagnosis present

## 2020-04-30 DIAGNOSIS — Z20822 Contact with and (suspected) exposure to covid-19: Secondary | ICD-10-CM | POA: Insufficient documentation

## 2020-04-30 LAB — SARS CORONAVIRUS 2 (TAT 6-24 HRS): SARS Coronavirus 2: NEGATIVE

## 2020-04-30 MED ORDER — CIPROFLOXACIN HCL 500 MG PO TABS: 500.0000 mg | ORAL_TABLET | Freq: Two times a day (BID) | ORAL | 0 refills | Status: AC

## 2020-05-04 ENCOUNTER — Encounter (HOSPITAL_COMMUNITY)
Admission: RE | Disposition: A | Discharge status: Home Health | Payer: Self-pay | Source: Home / Self Care | Attending: Orthopedic Surgery

## 2020-05-04 ENCOUNTER — Ambulatory Visit (HOSPITAL_COMMUNITY): Payer: Medicare Other | Admitting: Anesthesiology

## 2020-05-04 ENCOUNTER — Inpatient Hospital Stay (HOSPITAL_COMMUNITY)
Admission: RE | Admit: 2020-05-04 | Discharge: 2020-05-07 | DRG: 470 | Disposition: A | Discharge status: Home Health | Payer: Medicare Other | Attending: Orthopedic Surgery | Admitting: Orthopedic Surgery

## 2020-05-04 ENCOUNTER — Encounter (HOSPITAL_COMMUNITY): Payer: Self-pay | Admitting: Orthopedic Surgery

## 2020-05-04 ENCOUNTER — Other Ambulatory Visit: Payer: Self-pay

## 2020-05-04 DIAGNOSIS — H04123 Dry eye syndrome of bilateral lacrimal glands: Secondary | ICD-10-CM | POA: Diagnosis present

## 2020-05-04 DIAGNOSIS — Z79899 Other long term (current) drug therapy: Secondary | ICD-10-CM

## 2020-05-04 DIAGNOSIS — F419 Anxiety disorder, unspecified: Secondary | ICD-10-CM | POA: Diagnosis present

## 2020-05-04 DIAGNOSIS — Z888 Allergy status to other drugs, medicaments and biological substances status: Secondary | ICD-10-CM

## 2020-05-04 DIAGNOSIS — Z8249 Family history of ischemic heart disease and other diseases of the circulatory system: Secondary | ICD-10-CM

## 2020-05-04 DIAGNOSIS — Z823 Family history of stroke: Secondary | ICD-10-CM

## 2020-05-04 DIAGNOSIS — I10 Essential (primary) hypertension: Secondary | ICD-10-CM | POA: Diagnosis present

## 2020-05-04 DIAGNOSIS — E1165 Type 2 diabetes mellitus with hyperglycemia: Secondary | ICD-10-CM | POA: Diagnosis present

## 2020-05-04 DIAGNOSIS — R112 Nausea with vomiting, unspecified: Secondary | ICD-10-CM | POA: Diagnosis not present

## 2020-05-04 DIAGNOSIS — Z886 Allergy status to analgesic agent status: Secondary | ICD-10-CM

## 2020-05-04 DIAGNOSIS — Z9884 Bariatric surgery status: Secondary | ICD-10-CM

## 2020-05-04 DIAGNOSIS — M1711 Unilateral primary osteoarthritis, right knee: Principal | ICD-10-CM

## 2020-05-04 DIAGNOSIS — Z20822 Contact with and (suspected) exposure to covid-19: Secondary | ICD-10-CM | POA: Diagnosis present

## 2020-05-04 DIAGNOSIS — Z96651 Presence of right artificial knee joint: Secondary | ICD-10-CM

## 2020-05-04 DIAGNOSIS — K219 Gastro-esophageal reflux disease without esophagitis: Secondary | ICD-10-CM | POA: Diagnosis present

## 2020-05-04 DIAGNOSIS — S83241A Other tear of medial meniscus, current injury, right knee, initial encounter: Secondary | ICD-10-CM | POA: Diagnosis present

## 2020-05-04 DIAGNOSIS — G4730 Sleep apnea, unspecified: Secondary | ICD-10-CM | POA: Diagnosis present

## 2020-05-04 HISTORY — PX: PARTIAL KNEE ARTHROPLASTY: SHX2174

## 2020-05-04 LAB — GLUCOSE, CAPILLARY
Glucose-Capillary: 120 mg/dL — ABNORMAL HIGH (ref 70–99)
Glucose-Capillary: 75 mg/dL (ref 70–99)
Glucose-Capillary: 114 mg/dL — ABNORMAL HIGH (ref 70–99)
Glucose-Capillary: 213 mg/dL — ABNORMAL HIGH (ref 70–99)

## 2020-05-04 SURGERY — ARTHROPLASTY, KNEE, UNICOMPARTMENTAL
Anesthesia: Spinal | Site: Knee | Laterality: Right

## 2020-05-04 MED ORDER — BUPIVACAINE HCL (PF) 0.25 % IJ SOLN
INTRAMUSCULAR | Status: AC
  Filled 2020-05-04: qty 30

## 2020-05-04 MED ORDER — METHOCARBAMOL 1000 MG/10ML IJ SOLN
500.0000 mg | Freq: Four times a day (QID) | INTRAVENOUS | Status: DC | PRN
  Filled 2020-05-04: qty 5

## 2020-05-04 MED ORDER — BUPIVACAINE HCL (PF) 0.25 % IJ SOLN
INTRAMUSCULAR | Status: DC | PRN
  Administered 2020-05-04: 10 mL
  Administered 2020-05-04: 20 mL

## 2020-05-04 MED ORDER — SODIUM CHLORIDE (PF) 0.9 % IJ SOLN
INTRAMUSCULAR | Status: DC | PRN
  Administered 2020-05-04: 20 mL

## 2020-05-04 MED ORDER — PHENYLEPHRINE HCL-NACL 10-0.9 MG/250ML-% IV SOLN
INTRAVENOUS | Status: AC
  Filled 2020-05-04: qty 250

## 2020-05-04 MED ORDER — LIDOCAINE HCL (CARDIAC) PF 100 MG/5ML IV SOSY
PREFILLED_SYRINGE | INTRAVENOUS | Status: DC | PRN
  Administered 2020-05-04: 40 mg via INTRAVENOUS

## 2020-05-04 MED ORDER — MENTHOL 3 MG MT LOZG: 1.0000 | LOZENGE | OROMUCOSAL | Status: DC | PRN

## 2020-05-04 MED ORDER — METOCLOPRAMIDE HCL 5 MG PO TABS: 5.0000 mg | ORAL_TABLET | Freq: Three times a day (TID) | ORAL | Status: DC | PRN

## 2020-05-04 MED ORDER — PROPOFOL 10 MG/ML IV BOLUS
INTRAVENOUS | Status: AC
  Filled 2020-05-04: qty 20

## 2020-05-04 MED ORDER — CHLORHEXIDINE GLUCONATE 0.12 % MT SOLN
15.0000 mL | Freq: Once | OROMUCOSAL | Status: AC
  Administered 2020-05-04: 15 mL via OROMUCOSAL

## 2020-05-04 MED ORDER — ORAL CARE MOUTH RINSE: 15.0000 mL | Freq: Once | OROMUCOSAL | Status: AC

## 2020-05-04 MED ORDER — PROMETHAZINE HCL 25 MG/ML IJ SOLN: 6.2500 mg | INTRAMUSCULAR | Status: DC | PRN

## 2020-05-04 MED ORDER — METHOCARBAMOL 500 MG PO TABS
500.0000 mg | ORAL_TABLET | Freq: Four times a day (QID) | ORAL | Status: DC | PRN
  Administered 2020-05-04 – 2020-05-07 (×7): 500 mg via ORAL
  Filled 2020-05-04 (×7): qty 1

## 2020-05-04 MED ORDER — ACETAMINOPHEN 500 MG PO TABS
1000.0000 mg | ORAL_TABLET | Freq: Once | ORAL | Status: AC
  Administered 2020-05-04: 1000 mg via ORAL
  Filled 2020-05-04: qty 2

## 2020-05-04 MED ORDER — LACTATED RINGERS IV SOLN: INTRAVENOUS | Status: DC

## 2020-05-04 MED ORDER — POVIDONE-IODINE 7.5 % EX SOLN
Freq: Once | CUTANEOUS | Status: DC
  Filled 2020-05-04: qty 118

## 2020-05-04 MED ORDER — DEXAMETHASONE SODIUM PHOSPHATE 10 MG/ML IJ SOLN
INTRAMUSCULAR | Status: DC | PRN
  Administered 2020-05-04: 5 mg via INTRAVENOUS

## 2020-05-04 MED ORDER — CEFAZOLIN SODIUM-DEXTROSE 2-4 GM/100ML-% IV SOLN
2.0000 g | INTRAVENOUS | Status: AC
  Administered 2020-05-04: 2 g via INTRAVENOUS

## 2020-05-04 MED ORDER — GLYBURIDE-METFORMIN 2.5-500 MG PO TABS: 2.0000 | ORAL_TABLET | Freq: Two times a day (BID) | ORAL | Status: DC

## 2020-05-04 MED ORDER — BUPIVACAINE-EPINEPHRINE 0.5% -1:200000 IJ SOLN
INTRAMUSCULAR | Status: AC
  Filled 2020-05-04: qty 1

## 2020-05-04 MED ORDER — RIVAROXABAN 10 MG PO TABS
10.0000 mg | ORAL_TABLET | Freq: Every day | ORAL | Status: DC
  Administered 2020-05-05 – 2020-05-07 (×3): 10 mg via ORAL
  Filled 2020-05-04 (×3): qty 1

## 2020-05-04 MED ORDER — ACETAMINOPHEN 500 MG PO TABS
1000.0000 mg | ORAL_TABLET | Freq: Four times a day (QID) | ORAL | Status: AC
  Administered 2020-05-04 – 2020-05-05 (×2): 1000 mg via ORAL
  Filled 2020-05-04 (×2): qty 2

## 2020-05-04 MED ORDER — ONDANSETRON HCL 4 MG/2ML IJ SOLN
4.0000 mg | Freq: Four times a day (QID) | INTRAMUSCULAR | Status: DC | PRN
  Administered 2020-05-04 – 2020-05-05 (×3): 4 mg via INTRAVENOUS
  Filled 2020-05-04 (×3): qty 2

## 2020-05-04 MED ORDER — POVIDONE-IODINE 10 % EX SWAB: 2.0000 "application " | Freq: Once | CUTANEOUS | Status: DC

## 2020-05-04 MED ORDER — BUPIVACAINE IN DEXTROSE 0.75-8.25 % IT SOLN
INTRATHECAL | Status: DC | PRN
  Administered 2020-05-04: 1.8 mL via INTRATHECAL

## 2020-05-04 MED ORDER — INSULIN ASPART 100 UNIT/ML ~~LOC~~ SOLN
0.0000 [IU] | Freq: Three times a day (TID) | SUBCUTANEOUS | Status: DC
  Administered 2020-05-05: 5 [IU] via SUBCUTANEOUS
  Administered 2020-05-05 – 2020-05-06 (×4): 3 [IU] via SUBCUTANEOUS
  Administered 2020-05-06 – 2020-05-07 (×2): 5 [IU] via SUBCUTANEOUS

## 2020-05-04 MED ORDER — METOPROLOL SUCCINATE ER 25 MG PO TB24
25.0000 mg | ORAL_TABLET | Freq: Every morning | ORAL | Status: DC
  Administered 2020-05-06 – 2020-05-07 (×2): 25 mg via ORAL
  Filled 2020-05-04 (×4): qty 1

## 2020-05-04 MED ORDER — PIOGLITAZONE HCL 45 MG PO TABS
45.0000 mg | ORAL_TABLET | Freq: Every day | ORAL | Status: DC
  Administered 2020-05-06 – 2020-05-07 (×2): 45 mg via ORAL
  Filled 2020-05-04 (×4): qty 1

## 2020-05-04 MED ORDER — PANTOPRAZOLE SODIUM 40 MG PO TBEC
40.0000 mg | DELAYED_RELEASE_TABLET | Freq: Every day | ORAL | Status: DC
  Administered 2020-05-06 – 2020-05-07 (×2): 40 mg via ORAL
  Filled 2020-05-04 (×4): qty 1

## 2020-05-04 MED ORDER — SODIUM CHLORIDE 0.9 % IR SOLN
Status: DC | PRN
  Administered 2020-05-04: 3000 mL

## 2020-05-04 MED ORDER — MORPHINE SULFATE (PF) 4 MG/ML IV SOLN
INTRAVENOUS | Status: DC | PRN
  Administered 2020-05-04: 8 mg via INTRAMUSCULAR

## 2020-05-04 MED ORDER — METOCLOPRAMIDE HCL 5 MG/ML IJ SOLN: 5.0000 mg | Freq: Three times a day (TID) | INTRAMUSCULAR | Status: DC | PRN

## 2020-05-04 MED ORDER — MIDAZOLAM HCL 5 MG/5ML IJ SOLN
INTRAMUSCULAR | Status: DC | PRN
  Administered 2020-05-04: 2 mg via INTRAVENOUS

## 2020-05-04 MED ORDER — METFORMIN HCL 500 MG PO TABS
1000.0000 mg | ORAL_TABLET | Freq: Two times a day (BID) | ORAL | Status: DC
  Administered 2020-05-04 – 2020-05-06 (×3): 1000 mg via ORAL
  Filled 2020-05-04 (×5): qty 2

## 2020-05-04 MED ORDER — PROPOFOL 10 MG/ML IV BOLUS
INTRAVENOUS | Status: DC | PRN
  Administered 2020-05-04: 50 mg via INTRAVENOUS
  Administered 2020-05-04: 30 mg via INTRAVENOUS

## 2020-05-04 MED ORDER — MIDAZOLAM HCL 2 MG/2ML IJ SOLN
INTRAMUSCULAR | Status: AC
  Administered 2020-05-04: 2 mg via INTRAVENOUS
  Filled 2020-05-04: qty 2

## 2020-05-04 MED ORDER — ROPIVACAINE HCL 7.5 MG/ML IJ SOLN
INTRAMUSCULAR | Status: DC | PRN
  Administered 2020-05-04: 20 mL via PERINEURAL

## 2020-05-04 MED ORDER — ONDANSETRON HCL 4 MG/2ML IJ SOLN
INTRAMUSCULAR | Status: AC
  Filled 2020-05-04: qty 2

## 2020-05-04 MED ORDER — VANCOMYCIN HCL 1000 MG IV SOLR
INTRAVENOUS | Status: DC | PRN
  Administered 2020-05-04: 1000 mg

## 2020-05-04 MED ORDER — INSULIN ASPART 100 UNIT/ML ~~LOC~~ SOLN
3.0000 [IU] | Freq: Three times a day (TID) | SUBCUTANEOUS | Status: DC
  Administered 2020-05-05 – 2020-05-06 (×6): 3 [IU] via SUBCUTANEOUS

## 2020-05-04 MED ORDER — FENTANYL CITRATE (PF) 100 MCG/2ML IJ SOLN
INTRAMUSCULAR | Status: AC
  Administered 2020-05-04: 100 ug via INTRAVENOUS
  Filled 2020-05-04: qty 2

## 2020-05-04 MED ORDER — ONDANSETRON HCL 4 MG/2ML IJ SOLN
INTRAMUSCULAR | Status: DC | PRN
  Administered 2020-05-04: 4 mg via INTRAVENOUS

## 2020-05-04 MED ORDER — PROPOFOL 500 MG/50ML IV EMUL
INTRAVENOUS | Status: DC | PRN
  Administered 2020-05-04: 25 ug/kg/min via INTRAVENOUS

## 2020-05-04 MED ORDER — CLONIDINE HCL (ANALGESIA) 100 MCG/ML EP SOLN
EPIDURAL | Status: DC | PRN
  Administered 2020-05-04: 1 mL

## 2020-05-04 MED ORDER — LACTATED RINGERS IV SOLN: INTRAVENOUS | Status: AC

## 2020-05-04 MED ORDER — 0.9 % SODIUM CHLORIDE (POUR BTL) OPTIME
TOPICAL | Status: DC | PRN
  Administered 2020-05-04 (×2): 1000 mL

## 2020-05-04 MED ORDER — MORPHINE SULFATE (PF) 4 MG/ML IV SOLN
INTRAVENOUS | Status: AC
  Filled 2020-05-04: qty 1

## 2020-05-04 MED ORDER — MIDAZOLAM HCL 2 MG/2ML IJ SOLN: 2.0000 mg | Freq: Once | INTRAMUSCULAR | Status: AC

## 2020-05-04 MED ORDER — VANCOMYCIN HCL 1000 MG IV SOLR
INTRAVENOUS | Status: AC
  Filled 2020-05-04: qty 1000

## 2020-05-04 MED ORDER — POVIDONE-IODINE 10 % EX SWAB
2.0000 "application " | Freq: Once | CUTANEOUS | Status: AC
  Administered 2020-05-04: 2 via TOPICAL

## 2020-05-04 MED ORDER — LIDOCAINE 2% (20 MG/ML) 5 ML SYRINGE
INTRAMUSCULAR | Status: AC
  Filled 2020-05-04: qty 5

## 2020-05-04 MED ORDER — ONDANSETRON HCL 4 MG PO TABS
4.0000 mg | ORAL_TABLET | Freq: Four times a day (QID) | ORAL | Status: DC | PRN
  Administered 2020-05-06: 4 mg via ORAL
  Filled 2020-05-04: qty 1

## 2020-05-04 MED ORDER — GLYBURIDE 5 MG PO TABS
5.0000 mg | ORAL_TABLET | Freq: Two times a day (BID) | ORAL | Status: DC
  Administered 2020-05-07: 5 mg via ORAL
  Filled 2020-05-04 (×6): qty 1

## 2020-05-04 MED ORDER — FENTANYL CITRATE (PF) 100 MCG/2ML IJ SOLN: 100.0000 ug | Freq: Once | INTRAMUSCULAR | Status: AC

## 2020-05-04 MED ORDER — DEXAMETHASONE SODIUM PHOSPHATE 10 MG/ML IJ SOLN
INTRAMUSCULAR | Status: AC
  Filled 2020-05-04: qty 1

## 2020-05-04 MED ORDER — CEFAZOLIN SODIUM-DEXTROSE 2-4 GM/100ML-% IV SOLN
2.0000 g | Freq: Three times a day (TID) | INTRAVENOUS | Status: AC
  Administered 2020-05-04 – 2020-05-05 (×2): 2 g via INTRAVENOUS
  Filled 2020-05-04 (×2): qty 100

## 2020-05-04 MED ORDER — FENTANYL CITRATE (PF) 100 MCG/2ML IJ SOLN: 25.0000 ug | INTRAMUSCULAR | Status: DC | PRN

## 2020-05-04 MED ORDER — DOCUSATE SODIUM 100 MG PO CAPS
100.0000 mg | ORAL_CAPSULE | Freq: Two times a day (BID) | ORAL | Status: DC
  Administered 2020-05-04 – 2020-05-07 (×5): 100 mg via ORAL
  Filled 2020-05-04 (×5): qty 1

## 2020-05-04 MED ORDER — OXYCODONE HCL 5 MG PO TABS
5.0000 mg | ORAL_TABLET | ORAL | Status: DC | PRN
  Administered 2020-05-04 – 2020-05-07 (×10): 10 mg via ORAL
  Filled 2020-05-04 (×11): qty 2

## 2020-05-04 MED ORDER — TRANEXAMIC ACID-NACL 1000-0.7 MG/100ML-% IV SOLN
1000.0000 mg | INTRAVENOUS | Status: AC
  Administered 2020-05-04: 1000 mg via INTRAVENOUS

## 2020-05-04 MED ORDER — CLONIDINE HCL (ANALGESIA) 100 MCG/ML EP SOLN
EPIDURAL | Status: AC
  Filled 2020-05-04: qty 10

## 2020-05-04 MED ORDER — BUPIVACAINE LIPOSOME 1.3 % IJ SUSP
20.0000 mL | INTRAMUSCULAR | Status: AC
  Administered 2020-05-04: 20 mL
  Filled 2020-05-04: qty 20

## 2020-05-04 MED ORDER — HYDRALAZINE HCL 20 MG/ML IJ SOLN
INTRAMUSCULAR | Status: AC
  Filled 2020-05-04: qty 1

## 2020-05-04 MED ORDER — HYDROMORPHONE HCL 1 MG/ML IJ SOLN
0.5000 mg | INTRAMUSCULAR | Status: DC | PRN
  Administered 2020-05-04 – 2020-05-07 (×10): 0.5 mg via INTRAVENOUS
  Filled 2020-05-04 (×10): qty 0.5

## 2020-05-04 MED ORDER — FENTANYL CITRATE (PF) 250 MCG/5ML IJ SOLN
INTRAMUSCULAR | Status: AC
  Filled 2020-05-04: qty 5

## 2020-05-04 MED ORDER — HYDRALAZINE HCL 20 MG/ML IJ SOLN
5.0000 mg | Freq: Once | INTRAMUSCULAR | Status: AC
  Administered 2020-05-04: 5 mg via INTRAVENOUS

## 2020-05-04 MED ORDER — MIDAZOLAM HCL 2 MG/2ML IJ SOLN
INTRAMUSCULAR | Status: AC
  Filled 2020-05-04: qty 2

## 2020-05-04 MED ORDER — PHENOL 1.4 % MT LIQD: 1.0000 | OROMUCOSAL | Status: DC | PRN

## 2020-05-04 SURGICAL SUPPLY — 92 items
ALCOHOL 70% 16 OZ (MISCELLANEOUS) ×2 IMPLANT
BANDAGE ESMARK 6X9 LF (GAUZE/BANDAGES/DRESSINGS) ×1 IMPLANT
BEARING TIB MENISCAL RT XS 4MM (Knees) IMPLANT
BNDG CMPR 9X6 STRL LF SNTH (GAUZE/BANDAGES/DRESSINGS) ×1
BNDG CMPR MED 10X6 ELC LF (GAUZE/BANDAGES/DRESSINGS) ×1
BNDG CMPR MED 15X6 ELC VLCR LF (GAUZE/BANDAGES/DRESSINGS) ×3
BNDG COHESIVE 6X5 TAN STRL LF (GAUZE/BANDAGES/DRESSINGS) ×2 IMPLANT
BNDG ELASTIC 4X5.8 VLCR STR LF (GAUZE/BANDAGES/DRESSINGS) ×2 IMPLANT
BNDG ELASTIC 6X10 VLCR STRL LF (GAUZE/BANDAGES/DRESSINGS) ×2 IMPLANT
BNDG ELASTIC 6X15 VLCR STRL LF (GAUZE/BANDAGES/DRESSINGS) ×6 IMPLANT
BNDG ESMARK 6X9 LF (GAUZE/BANDAGES/DRESSINGS) ×2
BOWL SMART MIX CTS (DISPOSABLE) ×3 IMPLANT
BRNG TIB XS 4XBYNT PNT SMTH (Knees) ×1 IMPLANT
CEMENT BONE R 1X40 (Cement) ×2 IMPLANT
CLSR STERI-STRIP ANTIMIC 1/2X4 (GAUZE/BANDAGES/DRESSINGS) ×2 IMPLANT
COVER SURGICAL LIGHT HANDLE (MISCELLANEOUS) ×2 IMPLANT
COVER WAND RF STERILE (DRAPES) ×2 IMPLANT
CUFF TOURN SGL QUICK 34 (TOURNIQUET CUFF) ×2
CUFF TOURN SGL QUICK 42 (TOURNIQUET CUFF) IMPLANT
CUFF TRNQT CYL 34X4.125X (TOURNIQUET CUFF) ×1 IMPLANT
DRAPE IMP U-DRAPE 54X76 (DRAPES) ×2 IMPLANT
DRAPE INCISE IOBAN 66X45 STRL (DRAPES) IMPLANT
DRAPE ORTHO SPLIT 77X108 STRL (DRAPES) ×6
DRAPE SURG ORHT 6 SPLT 77X108 (DRAPES) ×3 IMPLANT
DRAPE U-SHAPE 47X51 STRL (DRAPES) ×2 IMPLANT
DRSG AQUACEL AG ADV 3.5X10 (GAUZE/BANDAGES/DRESSINGS) ×2 IMPLANT
DRSG AQUACEL AG ADV 3.5X14 (GAUZE/BANDAGES/DRESSINGS) ×1 IMPLANT
DRSG PAD ABDOMINAL 8X10 ST (GAUZE/BANDAGES/DRESSINGS) ×4 IMPLANT
DURAPREP 26ML APPLICATOR (WOUND CARE) ×2 IMPLANT
ELECT REM PT RETURN 9FT ADLT (ELECTROSURGICAL) ×2
ELECTRODE REM PT RTRN 9FT ADLT (ELECTROSURGICAL) ×1 IMPLANT
FACESHIELD WRAPAROUND (MASK) ×2 IMPLANT
FACESHIELD WRAPAROUND OR TEAM (MASK) ×1 IMPLANT
GAUZE SPONGE 4X4 12PLY STRL (GAUZE/BANDAGES/DRESSINGS) ×2 IMPLANT
GAUZE XEROFORM 5X9 LF (GAUZE/BANDAGES/DRESSINGS) ×2 IMPLANT
GLOVE BIOGEL PI IND STRL 8 (GLOVE) ×1 IMPLANT
GLOVE BIOGEL PI INDICATOR 8 (GLOVE) ×1
GLOVE ECLIPSE 8.0 STRL XLNG CF (GLOVE) ×2 IMPLANT
GOWN STRL REUS W/ TWL LRG LVL3 (GOWN DISPOSABLE) ×3 IMPLANT
GOWN STRL REUS W/ TWL XL LVL3 (GOWN DISPOSABLE) ×1 IMPLANT
GOWN STRL REUS W/TWL LRG LVL3 (GOWN DISPOSABLE) ×6
GOWN STRL REUS W/TWL XL LVL3 (GOWN DISPOSABLE) ×2
HANDPIECE INTERPULSE COAX TIP (DISPOSABLE) ×2
HOOD PEEL AWAY FACE SHEILD DIS (HOOD) ×6 IMPLANT
IMMOBILIZER KNEE 20 (SOFTGOODS)
IMMOBILIZER KNEE 20 THIGH 36 (SOFTGOODS) IMPLANT
IMMOBILIZER KNEE 22 UNIV (SOFTGOODS) IMPLANT
IMMOBILIZER KNEE 24 THIGH 36 (MISCELLANEOUS) IMPLANT
IMMOBILIZER KNEE 24 UNIV (MISCELLANEOUS)
KIT BASIN OR (CUSTOM PROCEDURE TRAY) ×2 IMPLANT
KIT TURNOVER KIT B (KITS) ×2 IMPLANT
KNEE PARTIAL CEMENT FEM XS (Miscellaneous) ×1 IMPLANT
MANIFOLD NEPTUNE II (INSTRUMENTS) ×2 IMPLANT
NDL SPNL 18GX3.5 QUINCKE PK (NEEDLE) ×1 IMPLANT
NEEDLE 18GX1X1/2 (RX/OR ONLY) (NEEDLE) ×2 IMPLANT
NEEDLE HYPO 22GX1.5 SAFETY (NEEDLE) ×5 IMPLANT
NEEDLE SPNL 18GX3.5 QUINCKE PK (NEEDLE) ×2 IMPLANT
NS IRRIG 1000ML POUR BTL (IV SOLUTION) ×4 IMPLANT
PACK BLADE SAW RECIP 70 3 PT (BLADE) ×2 IMPLANT
PACK TOTAL JOINT (CUSTOM PROCEDURE TRAY) ×2 IMPLANT
PACK UNIVERSAL I (CUSTOM PROCEDURE TRAY) ×2 IMPLANT
PAD ARMBOARD 7.5X6 YLW CONV (MISCELLANEOUS) ×4 IMPLANT
PAD CAST 4YDX4 CTTN HI CHSV (CAST SUPPLIES) ×1 IMPLANT
PADDING CAST ABS 6INX4YD NS (CAST SUPPLIES) ×1
PADDING CAST ABS COTTON 6X4 NS (CAST SUPPLIES) IMPLANT
PADDING CAST COTTON 4X4 STRL (CAST SUPPLIES) ×2
PADDING CAST COTTON 6X4 STRL (CAST SUPPLIES) ×6 IMPLANT
SET HNDPC FAN SPRY TIP SCT (DISPOSABLE) ×1 IMPLANT
SET INTERPULSE LAVAGE W/TIP (ORTHOPEDIC DISPOSABLE SUPPLIES) ×1 IMPLANT
SOL PREP POV-IOD 4OZ 10% (MISCELLANEOUS) ×2 IMPLANT
SPONGE LAP 18X18 RF (DISPOSABLE) IMPLANT
STAPLER VISISTAT 35W (STAPLE) IMPLANT
SUCTION FRAZIER HANDLE 10FR (MISCELLANEOUS) ×2
SUCTION TUBE FRAZIER 10FR DISP (MISCELLANEOUS) ×1 IMPLANT
SUT MNCRL+ AB 3-0 CT1 36 (SUTURE) IMPLANT
SUT MONOCRYL AB 3-0 CT1 36IN (SUTURE) ×2
SUT VIC AB 0 CT1 27 (SUTURE) ×6
SUT VIC AB 0 CT1 27XBRD ANBCTR (SUTURE) ×3 IMPLANT
SUT VIC AB 1 CT1 27 (SUTURE) ×8
SUT VIC AB 1 CT1 27XBRD ANBCTR (SUTURE) ×5 IMPLANT
SUT VIC AB 1 CT1 36 (SUTURE) ×1 IMPLANT
SUT VIC AB 2-0 CT1 27 (SUTURE) ×4
SUT VIC AB 2-0 CT1 TAPERPNT 27 (SUTURE) ×2 IMPLANT
SUT VIC AB 2-0 CTB1 (SUTURE) ×4 IMPLANT
SYR 30ML LL (SYRINGE) ×2 IMPLANT
SYR TB 1ML LUER SLIP (SYRINGE) ×2 IMPLANT
TOWEL GREEN STERILE (TOWEL DISPOSABLE) ×4 IMPLANT
TOWEL GREEN STERILE FF (TOWEL DISPOSABLE) ×4 IMPLANT
TRAY FOLEY MTR SLVR 16FR STAT (SET/KITS/TRAYS/PACK) IMPLANT
TRAY TIBIA MEDIAL OXFORD SZ A (Joint) ×1 IMPLANT
UNICOMP OXFORD MENISCAL XS 4MM (Knees) ×2 IMPLANT
WATER STERILE IRR 1000ML POUR (IV SOLUTION) ×4 IMPLANT

## 2020-05-04 NOTE — Anesthesia Postprocedure Evaluation (Signed)
Anesthesia Post Note  Patient: Kelly Soto  Procedure(s) Performed: right partial knee replacement (Right Knee)     Patient location during evaluation: PACU Anesthesia Type: Spinal Level of consciousness: awake and alert Pain management: pain level controlled Vital Signs Assessment: post-procedure vital signs reviewed and stable Respiratory status: spontaneous breathing and respiratory function stable Cardiovascular status: blood pressure returned to baseline and stable Postop Assessment: spinal receding Anesthetic complications: no   No complications documented.  Last Vitals:  Vitals:   05/04/20 1555 05/04/20 1609  BP: (!) 132/93 (!) 156/68  Pulse: (!) 50 (!) 51  Resp: 10 12  Temp:    SpO2: 100% 100%    Last Pain:  Vitals:   05/04/20 1555  TempSrc:   PainSc: 4                  Nataleah Scioneaux DANIEL

## 2020-05-04 NOTE — Anesthesia Preprocedure Evaluation (Addendum)
Anesthesia Evaluation  Patient identified by MRN, date of birth, ID band Patient awake    Reviewed: Allergy & Precautions, NPO status , Patient's Chart, lab work & pertinent test results  History of Anesthesia Complications (+) PONV and history of anesthetic complications  Airway Mallampati: II  TM Distance: >3 FB Neck ROM: Full    Dental no notable dental hx. (+) Dental Advisory Given   Pulmonary sleep apnea ,    Pulmonary exam normal        Cardiovascular hypertension, Normal cardiovascular exam     Neuro/Psych PSYCHIATRIC DISORDERS Anxiety negative neurological ROS     GI/Hepatic Neg liver ROS, hiatal hernia, GERD  ,  Endo/Other  diabetes  Renal/GU Renal InsufficiencyRenal disease     Musculoskeletal negative musculoskeletal ROS (+)   Abdominal   Peds  Hematology negative hematology ROS (+)   Anesthesia Other Findings   Reproductive/Obstetrics                            Anesthesia Physical Anesthesia Plan  ASA: II  Anesthesia Plan: Spinal   Post-op Pain Management:  Regional for Post-op pain   Induction:   PONV Risk Score and Plan: 4 or greater and Ondansetron, Dexamethasone, Propofol infusion and Midazolam  Airway Management Planned: Natural Airway  Additional Equipment:   Intra-op Plan:   Post-operative Plan:   Informed Consent: I have reviewed the patients History and Physical, chart, labs and discussed the procedure including the risks, benefits and alternatives for the proposed anesthesia with the patient or authorized representative who has indicated his/her understanding and acceptance.     Dental advisory given  Plan Discussed with: Anesthesiologist and CRNA  Anesthesia Plan Comments:        Anesthesia Quick Evaluation

## 2020-05-04 NOTE — Progress Notes (Signed)
Orthopedic Tech Progress Note Patient Details:  Kelly Soto 1955-12-28 [id number]  CPM Right Knee CPM Right Knee: On Right Knee Flexion (Degrees): 40 Right Knee Extension (Degrees): 10 Additional Comments: added ice  Post Interventions Patient Tolerated: Well Instructions Provided: Care of device Ortho Devices Type of Ortho Device: Bone foam zero knee Ortho Device/Splint Interventions: Ordered, Application   Post Interventions Patient Tolerated: Well Instructions Provided: Care of [place] 05/04/2020, 5:58 PM

## 2020-05-04 NOTE — H&P (Signed)
Kelly Soto is an 64 y.o. female.   Chief Complaint: Right knee pain HPI: Kelly Soto is a 64 year old patient with right knee pain of long duration.  Had arthroscopy earlier this year which demonstrated medial compartment arthritis with meniscal root pathology.  Remainder of the knee joint appeared intact.  ACL was intact.  She is failed conservative measures including injections activity modification and exercise.  She has pain which affects her activities of daily living as well as pain that wakes her from sleep.  Presents now for unicompartmental knee replacement after explanation of risks and benefits.  Past Medical History:  Diagnosis Date  . Anemia of renal disease 09/24/2013  . Anxiety   . Arthritis   . Chronic renal insufficiency 09/24/2013  . Diabetes mellitus without complication ([place])    Type II  . Gastroparesis   . GERD (gastroesophageal reflux disease)   . Headache    migrains  . History of hiatal hernia   . Hypertension   . PONV (postoperative nausea and vomiting)   . Sleep apnea    Has not used the Cpap since having gastric bypass surgery.  [name] Ulcer     Past Surgical History:  Procedure Laterality Date  . ABDOMINAL HYSTERECTOMY  1992  . BREAST BIOPSY Left 03/26/2017   benign  . CARDIAC CATHETERIZATION     51yr ago- negative  . CARPAL TUNNEL RELEASE Bilateral   . CHOLECYSTECTOMY N/A 05/11/2016   Procedure: LAPAROSCOPIC CHOLECYSTECTOMY;  Surgeon: TErroll Luna MD;  [facility]  Service: General;  Laterality: N/A;  . EYE SURGERY     Bilateral cataracts  . GASTRIC BYPASS  2009    Family History  Problem Relation Age of Onset  . Atrial fibrillation Mother   . Heart attack Father   . Sudden Cardiac Death Father   . Stroke Maternal Grandmother    Social History:  reports that she has never smoked. She has never used smokeless tobacco. She reports that she does not drink alcohol and does not use drugs.  Allergies:  Allergies  Allergen Reactions  . Ace  Inhibitors Other (See Comments) and Anaphylaxis    Angioedema  . Aspirin Other (See Comments)    Cannot take due to gastric bypass surgery  . Nsaids Anaphylaxis and Other (See Comments)    S/p gastric bypass Cannot take orally due to gastric bypass S/p gastric bypass  . Sular [Nisoldipine Er] Other (See Comments)    angioedema  . Boniva [Ibandronic Acid] Other (See Comments)    Joint pain    Medications Prior to Admission  Medication Sig Dispense Refill  . Ascorbic Acid (VITAMIN C) 1000 MG tablet Take 1,000 mg by mouth daily.    . betamethasone valerate (VALISONE) 0.1 % cream Apply 1 application topically daily as needed (irritation).    . diazepam (VALIUM) 5 MG tablet Take 5 mg by mouth daily as needed for anxiety.   0  . fluticasone (FLONASE) 50 MCG/ACT nasal spray Place 2 sprays into both nostrils at bedtime.    . Galcanezumab-gnlm (EMGALITY) 120 MG/ML SOAJ Inject 120 mg into the skin every 30 (thirty) days.    .Marland KitchenglyBURIDE-metformin (GLUCOVANCE) 2.5-500 MG per tablet Take 2 tablets by mouth 2 (two) times daily.    . hydroxypropyl methylcellulose / hypromellose (ISOPTO TEARS / GONIOVISC) 2.5 % ophthalmic solution Place 1 drop into both eyes 3 (three) times daily as needed for dry eyes.     . metoprolol succinate (TOPROL-XL) 25 MG 24 hr tablet Take  25 mg by mouth every morning.    . Multiple Vitamin (MULTI-VITAMINS) TABS Take 1 tablet by mouth daily.     [name] omeprazole (PRILOSEC) 40 MG capsule Take 40 mg by mouth 2 (two) times daily.    . pioglitazone (ACTOS) 45 MG tablet Take 45 mg by mouth daily.    . Simethicone (GAS FREE EXTRA STRENGTH PO) Take 2 tablets by mouth at bedtime.    [name] tobramycin (TOBREX) 0.3 % ophthalmic solution Place 1 drop into both eyes See admin instructions. Instill one drop into the affected eye four times a day prior and the day after eye injection.    . TRESIBA FLEXTOUCH 100 UNIT/ML SOPN FlexTouch Pen Inject 12 Units into the skin daily.     [name] acetaminophen  (TYLENOL) 500 MG chewable tablet Chew 500-1,000 mg by mouth every 6 (six) hours as needed for pain (depends on pain level). (Patient not taking: Reported on 04/21/2020)    . baclofen (LIORESAL) 10 MG tablet Take 10 mg by mouth daily as needed. migraines    . BD PEN NEEDLE NANO U/F 32G X 4 MM MISC USE 1 ONCE DAILY WITH TRESIBA    . Blood Glucose Monitoring Suppl (CONTOUR NEXT EZ) w/Device KIT USE TO CHECK BLOOD SUGARS DAILY    . CONTOUR NEXT TEST test strip USE STRIP TO CHECK GLUCOSE ONCE DAILY    . denosumab (PROLIA) 60 MG/ML SOLN injection Inject 60 mg into the skin every 6 (six) months. Administer in upper arm, thigh, or abdomen  Next injection is due 05-2016    . Microlet Lancets MISC USE AS DIRECTED TO CHECK BLOOD SUGAR    . pioglitazone (ACTOS) 15 MG tablet Take 15 mg by mouth daily. (Patient not taking: Reported on 04/21/2020)      No results found for this or any previous visit (from the past 48 hour(s)). No results found.  Review of Systems  Musculoskeletal: Positive for arthralgias.  All other systems reviewed and are negative.   Blood pressure (!) 180/64, pulse (!) 58, temperature 98.2 F (36.8 C), resp. rate 12, height 5' 0.5" (1.537 m), weight 71.5 kg, SpO2 100 %. Physical Exam Vitals reviewed.  HENT:     Head: Normocephalic.     Nose: Nose normal.     Mouth/Throat:     Mouth: Mucous membranes are moist.  Eyes:     Pupils: Pupils are equal, round, and reactive to light.  Cardiovascular:     Rate and Rhythm: Normal rate.     Pulses: Normal pulses.  Pulmonary:     Effort: Pulmonary effort is normal.  Abdominal:     General: Abdomen is flat.  Musculoskeletal:     Cervical back: Normal range of motion.  Skin:    General: Skin is warm.     Capillary Refill: Capillary refill takes less than 2 seconds.  Neurological:     General: No focal deficit present.     Mental Status: She is alert.  Psychiatric:        Mood and Affect: Mood normal.   Examination of the right  knee demonstrates full extension full flexion good ACL stability.  Collaterals are stable.  Extensor mechanism is intact.  Medial joint line tenderness is present.  Ankle dorsiflexion intact.  Pedal pulses palpable.  Assessment/Plan Impression is right knee medial compartment arthritis with arthroscopy earlier this year which demonstrated meniscal root tear plus early chondromalacia.  Remainder of the knee looks reasonable.  Plan is right unicompartmental knee replacement.  Risk and benefits are discussed including but not limited to infection nerve vessel damage progression of arthritis as well as potential need for revision surgery.  All questions answered  Anderson Malta, MD 05/04/2020, 10:51 AM

## 2020-05-04 NOTE — Transfer of Care (Signed)
Immediate Anesthesia Transfer of Care Note  Patient: Kelly Soto  Procedure(s) Performed: right partial knee replacement (Right Knee)  Patient Location: PACU  Anesthesia Type:Spinal  Level of Consciousness: awake, alert  and oriented  Airway & Oxygen Therapy: Patient Spontanous Breathing  Post-op Assessment: Report given to RN and Post -op Vital signs reviewed and stable  Post vital signs: Reviewed and stable  Last Vitals:  Vitals Value Taken Time  BP 155/82 05/04/20 1454  Temp 36.4 C 05/04/20 1454  Pulse 50 05/04/20 1503  Resp 20 05/04/20 1503  SpO2 100 % 05/04/20 1503  Vitals shown include unvalidated device data.  Last Pain:  Vitals:   05/04/20 1454  TempSrc:   PainSc: 0-No pain         Complications: No complications documented.

## 2020-05-04 NOTE — Anesthesia Procedure Notes (Addendum)
Anesthesia Regional Block: Adductor canal block   Pre-Anesthetic Checklist: ,, timeout performed, Correct Patient, Correct Site, Correct Laterality, Correct Procedure, Correct Position, site marked, Risks and benefits discussed,  Surgical consent,  Pre-op evaluation,  At surgeon's request and post-op pain management  Laterality: Right  Prep: chloraprep       Needles:  Injection technique: Single-shot  Needle Type: Stimulator Needle - 80     Needle Length: 10cm  Needle Gauge: 21     Additional Needles:   Narrative:  Start time: 05/04/2020 10:17 AM End time: 05/04/2020 10:27 AM Injection made incrementally with aspirations every 5 mL.  Performed by: Personally

## 2020-05-04 NOTE — Anesthesia Procedure Notes (Signed)
Spinal  Patient location during procedure: OR Start time: 05/04/2020 11:24 AM End time: 05/04/2020 11:34 AM Staffing Performed: anesthesiologist  Anesthesiologist: Duane Boston, MD Preanesthetic Checklist Completed: patient identified, IV checked, risks and benefits discussed, surgical consent, monitors and equipment checked, pre-op evaluation and timeout performed Spinal Block Patient position: sitting Prep: DuraPrep Patient monitoring: cardiac monitor, continuous pulse ox and blood pressure Approach: midline Location: L2-3 Injection technique: single-shot Needle Needle type: Pencan  Needle gauge: 24 G Needle length: 9 cm Additional Notes Functioning IV was confirmed and monitors were applied. Sterile prep and drape, including hand hygiene and sterile gloves were used. The patient was positioned and the spine was prepped. The skin was anesthetized with lidocaine.  Free flow of clear CSF was obtained prior to injecting local anesthetic into the CSF.  The spinal needle aspirated freely following injection.  The needle was carefully withdrawn.  The patient tolerated the procedure well.

## 2020-05-04 NOTE — Progress Notes (Signed)
PT Cancellation Note  Patient Details Name: Kelly Soto MRN: [id number] DOB: February 07, 1956   Cancelled Treatment:    Reason Eval/Treat Not Completed: Other (comment) Per RN, pt just getting up to room and getting settled and not appropriate to work with at this time. Will hold off and follow up as schedule allows.   [name], DPT  Acute Rehabilitation Services  Pager: [phone] Office: [phone]    [name] 05/04/2020, 4:58 PM

## 2020-05-04 NOTE — Brief Op Note (Signed)
   05/04/2020  3:14 PM  PATIENT:  Kelly Soto  64 y.o. female  PRE-OPERATIVE DIAGNOSIS:  right knee osteoarthritis  POST-OPERATIVE DIAGNOSIS:  right knee osteoarthritis  PROCEDURE:  Procedure(s): right partial knee replacement  SURGEON:  Surgeon(s): Meredith Pel, MD  ASSISTANT: magnant pa  ANESTHESIA:   spinal  EBL: 75 ml    Total I/O In: 1500 [I.V.:1500] Out: 2075 [Urine:2000; Blood:75]  BLOOD ADMINISTERED: none  DRAINS: none   LOCAL MEDICATIONS USED: Marcaine morphine clonidine Exparel vancomycin powder  SPECIMEN:  No Specimen  COUNTS:  YES  TOURNIQUET:   Total Tourniquet Time Documented: Thigh (Right) - 117 minutes Total: Thigh (Right) - 117 minutes   DICTATION: .Other Dictation: Dictation [id number] pLAN OF CARE: Admit for overnight observation  PATIENT DISPOSITION:  PACU - hemodynamically stable

## 2020-05-04 NOTE — Anesthesia Procedure Notes (Signed)
Procedure Name: MAC Performed by: Michele Rockers, CRNA Pre-anesthesia Checklist: Patient identified, Emergency Drugs available, Suction available, Timeout performed and Patient being monitored Patient Re-evaluated:Patient Re-evaluated prior to induction Oxygen Delivery Method: Simple face mask

## 2020-05-05 LAB — GLUCOSE, CAPILLARY
Glucose-Capillary: 249 mg/dL — ABNORMAL HIGH (ref 70–99)
Glucose-Capillary: 154 mg/dL — ABNORMAL HIGH (ref 70–99)
Glucose-Capillary: 172 mg/dL — ABNORMAL HIGH (ref 70–99)

## 2020-05-05 MED ORDER — PROMETHAZINE HCL 25 MG PO TABS
25.0000 mg | ORAL_TABLET | Freq: Three times a day (TID) | ORAL | Status: DC | PRN
  Administered 2020-05-05: 25 mg via ORAL
  Filled 2020-05-05: qty 1

## 2020-05-05 NOTE — Progress Notes (Signed)
  Subjective: Kelly Soto is a 64 y.o. female s/p right UKA.  They are POD 1.  Pt's pain is moderate but controlled.   She has not ambulated to a significant degree yet.  She has had no of PT sessions yet.  She has had 3 episodes of vomiting early this morning after receiving her morning medication but she has had no significant nausea or vomiting since receiving Zofran.   Objective: Vital signs in last 24 hours: Temp:  [97.5 F (36.4 C)-98.2 F (36.8 C)] 97.5 F (36.4 C) (11/10 0824) Pulse Rate:  [46-71] 62 (11/10 0824) Resp:  [10-20] 19 (11/10 0824) BP: (123-241)/(54-93) 123/54 (11/10 0824) SpO2:  [96 %-100 %] 99 % (11/10 0824) Weight:  [71.5 kg] 71.5 kg (11/09 0906)  Intake/Output from previous day: 11/09 0701 - 11/10 0700 In: 2260 [P.O.:560; I.V.:1500; IV Piggyback:200] Out: 3025 [Urine:2950; Blood:75] Intake/Output this shift: No intake/output data recorded.  Exam:  No gross blood or drainage overlying the dressing 2+ DP pulse Sensation intact distally in the right foot Able to dorsiflex and plantarflex the right foot   Labs: No results for input(s): HGB in the last 72 hours. No results for input(s): WBC, RBC, HCT, PLT in the last 72 hours. No results for input(s): NA, K, CL, CO2, BUN, CREATININE, GLUCOSE, CALCIUM in the last 72 hours. No results for input(s): LABPT, INR in the last 72 hours.  Assessment/Plan: Pt is POD 1 s/p right UKA.    -Plan to discharge to home today or tomorrow pending patient's pain and PT eval  -WBAT with a walker    Gina Leblond L Layonna Dobie 05/05/2020, 8:28 AM

## 2020-05-05 NOTE — Care Management Obs Status (Signed)
MEDICARE OBSERVATION STATUS NOTIFICATION   Patient Details  Name: SHYA KOVATCH MRN: [id number] Date of Birth: 01-05-1956   Medicare Observation Status Notification Given:  Yes    Curlene Labrum, RN 05/05/2020, 10:20 AM

## 2020-05-05 NOTE — Plan of Care (Signed)
  Problem: Education: Goal: Knowledge of General Education information will improve Description Including pain rating scale, medication(s)/side effects and non-pharmacologic comfort measures Outcome: Progressing   Problem: Health Behavior/Discharge Planning: Goal: Ability to manage health-related needs will improve Outcome: Progressing   

## 2020-05-05 NOTE — Progress Notes (Signed)
Physical Therapy Treatment Patient Details Name: Kelly Soto MRN: [id number] DOB: 1955/10/25 Today's Date: 05/05/2020    History of Present Illness Pt is a 64 y.o. female s/p R unicompartmental knee replacement on 05/04/20. PMH includes HTN, OSA, anxiety, DM, arthritis.   PT Comments    Pt limited by increased pain and nausea this session (recently received meds from RN for this). Agreeable for OOB mobility to chair; session focused on seated R knee therex/ROM. Despite feeling poorly, pt moving well. Will likely benefit from at least 2x sessions tomorrow before d/c to progress transfer/gait training and perform stair training as pt has 3 steps to enter home. Will continue to follow acutely.    Follow Up Recommendations  Home health PT;Supervision for mobility/OOB;Follow surgeon's recommendation for DC plan and follow-up therapies     Equipment Recommendations  3in1 (PT)    Recommendations for Other Services       Precautions / Restrictions Precautions Precautions: Fall;Knee Precaution Comments: Reviewed knee precautions Restrictions Weight Bearing Restrictions: Yes RLE Weight Bearing: Weight bearing as tolerated    Mobility  Bed Mobility Overal bed mobility: Needs Assistance Bed Mobility: Supine to Sit     Supine to sit: Min assist;HOB elevated Sit to supine: Min assist   General bed mobility comments: MinA for RLE management; pt limited by pain and nausea  Transfers Overall transfer level: Needs assistance Equipment used: Rolling walker (2 wheeled) Transfers: Sit to/from Stand Sit to Stand: Min guard         General transfer comment: Cues for hand placement, increased time and effort with min guard, but no physical assist required; poor eccentric control secondary to R knee pain with flexion when going to sit  Ambulation/Gait Ambulation/Gait assistance: Min guard Gait Distance (Feet): 2 Feet Assistive device: Rolling walker (2 wheeled) Gait  Pattern/deviations: Step-to pattern;Trunk flexed;Antalgic;Decreased weight shift to right Gait velocity: Decreased   General Gait Details: Pivotal steps to recliner with RW and min guard, further mobility limited by pain and nausea   Stairs             Wheelchair Mobility    Modified Rankin (Stroke Patients Only)       Balance Overall balance assessment: Needs assistance   Sitting balance-Leahy Scale: Good       Standing balance-Leahy Scale: Poor Standing balance comment: Reliant on UE support                            Cognition Arousal/Alertness: Awake/alert Behavior During Therapy: WFL for tasks assessed/performed Overall Cognitive Status: Within Functional Limits for tasks assessed                                        Exercises Total Joint Exercises Ankle Circles/Pumps: AROM;Both;Seated Quad Sets: AROM;Right;Seated (5-sec holds) Long Arc Quad: AROM;Right;Seated Other Exercises Other Exercises: Passive knee flexion sitting in recliner (gravity-assisted), gastroc stretch with towel (20-sec hold)    General Comments General comments (skin integrity, edema, etc.): Pt agreeable to OOB to recliner having recently received nausea and pain medicine; further mobility limited secondary to these symptoms      Pertinent Vitals/Pain Pain Assessment: Faces Faces Pain Scale: Hurts even more Pain Location: R knee Pain Descriptors / Indicators: Discomfort;Guarding Pain Intervention(s): Premedicated before session;Monitored during session;Limited activity within patient's tolerance    Home Living  Prior Function            PT Goals (current goals can now be found in the care plan section) Acute Rehab PT Goals Patient Stated Goal: "Get back to day trips with my girls" PT Goal Formulation: With patient Time For Goal Achievement: 05/19/20 Potential to Achieve Goals: Good Progress towards PT goals:  Progressing toward goals    Frequency    7X/week      PT Plan Current plan remains appropriate    Co-evaluation              AM-PAC PT "6 Clicks" Mobility   Outcome Measure  Help needed turning from your back to your side while in a flat bed without using bedrails?: A Little Help needed moving from lying on your back to sitting on the side of a flat bed without using bedrails?: A Little Help needed moving to and from a bed to a chair (including a wheelchair)?: A Little Help needed standing up from a chair using your arms (e.g., wheelchair or bedside chair)?: A Little Help needed to walk in hospital room?: A Little Help needed climbing 3-5 steps with a railing? : A Little 6 Click Score: 18    End of Session Equipment Utilized During Treatment: Gait belt Activity Tolerance: Treatment limited secondary to medical complications (Comment) Patient left: in chair;with call bell/phone within reach;with chair alarm set Nurse Communication: Mobility status PT Visit Diagnosis: Pain;Other abnormalities of gait and mobility (R26.89) Pain - Right/Left: Right Pain - part of body: Knee     Time: 1525-1540 PT Time Calculation (min) (ACUTE ONLY): 15 min  Charges:  $Therapeutic Exercise: 8-22 mins                    Mabeline Caras, PT, DPT Acute Rehabilitation Services  Pager [phone] Office [place] 05/05/2020, 5:37 PM

## 2020-05-05 NOTE — TOC Initial Note (Signed)
Transition of Care [facility]) - Initial/Assessment Note    Patient Details  Name: Kelly Soto MRN: [id number] Date of Birth: 29-Apr-1956  Transition of Care [facility]) CM/SW Contact:    Curlene Labrum, RN Phone Number: 05/05/2020, 11:26 AM  Clinical Narrative:                 Case management met with the patient at the bedside with her husband, S/P Right uni-knee yesterday.  The patient is doing better with improved nausea and pain control this morning.  The patient was given Medicare observation notice and copy - patient and husband verbalize understanding.  The patient was given choice regarding home health services and does not have a preference.  I called [facility] and spoke with Tommi Rumps to obtain home health for PT.  Patient currently has a rolling walker and CPM at home but will need a 3:1 for home.  Patient's PCP is Dr. Brigitte Pulse.  Patient's husband will be transporting her home when she is discharged.  Will continue to follow for discharge to home when ready today or tomorrow per MD.  PT is in the room to evaluation post surgery.  Expected Discharge Plan: [place] Barriers to Discharge: No Barriers Identified   Patient Goals and CMS Choice Patient states their goals for this hospitalization and ongoing recovery are:: Patient plans to discharge home with home health. CMS Medicare.gov Compare Post Acute Care list provided to:: Patient Choice offered to / list presented to : Patient  Expected Discharge Plan and Services Expected Discharge Plan: [place]   Discharge Planning Services: CM Consult Post Acute Care Choice: Durable Medical Equipment, Home Health Living arrangements for the past 2 months: Single Family Home                 DME Arranged: 3-N-1 DME Agency: [facility] Date DME Agency Contacted: 05/05/20 Time DME Agency Contacted: 1950 Representative spoke with at DME Agency: Vikki Ports, [place] with [facility] HH Arranged: PT [place] Agency:  [place] Date [place]: 05/05/20 Time [place]: [phone] Representative spoke with at [place]: reaching out to Meredeth Ide  Prior Living Arrangements/Services Living arrangements for the past 2 months: [place] with:: Spouse Patient language and need for interpreter reviewed:: Yes Do you feel safe going back to the place where you live?: Yes      Need for Family Participation in Patient Care: Yes (Comment) Care giver support system in place?: Yes (comment) Current home services: DME (currently has CPM and RW at home - will need 3:1) Criminal Activity/Legal Involvement Pertinent to Current Situation/Hospitalization: No - Comment as needed  Activities of Daily Living Home Assistive Devices/Equipment: None ADL Screening (condition at time of admission) Patient's cognitive ability adequate to safely complete daily activities?: No Is the patient deaf or have difficulty hearing?: No Does the patient have difficulty seeing, even when wearing glasses/contacts?: No Does the patient have difficulty concentrating, remembering, or making decisions?: No Patient able to express need for assistance with ADLs?: Yes Does the patient have difficulty dressing or bathing?: No Independently performs ADLs?: Yes (appropriate for developmental age) Does the patient have difficulty walking or climbing stairs?: Yes Weakness of Legs: Right Weakness of Arms/Hands: None  Permission Sought/Granted Permission sought to share information with : Case Manager Permission granted to share information with : Yes, Verbal Permission Granted     Permission granted to share info w AGENCY: [facility], [facility] for dme  Permission granted to share info w Relationship: spouse     Emotional Assessment Appearance:: Appears stated age Attitude/Demeanor/Rapport: Gracious Affect (typically observed): Accepting Orientation: : Oriented to Self, Oriented to Place, Oriented to  Time,  Oriented to Situation Alcohol / Substance Use: Not Applicable Psych Involvement: No (comment)  Admission diagnosis:  S/P right unicompartmental knee replacement [Z96.651] Patient Active Problem List   Diagnosis Date Noted  . 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 Chronic renal insufficiency 09/24/2013  . Anemia of renal disease 09/24/2013  . H/O gastric bypass 08/22/2013  . Intestinal malabsorption 08/22/2013  . Anemia, iron deficiency 08/22/2013  . Chronic eustachian tube dysfunction 03/28/2011  . Diabetes mellitus ([place]) 03/28/2011   PCP:  [name], MD Pharmacy:   [place], [place] - [address] [place] [place] [address] Phone: [phone] Fax: [phone]     Social Determinants of Health (SDOH) Interventions    Readmission Risk Interventions No flowsheet data found.

## 2020-05-05 NOTE — Evaluation (Signed)
Physical Therapy Evaluation Patient Details Name: Kelly Soto MRN: [id number] DOB: 06/11/1956 Today's Date: 05/05/2020   History of Present Illness  Pt is a 64 y.o. female s/p R unicompartmental knee replacement on 05/04/20. PMH includes HTN, OSA, anxiety, DM, arthritis.    Clinical Impression  Pt presents with an overall decrease in functional mobility secondary to above. PTA, pt independent, active, retired and lives with supportive husband. Educ on precautions, positioning, therex, and importance of mobility. Today, pt able to initiate transfer and gait training with RW, requiring up to minA for mobility. Pt motivated to participate, although limited by nausea this session. Pt would benefit from continued acute PT services to maximize functional mobility and independence prior to d/c with HHPT services.     Follow Up Recommendations Home health PT;Supervision for mobility/OOB;Follow surgeon's recommendation for DC plan and follow-up therapies    Equipment Recommendations  3in1 (PT)    Recommendations for Other Services       Precautions / Restrictions Precautions Precautions: Fall;Knee Restrictions Weight Bearing Restrictions: Yes RLE Weight Bearing: Weight bearing as tolerated      Mobility  Bed Mobility Overal bed mobility: Needs Assistance Bed Mobility: Supine to Sit;Sit to Supine     Supine to sit: Min assist;HOB elevated Sit to supine: Min assist   General bed mobility comments: MinA for RLE management; cues for use of LLE hook under RLE for return to supine, minA to assist    Transfers Overall transfer level: Needs assistance Equipment used: Rolling walker (2 wheeled) Transfers: Sit to/from Stand Sit to Stand: Min guard         General transfer comment: Reliant on momentum to power into standing, cues for hand placement; min guard for balance  Ambulation/Gait Ambulation/Gait assistance: Min guard Gait Distance (Feet): 28 Feet Assistive device:  Rolling walker (2 wheeled) Gait Pattern/deviations: Step-to pattern;Step-through pattern;Decreased stride length;Trunk flexed;Antalgic;Decreased weight shift to right Gait velocity: Decreased   General Gait Details: Slow, antalgic gait with RW and min guard for balance; cues to increase step length and heel-to-toe gait pattern, pt able to correct and increase RLE WBAT with cues  Stairs            Wheelchair Mobility    Modified Rankin (Stroke Patients Only)       Balance Overall balance assessment: Needs assistance   Sitting balance-Leahy Scale: Good       Standing balance-Leahy Scale: Poor Standing balance comment: Reliant on UE support                             Pertinent Vitals/Pain Pain Assessment: Faces Faces Pain Scale: Hurts little more Pain Location: R knee Pain Descriptors / Indicators: Discomfort;Guarding Pain Intervention(s): Monitored during session    Home Living Family/patient expects to be discharged to:: Private residence Living Arrangements: Spouse/significant other Available Help at Discharge: Family;Available 24 hours/day Type of Home: House Home Access: Stairs to enter Entrance Stairs-Rails: Left Entrance Stairs-Number of Steps: 3 Home Layout: One level Home Equipment: Walker - 2 wheels Additional Comments: Husband works but will be home 24/7 initially to assist    Prior Function Level of Independence: Independent         Comments: Retired from work. Independent, active, drives, enjoys "day trips" with her girlfriends     Hand Dominance        Extremity/Trunk Assessment   Upper Extremity Assessment Upper Extremity Assessment: Overall WFL for tasks assessed    Lower  Extremity Assessment Lower Extremity Assessment: RLE deficits/detail RLE Deficits / Details: s/p R unicompartmental knee replacement; knee flex/ext at least 3/5 throughout, hip 3/5 limited by post-op pain and weakness RLE Coordination: decreased gross  motor       Communication   Communication: No difficulties  Cognition Arousal/Alertness: Awake/alert Behavior During Therapy: WFL for tasks assessed/performed Overall Cognitive Status: Within Functional Limits for tasks assessed                                        General Comments General comments (skin integrity, edema, etc.): Husband Data processing manager) present and supportive. Mobility limited by nausea this session; pt also fatigued having just completed washup with set-up from NT    Exercises Total Joint Exercises Ankle Circles/Pumps: AROM;Both;Seated Long Arc Quad: AROM;Right;Seated   Assessment/Plan    PT Assessment Patient needs continued PT services  PT Problem List Decreased strength;Decreased range of motion;Decreased activity tolerance;Decreased balance;Decreased mobility;Decreased knowledge of use of DME;Decreased knowledge of precautions;Pain       PT Treatment Interventions DME instruction;Gait training;Stair training;Functional mobility training;Therapeutic activities;Therapeutic exercise;Balance training;Patient/family education    PT Goals (Current goals can be found in the Care Plan section)  Acute Rehab PT Goals Patient Stated Goal: "Get back to day trips with my girls" PT Goal Formulation: With patient Time For Goal Achievement: 05/19/20 Potential to Achieve Goals: Good    Frequency 7X/week   Barriers to discharge        Co-evaluation               AM-PAC PT "6 Clicks" Mobility  Outcome Measure Help needed turning from your back to your side while in a flat bed without using bedrails?: A Little Help needed moving from lying on your back to sitting on the side of a flat bed without using bedrails?: A Little Help needed moving to and from a bed to a chair (including a wheelchair)?: A Little Help needed standing up from a chair using your arms (e.g., wheelchair or bedside chair)?: A Little Help needed to walk in hospital room?: A  Little Help needed climbing 3-5 steps with a railing? : A Little 6 Click Score: 18    End of Session Equipment Utilized During Treatment: Gait belt Activity Tolerance: Patient tolerated treatment well Patient left: in bed;with call bell/phone within reach Nurse Communication: Mobility status PT Visit Diagnosis: Pain;Other abnormalities of gait and mobility (R26.89) Pain - Right/Left: Right Pain - part of body: Knee    Time: 1120-1140 PT Time Calculation (min) (ACUTE ONLY): 20 min   Charges:   PT Evaluation $PT Eval Low Complexity: 1 Low     Mabeline Caras, PT, DPT Acute Rehabilitation Services  Pager [phone] Office [place] 05/05/2020, 3:17 PM

## 2020-05-05 NOTE — Progress Notes (Signed)
Contacted [place], [place] due to patient complaint of severe nausea that is preventing her from taking ordered pain medications. Patient is in severe pain.  Order received for Phenergan.

## 2020-05-05 NOTE — Op Note (Signed)
NAME: Kelly, Soto MEDICAL RECORD ZL:[id number] ACCOUNT [id number] DATE OF BIRTH:March 11, 1956 FACILITY: [facility] LOCATION: [facility] PHYSICIAN:Paxtyn Boyar Randel Pigg, MD  OPERATIVE REPORT  DATE OF PROCEDURE:  05/04/2020  PREOPERATIVE DIAGNOSIS:  Right knee medial compartment arthritis.  POSTOPERATIVE DIAGNOSIS:  Right knee medial compartment arthritis.  PROCEDURE:  Right knee unicompartmental knee replacement using Oxford extra small size femur cemented, size A tibial tray with a size 4 meniscal bearing insert.  SURGEON:  Meredith Pel, MD  ASSISTANT:  Annie Main, PA.  INDICATIONS:  The patient is a 64 year old patient with right knee medial meniscal tear and medial compartment pathology refractory to nonoperative management.  She presents now for operative management after explanation of risks and benefits.  PROCEDURE IN DETAIL:  The patient was brought to the operating room where spinal anesthetic was induced.  Preoperative IV antibiotics were administered.  Timeout was called.  Right leg prescrubbed with alcohol and Betadine, allowed to air dry, prepped  with DuraPrep solution and draped in sterile manner.  Charlie Pitter was used to cover the operative field.  The leg was elevated, exsanguinated using Esmarch wrap.  Tourniquet was inflated.  Median parapatellar approach was made after anterior approach to knee.   IrriSept solution utilized after the incision as well as after the arthrotomy.  Arthrotomy taken to about 2 cm above the patella.  The lateral compartment was intact.  Medial compartment had significant wear on both the tibial and femoral sides.  Loose  chondral flaps were present.  The fat pad was partially excised.  Next, the femur was sized to an extra small.  Size 3 jig was placed between the femur and the tibia.  The G clamp was then fixed to the tibial cutting guide, which was parallel to the  tibial crest in proper alignment.  Initial cut was made, both the vertical and horizontal  cut.  This was later revised 2 more millimeters.  Required later revision for 3 more millimeters in order to achieve appropriate spacing and resection on the tibia.   With the tibial cut, the intramedullary rod was placed.  Femoral cutting jig was then applied in the middle of the condyle.  The appropriate posterior femoral cut was made.  Then, the femoral cut was initially made and then with a zero spigot.  This  was later taken back to a #1 spigot for further femoral resection.  Trial components then placed after resection on the tibia and femur.  With the tibial tray in place and the femoral trial in place, we were able to fit both the 3 and 4 mm spacer in  position.  The proximal anterior reaming was then performed.  Final preparation was made on both the femoral and tibial sides.  At this time, thorough irrigation was performed.  Meniscus was resected.  The components were then cemented into position and  after cement hardening had occurred and excess cement was removed, the construct was retrialed with both the 3 mm, 4 mm and 5 mm meniscal bearing.  The 4 mm bearing gave full extension, full flexion, good stability and it tracked well.  This 4 mm bearing  was chosen.  It was placed and the same stability parameters were maintained.  Tourniquet released.  Bleeding points encountered controlled using electrocautery.  Next, knee arthrotomy was closed over a bolster after irrigating with 3 liters of pouring  solution and then placing both the IrriSept solution followed by vancomycin powder within the knee joint.  The closure was then performed using  0 Vicryl suture, 2-0 Vicryl suture and 3-0 Monocryl.  Steri-Strips, Aquacel dressing applied.  Luke's  assistance was required at all times during the case for retraction, opening and closing, mobilization of tissue.  His assistance was medical necessity.  A solution of Marcaine, morphine, clonidine was also injected into the knee for postop pain relief  after  the closure of the arthrotomy.  HN/NUANCE  D:05/04/2020 T:05/05/2020 JOB:[id number]

## 2020-05-06 ENCOUNTER — Encounter (HOSPITAL_COMMUNITY): Payer: Self-pay | Admitting: Orthopedic Surgery

## 2020-05-06 DIAGNOSIS — S83241A Other tear of medial meniscus, current injury, right knee, initial encounter: Secondary | ICD-10-CM | POA: Diagnosis present

## 2020-05-06 DIAGNOSIS — Z79899 Other long term (current) drug therapy: Secondary | ICD-10-CM | POA: Diagnosis not present

## 2020-05-06 DIAGNOSIS — Z8249 Family history of ischemic heart disease and other diseases of the circulatory system: Secondary | ICD-10-CM | POA: Diagnosis not present

## 2020-05-06 DIAGNOSIS — H04123 Dry eye syndrome of bilateral lacrimal glands: Secondary | ICD-10-CM | POA: Diagnosis present

## 2020-05-06 DIAGNOSIS — M1711 Unilateral primary osteoarthritis, right knee: Secondary | ICD-10-CM | POA: Diagnosis present

## 2020-05-06 DIAGNOSIS — K219 Gastro-esophageal reflux disease without esophagitis: Secondary | ICD-10-CM | POA: Diagnosis present

## 2020-05-06 DIAGNOSIS — E1165 Type 2 diabetes mellitus with hyperglycemia: Secondary | ICD-10-CM | POA: Diagnosis present

## 2020-05-06 DIAGNOSIS — Z823 Family history of stroke: Secondary | ICD-10-CM | POA: Diagnosis not present

## 2020-05-06 DIAGNOSIS — R112 Nausea with vomiting, unspecified: Secondary | ICD-10-CM | POA: Diagnosis not present

## 2020-05-06 DIAGNOSIS — Z20822 Contact with and (suspected) exposure to covid-19: Secondary | ICD-10-CM | POA: Diagnosis present

## 2020-05-06 DIAGNOSIS — Z888 Allergy status to other drugs, medicaments and biological substances status: Secondary | ICD-10-CM | POA: Diagnosis not present

## 2020-05-06 DIAGNOSIS — Z9884 Bariatric surgery status: Secondary | ICD-10-CM | POA: Diagnosis not present

## 2020-05-06 DIAGNOSIS — G4730 Sleep apnea, unspecified: Secondary | ICD-10-CM | POA: Diagnosis present

## 2020-05-06 DIAGNOSIS — I10 Essential (primary) hypertension: Secondary | ICD-10-CM | POA: Diagnosis present

## 2020-05-06 DIAGNOSIS — F419 Anxiety disorder, unspecified: Secondary | ICD-10-CM | POA: Diagnosis present

## 2020-05-06 DIAGNOSIS — Z886 Allergy status to analgesic agent status: Secondary | ICD-10-CM | POA: Diagnosis not present

## 2020-05-06 LAB — GLUCOSE, CAPILLARY
Glucose-Capillary: 225 mg/dL — ABNORMAL HIGH (ref 70–99)
Glucose-Capillary: 166 mg/dL — ABNORMAL HIGH (ref 70–99)

## 2020-05-06 MED ORDER — PROMETHAZINE HCL 25 MG PO TABS: 25.0000 mg | ORAL_TABLET | Freq: Four times a day (QID) | ORAL | Status: DC | PRN

## 2020-05-06 NOTE — Progress Notes (Signed)
  Subjective: Kelly Soto is a 64 y.o. female s/p right UKA.  They are POD2.  Pt's pain is controlled but moderate. Pt has ambulated with moderate difficulty.     Objective: Vital signs in last 24 hours: Temp:  [97.5 F (36.4 C)-98.6 F (37 C)] 97.9 F (36.6 C) (11/11 0350) Pulse Rate:  [62-100] 100 (11/11 0350) Resp:  [16-19] 17 (11/11 0350) BP: (117-166)/(54-77) 165/77 (11/11 0350) SpO2:  [94 %-99 %] 94 % (11/11 0350)  Intake/Output from previous day: No intake/output data recorded. Intake/Output this shift: No intake/output data recorded.  Exam:  No gross blood or drainage overlying the dressing 2+ DP pulse Sensation intact distally in the right foot Able to dorsiflex and plantarflex the right foot   Labs: No results for input(s): HGB in the last 72 hours. No results for input(s): WBC, RBC, HCT, PLT in the last 72 hours. No results for input(s): NA, K, CL, CO2, BUN, CREATININE, GLUCOSE, CALCIUM in the last 72 hours. No results for input(s): LABPT, INR in the last 72 hours.  Assessment/Plan: Pt is POD2 s/p right UKA.    -Plan to discharge to home today or tomorrow pending patient's pain and PT eval  -WBAT with a walker    Brockton Mckesson L Janissa Bertram 05/06/2020, 8:19 AM

## 2020-05-06 NOTE — Progress Notes (Signed)
Physical Therapy Treatment Patient Details Name: Kelly Soto MRN: [id number] DOB: 1956-03-09 Today's Date: 05/06/2020    History of Present Illness Pt is a 64 y.o. female s/p R unicompartmental knee replacement on 05/04/20. PMH includes HTN, OSA, anxiety, DM, arthritis.   PT Comments    Pt progressing well with mobility. Today's session focused on stair training; pt able to ascend/descend 2 steps with rail support and close min guard for balance; husband present throughout session and supportive. Family able to demonstrate good guarding technique during standing mobility. Pt and husband prepared for d/c home this afternoon; have no further questions or concerns.   Follow Up Recommendations  Home health PT;Supervision for mobility/OOB;Follow surgeon's recommendation for DC plan and follow-up therapies     Equipment Recommendations  3in1 (PT)    Recommendations for Other Services       Precautions / Restrictions Precautions Precautions: Fall;Knee Precaution Comments: Reviewed knee precautions Restrictions Weight Bearing Restrictions: Yes RLE Weight Bearing: Weight bearing as tolerated    Mobility  Bed Mobility Overal bed mobility: Needs Assistance Bed Mobility: Supine to Sit;Sit to Supine     Supine to sit: Supervision Sit to supine: Min assist   General bed mobility comments: Received sitting in recliner  Transfers Overall transfer level: Needs assistance Equipment used: Rolling walker (2 wheeled) Transfers: Sit to/from Stand Sit to Stand: Min guard         General transfer comment: Improving ability to stand with good carryover of correct hand placement; able to stand from recliner and BSC (over toilet) with husband providing min guard with gait belt; multiple additional sit<>stand from recliner with min guard to supervision  Ambulation/Gait Ambulation/Gait assistance: Min guard Gait Distance (Feet): 40 Feet Assistive device: Rolling walker (2  wheeled) Gait Pattern/deviations: Step-through pattern;Decreased stride length;Decreased weight shift to right;Antalgic;Trunk flexed Gait velocity: Decreased   General Gait Details: Slow, antalgic gait with RW and intermittent min guard for balance; improved step length and proximity to RW with cues; limited distance secondary to pain and wanting to prioritize stair training   Stairs Stairs: Yes Stairs assistance: Min guard Stair Management: Step to pattern;Forwards;Backwards;One rail Left Number of Stairs: 2 General stair comments: Ascend/descend 2 steps with close min guard and BUE support on L-side rail; husband present to observe; educ on technique and sequencing; increased time and effort secondary to pain   Wheelchair Mobility    Modified Rankin (Stroke Patients Only)       Balance Overall balance assessment: Needs assistance   Sitting balance-Leahy Scale: Good       Standing balance-Leahy Scale: Poor Standing balance comment: Reliant on UE support                            Cognition Arousal/Alertness: Awake/alert Behavior During Therapy: WFL for tasks assessed/performed Overall Cognitive Status: Within Functional Limits for tasks assessed                                        Exercises General Exercises - Lower Extremity Quad Sets: AROM;Both;Seated Long Arc Quad: AAROM;Right;Seated (assist from LLE) Heel Slides: AAROM;Right;Seated (assist from LLE) Toe Raises: AROM;Right;Seated Heel Raises: AROM;Right;Seated Other Exercises Other Exercises: Passive knee flexion sitting at edge of recliner (foot dangling), LAQ Other Exercises: R knee flexion AROM 66'    General Comments General comments (skin integrity, edema, etc.): Husband Data processing manager) present  and supportive; provided gait belt and demonstrated use for safe mobility at home      Pertinent Vitals/Pain Pain Assessment: Faces Faces Pain Scale: Hurts even more Pain Location: R  knee Pain Descriptors / Indicators: Discomfort;Guarding Pain Intervention(s): Monitored during session;Premedicated before session    Home Living                      Prior Function            PT Goals (current goals can now be found in the care plan section) Progress towards PT goals: Progressing toward goals    Frequency    7X/week      PT Plan Current plan remains appropriate    Co-evaluation              AM-PAC PT "6 Clicks" Mobility   Outcome Measure  Help needed turning from your back to your side while in a flat bed without using bedrails?: A Little Help needed moving from lying on your back to sitting on the side of a flat bed without using bedrails?: A Little Help needed moving to and from a bed to a chair (including a wheelchair)?: A Little Help needed standing up from a chair using your arms (e.g., wheelchair or bedside chair)?: A Little Help needed to walk in hospital room?: A Little Help needed climbing 3-5 steps with a railing? : A Little 6 Click Score: 18    End of Session Equipment Utilized During Treatment: Gait belt Activity Tolerance: Patient tolerated treatment well Patient left: in chair;with call bell/phone within reach;with family/visitor present Nurse Communication: Mobility status PT Visit Diagnosis: Pain;Other abnormalities of gait and mobility (R26.89) Pain - Right/Left: Right Pain - part of body: Knee     Time: 2426-8341 PT Time Calculation (min) (ACUTE ONLY): 30 min  Charges:  $Gait Training: 8-22 mins $Therapeutic Exercise: 8-22 mins                   Mabeline Caras, PT, DPT Acute Rehabilitation Services  Pager [phone] Office [place] 05/06/2020, 3:24 PM

## 2020-05-06 NOTE — Progress Notes (Signed)
Patient stable.  Nausea has improved Right knee dressing intact Dorsiflexion intact with perfused foot Plan at this time is physical therapy to work on stairs.  If she is successful with that then we could potentially arrange for discharge home today.  Continue with CPM machine.

## 2020-05-06 NOTE — Progress Notes (Signed)
Physical Therapy Treatment Patient Details Name: Kelly Soto MRN: [id number] DOB: 1955/09/05 Today's Date: 05/06/2020    History of Present Illness Pt is a 64 y.o. female s/p R unicompartmental knee replacement on 05/04/20. PMH includes HTN, OSA, anxiety, DM, arthritis.   PT Comments    Pt progressing well with mobility. Session focused on bed mobility, transfer and gait training with RW; pt demonstrates increasing activity tolerance, although still limited by c/o pain and nausea. Husband present and supportive, has been helping pt mobilize in room. Will plan to initiate stair training next session.    Follow Up Recommendations  Home health PT;Supervision for mobility/OOB;Follow surgeon's recommendation for DC plan and follow-up therapies     Equipment Recommendations  3in1 (PT)    Recommendations for Other Services       Precautions / Restrictions Precautions Precautions: Fall;Knee Precaution Comments: Reviewed knee precautions Restrictions Weight Bearing Restrictions: Yes RLE Weight Bearing: Weight bearing as tolerated    Mobility  Bed Mobility Overal bed mobility: Needs Assistance Bed Mobility: Supine to Sit;Sit to Supine     Supine to sit: Supervision Sit to supine: Min assist   General bed mobility comments: Practiced supine<>sith with HOB flat, trialled use of LLE to assist RLE, as well as gait belt "hook" for AAROM; pt requires minA for RLE management into bed, able to perform sitting with LLE assist  Transfers Overall transfer level: Needs assistance Equipment used: Rolling walker (2 wheeled) Transfers: Sit to/from Stand Sit to Stand: Min guard         General transfer comment: Cues for hand placement, increased time and effort with min guard, but no physical assist required; improving eccentric control into sitting and ability to tolerate R knee flexion  Ambulation/Gait Ambulation/Gait assistance: Min guard Gait Distance (Feet): 100 Feet Assistive  device: Rolling walker (2 wheeled) Gait Pattern/deviations: Step-through pattern;Decreased stride length;Decreased weight shift to right;Antalgic;Trunk flexed Gait velocity: Decreased   General Gait Details: Slow, antalgic gait with RW and intermittent min guard for balance; improved step length and proximity to RW with cues; further distance limited by pain and fatigue   Stairs             Wheelchair Mobility    Modified Rankin (Stroke Patients Only)       Balance Overall balance assessment: Needs assistance   Sitting balance-Leahy Scale: Good       Standing balance-Leahy Scale: Poor Standing balance comment: Reliant on UE support                            Cognition Arousal/Alertness: Awake/alert Behavior During Therapy: WFL for tasks assessed/performed Overall Cognitive Status: Within Functional Limits for tasks assessed                                        Exercises Other Exercises Other Exercises: Passive knee flexion sitting at edge of recliner (foot dangling), LAQ    General Comments General comments (skin integrity, edema, etc.): Husband Data processing manager) present and supportive; provided gait belt and demonstrated use for safe mobility at home      Pertinent Vitals/Pain Pain Assessment: Faces Faces Pain Scale: Hurts even more Pain Location: R knee Pain Descriptors / Indicators: Discomfort;Guarding Pain Intervention(s): Premedicated before session;Limited activity within patient's tolerance;Monitored during session    Home Living  Prior Function            PT Goals (current goals can now be found in the care plan section) Progress towards PT goals: Progressing toward goals    Frequency    7X/week      PT Plan Current plan remains appropriate    Co-evaluation              AM-PAC PT "6 Clicks" Mobility   Outcome Measure  Help needed turning from your back to your side while in a  flat bed without using bedrails?: A Little Help needed moving from lying on your back to sitting on the side of a flat bed without using bedrails?: A Little Help needed moving to and from a bed to a chair (including a wheelchair)?: A Little Help needed standing up from a chair using your arms (e.g., wheelchair or bedside chair)?: A Little Help needed to walk in hospital room?: A Little Help needed climbing 3-5 steps with a railing? : A Little 6 Click Score: 18    End of Session Equipment Utilized During Treatment: Gait belt Activity Tolerance: Patient tolerated treatment well Patient left: in chair;with call bell/phone within reach;with family/visitor present Nurse Communication: Mobility status PT Visit Diagnosis: Pain;Other abnormalities of gait and mobility (R26.89) Pain - Right/Left: Right Pain - part of body: Knee     Time: 9357-0177 PT Time Calculation (min) (ACUTE ONLY): 26 min  Charges:  $Gait Training: 8-22 mins $Therapeutic Activity: 8-22 mins                     Mabeline Caras, PT, DPT Acute Rehabilitation Services  Pager [phone] Office [place] 05/06/2020, 12:36 PM

## 2020-05-07 ENCOUNTER — Telehealth: Payer: Self-pay | Admitting: Orthopedic Surgery

## 2020-05-07 LAB — GLUCOSE, CAPILLARY: Glucose-Capillary: 223 mg/dL — ABNORMAL HIGH (ref 70–99)

## 2020-05-07 MED ORDER — OXYCODONE HCL 5 MG PO TABS: 5.0000 mg | ORAL_TABLET | ORAL | 0 refills | Status: DC | PRN

## 2020-05-07 MED ORDER — METHOCARBAMOL 500 MG PO TABS: 500.0000 mg | ORAL_TABLET | Freq: Three times a day (TID) | ORAL | 0 refills | Status: DC | PRN

## 2020-05-07 MED ORDER — ONDANSETRON HCL 4 MG PO TABS: 4.0000 mg | ORAL_TABLET | Freq: Four times a day (QID) | ORAL | 0 refills | Status: DC | PRN

## 2020-05-07 MED ORDER — RIVAROXABAN 10 MG PO TABS
10.0000 mg | ORAL_TABLET | Freq: Every day | ORAL | 0 refills | Status: DC
Start: 2020-05-07 — End: 2020-07-05

## 2020-05-07 NOTE — Plan of Care (Signed)

## 2020-05-07 NOTE — Telephone Encounter (Signed)
Patient's husband called requesting a new generic brand to xarelto. Patient's husband Rush Landmark states medication is $400.00 with insurance. Please send new medication to pharmacy on file and call patient at [phone].

## 2020-05-07 NOTE — Progress Notes (Signed)
Physical Therapy Treatment Patient Details Name: Kelly Soto MRN: [id number] DOB: 1955/12/06 Today's Date: 05/07/2020    History of Present Illness Pt is a 64 y.o. female s/p R unicompartmental knee replacement on 05/04/20. PMH includes HTN, OSA, anxiety, DM, arthritis.   PT Comments    Pt progressing well with mobility. Trialled additional transfer, gait and stair training; husband present and able to guard with gait belt, educ on technique and sequencing. Pt motivated for d/c home this morning. Reviewed educ re: precautions, positioning, therex/ROM, activity recommendations, fall risk reduction and importance of mobility. Continue to recommend follow-up with HHPT services to maximize functional mobility and independence.    Follow Up Recommendations  Home health PT;Supervision for mobility/OOB;Follow surgeon's recommendation for DC plan and follow-up therapies     Equipment Recommendations  3in1 (PT)    Recommendations for Other Services       Precautions / Restrictions Precautions Precautions: Fall;Knee Precaution Comments: Reviewed knee precautions (pt with good recall) Restrictions Weight Bearing Restrictions: Yes RLE Weight Bearing: Weight bearing as tolerated    Mobility  Bed Mobility               General bed mobility comments: Received sitting in recliner  Transfers Overall transfer level: Needs assistance Equipment used: Rolling walker (2 wheeled) Transfers: Sit to/from Stand Sit to Stand: Supervision         General transfer comment: Improving ability to stand with good carryover of correct hand placement; performed multiple sit<>stands from recliner to RW with supervision for safety, husband present to don/doff gait belt and provide min guard for transfers  Ambulation/Gait Ambulation/Gait assistance: Supervision;Min guard Gait Distance (Feet): 160 Feet Assistive device: Rolling walker (2 wheeled) Gait Pattern/deviations: Step-through  pattern;Decreased stride length;Decreased weight shift to right;Antalgic;Trunk flexed Gait velocity: Decreased   General Gait Details: Slow, antalgic gait with RW supervision for safety; husband present and providing min guard with gait belt donner; improved step length and heel to toe gait pattern with distance   Stairs Stairs: Yes Stairs assistance: Min guard Stair Management: Step to pattern;Forwards;Backwards;One rail Left Number of Stairs: 2 General stair comments: Ascend/descend 2 steps with BUE support on L-side rail; husband present to provide min guard with gait belt, PT also close for additional guarding as needed; educ on technique and sequencing   Wheelchair Mobility    Modified Rankin (Stroke Patients Only)       Balance Overall balance assessment: Needs assistance   Sitting balance-Leahy Scale: Good       Standing balance-Leahy Scale: Poor Standing balance comment: Reliant on UE support                            Cognition Arousal/Alertness: Awake/alert Behavior During Therapy: WFL for tasks assessed/performed Overall Cognitive Status: Within Functional Limits for tasks assessed                                        Exercises      General Comments General comments (skin integrity, edema, etc.): Husband Data processing manager) present and supportive, practiced providing guarding for mobility with gait belt donned. Reviewed precautions, positioning, activity recommendations      Pertinent Vitals/Pain Pain Assessment: Faces Faces Pain Scale: Hurts even more Pain Location: R knee Pain Descriptors / Indicators: Discomfort;Guarding Pain Intervention(s): Monitored during session;Limited activity within patient's tolerance    Home Living  Prior Function            PT Goals (current goals can now be found in the care plan section) Progress towards PT goals: Progressing toward goals    Frequency     7X/week      PT Plan Current plan remains appropriate    Co-evaluation              AM-PAC PT "6 Clicks" Mobility   Outcome Measure  Help needed turning from your back to your side while in a flat bed without using bedrails?: A Little Help needed moving from lying on your back to sitting on the side of a flat bed without using bedrails?: A Little Help needed moving to and from a bed to a chair (including a wheelchair)?: A Little Help needed standing up from a chair using your arms (e.g., wheelchair or bedside chair)?: A Little Help needed to walk in hospital room?: A Little Help needed climbing 3-5 steps with a railing? : A Little 6 Click Score: 18    End of Session Equipment Utilized During Treatment: Gait belt Activity Tolerance: Patient tolerated treatment well Patient left: in chair;with call bell/phone within reach;with family/visitor present Nurse Communication: Mobility status PT Visit Diagnosis: Pain;Other abnormalities of gait and mobility (R26.89) Pain - Right/Left: Right Pain - part of body: Knee     Time: 0915-0940 PT Time Calculation (min) (ACUTE ONLY): 25 min  Charges:  $Gait Training: 23-37 mins                    Mabeline Caras, PT, DPT Acute Rehabilitation Services  Pager [phone] Office [place] 05/07/2020, 10:04 AM

## 2020-05-07 NOTE — Progress Notes (Signed)
Provided discharge education/instructions, all questions and concerns addressed, Pt not in distress, discharged home with belongings accompanied by husband. 

## 2020-05-07 NOTE — Telephone Encounter (Signed)
Called, they paid and got a slight discount from [place]

## 2020-05-07 NOTE — Progress Notes (Signed)
  Subjective: Kelly Soto is a 64 y.o. female s/p right UKA.  They are POD3.  Pt's pain is controlled.  Nausea persists but no vomiting.  Pt denies dizziness, lightheadedness.  Pt has ambulated with some difficulty.  Was able to do stair training yesterday.  Objective: Vital signs in last 24 hours: Temp:  [97.4 F (36.3 C)-98 F (36.7 C)] 98 F (36.7 C) (11/12 0838) Pulse Rate:  [87-93] 87 (11/12 0838) Resp:  [17-18] 17 (11/12 0838) BP: (144-162)/(70-82) 162/81 (11/12 0838) SpO2:  [94 %-97 %] 95 % (11/12 0838)  Intake/Output from previous day: 11/11 0701 - 11/12 0700 In: 720 [P.O.:720] Out: -  Intake/Output this shift: No intake/output data recorded.  Exam:  No gross blood or drainage overlying the dressing 2+ DP pulse Sensation intact distally in the right foot Able to dorsiflex and plantarflex the right foot   Labs: No results for input(s): HGB in the last 72 hours. No results for input(s): WBC, RBC, HCT, PLT in the last 72 hours. No results for input(s): NA, K, CL, CO2, BUN, CREATININE, GLUCOSE, CALCIUM in the last 72 hours. No results for input(s): LABPT, INR in the last 72 hours.  Assessment/Plan: Pt is POD3 s/p right UKA.    -Plan to discharge to home today   -WBAT with a walker   Kelly Soto L Kelly Soto 05/07/2020, 9:22 AM

## 2020-05-07 NOTE — Telephone Encounter (Signed)
Please see below and advise.

## 2020-05-11 ENCOUNTER — Telehealth: Payer: Self-pay

## 2020-05-11 NOTE — Telephone Encounter (Signed)
Patient called in wanting to confirm her follow up appt. Says her paperwork says 2 weeks but she has been sch for 11/17 .. wants to make sure if she needs follow up a week after surgery or 2 weeks

## 2020-05-12 ENCOUNTER — Ambulatory Visit (INDEPENDENT_AMBULATORY_CARE_PROVIDER_SITE_OTHER): Payer: Medicare Other | Admitting: Orthopedic Surgery

## 2020-05-12 ENCOUNTER — Other Ambulatory Visit: Payer: Self-pay

## 2020-05-12 ENCOUNTER — Ambulatory Visit (INDEPENDENT_AMBULATORY_CARE_PROVIDER_SITE_OTHER): Payer: Medicare Other

## 2020-05-12 DIAGNOSIS — Z96659 Presence of unspecified artificial knee joint: Secondary | ICD-10-CM | POA: Diagnosis not present

## 2020-05-12 DIAGNOSIS — M1711 Unilateral primary osteoarthritis, right knee: Secondary | ICD-10-CM

## 2020-05-12 MED ORDER — METHOCARBAMOL 500 MG PO TABS
500.0000 mg | ORAL_TABLET | Freq: Three times a day (TID) | ORAL | 0 refills | Status: DC | PRN
Start: 2020-05-12 — End: 2020-05-14

## 2020-05-12 MED ORDER — OXYCODONE HCL 5 MG PO TABS: 5.0000 mg | ORAL_TABLET | Freq: Four times a day (QID) | ORAL | 0 refills | Status: DC | PRN

## 2020-05-12 NOTE — Telephone Encounter (Signed)
Patient seen this morning

## 2020-05-14 ENCOUNTER — Telehealth: Payer: Self-pay | Admitting: Orthopedic Surgery

## 2020-05-14 MED ORDER — METHOCARBAMOL 500 MG PO TABS: 500.0000 mg | ORAL_TABLET | Freq: Three times a day (TID) | ORAL | 0 refills | Status: DC | PRN

## 2020-05-14 NOTE — Telephone Encounter (Signed)
tyvm

## 2020-05-14 NOTE — Telephone Encounter (Signed)
Pt called stating her methocarbamol rx will run out on 05/15/20 and the pharmacy told her they won't send the refill request until 05/15/20 but we will be closed so the pt would like to see if we can send the authorization in today so she won't have to wait the whole weekend; pt would like a CB when this has been done.   [phone]

## 2020-05-14 NOTE — Telephone Encounter (Signed)
I resubmitted rxrf for patient

## 2020-05-16 ENCOUNTER — Encounter: Payer: Self-pay | Admitting: Orthopedic Surgery

## 2020-05-16 NOTE — Progress Notes (Signed)
Post-Op Visit Note   Patient: Kelly Soto           Date of Birth: 1956-01-30           MRN: [id number] Visit Date: 05/12/2020 PCP: [name], MD   Assessment & Plan:  Chief Complaint:  Chief Complaint  Patient presents with  . post op    right partial knee on 05/04/20   Visit Diagnoses:  1. Unilateral primary osteoarthritis, right knee   2. History of prosthetic unicompartmental arthroplasty of knee     Plan: Patient is a 64 year old female who presents s/p right knee partial knee replacement on 05/04/2020.  He states that she is doing well overall.  Her nausea has resolved since being discharged from the hospital.  She is up to 72 degrees on CPM machine and doing home of physical therapy.  She is ambulating with a walker.  She is taking oxycodone about every 4 hours as well as Robaxin about every 6 hours.  On exam her incision is healing well and has no significant surrounding erythema or any evidence of dehiscence.  She does have a little bit of difficulty with full right knee extension and performing straight leg raises but her quadricep is firing on exam today.  She has 5 to 10 degrees of extension with 75 degrees of flexion.  No calf tenderness on exam.  Negative Homans' sign.  She is on Xarelto for DVT prophylaxis due to being unable to take NSAIDs with her history of gastric bypass.  Plan to transition from home health physical therapy to outpatient physical therapy at [facility].  Refill medications.  Radiographs of the right knee were taken today and show partial right knee prosthesis in good position.  Follow-up in 2 weeks for clinical recheck regarding motion and strength.  Follow-Up Instructions: No follow-ups on file.   Orders:  Orders Placed This Encounter  Procedures  . XR Knee 1-2 Views Right   Meds ordered this encounter  Medications  . DISCONTD: methocarbamol (ROBAXIN) 500 MG tablet    Sig: Take 1 tablet (500 mg total) by mouth every 8 (eight) hours as  needed for muscle spasms.    Dispense:  30 tablet    Refill:  0  . oxyCODONE (OXY IR/ROXICODONE) 5 MG immediate release tablet    Sig: Take 1 tablet (5 mg total) by mouth every 6 (six) hours as needed for moderate pain (pain score 4-6).    Dispense:  30 tablet    Refill:  0    Imaging: No results found.  PMFS History: Patient Active Problem List   Diagnosis Date Noted  . 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 Chronic renal insufficiency 09/24/2013  . Anemia of renal disease 09/24/2013  . H/O gastric bypass 08/22/2013  . Intestinal malabsorption 08/22/2013  . Anemia, iron deficiency 08/22/2013  . Chronic eustachian tube dysfunction 03/28/2011  . Diabetes mellitus ([place]) 03/28/2011   Past Medical History:  Diagnosis Date  . Anemia of renal disease 09/24/2013  . Anxiety   . Arthritis   . Chronic renal insufficiency 09/24/2013  . Diabetes mellitus without complication ([place])    Type II  . Gastroparesis   . GERD (gastroesophageal reflux disease)   . Headache    migrains  . History of hiatal hernia   . Hypertension   . PONV (postoperative nausea and vomiting)   . Sleep apnea    Has not used the Cpap since having gastric bypass surgery.  [name]  Ulcer     Family History  Problem Relation Age of Onset  . Atrial fibrillation Mother   . Heart attack Father   . Sudden Cardiac Death Father   . Stroke Maternal Grandmother     Past Surgical History:  Procedure Laterality Date  . ABDOMINAL HYSTERECTOMY  1992  . BREAST BIOPSY Left 03/26/2017   benign  . CARDIAC CATHETERIZATION     29yrs ago- negative  . CARPAL TUNNEL RELEASE Bilateral   . CHOLECYSTECTOMY N/A 05/11/2016   Procedure: LAPAROSCOPIC CHOLECYSTECTOMY;  Surgeon: Erroll Luna, MD;  Location: [facility];  Service: General;  Laterality: N/A;  . EYE SURGERY     Bilateral cataracts  . GASTRIC BYPASS  2009  . PARTIAL KNEE ARTHROPLASTY Right 05/04/2020   Procedure: right partial knee replacement;  Surgeon: Meredith Pel, MD;  Location: [facility];  Service: Orthopedics;  Laterality: Right;   Social History   Occupational History  . Not on file  Tobacco Use  . Smoking status: Never Smoker  . Smokeless tobacco: Never Used  . Tobacco comment: never used tobacco  Vaping Use  . Vaping Use: Never used  Substance and Sexual Activity  . Alcohol use: No    Alcohol/week: 0.0 standard drinks  . Drug use: No  . Sexual activity: Yes

## 2020-05-18 ENCOUNTER — Telehealth: Payer: Self-pay | Admitting: Orthopedic Surgery

## 2020-05-18 ENCOUNTER — Inpatient Hospital Stay (HOSPITAL_BASED_OUTPATIENT_CLINIC_OR_DEPARTMENT_OTHER): Payer: Medicare Other | Admitting: Hematology & Oncology

## 2020-05-18 ENCOUNTER — Encounter: Payer: Self-pay | Admitting: *Deleted

## 2020-05-18 ENCOUNTER — Encounter: Payer: Self-pay | Admitting: Hematology & Oncology

## 2020-05-18 ENCOUNTER — Inpatient Hospital Stay: Payer: Medicare Other | Attending: Hematology & Oncology

## 2020-05-18 ENCOUNTER — Other Ambulatory Visit: Payer: Self-pay | Admitting: Surgical

## 2020-05-18 ENCOUNTER — Inpatient Hospital Stay: Payer: Medicare Other

## 2020-05-18 ENCOUNTER — Telehealth: Payer: Self-pay

## 2020-05-18 ENCOUNTER — Other Ambulatory Visit: Payer: Self-pay

## 2020-05-18 VITALS — BP 177/89 | HR 75 | Temp 97.9°F | Resp 18 | Wt 150.0 lb

## 2020-05-18 DIAGNOSIS — D631 Anemia in chronic kidney disease: Secondary | ICD-10-CM

## 2020-05-18 DIAGNOSIS — K9041 Non-celiac gluten sensitivity: Secondary | ICD-10-CM

## 2020-05-18 DIAGNOSIS — N1831 Chronic kidney disease, stage 3a: Secondary | ICD-10-CM

## 2020-05-18 DIAGNOSIS — N189 Chronic kidney disease, unspecified: Secondary | ICD-10-CM | POA: Insufficient documentation

## 2020-05-18 DIAGNOSIS — D501 Sideropenic dysphagia: Secondary | ICD-10-CM | POA: Diagnosis not present

## 2020-05-18 LAB — CBC WITH DIFFERENTIAL (CANCER CENTER ONLY)
Abs Immature Granulocytes: 0.06 10*3/uL (ref 0.00–0.07)
Basophils Absolute: 0.1 10*3/uL (ref 0.0–0.1)
Basophils Relative: 1 %
Eosinophils Absolute: 0.1 10*3/uL (ref 0.0–0.5)
Eosinophils Relative: 1 %
HCT: 32.8 % — ABNORMAL LOW (ref 36.0–46.0)
Hemoglobin: 10.4 g/dL — ABNORMAL LOW (ref 12.0–15.0)
Immature Granulocytes: 1 %
Lymphocytes Relative: 15 %
Lymphs Abs: 1.5 10*3/uL (ref 0.7–4.0)
MCH: 29.1 pg (ref 26.0–34.0)
MCHC: 31.7 g/dL (ref 30.0–36.0)
MCV: 91.9 fL (ref 80.0–100.0)
Monocytes Absolute: 0.7 10*3/uL (ref 0.1–1.0)
Monocytes Relative: 7 %
Neutro Abs: 8 10*3/uL — ABNORMAL HIGH (ref 1.7–7.7)
Neutrophils Relative %: 75 %
Platelet Count: 335 10*3/uL (ref 150–400)
RBC: 3.57 MIL/uL — ABNORMAL LOW (ref 3.87–5.11)
RDW: 14.6 % (ref 11.5–15.5)
WBC Count: 10.4 10*3/uL (ref 4.0–10.5)
nRBC: 0 % (ref 0.0–0.2)

## 2020-05-18 LAB — CMP (CANCER CENTER ONLY)
ALT: 11 U/L (ref 0–44)
AST: 16 U/L (ref 15–41)
Albumin: 3.9 g/dL (ref 3.5–5.0)
Alkaline Phosphatase: 47 U/L (ref 38–126)
Anion gap: 11 (ref 5–15)
BUN: 27 mg/dL — ABNORMAL HIGH (ref 8–23)
CO2: 27 mmol/L (ref 22–32)
Calcium: 10.4 mg/dL — ABNORMAL HIGH (ref 8.9–10.3)
Chloride: 98 mmol/L (ref 98–111)
Creatinine: 1.17 mg/dL — ABNORMAL HIGH (ref 0.44–1.00)
GFR, Estimated: 52 mL/min — ABNORMAL LOW (ref >60)
Glucose, Bld: 179 mg/dL — ABNORMAL HIGH (ref 70–99)
Potassium: 5.1 mmol/L (ref 3.5–5.1)
Sodium: 136 mmol/L (ref 135–145)
Total Bilirubin: 0.4 mg/dL (ref 0.3–1.2)
Total Protein: 7.3 g/dL (ref 6.5–8.1)

## 2020-05-18 LAB — RETICULOCYTES
Immature Retic Fract: 13.1 % (ref 2.3–15.9)
RBC.: 3.55 MIL/uL — ABNORMAL LOW (ref 3.87–5.11)
Retic Count, Absolute: 45.8 10*3/uL (ref 19.0–186.0)
Retic Ct Pct: 1.3 % (ref 0.4–3.1)

## 2020-05-18 LAB — IRON AND TIBC
Iron: 44 ug/dL (ref 41–142)
Saturation Ratios: 16 % — ABNORMAL LOW (ref 21–57)
TIBC: 265 ug/dL (ref 236–444)
UIBC: 222 ug/dL (ref 120–384)

## 2020-05-18 LAB — LACTATE DEHYDROGENASE: LDH: 144 U/L (ref 98–192)

## 2020-05-18 LAB — FERRITIN: Ferritin: 559 ng/mL — ABNORMAL HIGH (ref 11–307)

## 2020-05-18 MED ORDER — DARBEPOETIN ALFA 300 MCG/0.6ML IJ SOSY
PREFILLED_SYRINGE | INTRAMUSCULAR | Status: AC
  Filled 2020-05-18: qty 0.6

## 2020-05-18 MED ORDER — OXYCODONE HCL 5 MG PO TABS: 5.0000 mg | ORAL_TABLET | Freq: Three times a day (TID) | ORAL | 0 refills | Status: DC | PRN

## 2020-05-18 MED ORDER — DARBEPOETIN ALFA 300 MCG/0.6ML IJ SOSY
300.0000 ug | PREFILLED_SYRINGE | Freq: Once | INTRAMUSCULAR | Status: AC
  Administered 2020-05-18: 300 ug via SUBCUTANEOUS

## 2020-05-18 NOTE — Telephone Encounter (Signed)
Called pt with 06/07/20 f/u appt as she left prior to me speaking with her.  She feels the times will be ok and will c/b if needed......[name] AOM

## 2020-05-18 NOTE — Telephone Encounter (Signed)
Patient called requesting a refill of oxycodone. Please send to pharmacy on file. Patient is asking for a phone call when medications is sent in. Patient phone number is [phone].

## 2020-05-18 NOTE — Telephone Encounter (Signed)
See below. Please advise. Thanks.  

## 2020-05-18 NOTE — Patient Instructions (Signed)
Darbepoetin Alfa injection What is this medicine? DARBEPOETIN ALFA (dar be POE e tin AL fa) helps your body make more red blood cells. It is used to treat anemia caused by chronic kidney failure and chemotherapy. This medicine may be used for other purposes; ask your health care provider or pharmacist if you have questions. COMMON BRAND NAME(S): Aranesp What should I tell my health care provider before I take this medicine? They need to know if you have any of these conditions:  blood clotting disorders or history of blood clots  cancer patient not on chemotherapy  cystic fibrosis  heart disease, such as angina, heart failure, or a history of a heart attack  hemoglobin level of 12 g/dL or greater  high blood pressure  low levels of folate, iron, or vitamin B12  seizures  an unusual or allergic reaction to darbepoetin, erythropoietin, albumin, hamster proteins, latex, other medicines, foods, dyes, or preservatives  pregnant or trying to get pregnant  breast-feeding How should I use this medicine? This medicine is for injection into a vein or under the skin. It is usually given by a health care professional in a hospital or clinic setting. If you get this medicine at home, you will be taught how to prepare and give this medicine. Use exactly as directed. Take your medicine at regular intervals. Do not take your medicine more often than directed. It is important that you put your used needles and syringes in a special sharps container. Do not put them in a trash can. If you do not have a sharps container, call your pharmacist or healthcare provider to get one. A special MedGuide will be given to you by the pharmacist with each prescription and refill. Be sure to read this information carefully each time. Talk to your pediatrician regarding the use of this medicine in children. While this medicine may be used in children as young as 1 month of age for selected conditions, precautions do  apply. Overdosage: If you think you have taken too much of this medicine contact a poison control center or emergency room at once. NOTE: This medicine is only for you. Do not share this medicine with others. What if I miss a dose? If you miss a dose, take it as soon as you can. If it is almost time for your next dose, take only that dose. Do not take double or extra doses. What may interact with this medicine? Do not take this medicine with any of the following medications:  epoetin alfa This list may not describe all possible interactions. Give your health care provider a list of all the medicines, herbs, non-prescription drugs, or dietary supplements you use. Also tell them if you smoke, drink alcohol, or use illegal drugs. Some items may interact with your medicine. What should I watch for while using this medicine? Your condition will be monitored carefully while you are receiving this medicine. You may need blood work done while you are taking this medicine. This medicine may cause a decrease in vitamin B6. You should make sure that you get enough vitamin B6 while you are taking this medicine. Discuss the foods you eat and the vitamins you take with your health care professional. What side effects may I notice from receiving this medicine? Side effects that you should report to your doctor or health care professional as soon as possible:  allergic reactions like skin rash, itching or hives, swelling of the face, lips, or tongue  breathing problems  changes in   vision  chest pain  confusion, trouble speaking or understanding  feeling faint or lightheaded, falls  high blood pressure  muscle aches or pains  pain, swelling, warmth in the leg  rapid weight gain  severe headaches  sudden numbness or weakness of the face, arm or leg  trouble walking, dizziness, loss of balance or coordination  seizures (convulsions)  swelling of the ankles, feet, hands  unusually weak or  tired Side effects that usually do not require medical attention (report to your doctor or health care professional if they continue or are bothersome):  diarrhea  fever, chills (flu-like symptoms)  headaches  nausea, vomiting  redness, stinging, or swelling at site where injected This list may not describe all possible side effects. Call your doctor for medical advice about side effects. You may report side effects to FDA at 1-800-FDA-1088. Where should I keep my medicine? Keep out of the reach of children. Store in a refrigerator between 2 and 8 degrees C (36 and 46 degrees F). Do not freeze. Do not shake. Throw away any unused portion if using a single-dose vial. Throw away any unused medicine after the expiration date. NOTE: This sheet is a summary. It may not cover all possible information. If you have questions about this medicine, talk to your doctor, pharmacist, or health care provider.  2020 Elsevier/Gold Standard (2017-06-27 16:44:20)  

## 2020-05-18 NOTE — Telephone Encounter (Signed)
appts made and printed for pt per 05/18/20 los.... AOM 

## 2020-05-18 NOTE — Progress Notes (Signed)
Hematology and Oncology Follow Up Visit  Kelly Soto [id number] 02/25/1956 64 y.o. 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  The big news is that she had a surgery for her right knee.  This was an open surgery.  She had a partial replacement.  This was 2 weeks ago.  She is doing some physical therapy at home.  She actually looks pretty good.  She is getting around with a rolling walker.  She has had no problems with bleeding from the procedure.  There is been no issues with nausea or vomiting.  She has had no problems with her blood sugars.  They were on the high side after surgery which is no surprise.  Unfortunately, she cannot walk her dog right now.  Her husband is doing all this.  She will have a very nice and quiet Thanksgiving at home.  In September when we checked her iron studies, her ferritin was 523 with an iron saturation of 30%.  Overall, her performance status is ECOG 1.     Medications:  Current Outpatient Medications:  .  Ascorbic Acid (VITAMIN C) 1000 MG tablet, Take 1,000 mg by mouth daily., Disp: , Rfl:  .  BD PEN NEEDLE NANO U/F 32G X 4 MM MISC, USE 1 ONCE DAILY WITH TRESIBA, Disp: , Rfl:  .  betamethasone valerate (VALISONE) 0.1 % cream, Apply 1 application topically daily as needed (irritation)., Disp: , Rfl:  .  Blood Glucose Monitoring Suppl (CONTOUR NEXT EZ) w/Device KIT, USE TO CHECK BLOOD SUGARS DAILY, Disp: , Rfl:  .  CONTOUR NEXT TEST test strip, USE STRIP TO CHECK GLUCOSE ONCE DAILY, Disp: , Rfl:  .  denosumab (PROLIA) 60 MG/ML SOLN injection, Inject 60 mg into the skin every 6 (six) months.  Administer in upper arm, thigh, or abdomen  Next injection is due 05-2016, Disp: , Rfl:  .  diazepam (VALIUM) 5 MG tablet, Take 5 mg by mouth daily as needed for anxiety. , Disp: , Rfl: 0 .  fluticasone (FLONASE) 50 MCG/ACT nasal spray, Place 2 sprays into both nostrils at bedtime., Disp: , Rfl:  .  Galcanezumab-gnlm (EMGALITY) 120 MG/ML SOAJ, Inject 120 mg into the skin every 30 (thirty) days., Disp: , Rfl:  .  glyBURIDE-metformin (GLUCOVANCE) 2.5-500 MG per tablet, Take 2 tablets by mouth 2 (two) times daily., Disp: , Rfl:  .  hydroxypropyl methylcellulose / hypromellose (ISOPTO TEARS / GONIOVISC) 2.5 % ophthalmic solution, Place 1 drop into both eyes 3 (three) times daily as needed for dry eyes. , Disp: , Rfl:  .  methocarbamol (ROBAXIN) 500 MG tablet, Take 1 tablet (500 mg total) by mouth every 8 (eight) hours as needed for muscle spasms., Disp: 30 tablet, Rfl: 0 .  metoprolol succinate (TOPROL-XL) 25 MG 24 hr tablet, Take 25 mg by mouth every morning., Disp: , Rfl:  .  Microlet Lancets MISC, USE AS DIRECTED TO CHECK BLOOD SUGAR, Disp: , Rfl:  .  Multiple Vitamin (MULTI-VITAMINS) TABS, Take 1 tablet by mouth daily. , Disp: , Rfl:  .  omeprazole (PRILOSEC) 40 MG capsule, Take 40 mg by mouth 2 (two)  times daily., Disp: , Rfl:  .  ondansetron (ZOFRAN) 4 MG tablet, Take 1 tablet (4 mg total) by mouth every 6 (six) hours as needed for nausea., Disp: 30 tablet, Rfl: 0 .  oxyCODONE (OXY IR/ROXICODONE) 5 MG immediate release tablet, Take 1 tablet (5 mg total) by mouth every 6 (six) hours as needed for moderate pain (pain score 4-6)., Disp: 30 tablet, Rfl: 0 .  pioglitazone (ACTOS) 45 MG tablet, Take 45 mg by mouth daily., Disp: , Rfl:  .  rivaroxaban (XARELTO) 10 MG TABS tablet, Take 1 tablet (10 mg total) by mouth daily with breakfast., Disp: 30 tablet, Rfl: 0 .  Simethicone (GAS FREE EXTRA STRENGTH PO), Take 2 tablets by mouth at bedtime., Disp: , Rfl:  .  tobramycin (TOBREX) 0.3 % ophthalmic solution,  Place 1 drop into both eyes See admin instructions. Instill one drop into the affected eye four times a day prior and the day after eye injection., Disp: , Rfl:  .  TRESIBA FLEXTOUCH 100 UNIT/ML SOPN FlexTouch Pen, Inject 12 Units into the skin daily. , Disp: , Rfl:   Allergies:  Allergies  Allergen Reactions  . Ace Inhibitors Other (See Comments) and Anaphylaxis    Angioedema  . Aspirin Other (See Comments)    Cannot take due to gastric bypass surgery  . Nsaids Anaphylaxis and Other (See Comments)    S/p gastric bypass Cannot take orally due to gastric bypass S/p gastric bypass  . Sular [Nisoldipine Er] Other (See Comments)    angioedema  . Boniva [Ibandronic Acid] Other (See Comments)    Joint pain    Past Medical History, Surgical history, Social history, and Family History were reviewed and updated.  Review of Systems: Review of Systems  Constitutional: Negative.   HENT: Negative.   Eyes: Negative.   Respiratory: Negative.   Cardiovascular: Negative.   Gastrointestinal: Negative.   Genitourinary: Negative.   Musculoskeletal: Negative.   Skin: Negative.   Neurological: Negative.   Endo/Heme/Allergies: Negative.   Psychiatric/Behavioral: Negative.     Physical Exam:  vitals were not taken for this visit.   Physical Exam Vitals reviewed.  HENT:     Head: Normocephalic and atraumatic.  Eyes:     Pupils: Pupils are equal, round, and reactive to light.  Cardiovascular:     Rate and Rhythm: Normal rate and regular rhythm.     Heart sounds: Normal heart sounds.  Pulmonary:     Effort: Pulmonary effort is normal.     Breath sounds: Normal breath sounds.  Abdominal:     General: Bowel sounds are normal.     Palpations: Abdomen is soft.  Musculoskeletal:        General: No tenderness or deformity. Normal range of motion.     Cervical back: Normal range of motion.  Lymphadenopathy:     Cervical: No cervical adenopathy.  Skin:    General: Skin is warm and dry.      Findings: No erythema or rash.  Neurological:     Mental Status: She is alert and oriented to person, place, and time.  Psychiatric:        Behavior: Behavior normal.        Thought Content: Thought content normal.        Judgment: Judgment normal.      Lab Results  Component Value Date   WBC 10.4 05/18/2020   HGB 10.4 (L) 05/18/2020   HCT 32.8 (L) 05/18/2020   MCV 91.9 05/18/2020   PLT  335 05/18/2020     Chemistry      Component Value Date/Time   NA 138 04/27/2020 0837   NA 145 05/10/2017 0748   NA 137 12/06/2016 0746   K 5.6 (H) 04/27/2020 0837   K 4.7 05/10/2017 0748   K 5.2 (H) 12/06/2016 0746   CL 102 04/27/2020 0837   CL 110 (H) 05/10/2017 0748   CO2 27 04/27/2020 0837   CO2 23 05/10/2017 0748   CO2 23 12/06/2016 0746   BUN 32 (H) 04/27/2020 0837   BUN 28 (H) 05/10/2017 0748   BUN 26.1 (H) 12/06/2016 0746   CREATININE 1.16 (H) 04/27/2020 0837   CREATININE 1.12 (H) 03/16/2020 0741   CREATININE 1.2 05/10/2017 0748   CREATININE 1.2 (H) 12/06/2016 0746      Component Value Date/Time   CALCIUM 10.0 04/27/2020 0837   CALCIUM 9.1 05/10/2017 0748   CALCIUM 9.1 12/06/2016 0746   ALKPHOS 51 03/16/2020 0741   ALKPHOS 61 05/10/2017 0748   ALKPHOS 41 12/06/2016 0746   AST 21 03/16/2020 0741   AST 23 12/06/2016 0746   ALT 28 03/16/2020 0741   ALT 31 05/10/2017 0748   ALT 26 12/06/2016 0746   BILITOT 0.2 (L) 03/16/2020 0741   BILITOT 0.26 12/06/2016 0746       Impression and Plan: [name] is 64 year old white female. She has diabetes. She has iron deficiency anemia. She has a very low erythropoietin level.  We will go ahead and give her Retacrit today.  I hate that we have to give her Retacrit and not Aranesp.  We will probably have to get her back before Christmas now.  I want to see her back in 3 weeks.  I want to make sure that her blood does not drop because of the surgery and stress.  I know that she will will work very hard with her physical therapy  to help with her mobility.  Volanda Napoleon, MD 11/23/20218:06 AM

## 2020-05-18 NOTE — Progress Notes (Signed)
Pt discharged in no apparent distress. Pt left ambulatory with walker. Pt aware of discharge instructions and verbalized understanding and had no further questions. 

## 2020-05-18 NOTE — Telephone Encounter (Signed)
Sent in and called her

## 2020-05-19 ENCOUNTER — Telehealth: Payer: Self-pay | Admitting: Orthopedic Surgery

## 2020-05-19 ENCOUNTER — Other Ambulatory Visit: Payer: Self-pay | Admitting: Surgical

## 2020-05-19 MED ORDER — OXYCODONE HCL 5 MG PO TABS: 5.0000 mg | ORAL_TABLET | Freq: Three times a day (TID) | ORAL | 0 refills | Status: DC | PRN

## 2020-05-19 NOTE — Telephone Encounter (Signed)
Called her

## 2020-05-19 NOTE — Telephone Encounter (Signed)
Patient called and stated her pharmacy called and are out of her oxycodone and to have Dr. Marlou Sa or PA Lurena Joiner send to another pharmacy. Patient is asking oxycodone be sent to [facility] on [address]. Please call patient at [phone].

## 2020-05-19 NOTE — Telephone Encounter (Signed)
See below

## 2020-05-20 ENCOUNTER — Other Ambulatory Visit: Payer: Self-pay | Admitting: Family

## 2020-05-20 NOTE — Discharge Summary (Signed)
Physician Discharge Summary      Patient ID: Kelly Soto MRN: [id number] DOB/AGE: 65-Jun-1957 65 y.o.  Admit date: 05/04/2020 Discharge date: 05/07/20  Admission Diagnoses:  Active Problems:   S/P right unicompartmental knee replacement   Discharge Diagnoses:  Same  Surgeries: Procedure(s): right partial knee replacement on 05/04/2020   Consultants:   Discharged Condition: Stable  Hospital Course: Kelly Soto is an 64 y.o. female who was admitted 05/04/2020 with a chief complaint of right knee pain, and found to have a diagnosis of right knee OA.  They were brought to the operating room on 05/04/2020 and underwent the above named procedures.  Pt awoke from anesthesia without complication and was transferred to the floor. On POD1, patient's pain was controlled but she had severe nausea that was uncontrolled with zofran and required phenergan.  This improved by POD3 and she was mobilizing well with PT by that point so she was discharged home.  Pt will f/u with Dr. Marlou Sa in clinic in ~2 weeks.   Antibiotics given:  Anti-infectives (From admission, onward)   Start     Dose/Rate Route Frequency Ordered Stop   05/04/20 1930  ceFAZolin (ANCEF) IVPB 2g/100 mL premix        2 g 200 mL/hr over 30 Minutes Intravenous Every 8 hours 05/04/20 1653 05/05/20 0619   05/04/20 1355  vancomycin (VANCOCIN) powder  Status:  Discontinued          As needed 05/04/20 1355 05/04/20 1450   05/04/20 1000  ceFAZolin (ANCEF) IVPB 2g/100 mL premix        2 g 200 mL/hr over 30 Minutes Intravenous On call to O.R. 05/04/20 9480 05/04/20 1135    .  Recent vital signs:  Vitals:   05/07/20 0255 05/07/20 0838  BP: (!) 158/82 (!) 162/81  Pulse: 92 87  Resp: 17 17  Temp: 97.9 F (36.6 C) 98 F (36.7 C)  SpO2: 94% 95%    Recent laboratory studies:  Results for orders placed or performed during the hospital encounter of 05/04/20  Glucose, capillary  Result Value Ref Range   Glucose-Capillary 120  (H) 70 - 99 mg/dL  Glucose, capillary  Result Value Ref Range   Glucose-Capillary 75 70 - 99 mg/dL  Glucose, capillary  Result Value Ref Range   Glucose-Capillary 114 (H) 70 - 99 mg/dL  Glucose, capillary  Result Value Ref Range   Glucose-Capillary 213 (H) 70 - 99 mg/dL  Glucose, capillary  Result Value Ref Range   Glucose-Capillary 249 (H) 70 - 99 mg/dL  Glucose, capillary  Result Value Ref Range   Glucose-Capillary 154 (H) 70 - 99 mg/dL  Glucose, capillary  Result Value Ref Range   Glucose-Capillary 172 (H) 70 - 99 mg/dL  Glucose, capillary  Result Value Ref Range   Glucose-Capillary 225 (H) 70 - 99 mg/dL  Glucose, capillary  Result Value Ref Range   Glucose-Capillary 166 (H) 70 - 99 mg/dL  Glucose, capillary  Result Value Ref Range   Glucose-Capillary 223 (H) 70 - 99 mg/dL    Discharge Medications:   Allergies as of 05/07/2020      Reactions   Ace Inhibitors Other (See Comments), Anaphylaxis   Angioedema   Aspirin Other (See Comments)   Cannot take due to gastric bypass surgery   Nsaids Anaphylaxis, Other (See Comments)   S/p gastric bypass Cannot take orally due to gastric bypass S/p gastric bypass   Sular [nisoldipine Er] Other (See Comments)   angioedema  Boniva [ibandronic Acid] Other (See Comments)   Joint pain      Medication List    STOP taking these medications   acetaminophen 500 MG chewable tablet Commonly known as: TYLENOL   baclofen 10 MG tablet Commonly known as: LIORESAL     TAKE these medications   BD Pen Needle Nano U/F 32G X 4 MM Misc Generic drug: Insulin Pen Needle USE 1 ONCE DAILY WITH TRESIBA   betamethasone valerate 0.1 % cream Commonly known as: VALISONE Apply 1 application topically daily as needed (irritation).   Contour Next EZ w/Device Kit USE TO CHECK BLOOD SUGARS DAILY   Contour Next Test test strip Generic drug: glucose blood USE STRIP TO CHECK GLUCOSE ONCE DAILY   denosumab 60 MG/ML Soln injection Commonly  known as: PROLIA Inject 60 mg into the skin every 6 (six) months. Administer in upper arm, thigh, or abdomen  Next injection is due 05-2016   diazepam 5 MG tablet Commonly known as: VALIUM Take 5 mg by mouth daily as needed for anxiety.   Emgality 120 MG/ML Soaj Generic drug: Galcanezumab-gnlm Inject 120 mg into the skin every 30 (thirty) days.   fluticasone 50 MCG/ACT nasal spray Commonly known as: FLONASE Place 2 sprays into both nostrils at bedtime.   GAS FREE EXTRA STRENGTH PO Take 2 tablets by mouth at bedtime.   glyBURIDE-metformin 2.5-500 MG tablet Commonly known as: GLUCOVANCE Take 2 tablets by mouth 2 (two) times daily.   hydroxypropyl methylcellulose / hypromellose 2.5 % ophthalmic solution Commonly known as: ISOPTO TEARS / GONIOVISC Place 1 drop into both eyes 3 (three) times daily as needed for dry eyes.   metoprolol succinate 25 MG 24 hr tablet Commonly known as: TOPROL-XL Take 25 mg by mouth every morning.   Microlet Lancets Misc USE AS DIRECTED TO CHECK BLOOD SUGAR   Multi-Vitamins Tabs Take 1 tablet by mouth daily.   omeprazole 40 MG capsule Commonly known as: PRILOSEC Take 40 mg by mouth 2 (two) times daily.   ondansetron 4 MG tablet Commonly known as: ZOFRAN Take 1 tablet (4 mg total) by mouth every 6 (six) hours as needed for nausea. Notes to patient: Last dose given 11/11 06:29am   pioglitazone 45 MG tablet Commonly known as: ACTOS Take 45 mg by mouth daily. What changed: Another medication with the same name was removed. Continue taking this medication, and follow the directions you see here.   rivaroxaban 10 MG Tabs tablet Commonly known as: XARELTO Take 1 tablet (10 mg total) by mouth daily with breakfast.   tobramycin 0.3 % ophthalmic solution Commonly known as: TOBREX Place 1 drop into both eyes See admin instructions. Instill one drop into the affected eye four times a day prior and the day after eye injection.   Tyler Aas FlexTouch  100 UNIT/ML FlexTouch Pen Generic drug: insulin degludec Inject 12 Units into the skin daily.   vitamin C 1000 MG tablet Take 1,000 mg by mouth daily.       Diagnostic Studies: XR Knee 1-2 Views Right  Result Date: 05/12/2020 AP lateral right knee reviewed.  Unicompartmental knee replacement in good position alignment with no complicating features.  Lateral joint space well preserved.   Disposition: Discharge disposition: 01-Home or Self Care       Discharge Instructions    Call MD / Call 911   Complete by: As directed    If you experience chest pain or shortness of breath, CALL 911 and be transported to the hospital emergency  room.  If you develope a fever above 101 F, pus (white drainage) or increased drainage or redness at the wound, or calf pain, call your surgeon's office.   Constipation Prevention   Complete by: As directed    Drink plenty of fluids.  Prune juice may be helpful.  You may use a stool softener, such as Colace (over the counter) 100 mg twice a day.  Use MiraLax (over the counter) for constipation as needed.   Diet - low sodium heart healthy   Complete by: As directed    Discharge instructions   Complete by: As directed    You may shower, dressing is waterproof.  Do not remove the dressing, we will remove it at your first post-op appointment.  Do not take a bath or soak the knee in a tub or pool.  You may weightbear as you can tolerate on the operative leg with a walker.  Continue using the CPM machine 3 times per day for one hour each time, increasing the degrees of range of motion daily.  Use the blue cradle boot under your heel to work on getting your leg straight.  Do NOT put a pillow under your knee.  You will follow-up with Dr. Marlou Sa in the clinic in 2 weeks at your given appointment date.  If Xarelto is very expensive, there are discounts available through the company, call the office if this is the case.  Dental Antibiotics:  In most cases prophylactic  antibiotics for Dental procdeures after total joint surgery are not necessary.  Exceptions are as follows:  1. History of prior total joint infection  2. Severely immunocompromised (Organ Transplant, cancer chemotherapy, Rheumatoid biologic meds such as [place])  3. Poorly controlled diabetes (A1C &gt; 8.0, blood glucose over 200)  If you have one of these conditions, contact your surgeon for an antibiotic prescription, prior to your dental procedure.   Increase activity slowly as tolerated   Complete by: As directed        Follow-up Information    [facility] Follow up.   Specialty: Home Health Services Why: Alvis Lemmings will be providing you with home health physical therapy.  Alvis Lemmings will call you in the next 24-48 hours after your discharge home to set up therapy times. Contact information: [address] [place] [address] [phone]        [name], MD. Schedule an appointment as soon as possible for a visit.   Specialty: Family Medicine Why: Please call your primary care physician and follow up in the next 7-10 days for a hopsital follow up. Contact information: [address] [place] [address] [phone]                Signed: [name] Stade 05/20/2020, 11:49 AM

## 2020-05-26 ENCOUNTER — Ambulatory Visit (INDEPENDENT_AMBULATORY_CARE_PROVIDER_SITE_OTHER): Payer: Medicare Other | Admitting: Orthopedic Surgery

## 2020-05-26 ENCOUNTER — Other Ambulatory Visit: Payer: Self-pay

## 2020-05-26 DIAGNOSIS — Z96659 Presence of unspecified artificial knee joint: Secondary | ICD-10-CM

## 2020-05-26 MED ORDER — METHOCARBAMOL 500 MG PO TABS: 500.0000 mg | ORAL_TABLET | Freq: Three times a day (TID) | ORAL | 0 refills | Status: DC | PRN

## 2020-05-26 MED ORDER — METHOCARBAMOL 500 MG PO TABS
500.0000 mg | ORAL_TABLET | Freq: Three times a day (TID) | ORAL | 0 refills | Status: DC | PRN
Start: 2020-05-26 — End: 2020-05-26

## 2020-05-28 ENCOUNTER — Other Ambulatory Visit: Payer: Self-pay

## 2020-05-28 ENCOUNTER — Inpatient Hospital Stay: Payer: Medicare Other | Attending: Hematology & Oncology

## 2020-05-28 VITALS — BP 162/68 | HR 69 | Temp 98.7°F | Resp 17

## 2020-05-28 DIAGNOSIS — Z794 Long term (current) use of insulin: Secondary | ICD-10-CM | POA: Insufficient documentation

## 2020-05-28 DIAGNOSIS — D509 Iron deficiency anemia, unspecified: Secondary | ICD-10-CM | POA: Insufficient documentation

## 2020-05-28 DIAGNOSIS — E119 Type 2 diabetes mellitus without complications: Secondary | ICD-10-CM | POA: Insufficient documentation

## 2020-05-28 DIAGNOSIS — N189 Chronic kidney disease, unspecified: Secondary | ICD-10-CM

## 2020-05-28 DIAGNOSIS — D631 Anemia in chronic kidney disease: Secondary | ICD-10-CM

## 2020-05-28 MED ORDER — SODIUM CHLORIDE 0.9 % IV SOLN
510.0000 mg | Freq: Once | INTRAVENOUS | Status: AC
  Administered 2020-05-28: 510 mg via INTRAVENOUS
  Filled 2020-05-28: qty 17

## 2020-05-28 MED ORDER — SODIUM CHLORIDE 0.9 % IV SOLN
Freq: Once | INTRAVENOUS | Status: AC
  Filled 2020-05-28: qty 250

## 2020-05-28 NOTE — Progress Notes (Signed)
Pt discharged in no apparent distress. Pt left ambulatory without assistance. Pt aware of discharge instructions and verbalized understanding and had no further questions.  

## 2020-05-30 ENCOUNTER — Encounter: Payer: Self-pay | Admitting: Orthopedic Surgery

## 2020-05-30 NOTE — Progress Notes (Signed)
Post-Op Visit Note   Patient: Kelly Soto           Date of Birth: Oct 03, 1955           MRN: [id number] Visit Date: 05/26/2020 PCP: [name], MD   Assessment & Plan:  Chief Complaint:  Chief Complaint  Patient presents with  . Right Knee - Routine Post Op   Visit Diagnoses:  1. History of prosthetic unicompartmental arthroplasty of knee     Plan: Patient is a 64 year old female presents s/p right knee unicompartmental knee arthroplasty on 05/04/2020.  She states that she is progressing well.  She is ambulating with a cane.  She started outpatient physical therapy on Monday.  0 degrees extension with 85 degrees of knee flexion.  No calf tenderness on exam.  Negative Homans' sign.  She is still taking Xarelto.  Incision is healing well.  Plan to obtain ultrasound to rule out deep vein thrombosis of the right lower extremity on Monday.  May discontinue Xarelto if ultrasound is negative.  Overall patient is progressing well with physical therapy.  Plan to follow-up in 4 weeks for clinical recheck.  Follow-Up Instructions: No follow-ups on file.   Orders:  Orders Placed This Encounter  Procedures  . VAS [place] LOWER EXTREMITY VENOUS (DVT)   Meds ordered this encounter  Medications  . DISCONTD: methocarbamol (ROBAXIN) 500 MG tablet    Sig: Take 1 tablet (500 mg total) by mouth every 8 (eight) hours as needed for muscle spasms.    Dispense:  30 tablet    Refill:  0  . methocarbamol (ROBAXIN) 500 MG tablet    Sig: Take 1 tablet (500 mg total) by mouth every 8 (eight) hours as needed for muscle spasms.    Dispense:  30 tablet    Refill:  0    Imaging: No results found.  PMFS History: Patient Active Problem List   Diagnosis Date Noted  . 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 Chronic renal insufficiency 09/24/2013  . Anemia of renal disease 09/24/2013  . H/O gastric bypass 08/22/2013  . Intestinal malabsorption 08/22/2013  . Anemia, iron  deficiency 08/22/2013  . Chronic eustachian tube dysfunction 03/28/2011  . Diabetes mellitus ([place]) 03/28/2011   Past Medical History:  Diagnosis Date  . Anemia of renal disease 09/24/2013  . Anxiety   . Arthritis   . Chronic renal insufficiency 09/24/2013  . Diabetes mellitus without complication ([place])    Type II  . Gastroparesis   . GERD (gastroesophageal reflux disease)   . Headache    migrains  . History of hiatal hernia   . Hypertension   . PONV (postoperative nausea and vomiting)   . Sleep apnea    Has not used the Cpap since having gastric bypass surgery.  [name] Ulcer     Family History  Problem Relation Age of Onset  . Atrial fibrillation Mother   . Heart attack Father   . Sudden Cardiac Death Father   . Stroke Maternal Grandmother     Past Surgical History:  Procedure Laterality Date  . ABDOMINAL HYSTERECTOMY  1992  . BREAST BIOPSY Left 03/26/2017   benign  . CARDIAC CATHETERIZATION     68yrs ago- negative  . CARPAL TUNNEL RELEASE Bilateral   . CHOLECYSTECTOMY N/A 05/11/2016   Procedure: LAPAROSCOPIC CHOLECYSTECTOMY;  Surgeon: Erroll Luna, MD;  Location: [facility];  Service: General;  Laterality: N/A;  . EYE SURGERY     Bilateral cataracts  . GASTRIC  BYPASS  2009  . PARTIAL KNEE ARTHROPLASTY Right 05/04/2020   Procedure: right partial knee replacement;  Surgeon: Meredith Pel, MD;  Location: [facility];  Service: Orthopedics;  Laterality: Right;   Social History   Occupational History  . Not on file  Tobacco Use  . Smoking status: Never Smoker  . Smokeless tobacco: Never Used  . Tobacco comment: never used tobacco  Vaping Use  . Vaping Use: Never used  Substance and Sexual Activity  . Alcohol use: No    Alcohol/week: 0.0 standard drinks  . Drug use: No  . Sexual activity: Yes

## 2020-05-31 ENCOUNTER — Ambulatory Visit (HOSPITAL_COMMUNITY)
Admission: RE | Admit: 2020-05-31 | Discharge: 2020-05-31 | Disposition: A | Discharge status: Home / Self Care | Payer: Medicare Other | Source: Ambulatory Visit | Attending: Surgical | Admitting: Surgical

## 2020-05-31 ENCOUNTER — Other Ambulatory Visit: Payer: Self-pay

## 2020-05-31 ENCOUNTER — Telehealth: Payer: Self-pay

## 2020-05-31 DIAGNOSIS — Z96659 Presence of unspecified artificial knee joint: Secondary | ICD-10-CM | POA: Diagnosis not present

## 2020-05-31 NOTE — Telephone Encounter (Signed)
I gave Lurena Joiner report. He states patient ok to stop Xarelto.   I called and left voicemail for patient advising. I will call her back tomorrow to be sure she received message.

## 2020-05-31 NOTE — Telephone Encounter (Signed)
FYI- Please see results for DVT, Right LE in patient's chart.

## 2020-05-31 NOTE — Progress Notes (Signed)
Right lower extremity venous study completed.   LVM for referring provider regarding preliminary results.   Please see CV Proc for preliminary results.   Vonzell Schlatter, RVT

## 2020-06-01 NOTE — Telephone Encounter (Signed)
I called patient and advised. 

## 2020-06-07 ENCOUNTER — Other Ambulatory Visit: Payer: Self-pay

## 2020-06-07 ENCOUNTER — Telehealth: Payer: Self-pay | Admitting: Hematology & Oncology

## 2020-06-07 ENCOUNTER — Telehealth: Payer: Self-pay | Admitting: *Deleted

## 2020-06-07 ENCOUNTER — Inpatient Hospital Stay: Payer: Medicare Other

## 2020-06-07 ENCOUNTER — Encounter: Payer: Self-pay | Admitting: Hematology & Oncology

## 2020-06-07 ENCOUNTER — Inpatient Hospital Stay (HOSPITAL_BASED_OUTPATIENT_CLINIC_OR_DEPARTMENT_OTHER): Payer: Medicare Other | Admitting: Hematology & Oncology

## 2020-06-07 VITALS — BP 185/72 | HR 82 | Temp 98.7°F | Resp 18 | Wt 150.5 lb

## 2020-06-07 DIAGNOSIS — N189 Chronic kidney disease, unspecified: Secondary | ICD-10-CM

## 2020-06-07 DIAGNOSIS — D501 Sideropenic dysphagia: Secondary | ICD-10-CM

## 2020-06-07 DIAGNOSIS — D509 Iron deficiency anemia, unspecified: Secondary | ICD-10-CM | POA: Diagnosis not present

## 2020-06-07 DIAGNOSIS — N1831 Chronic kidney disease, stage 3a: Secondary | ICD-10-CM

## 2020-06-07 DIAGNOSIS — D631 Anemia in chronic kidney disease: Secondary | ICD-10-CM

## 2020-06-07 LAB — CBC WITH DIFFERENTIAL (CANCER CENTER ONLY)
Abs Immature Granulocytes: 0.04 10*3/uL (ref 0.00–0.07)
Basophils Absolute: 0 10*3/uL (ref 0.0–0.1)
Basophils Relative: 0 %
Eosinophils Absolute: 0 10*3/uL (ref 0.0–0.5)
Eosinophils Relative: 0 %
HCT: 39.4 % (ref 36.0–46.0)
Hemoglobin: 11.9 g/dL — ABNORMAL LOW (ref 12.0–15.0)
Immature Granulocytes: 0 %
Lymphocytes Relative: 11 %
Lymphs Abs: 1.1 10*3/uL (ref 0.7–4.0)
MCH: 29 pg (ref 26.0–34.0)
MCHC: 30.2 g/dL (ref 30.0–36.0)
MCV: 96.1 fL (ref 80.0–100.0)
Monocytes Absolute: 0.6 10*3/uL (ref 0.1–1.0)
Monocytes Relative: 6 %
Neutro Abs: 8.2 10*3/uL — ABNORMAL HIGH (ref 1.7–7.7)
Neutrophils Relative %: 83 %
Platelet Count: 251 10*3/uL (ref 150–400)
RBC: 4.1 MIL/uL (ref 3.87–5.11)
RDW: 16.1 % — ABNORMAL HIGH (ref 11.5–15.5)
WBC Count: 10.1 10*3/uL (ref 4.0–10.5)
nRBC: 0 % (ref 0.0–0.2)

## 2020-06-07 LAB — CMP (CANCER CENTER ONLY)
ALT: 12 U/L (ref 0–44)
AST: 15 U/L (ref 15–41)
Albumin: 3.9 g/dL (ref 3.5–5.0)
Alkaline Phosphatase: 47 U/L (ref 38–126)
Anion gap: 10 (ref 5–15)
BUN: 33 mg/dL — ABNORMAL HIGH (ref 8–23)
CO2: 27 mmol/L (ref 22–32)
Calcium: 9.9 mg/dL (ref 8.9–10.3)
Chloride: 103 mmol/L (ref 98–111)
Creatinine: 1.27 mg/dL — ABNORMAL HIGH (ref 0.44–1.00)
GFR, Estimated: 47 mL/min — ABNORMAL LOW (ref >60)
Glucose, Bld: 174 mg/dL — ABNORMAL HIGH (ref 70–99)
Potassium: 5.2 mmol/L — ABNORMAL HIGH (ref 3.5–5.1)
Sodium: 140 mmol/L (ref 135–145)
Total Bilirubin: 0.3 mg/dL (ref 0.3–1.2)
Total Protein: 6.6 g/dL (ref 6.5–8.1)

## 2020-06-07 LAB — RETICULOCYTES
Immature Retic Fract: 8 % (ref 2.3–15.9)
RBC.: 4.04 MIL/uL (ref 3.87–5.11)
Retic Count, Absolute: 57.4 10*3/uL (ref 19.0–186.0)
Retic Ct Pct: 1.4 % (ref 0.4–3.1)

## 2020-06-07 LAB — FERRITIN: Ferritin: 835 ng/mL — ABNORMAL HIGH (ref 11–307)

## 2020-06-07 LAB — IRON AND TIBC
Iron: 34 ug/dL — ABNORMAL LOW (ref 41–142)
Saturation Ratios: 14 % — ABNORMAL LOW (ref 21–57)
TIBC: 244 ug/dL (ref 236–444)
UIBC: 209 ug/dL (ref 120–384)

## 2020-06-07 NOTE — Progress Notes (Signed)
Hematology and Oncology Follow Up Visit  Kelly Soto [id number] 05/16/1956 64 y.o. 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 is back for follow-up.  She is doing much better.  The right knee still causes her a lot of issues.  She had surgery for this about 6 weeks ago.  She is still having quite a bit of pain.  She is doing her best to try to get around.  She is using a cane right now.  She did respond to iron we will give it to her last time.  Her iron saturation was only 16%.  We went ahead and gave her a dose of IV iron.  Hemoglobin responded quite nicely.  She has had no issues with bleeding.  There is no problems with her diabetes.  Her blood sugars have always been on the higher side.  She has had no problems with fever.  There is no rashes.  She has had no change in bowel or bladder habits.  Overall, her performance status is ECOG 1.       Medications:  Current Outpatient Medications:  .  Ascorbic Acid (VITAMIN C) 1000 MG tablet, Take 1,000 mg by mouth daily., Disp: , Rfl:  .  BD PEN NEEDLE NANO U/F 32G X 4 MM MISC, USE 1 ONCE DAILY WITH TRESIBA, Disp: , Rfl:  .  betamethasone valerate (VALISONE) 0.1 % cream, Apply 1 application topically daily as needed (irritation)., Disp: , Rfl:  .  Blood Glucose Monitoring Suppl (CONTOUR NEXT EZ) w/Device KIT, USE TO CHECK BLOOD SUGARS DAILY, Disp: , Rfl:  .  CONTOUR NEXT TEST test strip, USE STRIP TO CHECK GLUCOSE ONCE DAILY, Disp: , Rfl:  .  denosumab (PROLIA) 60 MG/ML SOLN injection, Inject 60 mg into the skin every 6 (six) months. Administer in upper arm, thigh, or abdomen  Next injection is due 05-2016,  Disp: , Rfl:  .  diazepam (VALIUM) 5 MG tablet, Take 5 mg by mouth daily as needed for anxiety. , Disp: , Rfl: 0 .  fluconazole (DIFLUCAN) 150 MG tablet, Take 150 mg by mouth., Disp: , Rfl:  .  fluticasone (FLONASE) 50 MCG/ACT nasal spray, Place 2 sprays into both nostrils at bedtime., Disp: , Rfl:  .  Galcanezumab-gnlm (EMGALITY) 120 MG/ML SOAJ, Inject 120 mg into the skin every 30 (thirty) days., Disp: , Rfl:  .  glyBURIDE-metformin (GLUCOVANCE) 2.5-500 MG per tablet, Take 2 tablets by mouth 2 (two) times daily., Disp: , Rfl:  .  hydroxypropyl methylcellulose / hypromellose (ISOPTO TEARS / GONIOVISC) 2.5 % ophthalmic solution, Place 1 drop into both eyes 3 (three) times daily as needed for dry eyes. , Disp: , Rfl:  .  methocarbamol (ROBAXIN) 500 MG tablet, Take 1 tablet (500 mg total) by mouth every 8 (eight) hours as needed for muscle spasms., Disp: 30 tablet, Rfl: 0 .  metoprolol succinate (TOPROL-XL) 25 MG 24 hr tablet, Take 25 mg by mouth every morning., Disp: , Rfl:  .  Microlet Lancets MISC, USE AS DIRECTED TO CHECK BLOOD SUGAR, Disp: , Rfl:  .  Multiple Vitamin (MULTI-VITAMINS) TABS, Take 1 tablet by mouth daily. , Disp: ,  Rfl:  .  omeprazole (PRILOSEC) 40 MG capsule, Take 40 mg by mouth 2 (two) times daily., Disp: , Rfl:  .  ondansetron (ZOFRAN) 4 MG tablet, Take 1 tablet (4 mg total) by mouth every 6 (six) hours as needed for nausea., Disp: 30 tablet, Rfl: 0 .  oxyCODONE (OXY IR/ROXICODONE) 5 MG immediate release tablet, Take 1 tablet (5 mg total) by mouth every 8 (eight) hours as needed for moderate pain (pain score 4-6)., Disp: 30 tablet, Rfl: 0 .  pioglitazone (ACTOS) 45 MG tablet, Take 45 mg by mouth daily., Disp: , Rfl:  .  Simethicone (GAS FREE EXTRA STRENGTH PO), Take 2 tablets by mouth at bedtime., Disp: , Rfl:  .  tobramycin (TOBREX) 0.3 % ophthalmic solution, Place 1 drop into both eyes See admin instructions. Instill one drop into the affected eye four times a day prior and the  day after eye injection., Disp: , Rfl:  .  TRESIBA FLEXTOUCH 100 UNIT/ML SOPN FlexTouch Pen, Inject 12 Units into the skin daily. , Disp: , Rfl:  .  rivaroxaban (XARELTO) 10 MG TABS tablet, Take 1 tablet (10 mg total) by mouth daily with breakfast., Disp: 30 tablet, Rfl: 0  Allergies:  Allergies  Allergen Reactions  . Ace Inhibitors Other (See Comments) and Anaphylaxis    Angioedema  . Aspirin Other (See Comments)    Cannot take due to gastric bypass surgery  . Nsaids Anaphylaxis and Other (See Comments)    S/p gastric bypass Cannot take orally due to gastric bypass S/p gastric bypass  . Sular [Nisoldipine Er] Other (See Comments)    angioedema  . Boniva [Ibandronic Acid] Other (See Comments)    Joint pain    Past Medical History, Surgical history, Social history, and Family History were reviewed and updated.  Review of Systems: Review of Systems  Constitutional: Negative.   HENT: Negative.   Eyes: Negative.   Respiratory: Negative.   Cardiovascular: Negative.   Gastrointestinal: Negative.   Genitourinary: Negative.   Musculoskeletal: Negative.   Skin: Negative.   Neurological: Negative.   Endo/Heme/Allergies: Negative.   Psychiatric/Behavioral: Negative.     Physical Exam:  weight is 150 lb 8 oz (68.3 kg). Her oral temperature is 98.7 F (37.1 C). Her blood pressure is 185/72 (abnormal) and her pulse is 82. Her respiration is 18 and oxygen saturation is 97%.   Physical Exam Vitals reviewed.  HENT:     Head: Normocephalic and atraumatic.  Eyes:     Pupils: Pupils are equal, round, and reactive to light.  Cardiovascular:     Rate and Rhythm: Normal rate and regular rhythm.     Heart sounds: Normal heart sounds.  Pulmonary:     Effort: Pulmonary effort is normal.     Breath sounds: Normal breath sounds.  Abdominal:     General: Bowel sounds are normal.     Palpations: Abdomen is soft.  Musculoskeletal:        General: No tenderness or deformity. Normal range  of motion.     Cervical back: Normal range of motion.  Lymphadenopathy:     Cervical: No cervical adenopathy.  Skin:    General: Skin is warm and dry.     Findings: No erythema or rash.  Neurological:     Mental Status: She is alert and oriented to person, place, and time.  Psychiatric:        Behavior: Behavior normal.        Thought Content: Thought content normal.  Judgment: Judgment normal.      Lab Results  Component Value Date   WBC 10.1 06/07/2020   HGB 11.9 (L) 06/07/2020   HCT 39.4 06/07/2020   MCV 96.1 06/07/2020   PLT 251 06/07/2020     Chemistry      Component Value Date/Time   NA 140 06/07/2020 0757   NA 145 05/10/2017 0748   NA 137 12/06/2016 0746   K 5.2 (H) 06/07/2020 0757   K 4.7 05/10/2017 0748   K 5.2 (H) 12/06/2016 0746   CL 103 06/07/2020 0757   CL 110 (H) 05/10/2017 0748   CO2 27 06/07/2020 0757   CO2 23 05/10/2017 0748   CO2 23 12/06/2016 0746   BUN 33 (H) 06/07/2020 0757   BUN 28 (H) 05/10/2017 0748   BUN 26.1 (H) 12/06/2016 0746   CREATININE 1.27 (H) 06/07/2020 0757   CREATININE 1.2 05/10/2017 0748   CREATININE 1.2 (H) 12/06/2016 0746      Component Value Date/Time   CALCIUM 9.9 06/07/2020 0757   CALCIUM 9.1 05/10/2017 0748   CALCIUM 9.1 12/06/2016 0746   ALKPHOS 47 06/07/2020 0757   ALKPHOS 61 05/10/2017 0748   ALKPHOS 41 12/06/2016 0746   AST 15 06/07/2020 0757   AST 23 12/06/2016 0746   ALT 12 06/07/2020 0757   ALT 31 05/10/2017 0748   ALT 26 12/06/2016 0746   BILITOT 0.3 06/07/2020 0757   BILITOT 0.26 12/06/2016 0746       Impression and Plan: Ms. Nowak is 64 year old white female. She has diabetes. She has iron deficiency anemia. She has a very low erythropoietin level.  We do not have to give her anything today.  I am just happy that her hemoglobin responded nicely.  We will now get her through the holiday season.  I will plan to get her back in about 4 weeks or so.  Hopefully by then, the right knee will be  much better she will be more mobile.     Volanda Napoleon, MD 12/13/20218:42 AM

## 2020-06-07 NOTE — Telephone Encounter (Signed)
Appointments scheduled patient has My Chart access for appt info per 12/13 los

## 2020-06-07 NOTE — Telephone Encounter (Signed)
-----   Message from Volanda Napoleon, MD sent at 06/07/2020  2:56 PM EST ----- Call - the iron is actually low.  She needs another dose of IV iron before Christmas!!  Kelly Soto

## 2020-06-07 NOTE — Telephone Encounter (Signed)
As noted below by Dr. Marin Olp, I informed the patient that her iron level is still low. He would like for you to get another dose of IV iron before Christmas. She verbalized understanding.

## 2020-06-09 ENCOUNTER — Other Ambulatory Visit: Payer: Self-pay | Admitting: Surgical

## 2020-06-09 ENCOUNTER — Telehealth: Payer: Self-pay | Admitting: Orthopedic Surgery

## 2020-06-09 MED ORDER — OXYCODONE HCL 5 MG PO TABS: 5.0000 mg | ORAL_TABLET | Freq: Every day | ORAL | 0 refills | Status: DC | PRN

## 2020-06-09 NOTE — Telephone Encounter (Signed)
This is a GD

## 2020-06-09 NOTE — Telephone Encounter (Signed)
Sent in RX

## 2020-06-09 NOTE — Telephone Encounter (Signed)
IC patient  She is requesting rxrf on oxy. Please advise. thanks

## 2020-06-09 NOTE — Telephone Encounter (Signed)
Pt wanted a medication refill sent to [facility].

## 2020-06-10 ENCOUNTER — Inpatient Hospital Stay: Payer: Medicare Other

## 2020-06-10 ENCOUNTER — Other Ambulatory Visit: Payer: Self-pay

## 2020-06-10 VITALS — BP 189/77 | HR 67 | Temp 97.7°F

## 2020-06-10 DIAGNOSIS — D509 Iron deficiency anemia, unspecified: Secondary | ICD-10-CM | POA: Diagnosis not present

## 2020-06-10 DIAGNOSIS — N189 Chronic kidney disease, unspecified: Secondary | ICD-10-CM

## 2020-06-10 DIAGNOSIS — D631 Anemia in chronic kidney disease: Secondary | ICD-10-CM

## 2020-06-10 MED ORDER — SODIUM CHLORIDE 0.9 % IV SOLN
510.0000 mg | Freq: Once | INTRAVENOUS | Status: AC
  Administered 2020-06-10: 510 mg via INTRAVENOUS
  Filled 2020-06-10: qty 510

## 2020-06-10 MED ORDER — SODIUM CHLORIDE 0.9 % IV SOLN
Freq: Once | INTRAVENOUS | Status: AC
  Filled 2020-06-10: qty 250

## 2020-06-10 NOTE — Telephone Encounter (Signed)
I called and sw pt to advise pt that rx has been sent to pharm will call with any other questions.

## 2020-06-10 NOTE — Patient Instructions (Signed)

## 2020-06-24 ENCOUNTER — Telehealth: Payer: Self-pay | Admitting: Surgical

## 2020-06-24 ENCOUNTER — Other Ambulatory Visit: Payer: Self-pay | Admitting: Surgical

## 2020-06-24 MED ORDER — HYDROCODONE-ACETAMINOPHEN 5-325 MG PO TABS: 1.0000 | ORAL_TABLET | Freq: Every day | ORAL | 0 refills | Status: DC | PRN

## 2020-06-24 NOTE — Telephone Encounter (Signed)
Patient called requesting a refill of oxycodone. Please send to pharmacy on file. Patient pone number is [phone].

## 2020-06-24 NOTE — Telephone Encounter (Signed)
IC s/w pt and advised.

## 2020-06-24 NOTE — Telephone Encounter (Signed)
Sent in RX for [place] with how far she is out from procedure

## 2020-06-24 NOTE — Telephone Encounter (Signed)
Pls advise.  

## 2020-06-30 ENCOUNTER — Ambulatory Visit (INDEPENDENT_AMBULATORY_CARE_PROVIDER_SITE_OTHER): Payer: Medicare Other | Admitting: Orthopedic Surgery

## 2020-06-30 DIAGNOSIS — Z96659 Presence of unspecified artificial knee joint: Secondary | ICD-10-CM

## 2020-07-03 ENCOUNTER — Encounter: Payer: Self-pay | Admitting: Orthopedic Surgery

## 2020-07-03 NOTE — Progress Notes (Signed)
Post-Op Visit Note   Patient: Kelly Soto           Date of Birth: 02-28-1956           MRN: [id number] Visit Date: 06/30/2020 PCP: [name], MD   Assessment & Plan:  Chief Complaint:  Chief Complaint  Patient presents with  . Right Knee - Routine Post Op   Visit Diagnoses:  1. History of prosthetic unicompartmental arthroplasty of knee     Plan: Kelly Soto is a patient is now about 2 months out right partial knee replacement medially.  He is improving.  Therapy twice a week.  Ambulating with cane.  Changed to Norco last week.  Biking 20 minutes in therapy.  Walks at home with a cane.  On exam she has range of motion 5-1 10.  No effusion.  Plan is for Norco at night muscle relaxer for physical therapy.  Cannot take anti-inflammatories.  Therapy 2 times a week for the next 4 weeks with 4-week return.  We will change her over to Ultram next after she runs out of her Norco.  Follow-Up Instructions: Return in about 4 weeks (around 07/28/2020).   Orders:  No orders of the defined types were placed in this encounter.  No orders of the defined types were placed in this encounter.   Imaging: No results found.  PMFS History: Patient Active Problem List   Diagnosis Date Noted  . 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 Chronic renal insufficiency 09/24/2013  . Anemia of renal disease 09/24/2013  . H/O gastric bypass 08/22/2013  . Intestinal malabsorption 08/22/2013  . Anemia, iron deficiency 08/22/2013  . Chronic eustachian tube dysfunction 03/28/2011  . Diabetes mellitus ([place]) 03/28/2011   Past Medical History:  Diagnosis Date  . Anemia of renal disease 09/24/2013  . Anxiety   . Arthritis   . Chronic renal insufficiency 09/24/2013  . Diabetes mellitus without complication ([place])    Type II  . Gastroparesis   . GERD (gastroesophageal reflux disease)   . Headache    migrains  . History of hiatal hernia   . Hypertension   . PONV (postoperative nausea  and vomiting)   . Sleep apnea    Has not used the Cpap since having gastric bypass surgery.  [name] Ulcer     Family History  Problem Relation Age of Onset  . Atrial fibrillation Mother   . Heart attack Father   . Sudden Cardiac Death Father   . Stroke Maternal Grandmother     Past Surgical History:  Procedure Laterality Date  . ABDOMINAL HYSTERECTOMY  1992  . BREAST BIOPSY Left 03/26/2017   benign  . CARDIAC CATHETERIZATION     46yrs ago- negative  . CARPAL TUNNEL RELEASE Bilateral   . CHOLECYSTECTOMY N/A 05/11/2016   Procedure: LAPAROSCOPIC CHOLECYSTECTOMY;  Surgeon: Erroll Luna, MD;  Location: [place];  Service: General;  Laterality: N/A;  . EYE SURGERY     Bilateral cataracts  . GASTRIC BYPASS  2009  . PARTIAL KNEE ARTHROPLASTY Right 05/04/2020   Procedure: right partial knee replacement;  Surgeon: Meredith Pel, MD;  Location: [facility];  Service: Orthopedics;  Laterality: Right;   Social History   Occupational History  . Not on file  Tobacco Use  . Smoking status: Never Smoker  . Smokeless tobacco: Never Used  . Tobacco comment: never used tobacco  Vaping Use  . Vaping Use: Never used  Substance and Sexual Activity  . Alcohol use: No  Alcohol/week: 0.0 standard drinks  . Drug use: No  . Sexual activity: Yes

## 2020-07-05 ENCOUNTER — Inpatient Hospital Stay: Payer: Medicare Other

## 2020-07-05 ENCOUNTER — Encounter: Payer: Self-pay | Admitting: Hematology & Oncology

## 2020-07-05 ENCOUNTER — Other Ambulatory Visit: Payer: Self-pay

## 2020-07-05 ENCOUNTER — Inpatient Hospital Stay (HOSPITAL_BASED_OUTPATIENT_CLINIC_OR_DEPARTMENT_OTHER): Payer: Medicare Other | Admitting: Hematology & Oncology

## 2020-07-05 ENCOUNTER — Telehealth: Payer: Self-pay | Admitting: Hematology & Oncology

## 2020-07-05 ENCOUNTER — Inpatient Hospital Stay: Payer: Medicare Other | Attending: Hematology & Oncology

## 2020-07-05 VITALS — BP 159/90 | HR 82 | Temp 98.0°F | Resp 18 | Wt 150.8 lb

## 2020-07-05 DIAGNOSIS — N1831 Chronic kidney disease, stage 3a: Secondary | ICD-10-CM | POA: Diagnosis not present

## 2020-07-05 DIAGNOSIS — N189 Chronic kidney disease, unspecified: Secondary | ICD-10-CM

## 2020-07-05 DIAGNOSIS — D509 Iron deficiency anemia, unspecified: Secondary | ICD-10-CM | POA: Diagnosis not present

## 2020-07-05 DIAGNOSIS — D631 Anemia in chronic kidney disease: Secondary | ICD-10-CM

## 2020-07-05 DIAGNOSIS — D500 Iron deficiency anemia secondary to blood loss (chronic): Secondary | ICD-10-CM | POA: Diagnosis not present

## 2020-07-05 LAB — CMP (CANCER CENTER ONLY)
ALT: 21 U/L (ref 0–44)
AST: 20 U/L (ref 15–41)
Albumin: 4 g/dL (ref 3.5–5.0)
Alkaline Phosphatase: 46 U/L (ref 38–126)
Anion gap: 9 (ref 5–15)
BUN: 27 mg/dL — ABNORMAL HIGH (ref 8–23)
CO2: 29 mmol/L (ref 22–32)
Calcium: 10.6 mg/dL — ABNORMAL HIGH (ref 8.9–10.3)
Chloride: 102 mmol/L (ref 98–111)
Creatinine: 1.15 mg/dL — ABNORMAL HIGH (ref 0.44–1.00)
GFR, Estimated: 53 mL/min — ABNORMAL LOW (ref >60)
Glucose, Bld: 121 mg/dL — ABNORMAL HIGH (ref 70–99)
Potassium: 4.8 mmol/L (ref 3.5–5.1)
Sodium: 140 mmol/L (ref 135–145)
Total Bilirubin: 0.2 mg/dL — ABNORMAL LOW (ref 0.3–1.2)
Total Protein: 6.9 g/dL (ref 6.5–8.1)

## 2020-07-05 LAB — CBC WITH DIFFERENTIAL (CANCER CENTER ONLY)
Abs Immature Granulocytes: 0.04 10*3/uL (ref 0.00–0.07)
Basophils Absolute: 0.1 10*3/uL (ref 0.0–0.1)
Basophils Relative: 1 %
Eosinophils Absolute: 0.1 10*3/uL (ref 0.0–0.5)
Eosinophils Relative: 1 %
HCT: 37.8 % (ref 36.0–46.0)
Hemoglobin: 11.8 g/dL — ABNORMAL LOW (ref 12.0–15.0)
Immature Granulocytes: 0 %
Lymphocytes Relative: 19 %
Lymphs Abs: 1.8 10*3/uL (ref 0.7–4.0)
MCH: 29.1 pg (ref 26.0–34.0)
MCHC: 31.2 g/dL (ref 30.0–36.0)
MCV: 93.3 fL (ref 80.0–100.0)
Monocytes Absolute: 0.7 10*3/uL (ref 0.1–1.0)
Monocytes Relative: 8 %
Neutro Abs: 7.1 10*3/uL (ref 1.7–7.7)
Neutrophils Relative %: 71 %
Platelet Count: 228 10*3/uL (ref 150–400)
RBC: 4.05 MIL/uL (ref 3.87–5.11)
RDW: 14.8 % (ref 11.5–15.5)
WBC Count: 9.8 10*3/uL (ref 4.0–10.5)
nRBC: 0 % (ref 0.0–0.2)

## 2020-07-05 LAB — IRON AND TIBC
Iron: 125 ug/dL (ref 41–142)
Saturation Ratios: 51 % (ref 21–57)
TIBC: 248 ug/dL (ref 236–444)
UIBC: 123 ug/dL (ref 120–384)

## 2020-07-05 LAB — RETICULOCYTES
Immature Retic Fract: 4.8 % (ref 2.3–15.9)
RBC.: 4.1 MIL/uL (ref 3.87–5.11)
Retic Count, Absolute: 36.9 10*3/uL (ref 19.0–186.0)
Retic Ct Pct: 0.9 % (ref 0.4–3.1)

## 2020-07-05 LAB — FERRITIN: Ferritin: 1125 ng/mL — ABNORMAL HIGH (ref 11–307)

## 2020-07-05 NOTE — Telephone Encounter (Signed)
Appointments scheduled patient has My Chart for updates per1/10 los

## 2020-07-05 NOTE — Telephone Encounter (Signed)
Advised patient of appt scheduled for 1/17 per 1/10 sch msg

## 2020-07-05 NOTE — Progress Notes (Signed)
Hematology and Oncology Follow Up Visit  Kelly Soto [id number] 09/14/1955 65 y.o. 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  Kelly Soto is back for follow-up.  She enjoyed the holidays.  She had a wonderful Christmas and New Year's.  Her right knee is doing a whole lot better.  She now just has a cane.  She is able to drive her self in to the office.  She has had no problems with fever.  There is been no change in bowel or bladder habits.  She is doing a good job with her blood sugars.  She did get iron back in December.  At that time, her iron saturation was 14%.  She has had no issues with cough.  There is no rashes.  She has had no leg swelling.  She has had no headache.  Overall, her performance status is ECOG 1.     Medications:  Current Outpatient Medications:  .  Ascorbic Acid (VITAMIN C) 1000 MG tablet, Take 1,000 mg by mouth daily., Disp: , Rfl:  .  baclofen (LIORESAL) 10 MG tablet, 1 tablet with food or milk as needed, Disp: , Rfl:  .  BD PEN NEEDLE NANO U/F 32G X 4 MM MISC, USE 1 ONCE DAILY WITH TRESIBA, Disp: , Rfl:  .  betamethasone valerate (VALISONE) 0.1 % cream, Apply 1 application topically daily as needed (irritation)., Disp: , Rfl:  .  Blood Glucose Monitoring Suppl (CONTOUR NEXT EZ) w/Device KIT, USE TO CHECK BLOOD SUGARS DAILY, Disp: , Rfl:  .  CONTOUR NEXT TEST test strip, USE STRIP TO CHECK GLUCOSE ONCE DAILY, Disp: , Rfl:  .  denosumab (PROLIA) 60 MG/ML SOLN injection, Inject 60 mg into the skin every 6 (six) months. Administer in upper arm, thigh, or abdomen  Next injection is due 05-2016, Disp: , Rfl:  .  diazepam (VALIUM) 5 MG tablet, Take  5 mg by mouth daily as needed for anxiety. , Disp: , Rfl: 0 .  fluconazole (DIFLUCAN) 150 MG tablet, Take 150 mg by mouth., Disp: , Rfl:  .  fluticasone (FLONASE) 50 MCG/ACT nasal spray, Place 2 sprays into both nostrils at bedtime., Disp: , Rfl:  .  Galcanezumab-gnlm (EMGALITY) 120 MG/ML SOAJ, Inject 120 mg into the skin every 30 (thirty) days., Disp: , Rfl:  .  glyBURIDE-metformin (GLUCOVANCE) 2.5-500 MG per tablet, Take 2 tablets by mouth 2 (two) times daily., Disp: , Rfl:  .  HYDROcodone-acetaminophen (NORCO/VICODIN) 5-325 MG tablet, Take 1 tablet by mouth daily as needed for moderate pain., Disp: 20 tablet, Rfl: 0 .  hydroxypropyl methylcellulose / hypromellose (ISOPTO TEARS / GONIOVISC) 2.5 % ophthalmic solution, Place 1 drop into both eyes 3 (three) times daily as needed for dry eyes. , Disp: , Rfl:  .  methocarbamol (ROBAXIN) 500 MG tablet, Take 1 tablet (500 mg total) by mouth every 8 (eight) hours as needed for muscle spasms., Disp: 30 tablet, Rfl: 0 .  metoprolol succinate (TOPROL-XL) 25 MG 24 hr tablet, Take 25 mg by mouth every morning., Disp: , Rfl:  .  Microlet Lancets MISC, USE AS DIRECTED TO CHECK BLOOD SUGAR, Disp: , Rfl:  .  Multiple Vitamin (  MULTI-VITAMINS) TABS, Take 1 tablet by mouth daily. , Disp: , Rfl:  .  omeprazole (PRILOSEC) 40 MG capsule, Take 40 mg by mouth 2 (two) times daily., Disp: , Rfl:  .  pioglitazone (ACTOS) 45 MG tablet, Take 45 mg by mouth daily., Disp: , Rfl:  .  Simethicone (GAS FREE EXTRA STRENGTH PO), Take 2 tablets by mouth at bedtime., Disp: , Rfl:  .  sodium chloride (OCEAN) 0.65 % nasal spray, 2 sprays, Disp: , Rfl:  .  tobramycin (TOBREX) 0.3 % ophthalmic solution, Place 1 drop into both eyes See admin instructions. Instill one drop into the affected eye four times a day prior and the day after eye injection., Disp: , Rfl:  .  TRESIBA FLEXTOUCH 100 UNIT/ML SOPN FlexTouch Pen, Inject 12 Units into the skin daily. , Disp: , Rfl:  .  triamcinolone  (KENALOG) 0.147 MG/GM topical spray, 1 application, Disp: , Rfl:  .  ondansetron (ZOFRAN) 4 MG tablet, Take 1 tablet (4 mg total) by mouth every 6 (six) hours as needed for nausea., Disp: 30 tablet, Rfl: 0  Allergies:  Allergies  Allergen Reactions  . Ace Inhibitors Other (See Comments) and Anaphylaxis    Angioedema  . Aspirin Other (See Comments)    Cannot take due to gastric bypass surgery  . Nsaids Anaphylaxis and Other (See Comments)    S/p gastric bypass Cannot take orally due to gastric bypass S/p gastric bypass  . Sular [Nisoldipine Er] Other (See Comments)    angioedema  . Sular [Nisoldipine] Swelling  . Boniva [Ibandronic Acid] Other (See Comments)    Joint pain    Past Medical History, Surgical history, Social history, and Family History were reviewed and updated.  Review of Systems: Review of Systems  Constitutional: Negative.   HENT: Negative.   Eyes: Negative.   Respiratory: Negative.   Cardiovascular: Negative.   Gastrointestinal: Negative.   Genitourinary: Negative.   Musculoskeletal: Negative.   Skin: Negative.   Neurological: Negative.   Endo/Heme/Allergies: Negative.   Psychiatric/Behavioral: Negative.     Physical Exam:  weight is 150 lb 12 oz (68.4 kg). Her oral temperature is 98 F (36.7 C). Her blood pressure is 159/90 (abnormal) and her pulse is 82. Her respiration is 18 and oxygen saturation is 100%.   Physical Exam Vitals reviewed.  HENT:     Head: Normocephalic and atraumatic.  Eyes:     Pupils: Pupils are equal, round, and reactive to light.  Cardiovascular:     Rate and Rhythm: Normal rate and regular rhythm.     Heart sounds: Normal heart sounds.  Pulmonary:     Effort: Pulmonary effort is normal.     Breath sounds: Normal breath sounds.  Abdominal:     General: Bowel sounds are normal.     Palpations: Abdomen is soft.  Musculoskeletal:        General: No tenderness or deformity. Normal range of motion.     Cervical back: Normal  range of motion.  Lymphadenopathy:     Cervical: No cervical adenopathy.  Skin:    General: Skin is warm and dry.     Findings: No erythema or rash.  Neurological:     Mental Status: She is alert and oriented to person, place, and time.  Psychiatric:        Behavior: Behavior normal.        Thought Content: Thought content normal.        Judgment: Judgment normal.  Lab Results  Component Value Date   WBC 9.8 07/05/2020   HGB 11.8 (L) 07/05/2020   HCT 37.8 07/05/2020   MCV 93.3 07/05/2020   PLT 228 07/05/2020     Chemistry      Component Value Date/Time   NA 140 06/07/2020 0757   NA 145 05/10/2017 0748   NA 137 12/06/2016 0746   K 5.2 (H) 06/07/2020 0757   K 4.7 05/10/2017 0748   K 5.2 (H) 12/06/2016 0746   CL 103 06/07/2020 0757   CL 110 (H) 05/10/2017 0748   CO2 27 06/07/2020 0757   CO2 23 05/10/2017 0748   CO2 23 12/06/2016 0746   BUN 33 (H) 06/07/2020 0757   BUN 28 (H) 05/10/2017 0748   BUN 26.1 (H) 12/06/2016 0746   CREATININE 1.27 (H) 06/07/2020 0757   CREATININE 1.2 05/10/2017 0748   CREATININE 1.2 (H) 12/06/2016 0746      Component Value Date/Time   CALCIUM 9.9 06/07/2020 0757   CALCIUM 9.1 05/10/2017 0748   CALCIUM 9.1 12/06/2016 0746   ALKPHOS 47 06/07/2020 0757   ALKPHOS 61 05/10/2017 0748   ALKPHOS 41 12/06/2016 0746   AST 15 06/07/2020 0757   AST 23 12/06/2016 0746   ALT 12 06/07/2020 0757   ALT 31 05/10/2017 0748   ALT 26 12/06/2016 0746   BILITOT 0.3 06/07/2020 0757   BILITOT 0.26 12/06/2016 0746       Impression and Plan: Ms. Linenberger is 65 year old white female. She has diabetes. She has iron deficiency anemia. She has a very low erythropoietin level.  We do not have to give her anything today.  We will see what her iron studies look like.  I have to leave that that we gave her back in December is really helping.  I think we can now move her appointment to 6 weeks.  Hopefully, by then, she will not need a cane when she comes in.     Volanda Napoleon, MD 1/10/20228:11 AM

## 2020-07-12 ENCOUNTER — Other Ambulatory Visit: Payer: Self-pay

## 2020-07-12 ENCOUNTER — Inpatient Hospital Stay: Payer: Medicare Other

## 2020-07-12 VITALS — BP 170/81 | HR 80 | Temp 98.2°F | Resp 18

## 2020-07-12 DIAGNOSIS — N189 Chronic kidney disease, unspecified: Secondary | ICD-10-CM

## 2020-07-12 DIAGNOSIS — D509 Iron deficiency anemia, unspecified: Secondary | ICD-10-CM | POA: Diagnosis not present

## 2020-07-12 DIAGNOSIS — D631 Anemia in chronic kidney disease: Secondary | ICD-10-CM

## 2020-07-12 MED ORDER — SODIUM CHLORIDE 0.9 % IV SOLN
Freq: Once | INTRAVENOUS | Status: AC
  Filled 2020-07-12: qty 250

## 2020-07-12 MED ORDER — SODIUM CHLORIDE 0.9 % IV SOLN
510.0000 mg | Freq: Once | INTRAVENOUS | Status: AC
  Administered 2020-07-12: 510 mg via INTRAVENOUS
  Filled 2020-07-12: qty 510

## 2020-07-12 NOTE — Patient Instructions (Signed)
Ferumoxytol injection What is this medicine? FERUMOXYTOL is an iron complex. Iron is used to make healthy red blood cells, which carry oxygen and nutrients throughout the body. This medicine is used to treat iron deficiency anemia. This medicine may be used for other purposes; ask your health care provider or pharmacist if you have questions. COMMON BRAND NAME(S): Feraheme What should I tell my health care provider before I take this medicine? They need to know if you have any of these conditions:  anemia not caused by low iron levels  high levels of iron in the blood  magnetic resonance imaging (MRI) test scheduled  an unusual or allergic reaction to iron, other medicines, foods, dyes, or preservatives  pregnant or trying to get pregnant  breast-feeding How should I use this medicine? This medicine is for injection into a vein. It is given by a health care professional in a hospital or clinic setting. Talk to your pediatrician regarding the use of this medicine in children. Special care may be needed. Overdosage: If you think you have taken too much of this medicine contact a poison control center or emergency room at once. NOTE: This medicine is only for you. Do not share this medicine with others. What if I miss a dose? It is important not to miss your dose. Call your doctor or health care professional if you are unable to keep an appointment. What may interact with this medicine? This medicine may interact with the following medications:  other iron products This list may not describe all possible interactions. Give your health care provider a list of all the medicines, herbs, non-prescription drugs, or dietary supplements you use. Also tell them if you smoke, drink alcohol, or use illegal drugs. Some items may interact with your medicine. What should I watch for while using this medicine? Visit your doctor or healthcare professional regularly. Tell your doctor or healthcare  professional if your symptoms do not start to get better or if they get worse. You may need blood work done while you are taking this medicine. You may need to follow a special diet. Talk to your doctor. Foods that contain iron include: whole grains/cereals, dried fruits, beans, or peas, leafy green vegetables, and organ meats (liver, kidney). What side effects may I notice from receiving this medicine? Side effects that you should report to your doctor or health care professional as soon as possible:  allergic reactions like skin rash, itching or hives, swelling of the face, lips, or tongue  breathing problems  changes in blood pressure  feeling faint or lightheaded, falls  fever or chills  flushing, sweating, or hot feelings  swelling of the ankles or feet Side effects that usually do not require medical attention (report to your doctor or health care professional if they continue or are bothersome):  diarrhea  headache  nausea, vomiting  stomach pain This list may not describe all possible side effects. Call your doctor for medical advice about side effects. You may report side effects to FDA at 1-800-FDA-1088. Where should I keep my medicine? This drug is given in a hospital or clinic and will not be stored at home. NOTE: This sheet is a summary. It may not cover all possible information. If you have questions about this medicine, talk to your doctor, pharmacist, or health care provider.  2021 Elsevier/Gold Standard (2016-07-31 20:21:10)  

## 2020-07-28 ENCOUNTER — Ambulatory Visit (INDEPENDENT_AMBULATORY_CARE_PROVIDER_SITE_OTHER): Payer: Medicare Other | Admitting: Orthopedic Surgery

## 2020-07-28 ENCOUNTER — Other Ambulatory Visit: Payer: Self-pay

## 2020-07-28 DIAGNOSIS — Z96659 Presence of unspecified artificial knee joint: Secondary | ICD-10-CM

## 2020-08-01 ENCOUNTER — Encounter: Payer: Self-pay | Admitting: Orthopedic Surgery

## 2020-08-01 NOTE — Progress Notes (Signed)
Post-Op Visit Note   Patient: Kelly Soto           Date of Birth: 21-Feb-1956           MRN: [id number] Visit Date: 07/28/2020 PCP: [name], MD   Assessment & Plan:  Chief Complaint: No chief complaint on file.  Visit Diagnoses:  1. History of prosthetic unicompartmental arthroplasty of knee     Plan: Patient is a 65 year old female presents s/p right partial knee replacement on 05/04/2020.  Doing well overall.  Doing physical therapy 2 times per week which mainly involves her working on her strength.  She is doing this at [facility] physical therapy.  She does note that the knee gives out on her when she feels fatigued.  The knee gave out on her 4 times on "Sunday and 1 time on Saturday.  Range of motion of the right knee reveals 3 degrees extension and 120 degrees of flexion.  No gross mid flexion instability.  Able to perform straight leg raise.  5 -/5 strength of quadriceps, hamstring.  Incision has healed well with no evidence of infection or dehiscence.  No calf tenderness.  Negative Homans' sign.  She does note occasional lateral pain but nothing that is consistent.  She only takes Tylenol for pain control this point.  She feels that she is progressively improving in regards to the strength of the right knee.  Plan for continuing physical therapy and transition to home exercise program after she finishes therapy.  Follow-up in 3 months for clinical recheck.  Follow-Up Instructions: No follow-ups on file.   Orders:  No orders of the defined types were placed in this encounter.  No orders of the defined types were placed in this encounter.   Imaging: No results found.  PMFS History: Patient Active Problem List   Diagnosis Date Noted  . 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 Chronic renal insufficiency 09/24/2013  . Anemia of renal disease 09/24/2013  . H/O gastric bypass 08/22/2013  . Intestinal malabsorption 08/22/2013  . Anemia, iron  deficiency 08/22/2013  . Chronic eustachian tube dysfunction 03/28/2011  . Diabetes mellitus (HCC) 03/28/2011   Past Medical History:  Diagnosis Date  . Anemia of renal disease 09/24/2013  . Anxiety   . Arthritis   . Chronic renal insufficiency 09/24/2013  . Diabetes mellitus without complication (HCC)    Type II  . Gastroparesis   . GERD (gastroesophageal reflux disease)   . Headache    migrains  . History of hiatal hernia   . Hypertension   . PONV (postoperative nausea and vomiting)   . Sleep apnea    Has not used the Cpap since having gastric bypass surgery.  . Ulcer     Family History  Problem Relation Age of Onset  . Atrial fibrillation Mother   . Heart attack Father   . Sudden Cardiac Death Father   . Stroke Maternal Grandmother     Past Surgical History:  Procedure Laterality Date  . ABDOMINAL HYSTERECTOMY  1992  . BREAST BIOPSY Left 03/26/2017   benign  . CARDIAC CATHETERIZATION     20" yrs ago- negative  . CARPAL TUNNEL RELEASE Bilateral   . CHOLECYSTECTOMY N/A 05/11/2016   Procedure: LAPAROSCOPIC CHOLECYSTECTOMY;  Surgeon: Erroll Luna, MD;  Location: [facility];  Service: General;  Laterality: N/A;  . EYE SURGERY     Bilateral cataracts  . GASTRIC BYPASS  2009  . PARTIAL KNEE ARTHROPLASTY Right 05/04/2020   Procedure:  right partial knee replacement;  Surgeon: Meredith Pel, MD;  Location: [facility];  Service: Orthopedics;  Laterality: Right;   Social History   Occupational History  . Not on file  Tobacco Use  . Smoking status: Never Smoker  . Smokeless tobacco: Never Used  . Tobacco comment: never used tobacco  Vaping Use  . Vaping Use: Never used  Substance and Sexual Activity  . Alcohol use: No    Alcohol/week: 0.0 standard drinks  . Drug use: No  . Sexual activity: Yes

## 2020-08-16 ENCOUNTER — Inpatient Hospital Stay: Payer: Medicare Other | Attending: Hematology & Oncology

## 2020-08-16 ENCOUNTER — Telehealth: Payer: Self-pay

## 2020-08-16 ENCOUNTER — Inpatient Hospital Stay: Payer: Medicare Other

## 2020-08-16 ENCOUNTER — Telehealth: Payer: Self-pay | Admitting: *Deleted

## 2020-08-16 ENCOUNTER — Encounter: Payer: Self-pay | Admitting: Hematology & Oncology

## 2020-08-16 ENCOUNTER — Inpatient Hospital Stay (HOSPITAL_BASED_OUTPATIENT_CLINIC_OR_DEPARTMENT_OTHER): Payer: Medicare Other | Admitting: Hematology & Oncology

## 2020-08-16 ENCOUNTER — Other Ambulatory Visit: Payer: Self-pay

## 2020-08-16 VITALS — BP 140/71 | HR 78 | Temp 98.7°F | Resp 18 | Wt 152.0 lb

## 2020-08-16 DIAGNOSIS — N1831 Chronic kidney disease, stage 3a: Secondary | ICD-10-CM

## 2020-08-16 DIAGNOSIS — D501 Sideropenic dysphagia: Secondary | ICD-10-CM | POA: Diagnosis not present

## 2020-08-16 DIAGNOSIS — D631 Anemia in chronic kidney disease: Secondary | ICD-10-CM | POA: Insufficient documentation

## 2020-08-16 DIAGNOSIS — N189 Chronic kidney disease, unspecified: Secondary | ICD-10-CM | POA: Insufficient documentation

## 2020-08-16 DIAGNOSIS — D500 Iron deficiency anemia secondary to blood loss (chronic): Secondary | ICD-10-CM

## 2020-08-16 LAB — CBC WITH DIFFERENTIAL (CANCER CENTER ONLY)
Abs Immature Granulocytes: 0.06 10*3/uL (ref 0.00–0.07)
Basophils Absolute: 0.1 10*3/uL (ref 0.0–0.1)
Basophils Relative: 1 %
Eosinophils Absolute: 0.1 10*3/uL (ref 0.0–0.5)
Eosinophils Relative: 1 %
HCT: 32.9 % — ABNORMAL LOW (ref 36.0–46.0)
Hemoglobin: 10.4 g/dL — ABNORMAL LOW (ref 12.0–15.0)
Immature Granulocytes: 1 %
Lymphocytes Relative: 12 %
Lymphs Abs: 1.3 10*3/uL (ref 0.7–4.0)
MCH: 29.6 pg (ref 26.0–34.0)
MCHC: 31.6 g/dL (ref 30.0–36.0)
MCV: 93.7 fL (ref 80.0–100.0)
Monocytes Absolute: 1.1 10*3/uL — ABNORMAL HIGH (ref 0.1–1.0)
Monocytes Relative: 10 %
Neutro Abs: 8.1 10*3/uL — ABNORMAL HIGH (ref 1.7–7.7)
Neutrophils Relative %: 75 %
Platelet Count: 230 10*3/uL (ref 150–400)
RBC: 3.51 MIL/uL — ABNORMAL LOW (ref 3.87–5.11)
RDW: 15.3 % (ref 11.5–15.5)
WBC Count: 10.6 10*3/uL — ABNORMAL HIGH (ref 4.0–10.5)
nRBC: 0 % (ref 0.0–0.2)

## 2020-08-16 LAB — CMP (CANCER CENTER ONLY)
ALT: 28 U/L (ref 0–44)
AST: 25 U/L (ref 15–41)
Albumin: 4 g/dL (ref 3.5–5.0)
Alkaline Phosphatase: 67 U/L (ref 38–126)
Anion gap: 8 (ref 5–15)
BUN: 31 mg/dL — ABNORMAL HIGH (ref 8–23)
CO2: 27 mmol/L (ref 22–32)
Calcium: 10.1 mg/dL (ref 8.9–10.3)
Chloride: 103 mmol/L (ref 98–111)
Creatinine: 1.19 mg/dL — ABNORMAL HIGH (ref 0.44–1.00)
GFR, Estimated: 50 mL/min — ABNORMAL LOW (ref >60)
Glucose, Bld: 198 mg/dL — ABNORMAL HIGH (ref 70–99)
Potassium: 5.5 mmol/L — ABNORMAL HIGH (ref 3.5–5.1)
Sodium: 138 mmol/L (ref 135–145)
Total Bilirubin: 0.3 mg/dL (ref 0.3–1.2)
Total Protein: 7.1 g/dL (ref 6.5–8.1)

## 2020-08-16 LAB — RETICULOCYTES
Immature Retic Fract: 12.4 % (ref 2.3–15.9)
RBC.: 3.54 MIL/uL — ABNORMAL LOW (ref 3.87–5.11)
Retic Count, Absolute: 63.7 10*3/uL (ref 19.0–186.0)
Retic Ct Pct: 1.8 % (ref 0.4–3.1)

## 2020-08-16 LAB — IRON AND TIBC
Iron: 65 ug/dL (ref 41–142)
Saturation Ratios: 25 % (ref 21–57)
TIBC: 263 ug/dL (ref 236–444)
UIBC: 197 ug/dL (ref 120–384)

## 2020-08-16 LAB — FERRITIN: Ferritin: 1511 ng/mL — ABNORMAL HIGH (ref 11–307)

## 2020-08-16 MED ORDER — DARBEPOETIN ALFA 300 MCG/0.6ML IJ SOSY
300.0000 ug | PREFILLED_SYRINGE | Freq: Once | INTRAMUSCULAR | Status: AC
  Administered 2020-08-16: 300 ug via SUBCUTANEOUS

## 2020-08-16 MED ORDER — DARBEPOETIN ALFA 300 MCG/0.6ML IJ SOSY
PREFILLED_SYRINGE | INTRAMUSCULAR | Status: AC
  Filled 2020-08-16: qty 0.6

## 2020-08-16 NOTE — Progress Notes (Signed)
Hematology and Oncology Follow Up Visit  Kelly Soto [id number] 11/19/1955 65 y.o. 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  Kelly Soto is back for follow-up.  She did have a nice Valentine's Day.  She did have a very nice birthday.  She is try to watch her blood sugars.  She does have elevated potassium.  This is always been chronic for her.  Again is nothing that I really worry about.  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  Only last saw her in January, her ferritin was 1125 with iron saturation of 51%.  She has had no bleeding.  She is had no change in bowel or bladder habits.  Her mother, who is 17 result, was recently found to have breast cancer.  [name] would like for [place] to see her.  We will see what we can do to get her in.  She has had no rashes.  She has had no tingling or numbness in the hands or feet.  Overall, her performance status is ECOG 1.    Medications:  Current Outpatient Medications:  .  Ascorbic Acid (VITAMIN C) 1000 MG tablet, Take 1,000 mg by mouth daily., Disp: , Rfl:  .  baclofen (LIORESAL) 10 MG tablet, 1 tablet with food or milk as needed, Disp: , Rfl:  .  BD PEN NEEDLE NANO U/F 32G X 4 MM MISC, USE 1 ONCE DAILY WITH TRESIBA, Disp: , Rfl:  .  betamethasone valerate (VALISONE) 0.1 % cream, Apply 1 application topically daily as needed (irritation)., Disp: , Rfl:  .  Blood Glucose Monitoring Suppl (CONTOUR NEXT EZ) w/Device KIT, USE TO CHECK BLOOD SUGARS DAILY, Disp: , Rfl:  .  CONTOUR NEXT TEST test strip, USE STRIP TO CHECK GLUCOSE ONCE DAILY, Disp: , Rfl:  .  denosumab (PROLIA) 60 MG/ML SOLN injection, Inject 60 mg  into the skin every 6 (six) months. Administer in upper arm, thigh, or abdomen  Next injection is due 05-2016, Disp: , Rfl:  .  diazepam (VALIUM) 5 MG tablet, Take 5 mg by mouth daily as needed for anxiety. , Disp: , Rfl: 0 .  fluconazole (DIFLUCAN) 150 MG tablet, Take 150 mg by mouth., Disp: , Rfl:  .  fluticasone (FLONASE) 50 MCG/ACT nasal spray, Place 2 sprays into both nostrils at bedtime., Disp: , Rfl:  .  Galcanezumab-gnlm (EMGALITY) 120 MG/ML SOAJ, Inject 120 mg into the skin every 30 (thirty) days., Disp: , Rfl:  .  glyBURIDE-metformin (GLUCOVANCE) 2.5-500 MG per tablet, Take 2 tablets by mouth 2 (two) times daily., Disp: , Rfl:  .  HYDROcodone-acetaminophen (NORCO/VICODIN) 5-325 MG tablet, Take 1 tablet by mouth daily as needed for moderate pain., Disp: 20 tablet, Rfl: 0 .  hydroxypropyl methylcellulose / hypromellose (ISOPTO TEARS / GONIOVISC) 2.5 % ophthalmic solution, Place 1 drop into both eyes 3 (three) times daily as needed for dry eyes. , Disp: , Rfl:  .  methocarbamol (ROBAXIN) 500 MG tablet, Take 1 tablet (500 mg total) by mouth every 8 (eight) hours as needed for muscle spasms., Disp: 30 tablet, Rfl: 0 .  metoprolol succinate (TOPROL-XL) 25 MG 24 hr tablet, Take 25  mg by mouth every morning., Disp: , Rfl:  .  Microlet Lancets MISC, USE AS DIRECTED TO CHECK BLOOD SUGAR, Disp: , Rfl:  .  Multiple Vitamin (MULTI-VITAMINS) TABS, Take 1 tablet by mouth daily. , Disp: , Rfl:  .  omeprazole (PRILOSEC) 40 MG capsule, Take 40 mg by mouth 2 (two) times daily., Disp: , Rfl:  .  ondansetron (ZOFRAN) 4 MG tablet, Take 1 tablet (4 mg total) by mouth every 6 (six) hours as needed for nausea., Disp: 30 tablet, Rfl: 0 .  pioglitazone (ACTOS) 45 MG tablet, Take 45 mg by mouth daily., Disp: , Rfl:  .  Simethicone (GAS FREE EXTRA STRENGTH PO), Take 2 tablets by mouth at bedtime., Disp: , Rfl:  .  sodium chloride (OCEAN) 0.65 % nasal spray, 2 sprays, Disp: , Rfl:  .  tobramycin (TOBREX) 0.3 %  ophthalmic solution, Place 1 drop into both eyes See admin instructions. Instill one drop into the affected eye four times a day prior and the day after eye injection., Disp: , Rfl:  .  TRESIBA FLEXTOUCH 100 UNIT/ML SOPN FlexTouch Pen, Inject 12 Units into the skin daily. , Disp: , Rfl:  .  triamcinolone (KENALOG) 0.147 MG/GM topical spray, 1 application, Disp: , Rfl:   Allergies:  Allergies  Allergen Reactions  . Ace Inhibitors Other (See Comments) and Anaphylaxis    Angioedema  . Aspirin Other (See Comments)    Cannot take due to gastric bypass surgery  . Nsaids Anaphylaxis and Other (See Comments)    S/p gastric bypass Cannot take orally due to gastric bypass S/p gastric bypass  . Sular [Nisoldipine Er] Other (See Comments)    angioedema  . Sular [Nisoldipine] Swelling  . Boniva [Ibandronic Acid] Other (See Comments)    Joint pain    Past Medical History, Surgical history, Social history, and Family History were reviewed and updated.  Review of Systems: Review of Systems  Constitutional: Negative.   HENT: Negative.   Eyes: Negative.   Respiratory: Negative.   Cardiovascular: Negative.   Gastrointestinal: Negative.   Genitourinary: Negative.   Musculoskeletal: Negative.   Skin: Negative.   Neurological: Negative.   Endo/Heme/Allergies: Negative.   Psychiatric/Behavioral: Negative.     Physical Exam:  vitals were not taken for this visit.   Physical Exam Vitals reviewed.  HENT:     Head: Normocephalic and atraumatic.  Eyes:     Pupils: Pupils are equal, round, and reactive to light.  Cardiovascular:     Rate and Rhythm: Normal rate and regular rhythm.     Heart sounds: Normal heart sounds.  Pulmonary:     Effort: Pulmonary effort is normal.     Breath sounds: Normal breath sounds.  Abdominal:     General: Bowel sounds are normal.     Palpations: Abdomen is soft.  Musculoskeletal:        General: No tenderness or deformity. Normal range of motion.      Cervical back: Normal range of motion.  Lymphadenopathy:     Cervical: No cervical adenopathy.  Skin:    General: Skin is warm and dry.     Findings: No erythema or rash.  Neurological:     Mental Status: She is alert and oriented to person, place, and time.  Psychiatric:        Behavior: Behavior normal.        Thought Content: Thought content normal.        Judgment: Judgment normal.  Lab Results  Component Value Date   WBC 10.6 (H) 08/16/2020   HGB 10.4 (L) 08/16/2020   HCT 32.9 (L) 08/16/2020   MCV 93.7 08/16/2020   PLT 230 08/16/2020     Chemistry      Component Value Date/Time   NA 140 07/05/2020 0733   NA 145 05/10/2017 0748   NA 137 12/06/2016 0746   K 4.8 07/05/2020 0733   K 4.7 05/10/2017 0748   K 5.2 (H) 12/06/2016 0746   CL 102 07/05/2020 0733   CL 110 (H) 05/10/2017 0748   CO2 29 07/05/2020 0733   CO2 23 05/10/2017 0748   CO2 23 12/06/2016 0746   BUN 27 (H) 07/05/2020 0733   BUN 28 (H) 05/10/2017 0748   BUN 26.1 (H) 12/06/2016 0746   CREATININE 1.15 (H) 07/05/2020 0733   CREATININE 1.2 05/10/2017 0748   CREATININE 1.2 (H) 12/06/2016 0746      Component Value Date/Time   CALCIUM 10.6 (H) 07/05/2020 0733   CALCIUM 9.1 05/10/2017 0748   CALCIUM 9.1 12/06/2016 0746   ALKPHOS 46 07/05/2020 0733   ALKPHOS 61 05/10/2017 0748   ALKPHOS 41 12/06/2016 0746   AST 20 07/05/2020 0733   AST 23 12/06/2016 0746   ALT 21 07/05/2020 0733   ALT 31 05/10/2017 0748   ALT 26 12/06/2016 0746   BILITOT 0.2 (L) 07/05/2020 0733   BILITOT 0.26 12/06/2016 0746       Impression and Plan: Ms. Onofrio is 65 year old white female. She has diabetes. She has iron deficiency anemia. She has a very low erythropoietin level.  We will go ahead and give her Aranesp today.  I suspect that she probably needs Retacrit which is what her insurance will cover.  I will plan to get her back in another month or so.  I would think that her iron levels should be okay.  She did  get IV iron back in December.   Volanda Napoleon, MD 2/21/20228:03 AM

## 2020-08-16 NOTE — Telephone Encounter (Signed)
-----   Message from Kelly Napoleon, MD sent at 08/16/2020  2:57 PM EST ----- Call - the iron level is ok!!  Kelly Soto

## 2020-08-16 NOTE — Telephone Encounter (Signed)
Called patient and gave upcoming appts. - view mychart

## 2020-08-16 NOTE — Telephone Encounter (Signed)
Called and informed patient of lab results, patient verbalized understanding and denies any questions or concerns at this time.   

## 2020-08-16 NOTE — Patient Instructions (Signed)
Darbepoetin Alfa injection What is this medicine? DARBEPOETIN ALFA (dar be POE e tin AL fa) helps your body make more red blood cells. It is used to treat anemia caused by chronic kidney failure and chemotherapy. This medicine may be used for other purposes; ask your health care provider or pharmacist if you have questions. COMMON BRAND NAME(S): Aranesp What should I tell my health care provider before I take this medicine? They need to know if you have any of these conditions:  blood clotting disorders or history of blood clots  cancer patient not on chemotherapy  cystic fibrosis  heart disease, such as angina, heart failure, or a history of a heart attack  hemoglobin level of 12 g/dL or greater  high blood pressure  low levels of folate, iron, or vitamin B12  seizures  an unusual or allergic reaction to darbepoetin, erythropoietin, albumin, hamster proteins, latex, other medicines, foods, dyes, or preservatives  pregnant or trying to get pregnant  breast-feeding How should I use this medicine? This medicine is for injection into a vein or under the skin. It is usually given by a health care professional in a hospital or clinic setting. If you get this medicine at home, you will be taught how to prepare and give this medicine. Use exactly as directed. Take your medicine at regular intervals. Do not take your medicine more often than directed. It is important that you put your used needles and syringes in a special sharps container. Do not put them in a trash can. If you do not have a sharps container, call your pharmacist or healthcare provider to get one. A special MedGuide will be given to you by the pharmacist with each prescription and refill. Be sure to read this information carefully each time. Talk to your pediatrician regarding the use of this medicine in children. While this medicine may be used in children as young as 1 month of age for selected conditions, precautions do  apply. Overdosage: If you think you have taken too much of this medicine contact a poison control center or emergency room at once. NOTE: This medicine is only for you. Do not share this medicine with others. What if I miss a dose? If you miss a dose, take it as soon as you can. If it is almost time for your next dose, take only that dose. Do not take double or extra doses. What may interact with this medicine? Do not take this medicine with any of the following medications:  epoetin alfa This list may not describe all possible interactions. Give your health care provider a list of all the medicines, herbs, non-prescription drugs, or dietary supplements you use. Also tell them if you smoke, drink alcohol, or use illegal drugs. Some items may interact with your medicine. What should I watch for while using this medicine? Your condition will be monitored carefully while you are receiving this medicine. You may need blood work done while you are taking this medicine. This medicine may cause a decrease in vitamin B6. You should make sure that you get enough vitamin B6 while you are taking this medicine. Discuss the foods you eat and the vitamins you take with your health care professional. What side effects may I notice from receiving this medicine? Side effects that you should report to your doctor or health care professional as soon as possible:  allergic reactions like skin rash, itching or hives, swelling of the face, lips, or tongue  breathing problems  changes in   vision  chest pain  confusion, trouble speaking or understanding  feeling faint or lightheaded, falls  high blood pressure  muscle aches or pains  pain, swelling, warmth in the leg  rapid weight gain  severe headaches  sudden numbness or weakness of the face, arm or leg  trouble walking, dizziness, loss of balance or coordination  seizures (convulsions)  swelling of the ankles, feet, hands  unusually weak or  tired Side effects that usually do not require medical attention (report to your doctor or health care professional if they continue or are bothersome):  diarrhea  fever, chills (flu-like symptoms)  headaches  nausea, vomiting  redness, stinging, or swelling at site where injected This list may not describe all possible side effects. Call your doctor for medical advice about side effects. You may report side effects to FDA at 1-800-FDA-1088. Where should I keep my medicine? Keep out of the reach of children. Store in a refrigerator between 2 and 8 degrees C (36 and 46 degrees F). Do not freeze. Do not shake. Throw away any unused portion if using a single-dose vial. Throw away any unused medicine after the expiration date. NOTE: This sheet is a summary. It may not cover all possible information. If you have questions about this medicine, talk to your doctor, pharmacist, or health care provider.  2021 Elsevier/Gold Standard (2017-06-27 16:44:20)  

## 2020-09-02 LAB — HM COLONOSCOPY

## 2020-09-13 ENCOUNTER — Telehealth: Payer: Self-pay

## 2020-09-13 ENCOUNTER — Other Ambulatory Visit: Payer: Self-pay

## 2020-09-13 ENCOUNTER — Inpatient Hospital Stay: Payer: Medicare Other

## 2020-09-13 ENCOUNTER — Inpatient Hospital Stay (HOSPITAL_BASED_OUTPATIENT_CLINIC_OR_DEPARTMENT_OTHER): Payer: Medicare Other | Admitting: Hematology & Oncology

## 2020-09-13 ENCOUNTER — Inpatient Hospital Stay: Payer: Medicare Other | Attending: Hematology & Oncology

## 2020-09-13 ENCOUNTER — Encounter: Payer: Self-pay | Admitting: Hematology & Oncology

## 2020-09-13 VITALS — BP 189/90 | HR 72 | Temp 98.1°F | Resp 20 | Wt 156.0 lb

## 2020-09-13 DIAGNOSIS — N1831 Chronic kidney disease, stage 3a: Secondary | ICD-10-CM

## 2020-09-13 DIAGNOSIS — D509 Iron deficiency anemia, unspecified: Secondary | ICD-10-CM | POA: Insufficient documentation

## 2020-09-13 DIAGNOSIS — Z794 Long term (current) use of insulin: Secondary | ICD-10-CM | POA: Insufficient documentation

## 2020-09-13 DIAGNOSIS — D631 Anemia in chronic kidney disease: Secondary | ICD-10-CM | POA: Diagnosis present

## 2020-09-13 DIAGNOSIS — N189 Chronic kidney disease, unspecified: Secondary | ICD-10-CM | POA: Diagnosis present

## 2020-09-13 DIAGNOSIS — E119 Type 2 diabetes mellitus without complications: Secondary | ICD-10-CM | POA: Insufficient documentation

## 2020-09-13 DIAGNOSIS — D501 Sideropenic dysphagia: Secondary | ICD-10-CM

## 2020-09-13 LAB — IRON AND TIBC
Iron: 107 ug/dL (ref 41–142)
Saturation Ratios: 39 % (ref 21–57)
TIBC: 273 ug/dL (ref 236–444)
UIBC: 165 ug/dL (ref 120–384)

## 2020-09-13 LAB — CBC WITH DIFFERENTIAL (CANCER CENTER ONLY)
Abs Immature Granulocytes: 0.11 10*3/uL — ABNORMAL HIGH (ref 0.00–0.07)
Basophils Absolute: 0.1 10*3/uL (ref 0.0–0.1)
Basophils Relative: 1 %
Eosinophils Absolute: 0.1 10*3/uL (ref 0.0–0.5)
Eosinophils Relative: 1 %
HCT: 41.1 % (ref 36.0–46.0)
Hemoglobin: 12.6 g/dL (ref 12.0–15.0)
Immature Granulocytes: 1 %
Lymphocytes Relative: 16 %
Lymphs Abs: 1.3 10*3/uL (ref 0.7–4.0)
MCH: 29.6 pg (ref 26.0–34.0)
MCHC: 30.7 g/dL (ref 30.0–36.0)
MCV: 96.7 fL (ref 80.0–100.0)
Monocytes Absolute: 0.7 10*3/uL (ref 0.1–1.0)
Monocytes Relative: 8 %
Neutro Abs: 5.9 10*3/uL (ref 1.7–7.7)
Neutrophils Relative %: 73 %
Platelet Count: 233 10*3/uL (ref 150–400)
RBC: 4.25 MIL/uL (ref 3.87–5.11)
RDW: 15.2 % (ref 11.5–15.5)
WBC Count: 8.2 10*3/uL (ref 4.0–10.5)
nRBC: 0 % (ref 0.0–0.2)

## 2020-09-13 LAB — RETICULOCYTES
Immature Retic Fract: 5.7 % (ref 2.3–15.9)
RBC.: 4.19 MIL/uL (ref 3.87–5.11)
Retic Count, Absolute: 36.5 10*3/uL (ref 19.0–186.0)
Retic Ct Pct: 0.9 % (ref 0.4–3.1)

## 2020-09-13 LAB — CMP (CANCER CENTER ONLY)
ALT: 21 U/L (ref 0–44)
AST: 21 U/L (ref 15–41)
Albumin: 4 g/dL (ref 3.5–5.0)
Alkaline Phosphatase: 56 U/L (ref 38–126)
Anion gap: 8 (ref 5–15)
BUN: 39 mg/dL — ABNORMAL HIGH (ref 8–23)
CO2: 27 mmol/L (ref 22–32)
Calcium: 10 mg/dL (ref 8.9–10.3)
Chloride: 103 mmol/L (ref 98–111)
Creatinine: 1.27 mg/dL — ABNORMAL HIGH (ref 0.44–1.00)
GFR, Estimated: 47 mL/min — ABNORMAL LOW (ref >60)
Glucose, Bld: 250 mg/dL — ABNORMAL HIGH (ref 70–99)
Potassium: 5.9 mmol/L — ABNORMAL HIGH (ref 3.5–5.1)
Sodium: 138 mmol/L (ref 135–145)
Total Bilirubin: 0.2 mg/dL — ABNORMAL LOW (ref 0.3–1.2)
Total Protein: 6.9 g/dL (ref 6.5–8.1)

## 2020-09-13 LAB — FERRITIN: Ferritin: 750 ng/mL — ABNORMAL HIGH (ref 11–307)

## 2020-09-13 NOTE — Telephone Encounter (Signed)
appts made per 09/13/20 los and pt placed in phone   Kelly Soto

## 2020-09-13 NOTE — Progress Notes (Signed)
Hematology and Oncology Follow Up Visit  Kelly Soto [id number] September 24, 1955 65 y.o. 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  Kelly Soto is back for follow-up.  She feels quite good.  Her mother is having her breast cancer surgery tomorrow.  [name] really needs to do quite a bit for her since her mother has dementia.  Her hemoglobin was doing quite nicely today.  She will not need a Retacrit injection which I am happy about.  Back in February, her ferritin was 1500 with iron saturation of 25%.  The elevated ferritin is more inflammatory.  She says her fasting blood sugar this morning was 127.  She has had no problems with bowels or bladder.  She has had no cough or shortness of breath.  She has had no rashes.  She has had no leg swelling.  Overall, her performance status is ECOG 1.     Medications:  Current Outpatient Medications:  .  Ascorbic Acid (VITAMIN C) 1000 MG tablet, Take 1,000 mg by mouth daily., Disp: , Rfl:  .  baclofen (LIORESAL) 10 MG tablet, 1 tablet with food or milk as needed, Disp: , Rfl:  .  BD PEN NEEDLE NANO U/F 32G X 4 MM MISC, USE 1 ONCE DAILY WITH TRESIBA, Disp: , Rfl:  .  betamethasone valerate (VALISONE) 0.1 % cream, Apply 1 application topically daily as needed (irritation)., Disp: , Rfl:  .  Blood Glucose Monitoring Suppl (CONTOUR NEXT EZ) w/Device KIT, USE TO CHECK BLOOD SUGARS DAILY, Disp: , Rfl:  .  CONTOUR NEXT TEST test strip, USE STRIP TO CHECK GLUCOSE ONCE DAILY, Disp: , Rfl:  .  denosumab (PROLIA) 60 MG/ML SOLN injection, Inject 60 mg into the skin every 6 (six) months. Administer in upper arm, thigh, or abdomen  Next  injection is due 05-2016, Disp: , Rfl:  .  diazepam (VALIUM) 5 MG tablet, Take 5 mg by mouth daily as needed for anxiety. , Disp: , Rfl: 0 .  fluconazole (DIFLUCAN) 150 MG tablet, Take 150 mg by mouth as needed., Disp: , Rfl:  .  fluticasone (FLONASE) 50 MCG/ACT nasal spray, Place 2 sprays into both nostrils at bedtime., Disp: , Rfl:  .  Galcanezumab-gnlm (EMGALITY) 120 MG/ML SOAJ, Inject 120 mg into the skin every 30 (thirty) days., Disp: , Rfl:  .  glyBURIDE-metformin (GLUCOVANCE) 2.5-500 MG per tablet, Take 2 tablets by mouth 2 (two) times daily., Disp: , Rfl:  .  hydroxypropyl methylcellulose / hypromellose (ISOPTO TEARS / GONIOVISC) 2.5 % ophthalmic solution, Place 1 drop into both eyes 3 (three) times daily as needed for dry eyes. , Disp: , Rfl:  .  metoprolol succinate (TOPROL-XL) 50 MG 24 hr tablet, Take 50 mg by mouth every morning., Disp: , Rfl:  .  Microlet Lancets MISC, USE AS DIRECTED TO CHECK BLOOD SUGAR, Disp: , Rfl:  .  Multiple Vitamin (MULTI-VITAMINS) TABS, Take 1 tablet by mouth daily. , Disp: , Rfl:  .  omeprazole (PRILOSEC) 40 MG capsule, Take 40 mg by mouth 2 (two) times daily., Disp: , Rfl:  .  pioglitazone (ACTOS) 45 MG tablet, Take 45 mg by mouth daily., Disp: , Rfl:  .  Simethicone (GAS FREE EXTRA STRENGTH PO), Take 2 tablets by mouth at bedtime., Disp: , Rfl:  .  sodium chloride (OCEAN) 0.65 % nasal spray, 2 sprays, Disp: , Rfl:  .  tobramycin (TOBREX) 0.3 % ophthalmic solution, Place 1 drop into both eyes See admin instructions. Instill one drop into the affected eye four times a day prior and the day after eye injection., Disp: , Rfl:  .  TRESIBA FLEXTOUCH 100 UNIT/ML SOPN FlexTouch Pen, Inject 12 Units into the skin daily. , Disp: , Rfl:  .  triamcinolone (KENALOG) 0.147 MG/GM topical spray, 1 application, Disp: , Rfl:  .  ondansetron (ZOFRAN) 4 MG tablet, Take 1 tablet (4 mg total) by mouth every 6 (six) hours as needed for nausea., Disp: 30 tablet, Rfl:  0  Allergies:  Allergies  Allergen Reactions  . Ace Inhibitors Other (See Comments) and Anaphylaxis    Angioedema  . Aspirin Other (See Comments)    Cannot take due to gastric bypass surgery  . Nsaids Anaphylaxis and Other (See Comments)    S/p gastric bypass Cannot take orally due to gastric bypass S/p gastric bypass  . Sular [Nisoldipine Er] Other (See Comments)    angioedema  . Sular [Nisoldipine] Swelling  . Boniva [Ibandronic Acid] Other (See Comments)    Joint pain    Past Medical History, Surgical history, Social history, and Family History were reviewed and updated.  Review of Systems: Review of Systems  Constitutional: Negative.   HENT: Negative.   Eyes: Negative.   Respiratory: Negative.   Cardiovascular: Negative.   Gastrointestinal: Negative.   Genitourinary: Negative.   Musculoskeletal: Negative.   Skin: Negative.   Neurological: Negative.   Endo/Heme/Allergies: Negative.   Psychiatric/Behavioral: Negative.     Physical Exam:  weight is 156 lb (70.8 kg). Her oral temperature is 98.1 F (36.7 C). Her blood pressure is 189/90 (abnormal) and her pulse is 72. Her respiration is 20 and oxygen saturation is 100%.   Physical Exam Vitals reviewed.  HENT:     Head: Normocephalic and atraumatic.  Eyes:     Pupils: Pupils are equal, round, and reactive to light.  Cardiovascular:     Rate and Rhythm: Normal rate and regular rhythm.     Heart sounds: Normal heart sounds.  Pulmonary:     Effort: Pulmonary effort is normal.     Breath sounds: Normal breath sounds.  Abdominal:     General: Bowel sounds are normal.     Palpations: Abdomen is soft.  Musculoskeletal:        General: No tenderness or deformity. Normal range of motion.     Cervical back: Normal range of motion.  Lymphadenopathy:     Cervical: No cervical adenopathy.  Skin:    General: Skin is warm and dry.     Findings: No erythema or rash.  Neurological:     Mental Status: She is alert and  oriented to person, place, and time.  Psychiatric:        Behavior: Behavior normal.        Thought Content: Thought content normal.        Judgment: Judgment normal.      Lab Results  Component Value Date   WBC 8.2 09/13/2020   HGB 12.6 09/13/2020   HCT 41.1 09/13/2020   MCV 96.7 09/13/2020   PLT 233 09/13/2020     Chemistry      Component Value Date/Time   NA 138 08/16/2020 0741   NA 145  05/10/2017 0748   NA 137 12/06/2016 0746   K 5.5 (H) 08/16/2020 0741   K 4.7 05/10/2017 0748   K 5.2 (H) 12/06/2016 0746   CL 103 08/16/2020 0741   CL 110 (H) 05/10/2017 0748   CO2 27 08/16/2020 0741   CO2 23 05/10/2017 0748   CO2 23 12/06/2016 0746   BUN 31 (H) 08/16/2020 0741   BUN 28 (H) 05/10/2017 0748   BUN 26.1 (H) 12/06/2016 0746   CREATININE 1.19 (H) 08/16/2020 0741   CREATININE 1.2 05/10/2017 0748   CREATININE 1.2 (H) 12/06/2016 0746      Component Value Date/Time   CALCIUM 10.1 08/16/2020 0741   CALCIUM 9.1 05/10/2017 0748   CALCIUM 9.1 12/06/2016 0746   ALKPHOS 67 08/16/2020 0741   ALKPHOS 61 05/10/2017 0748   ALKPHOS 41 12/06/2016 0746   AST 25 08/16/2020 0741   AST 23 12/06/2016 0746   ALT 28 08/16/2020 0741   ALT 31 05/10/2017 0748   ALT 26 12/06/2016 0746   BILITOT 0.3 08/16/2020 0741   BILITOT 0.26 12/06/2016 0746       Impression and Plan: Ms. Steelman is 65 year old white female. She has diabetes. She has iron deficiency anemia. She has a very low erythropoietin level.  We will hold off on Retacrit today.  I am just happy that her hemoglobin is doing so nicely.  We will plan to get her back in another month or so.  I want try to get her back before the Easter holiday just to make sure that her blood is doing well so she can enjoy Easter with her family.    Volanda Napoleon, MD 3/21/20228:24 AM

## 2020-09-23 IMAGING — MG DIGITAL SCREENING BILAT W/ TOMO W/ CAD
8 series · 9 of 24 positions shown · non-contrast
Comparison: Previous exam(s).

CLINICAL DATA: Screening.

EXAM:
DIGITAL SCREENING BILATERAL MAMMOGRAM WITH TOMO AND CAD

[L MLO synth-2D]
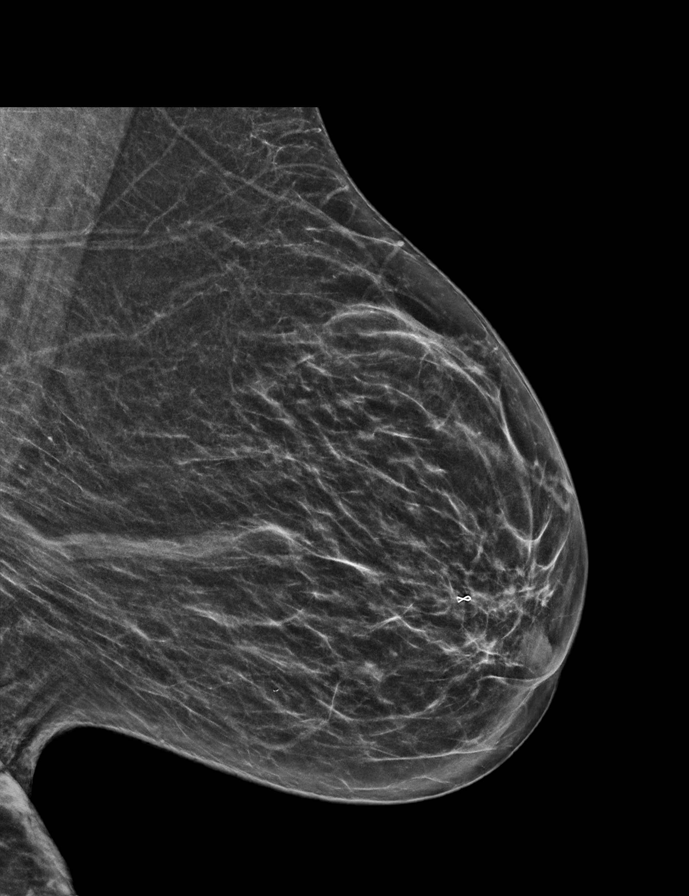

[L CC synth-2D]
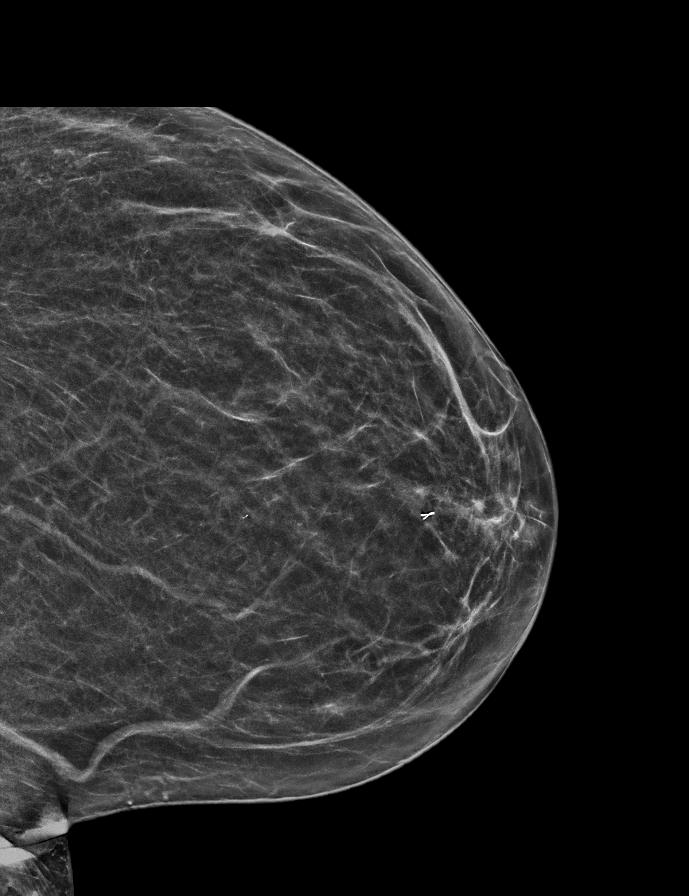

[R MLO synth-2D]
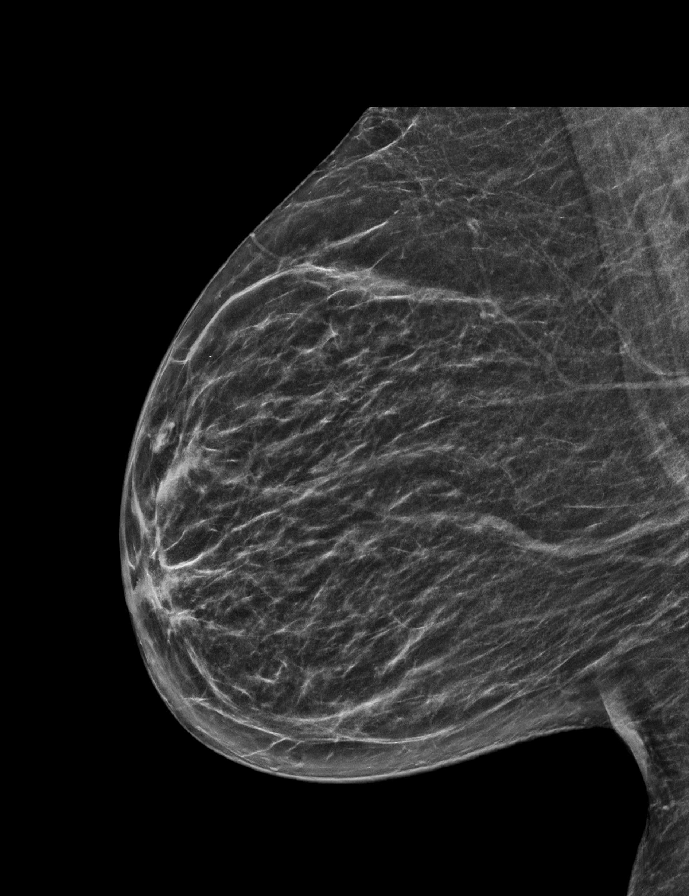

[R CC synth-2D]
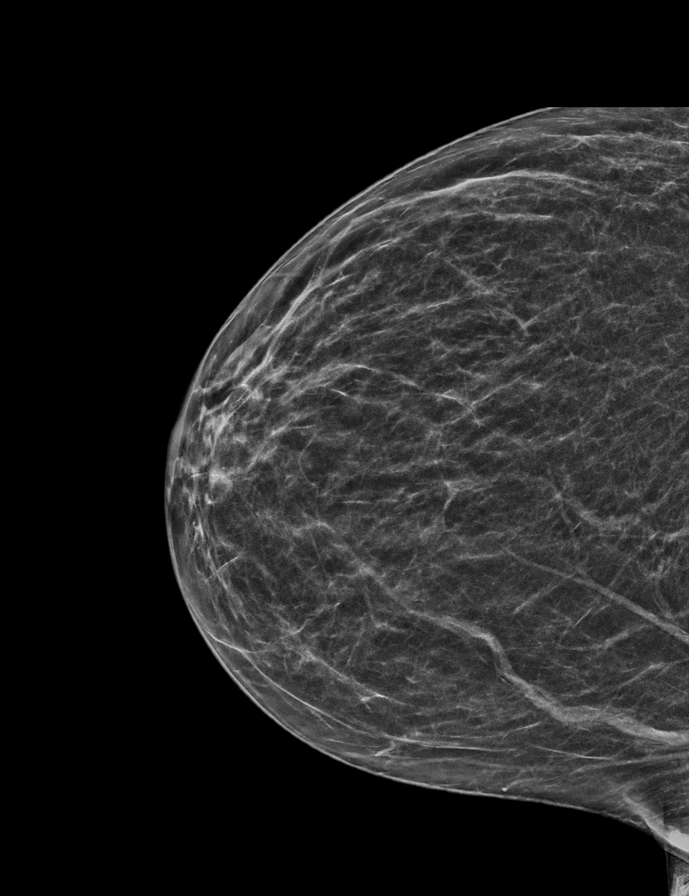

[L MLO tomo · 2 of 55 frames shown]
[frame 18/55]
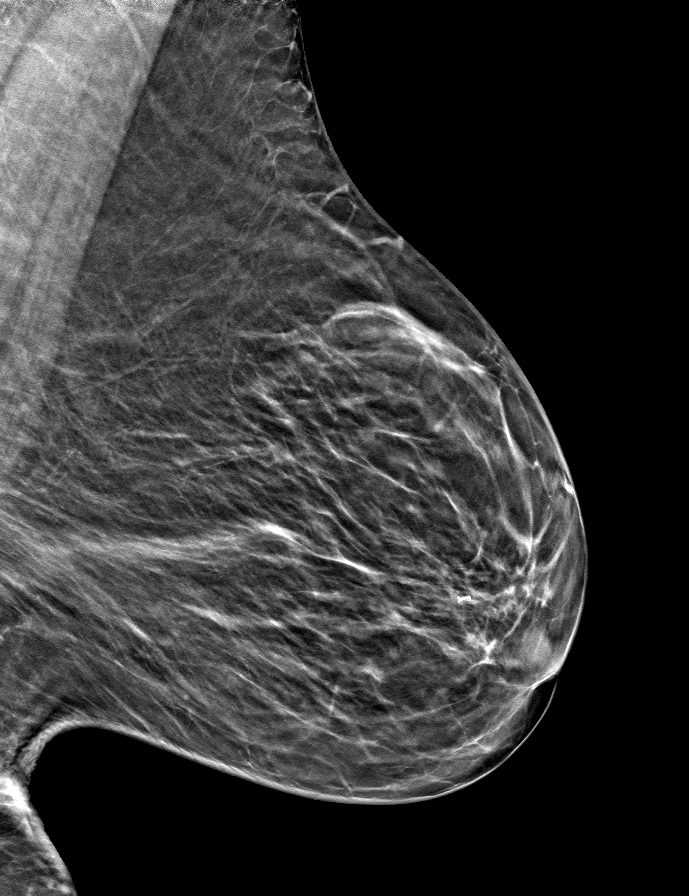
[frame 28/55]
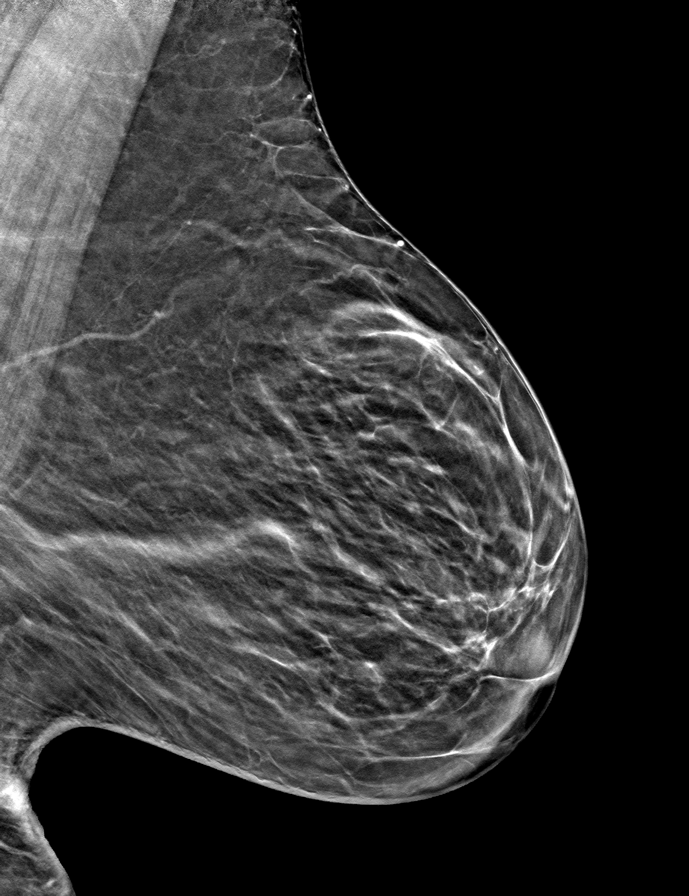

[R MLO tomo · tomo slice 27/53.0]
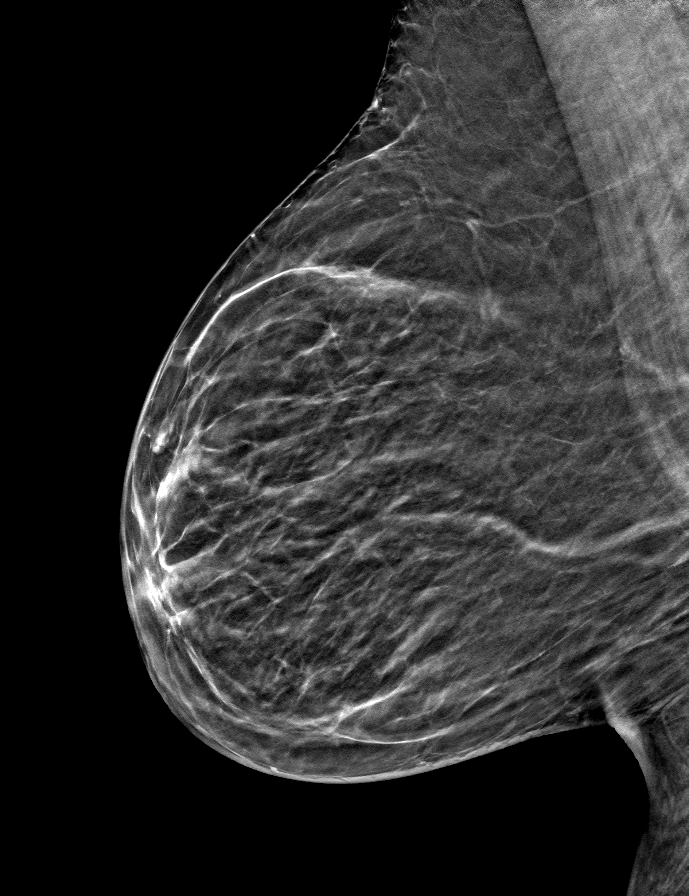

[L CC tomo · tomo slice 27/52.0]
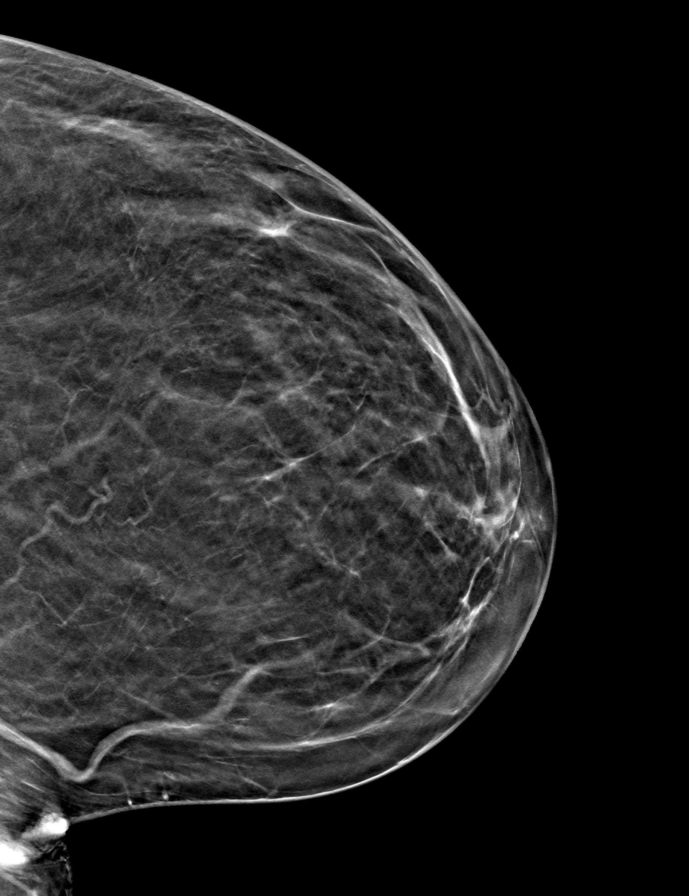

[R CC tomo · tomo slice 25/50.0]
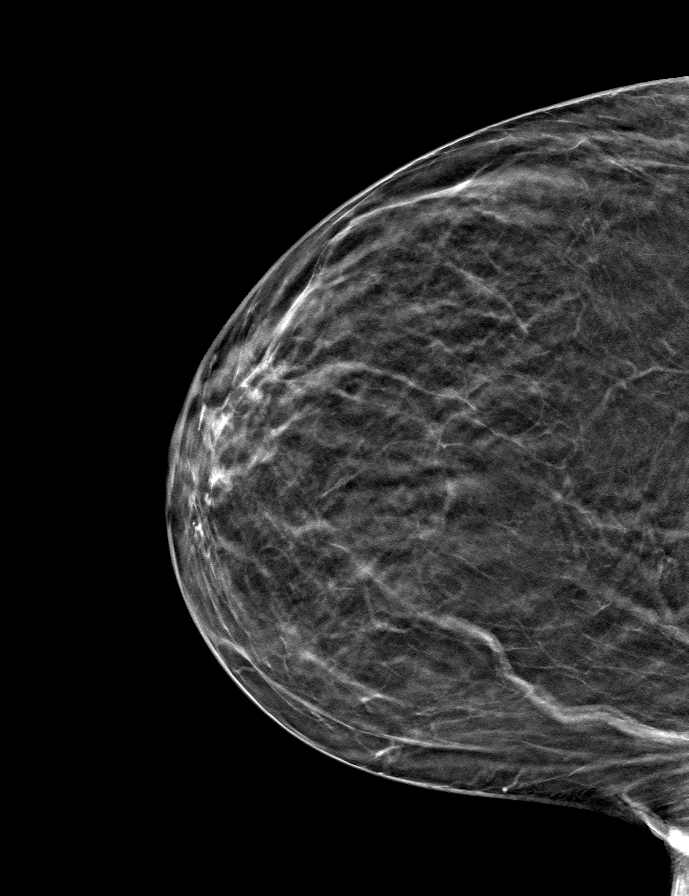

[9 of 24 positions shown; findings below may reference images not displayed]

ACR Breast Density Category b: There are scattered areas of
fibroglandular density.
FINDINGS: There are no findings suspicious for malignancy. Images were
processed with CAD.
IMPRESSION: No mammographic evidence of malignancy. A result letter of this
screening mammogram will be mailed directly to the patient.

RECOMMENDATION:
Screening mammogram in one year. (Code:[id number])

BI-RADS CATEGORY  1: Negative.

## 2020-10-05 ENCOUNTER — Encounter: Payer: Self-pay | Admitting: Hematology & Oncology

## 2020-10-05 ENCOUNTER — Inpatient Hospital Stay: Payer: Medicare Other | Attending: Hematology & Oncology

## 2020-10-05 ENCOUNTER — Other Ambulatory Visit: Payer: Self-pay

## 2020-10-05 ENCOUNTER — Inpatient Hospital Stay: Payer: Medicare Other

## 2020-10-05 ENCOUNTER — Inpatient Hospital Stay (HOSPITAL_BASED_OUTPATIENT_CLINIC_OR_DEPARTMENT_OTHER): Payer: Medicare Other | Admitting: Hematology & Oncology

## 2020-10-05 ENCOUNTER — Telehealth: Payer: Self-pay

## 2020-10-05 VITALS — BP 168/89 | HR 66 | Temp 98.1°F | Resp 20 | Wt 154.0 lb

## 2020-10-05 DIAGNOSIS — D631 Anemia in chronic kidney disease: Secondary | ICD-10-CM

## 2020-10-05 DIAGNOSIS — N189 Chronic kidney disease, unspecified: Secondary | ICD-10-CM | POA: Diagnosis not present

## 2020-10-05 DIAGNOSIS — Z794 Long term (current) use of insulin: Secondary | ICD-10-CM | POA: Insufficient documentation

## 2020-10-05 DIAGNOSIS — E119 Type 2 diabetes mellitus without complications: Secondary | ICD-10-CM | POA: Insufficient documentation

## 2020-10-05 DIAGNOSIS — N1831 Chronic kidney disease, stage 3a: Secondary | ICD-10-CM

## 2020-10-05 DIAGNOSIS — D509 Iron deficiency anemia, unspecified: Secondary | ICD-10-CM | POA: Diagnosis not present

## 2020-10-05 DIAGNOSIS — E875 Hyperkalemia: Secondary | ICD-10-CM | POA: Diagnosis not present

## 2020-10-05 LAB — CBC WITH DIFFERENTIAL (CANCER CENTER ONLY)
Abs Immature Granulocytes: 0.1 10*3/uL — ABNORMAL HIGH (ref 0.00–0.07)
Basophils Absolute: 0 10*3/uL (ref 0.0–0.1)
Basophils Relative: 1 %
Eosinophils Absolute: 0.1 10*3/uL (ref 0.0–0.5)
Eosinophils Relative: 2 %
HCT: 36.3 % (ref 36.0–46.0)
Hemoglobin: 11.6 g/dL — ABNORMAL LOW (ref 12.0–15.0)
Immature Granulocytes: 1 %
Lymphocytes Relative: 24 %
Lymphs Abs: 1.8 10*3/uL (ref 0.7–4.0)
MCH: 30.2 pg (ref 26.0–34.0)
MCHC: 32 g/dL (ref 30.0–36.0)
MCV: 94.5 fL (ref 80.0–100.0)
Monocytes Absolute: 0.5 10*3/uL (ref 0.1–1.0)
Monocytes Relative: 7 %
Neutro Abs: 4.9 10*3/uL (ref 1.7–7.7)
Neutrophils Relative %: 65 %
Platelet Count: 202 10*3/uL (ref 150–400)
RBC: 3.84 MIL/uL — ABNORMAL LOW (ref 3.87–5.11)
RDW: 13.9 % (ref 11.5–15.5)
WBC Count: 7.4 10*3/uL (ref 4.0–10.5)
nRBC: 0 % (ref 0.0–0.2)

## 2020-10-05 LAB — IRON AND TIBC
Iron: 151 ug/dL — ABNORMAL HIGH (ref 41–142)
Saturation Ratios: 60 % — ABNORMAL HIGH (ref 21–57)
TIBC: 253 ug/dL (ref 236–444)
UIBC: 101 ug/dL — ABNORMAL LOW (ref 120–384)

## 2020-10-05 LAB — CMP (CANCER CENTER ONLY)
ALT: 18 U/L (ref 0–44)
AST: 20 U/L (ref 15–41)
Albumin: 3.9 g/dL (ref 3.5–5.0)
Alkaline Phosphatase: 42 U/L (ref 38–126)
Anion gap: 7 (ref 5–15)
BUN: 35 mg/dL — ABNORMAL HIGH (ref 8–23)
CO2: 27 mmol/L (ref 22–32)
Calcium: 9.8 mg/dL (ref 8.9–10.3)
Chloride: 104 mmol/L (ref 98–111)
Creatinine: 1.15 mg/dL — ABNORMAL HIGH (ref 0.44–1.00)
GFR, Estimated: 53 mL/min — ABNORMAL LOW (ref >60)
Glucose, Bld: 139 mg/dL — ABNORMAL HIGH (ref 70–99)
Potassium: 5.6 mmol/L — ABNORMAL HIGH (ref 3.5–5.1)
Sodium: 138 mmol/L (ref 135–145)
Total Bilirubin: 0.3 mg/dL (ref 0.3–1.2)
Total Protein: 6.7 g/dL (ref 6.5–8.1)

## 2020-10-05 LAB — RETICULOCYTES
Immature Retic Fract: 2.3 % (ref 2.3–15.9)
RBC.: 3.92 MIL/uL (ref 3.87–5.11)
Retic Count, Absolute: 31 10*3/uL (ref 19.0–186.0)
Retic Ct Pct: 0.8 % (ref 0.4–3.1)

## 2020-10-05 LAB — FERRITIN: Ferritin: 949 ng/mL — ABNORMAL HIGH (ref 11–307)

## 2020-10-05 NOTE — Telephone Encounter (Signed)
appts made for pt per 10/05/20 los   Kelly Soto

## 2020-10-05 NOTE — Progress Notes (Signed)
Hematology and Oncology Follow Up Visit  Kelly Soto [id number] [birth year] 65 y.o. 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 is back for follow-up.  She is doing quite nicely.  She is getting ready for Easter.  She is also getting ready to go on a vacation in May for her anniversary.  She has had no problems with bleeding.  There is been no nausea or vomiting.  She has had no change in bowel or bladder habits.  When we last saw her back in March, her ferritin was 750 with iron saturation of 39%.  She has had no problems with rashes.  There is been no leg swelling.  She was has hyperkalemia.  She has was suspect is RTA from her diabetes.  I did actually see her yesterday.  She came in with her mother, who actually has early stage breast cancer.  Overall, her performance status is ECOG 1.     Medications:  Current Outpatient Medications:  .  Ascorbic Acid (VITAMIN C) 1000 MG tablet, Take 1,000 mg by mouth daily., Disp: , Rfl:  .  baclofen (LIORESAL) 10 MG tablet, 1 tablet with food or milk as needed, Disp: , Rfl:  .  BD PEN NEEDLE NANO U/F 32G X 4 MM MISC, USE 1 ONCE DAILY WITH TRESIBA, Disp: , Rfl:  .  betamethasone valerate (VALISONE) 0.1 % cream, Apply 1 application topically daily as needed (irritation)., Disp: , Rfl:  .  Blood Glucose Monitoring Suppl (CONTOUR NEXT EZ) w/Device KIT, USE TO CHECK BLOOD SUGARS DAILY, Disp: , Rfl:  .  CONTOUR NEXT TEST test strip, USE STRIP TO CHECK GLUCOSE ONCE DAILY, Disp: , Rfl:  .  denosumab (PROLIA) 60 MG/ML SOLN injection, Inject 60 mg into the skin every 6 (six) months. Administer in upper arm, thigh, or abdomen   Next injection is due 05-2016, Disp: , Rfl:  .  diazepam (VALIUM) 5 MG tablet, Take 5 mg by mouth daily as needed for anxiety. , Disp: , Rfl: 0 .  fluconazole (DIFLUCAN) 150 MG tablet, Take 150 mg by mouth as needed., Disp: , Rfl:  .  fluticasone (FLONASE) 50 MCG/ACT nasal spray, Place 2 sprays into both nostrils at bedtime., Disp: , Rfl:  .  Galcanezumab-gnlm (EMGALITY) 120 MG/ML SOAJ, Inject 120 mg into the skin every 30 (thirty) days., Disp: , Rfl:  .  glyBURIDE-metformin (GLUCOVANCE) 2.5-500 MG per tablet, Take 2 tablets by mouth 2 (two) times daily., Disp: , Rfl:  .  hydroxypropyl methylcellulose / hypromellose (ISOPTO TEARS / GONIOVISC) 2.5 % ophthalmic solution, Place 1 drop into both eyes 3 (three) times daily as needed for dry eyes. , Disp: , Rfl:  .  metoprolol succinate (TOPROL-XL) 50 MG 24 hr tablet, Take 100 mg by mouth every morning., Disp: , Rfl:  .  Microlet Lancets MISC, USE AS DIRECTED TO CHECK BLOOD SUGAR, Disp: , Rfl:  .  Multiple Vitamin (MULTI-VITAMINS) TABS, Take 1 tablet by mouth daily. , Disp: , Rfl:  .  omeprazole (PRILOSEC) 40 MG capsule, Take 40 mg by mouth 2 (two) times daily., Disp: , Rfl:  .  pioglitazone (ACTOS) 45 MG tablet, Take  45 mg by mouth daily., Disp: , Rfl:  .  Simethicone (GAS FREE EXTRA STRENGTH PO), Take 2 tablets by mouth at bedtime., Disp: , Rfl:  .  sodium chloride (OCEAN) 0.65 % nasal spray, 2 sprays, Disp: , Rfl:  .  tobramycin (TOBREX) 0.3 % ophthalmic solution, Place 1 drop into both eyes See admin instructions. Instill one drop into the affected eye four times a day prior and the day after eye injection., Disp: , Rfl:  .  TRESIBA FLEXTOUCH 100 UNIT/ML SOPN FlexTouch Pen, Inject 12 Units into the skin daily. , Disp: , Rfl:  .  triamcinolone (KENALOG) 0.147 MG/GM topical spray, 1 application, Disp: , Rfl:  .  ondansetron (ZOFRAN) 4 MG tablet, Take 1 tablet (4 mg total) by mouth every 6 (six) hours as needed for nausea., Disp: 30 tablet, Rfl:  0  Allergies:  Allergies  Allergen Reactions  . Ace Inhibitors Other (See Comments) and Anaphylaxis    Angioedema  . Aspirin Other (See Comments)    Cannot take due to gastric bypass surgery  . Nsaids Anaphylaxis and Other (See Comments)    S/p gastric bypass Cannot take orally due to gastric bypass S/p gastric bypass  . Sular [Nisoldipine Er] Other (See Comments)    angioedema  . Sular [Nisoldipine] Swelling  . Boniva [Ibandronic Acid] Other (See Comments)    Joint pain    Past Medical History, Surgical history, Social history, and Family History were reviewed and updated.  Review of Systems: Review of Systems  Constitutional: Negative.   HENT: Negative.   Eyes: Negative.   Respiratory: Negative.   Cardiovascular: Negative.   Gastrointestinal: Negative.   Genitourinary: Negative.   Musculoskeletal: Negative.   Skin: Negative.   Neurological: Negative.   Endo/Heme/Allergies: Negative.   Psychiatric/Behavioral: Negative.     Physical Exam:  weight is 154 lb (69.9 kg). Her oral temperature is 98.1 F (36.7 C). Her blood pressure is 168/89 (abnormal) and her pulse is 66. Her respiration is 20 and oxygen saturation is 100%.   Physical Exam Vitals reviewed.  HENT:     Head: Normocephalic and atraumatic.  Eyes:     Pupils: Pupils are equal, round, and reactive to light.  Cardiovascular:     Rate and Rhythm: Normal rate and regular rhythm.     Heart sounds: Normal heart sounds.  Pulmonary:     Effort: Pulmonary effort is normal.     Breath sounds: Normal breath sounds.  Abdominal:     General: Bowel sounds are normal.     Palpations: Abdomen is soft.  Musculoskeletal:        General: No tenderness or deformity. Normal range of motion.     Cervical back: Normal range of motion.  Lymphadenopathy:     Cervical: No cervical adenopathy.  Skin:    General: Skin is warm and dry.     Findings: No erythema or rash.  Neurological:     Mental Status: She is alert and  oriented to person, place, and time.  Psychiatric:        Behavior: Behavior normal.        Thought Content: Thought content normal.        Judgment: Judgment normal.      Lab Results  Component Value Date   WBC 7.4 10/05/2020   HGB 11.6 (L) 10/05/2020   HCT 36.3 10/05/2020   MCV 94.5 10/05/2020   PLT 202 10/05/2020     Chemistry      Component  Value Date/Time   NA 138 10/05/2020 0742   NA 145 05/10/2017 0748   NA 137 12/06/2016 0746   K 5.6 (H) 10/05/2020 0742   K 4.7 05/10/2017 0748   K 5.2 (H) 12/06/2016 0746   CL 104 10/05/2020 0742   CL 110 (H) 05/10/2017 0748   CO2 27 10/05/2020 0742   CO2 23 05/10/2017 0748   CO2 23 12/06/2016 0746   BUN 35 (H) 10/05/2020 0742   BUN 28 (H) 05/10/2017 0748   BUN 26.1 (H) 12/06/2016 0746   CREATININE 1.15 (H) 10/05/2020 0742   CREATININE 1.2 05/10/2017 0748   CREATININE 1.2 (H) 12/06/2016 0746      Component Value Date/Time   CALCIUM 9.8 10/05/2020 0742   CALCIUM 9.1 05/10/2017 0748   CALCIUM 9.1 12/06/2016 0746   ALKPHOS 42 10/05/2020 0742   ALKPHOS 61 05/10/2017 0748   ALKPHOS 41 12/06/2016 0746   AST 20 10/05/2020 0742   AST 23 12/06/2016 0746   ALT 18 10/05/2020 0742   ALT 31 05/10/2017 0748   ALT 26 12/06/2016 0746   BILITOT 0.3 10/05/2020 0742   BILITOT 0.26 12/06/2016 0746       Impression and Plan: Ms. Sarratt is 65 year old white female. She has diabetes. She has iron deficiency anemia. She has a very low erythropoietin level.  We will hold off on Retacrit today.  I am just happy that her hemoglobin is doing so nicely.  We will plan to get her back in another month or so.  I want to make sure we get her back before she goes on her vacation in May.  I suspect that we probably will need to do treatment at that point.    Volanda Napoleon, MD 4/12/20228:26 AM

## 2020-10-27 ENCOUNTER — Ambulatory Visit (INDEPENDENT_AMBULATORY_CARE_PROVIDER_SITE_OTHER): Payer: Medicare Other

## 2020-10-27 ENCOUNTER — Encounter: Payer: Self-pay | Admitting: Orthopedic Surgery

## 2020-10-27 ENCOUNTER — Other Ambulatory Visit: Payer: Self-pay

## 2020-10-27 ENCOUNTER — Ambulatory Visit (INDEPENDENT_AMBULATORY_CARE_PROVIDER_SITE_OTHER): Payer: Medicare Other | Admitting: Orthopedic Surgery

## 2020-10-27 DIAGNOSIS — M25461 Effusion, right knee: Secondary | ICD-10-CM | POA: Diagnosis not present

## 2020-10-29 ENCOUNTER — Other Ambulatory Visit: Payer: Self-pay | Admitting: Surgical

## 2020-10-29 ENCOUNTER — Telehealth: Payer: Self-pay | Admitting: Orthopedic Surgery

## 2020-10-29 MED ORDER — TRAMADOL HCL 50 MG PO TABS: 50.0000 mg | ORAL_TABLET | Freq: Every evening | ORAL | 0 refills | Status: DC | PRN

## 2020-10-29 NOTE — Telephone Encounter (Signed)
Pt called wondering if she could get the pain medication she thinks it was tramadol called in. [facility] on [address] please and thank you.

## 2020-10-29 NOTE — Telephone Encounter (Signed)
Sent in, this should be last tramadol RX

## 2020-10-30 ENCOUNTER — Encounter: Payer: Self-pay | Admitting: Orthopedic Surgery

## 2020-10-30 MED ORDER — TRAMADOL HCL 50 MG PO TABS: 50.0000 mg | ORAL_TABLET | Freq: Four times a day (QID) | ORAL | 0 refills | Status: DC | PRN

## 2020-10-30 NOTE — Progress Notes (Signed)
Post-Op Visit Note   Patient: Kelly Soto           Date of Birth: 14-Jun-1956           MRN: [id number] Visit Date: 10/27/2020 PCP: [name], MD   Assessment & Plan:  Chief Complaint:  Chief Complaint  Patient presents with  . Right Knee - Pain  . Other    Follow up   Visit Diagnoses:  1. Effusion, right knee     Plan: Kelly Soto is a 65 year old patient who is now about 6 months out right knee unicompartmental replacement.  She has been doing reasonably well but she feels like the knee is giving way and she still is having more pain than she would expect at this time.  Reports buckling stiffness and tightness.  She did do physical therapy.  She is doing the bike.  Never really locks up but she does report some buckling with the right knee.  Has occasional nights about 4 or 5 nights out of 7 where she wakes with pain that radiates down to the ankle.  Denies any significant back pain.  Denies any fevers or chills.  Tylenol is not helpful.  Denies any low back pain.  On examination she has full range of motion with very good stability to varus and valgus stress.  Unlikely that there is enough laxity in this knee that the bearing is spinning out.  Plan is Ultram to use at night 52-month return.  At this time the compartments do not look loose radiographically and there does not appear to be any infection.  Hard to say exactly what the cause of the symptoms are.  Repeat x-rays in 4 months.  Follow-Up Instructions: Return in about 4 months (around 02/27/2021).   Orders:  Orders Placed This Encounter  Procedures  . XR Knee 1-2 Views Right   No orders of the defined types were placed in this encounter.   Imaging: No results found.  PMFS History: Patient Active Problem List   Diagnosis Date Noted  . 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 Chronic renal insufficiency 09/24/2013  . Anemia of renal disease 09/24/2013  . H/O gastric bypass 08/22/2013  .  Intestinal malabsorption 08/22/2013  . Anemia, iron deficiency 08/22/2013  . Chronic eustachian tube dysfunction 03/28/2011  . Diabetes mellitus ([place]) 03/28/2011   Past Medical History:  Diagnosis Date  . Anemia of renal disease 09/24/2013  . Anxiety   . Arthritis   . Chronic renal insufficiency 09/24/2013  . Diabetes mellitus without complication ([place])    Type II  . Gastroparesis   . GERD (gastroesophageal reflux disease)   . Headache    migrains  . History of hiatal hernia   . Hypertension   . PONV (postoperative nausea and vomiting)   . Sleep apnea    Has not used the Cpap since having gastric bypass surgery.  [name] Ulcer     Family History  Problem Relation Age of Onset  . Atrial fibrillation Mother   . Heart attack Father   . Sudden Cardiac Death Father   . Stroke Maternal Grandmother     Past Surgical History:  Procedure Laterality Date  . ABDOMINAL HYSTERECTOMY  1992  . BREAST BIOPSY Left 03/26/2017   benign  . CARDIAC CATHETERIZATION     83yrs ago- negative  . CARPAL TUNNEL RELEASE Bilateral   . CHOLECYSTECTOMY N/A 05/11/2016   Procedure: LAPAROSCOPIC CHOLECYSTECTOMY;  Surgeon: Erroll Luna, MD;  Location: [facility];  Service: General;  Laterality: N/A;  . EYE SURGERY     Bilateral cataracts  . GASTRIC BYPASS  2009  . PARTIAL KNEE ARTHROPLASTY Right 05/04/2020   Procedure: right partial knee replacement;  Surgeon: Meredith Pel, MD;  Location: [facility];  Service: Orthopedics;  Laterality: Right;   Social History   Occupational History  . Not on file  Tobacco Use  . Smoking status: Never Smoker  . Smokeless tobacco: Never Used  . Tobacco comment: never used tobacco  Vaping Use  . Vaping Use: Never used  Substance and Sexual Activity  . Alcohol use: No    Alcohol/week: 0.0 standard drinks  . Drug use: No  . Sexual activity: Yes

## 2020-11-09 ENCOUNTER — Encounter: Payer: Self-pay | Admitting: Hematology & Oncology

## 2020-11-09 ENCOUNTER — Inpatient Hospital Stay: Payer: Medicare Other

## 2020-11-09 ENCOUNTER — Inpatient Hospital Stay: Payer: Medicare Other | Attending: Hematology & Oncology

## 2020-11-09 ENCOUNTER — Telehealth: Payer: Self-pay

## 2020-11-09 ENCOUNTER — Other Ambulatory Visit: Payer: Self-pay

## 2020-11-09 ENCOUNTER — Inpatient Hospital Stay (HOSPITAL_BASED_OUTPATIENT_CLINIC_OR_DEPARTMENT_OTHER): Payer: Medicare Other | Admitting: Hematology & Oncology

## 2020-11-09 VITALS — BP 164/71 | HR 70 | Temp 98.3°F | Resp 18 | Wt 153.0 lb

## 2020-11-09 DIAGNOSIS — D631 Anemia in chronic kidney disease: Secondary | ICD-10-CM

## 2020-11-09 DIAGNOSIS — N189 Chronic kidney disease, unspecified: Secondary | ICD-10-CM

## 2020-11-09 DIAGNOSIS — N183 Chronic kidney disease, stage 3 unspecified: Secondary | ICD-10-CM | POA: Diagnosis not present

## 2020-11-09 LAB — CBC WITH DIFFERENTIAL (CANCER CENTER ONLY)
Abs Immature Granulocytes: 0.09 10*3/uL — ABNORMAL HIGH (ref 0.00–0.07)
Basophils Absolute: 0 10*3/uL (ref 0.0–0.1)
Basophils Relative: 1 %
Eosinophils Absolute: 0.1 10*3/uL (ref 0.0–0.5)
Eosinophils Relative: 1 %
HCT: 34 % — ABNORMAL LOW (ref 36.0–46.0)
Hemoglobin: 10.6 g/dL — ABNORMAL LOW (ref 12.0–15.0)
Immature Granulocytes: 1 %
Lymphocytes Relative: 21 %
Lymphs Abs: 1.7 10*3/uL (ref 0.7–4.0)
MCH: 29.5 pg (ref 26.0–34.0)
MCHC: 31.2 g/dL (ref 30.0–36.0)
MCV: 94.7 fL (ref 80.0–100.0)
Monocytes Absolute: 0.7 10*3/uL (ref 0.1–1.0)
Monocytes Relative: 8 %
Neutro Abs: 5.7 10*3/uL (ref 1.7–7.7)
Neutrophils Relative %: 68 %
Platelet Count: 233 10*3/uL (ref 150–400)
RBC: 3.59 MIL/uL — ABNORMAL LOW (ref 3.87–5.11)
RDW: 13.4 % (ref 11.5–15.5)
WBC Count: 8.4 10*3/uL (ref 4.0–10.5)
nRBC: 0 % (ref 0.0–0.2)

## 2020-11-09 LAB — CMP (CANCER CENTER ONLY)
ALT: 18 U/L (ref 0–44)
AST: 19 U/L (ref 15–41)
Albumin: 3.9 g/dL (ref 3.5–5.0)
Alkaline Phosphatase: 41 U/L (ref 38–126)
Anion gap: 8 (ref 5–15)
BUN: 35 mg/dL — ABNORMAL HIGH (ref 8–23)
CO2: 27 mmol/L (ref 22–32)
Calcium: 9.6 mg/dL (ref 8.9–10.3)
Chloride: 104 mmol/L (ref 98–111)
Creatinine: 1.16 mg/dL — ABNORMAL HIGH (ref 0.44–1.00)
GFR, Estimated: 52 mL/min — ABNORMAL LOW (ref >60)
Glucose, Bld: 120 mg/dL — ABNORMAL HIGH (ref 70–99)
Potassium: 5.5 mmol/L — ABNORMAL HIGH (ref 3.5–5.1)
Sodium: 139 mmol/L (ref 135–145)
Total Bilirubin: 0.2 mg/dL — ABNORMAL LOW (ref 0.3–1.2)
Total Protein: 6.4 g/dL — ABNORMAL LOW (ref 6.5–8.1)

## 2020-11-09 LAB — RETICULOCYTES
Immature Retic Fract: 9.6 % (ref 2.3–15.9)
RBC.: 3.61 MIL/uL — ABNORMAL LOW (ref 3.87–5.11)
Retic Count, Absolute: 47.7 10*3/uL (ref 19.0–186.0)
Retic Ct Pct: 1.3 % (ref 0.4–3.1)

## 2020-11-09 LAB — IRON AND TIBC
Iron: 121 ug/dL (ref 41–142)
Saturation Ratios: 47 % (ref 21–57)
TIBC: 259 ug/dL (ref 236–444)
UIBC: 137 ug/dL (ref 120–384)

## 2020-11-09 LAB — FERRITIN: Ferritin: 967 ng/mL — ABNORMAL HIGH (ref 11–307)

## 2020-11-09 MED ORDER — DARBEPOETIN ALFA 300 MCG/0.6ML IJ SOSY
PREFILLED_SYRINGE | INTRAMUSCULAR | Status: AC
  Filled 2020-11-09: qty 0.6

## 2020-11-09 MED ORDER — DARBEPOETIN ALFA 300 MCG/0.6ML IJ SOSY
300.0000 ug | PREFILLED_SYRINGE | Freq: Once | INTRAMUSCULAR | Status: AC
  Administered 2020-11-09: 300 ug via SUBCUTANEOUS

## 2020-11-09 NOTE — Progress Notes (Signed)
Hematology and Oncology Follow Up Visit  Kelly Soto [id number] Jun 12, 1956 65 y.o. 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  Mehan is back for follow-up.  She is getting ready to go to the [employer] for her anniversary.  She relieving I think on Saturday.  I am so happy for her.  I told her to make sure that she wears sunscreen and drinks a lot of water.  When we last saw her back in April, her ferritin was 949 with an iron saturation of 60%.  We have not had to give her Retacrit for a couple of months.  I am so happy about this.  She is doing well with her diabetes.  She says her blood sugars this morning was 76.  She has had no problems with nausea or vomiting.  She has had no cough or shortness of breath.  There is been no rashes.  She has had no leg swelling.  Overall, her performance status is ECOG 1.    Medications:  Current Outpatient Medications:  .  Ascorbic Acid (VITAMIN C) 1000 MG tablet, Take 1,000 mg by mouth daily., Disp: , Rfl:  .  baclofen (LIORESAL) 10 MG tablet, 1 tablet with food or milk as needed, Disp: , Rfl:  .  BD PEN NEEDLE NANO U/F 32G X 4 MM MISC, USE 1 ONCE DAILY WITH TRESIBA, Disp: , Rfl:  .  betamethasone valerate (VALISONE) 0.1 % cream, Apply 1 application topically daily as needed (irritation)., Disp: , Rfl:  .  Blood Glucose Monitoring Suppl (CONTOUR NEXT EZ) w/Device KIT, USE TO CHECK BLOOD SUGARS DAILY, Disp: , Rfl:  .  CONTOUR NEXT TEST test strip, USE STRIP TO CHECK GLUCOSE ONCE DAILY, Disp: , Rfl:  .  denosumab (PROLIA) 60 MG/ML SOLN injection, Inject 60 mg into the skin every 6 (six) months. Administer in upper arm,  thigh, or abdomen  Next injection is due 05-2016, Disp: , Rfl:  .  diazepam (VALIUM) 5 MG tablet, Take 5 mg by mouth daily as needed for anxiety. , Disp: , Rfl: 0 .  fluconazole (DIFLUCAN) 150 MG tablet, Take 150 mg by mouth as needed., Disp: , Rfl:  .  fluticasone (FLONASE) 50 MCG/ACT nasal spray, Place 2 sprays into both nostrils at bedtime., Disp: , Rfl:  .  Galcanezumab-gnlm (EMGALITY) 120 MG/ML SOAJ, Inject 120 mg into the skin every 30 (thirty) days., Disp: , Rfl:  .  glyBURIDE-metformin (GLUCOVANCE) 2.5-500 MG per tablet, Take 2 tablets by mouth 2 (two) times daily., Disp: , Rfl:  .  hydroxypropyl methylcellulose / hypromellose (ISOPTO TEARS / GONIOVISC) 2.5 % ophthalmic solution, Place 1 drop into both eyes 3 (three) times daily as needed for dry eyes. , Disp: , Rfl:  .  metoprolol succinate (TOPROL-XL) 50 MG 24 hr tablet, Take 100 mg by mouth every morning., Disp: , Rfl:  .  Microlet Lancets MISC, USE AS DIRECTED TO CHECK BLOOD SUGAR, Disp: , Rfl:  .  Multiple Vitamin (MULTI-VITAMINS) TABS, Take 1 tablet by mouth daily. , Disp: , Rfl:  .  omeprazole (PRILOSEC) 40 MG capsule, Take 40 mg by mouth 2 (two) times daily., Disp: ,  Rfl:  .  pioglitazone (ACTOS) 45 MG tablet, Take 45 mg by mouth daily., Disp: , Rfl:  .  Simethicone (GAS FREE EXTRA STRENGTH PO), Take 2 tablets by mouth at bedtime., Disp: , Rfl:  .  sodium chloride (OCEAN) 0.65 % nasal spray, 2 sprays, Disp: , Rfl:  .  tobramycin (TOBREX) 0.3 % ophthalmic solution, Place 1 drop into both eyes See admin instructions. Instill one drop into the affected eye four times a day prior and the day after eye injection., Disp: , Rfl:  .  traMADol (ULTRAM) 50 MG tablet, Take 1 tablet (50 mg total) by mouth at bedtime as needed., Disp: 20 tablet, Rfl: 0 .  traMADol (ULTRAM) 50 MG tablet, Take 1 tablet (50 mg total) by mouth every 6 (six) hours as needed., Disp: 30 tablet, Rfl: 0 .  TRESIBA FLEXTOUCH 100 UNIT/ML SOPN FlexTouch Pen, Inject 12 Units  into the skin daily. , Disp: , Rfl:  .  triamcinolone (KENALOG) 0.147 MG/GM topical spray, 1 application, Disp: , Rfl:  .  ondansetron (ZOFRAN) 4 MG tablet, Take 1 tablet (4 mg total) by mouth every 6 (six) hours as needed for nausea., Disp: 30 tablet, Rfl: 0 No current facility-administered medications for this visit.  Facility-Administered Medications Ordered in Other Visits:  .  Darbepoetin Alfa (ARANESP) injection 300 mcg, 300 mcg, Subcutaneous, Once, Rollie Hynek, Rudell Cobb, MD  Allergies:  Allergies  Allergen Reactions  . Ace Inhibitors Other (See Comments) and Anaphylaxis    Angioedema  . Aspirin Other (See Comments)    Cannot take due to gastric bypass surgery  . Nsaids Anaphylaxis and Other (See Comments)    S/p gastric bypass Cannot take orally due to gastric bypass S/p gastric bypass  . Sular [Nisoldipine Er] Other (See Comments)    angioedema  . Nisoldipine Swelling and Other (See Comments)  . Other Other (See Comments)  . Boniva [Ibandronic Acid] Other (See Comments)    Joint pain    Past Medical History, Surgical history, Social history, and Family History were reviewed and updated.  Review of Systems: Review of Systems  Constitutional: Negative.   HENT: Negative.   Eyes: Negative.   Respiratory: Negative.   Cardiovascular: Negative.   Gastrointestinal: Negative.   Genitourinary: Negative.   Musculoskeletal: Negative.   Skin: Negative.   Neurological: Negative.   Endo/Heme/Allergies: Negative.   Psychiatric/Behavioral: Negative.     Physical Exam:  weight is 153 lb (69.4 kg). Her oral temperature is 98.3 F (36.8 C). Her blood pressure is 164/71 (abnormal) and her pulse is 70. Her respiration is 18 and oxygen saturation is 100%.   Physical Exam Vitals reviewed.  HENT:     Head: Normocephalic and atraumatic.  Eyes:     Pupils: Pupils are equal, round, and reactive to light.  Cardiovascular:     Rate and Rhythm: Normal rate and regular rhythm.     Heart  sounds: Normal heart sounds.  Pulmonary:     Effort: Pulmonary effort is normal.     Breath sounds: Normal breath sounds.  Abdominal:     General: Bowel sounds are normal.     Palpations: Abdomen is soft.  Musculoskeletal:        General: No tenderness or deformity. Normal range of motion.     Cervical back: Normal range of motion.  Lymphadenopathy:     Cervical: No cervical adenopathy.  Skin:    General: Skin is warm and dry.     Findings: No erythema or rash.  Neurological:     Mental Status: She is alert and oriented to person, place, and time.  Psychiatric:        Behavior: Behavior normal.        Thought Content: Thought content normal.        Judgment: Judgment normal.      Lab Results  Component Value Date   WBC 8.4 11/09/2020   HGB 10.6 (L) 11/09/2020   HCT 34.0 (L) 11/09/2020   MCV 94.7 11/09/2020   PLT 233 11/09/2020     Chemistry      Component Value Date/Time   NA 138 10/05/2020 0742   NA 145 05/10/2017 0748   NA 137 12/06/2016 0746   K 5.6 (H) 10/05/2020 0742   K 4.7 05/10/2017 0748   K 5.2 (H) 12/06/2016 0746   CL 104 10/05/2020 0742   CL 110 (H) 05/10/2017 0748   CO2 27 10/05/2020 0742   CO2 23 05/10/2017 0748   CO2 23 12/06/2016 0746   BUN 35 (H) 10/05/2020 0742   BUN 28 (H) 05/10/2017 0748   BUN 26.1 (H) 12/06/2016 0746   CREATININE 1.15 (H) 10/05/2020 0742   CREATININE 1.2 05/10/2017 0748   CREATININE 1.2 (H) 12/06/2016 0746      Component Value Date/Time   CALCIUM 9.8 10/05/2020 0742   CALCIUM 9.1 05/10/2017 0748   CALCIUM 9.1 12/06/2016 0746   ALKPHOS 42 10/05/2020 0742   ALKPHOS 61 05/10/2017 0748   ALKPHOS 41 12/06/2016 0746   AST 20 10/05/2020 0742   AST 23 12/06/2016 0746   ALT 18 10/05/2020 0742   ALT 31 05/10/2017 0748   ALT 26 12/06/2016 0746   BILITOT 0.3 10/05/2020 0742   BILITOT 0.26 12/06/2016 0746       Impression and Plan: Ms. Fatula is 65 year old white female. She has diabetes. She has iron deficiency  anemia. She has a very low erythropoietin level.  We will go ahead and administer Retacrit today.  Overall, I am just happy that her hemoglobin is doing so nicely.  I think this goes along with her blood sugars being better controlled.  We will plan to get her back in another 6 weeks.  I think this would be very reasonable.  Volanda Napoleon, MD 5/17/20228:23 AM

## 2020-11-09 NOTE — Patient Instructions (Signed)
Darbepoetin Alfa injection What is this medicine? DARBEPOETIN ALFA (dar be POE e tin AL fa) helps your body make more red blood cells. It is used to treat anemia caused by chronic kidney failure and chemotherapy. This medicine may be used for other purposes; ask your health care provider or pharmacist if you have questions. COMMON BRAND NAME(S): Aranesp What should I tell my health care provider before I take this medicine? They need to know if you have any of these conditions:  blood clotting disorders or history of blood clots  cancer patient not on chemotherapy  cystic fibrosis  heart disease, such as angina, heart failure, or a history of a heart attack  hemoglobin level of 12 g/dL or greater  high blood pressure  low levels of folate, iron, or vitamin B12  seizures  an unusual or allergic reaction to darbepoetin, erythropoietin, albumin, hamster proteins, latex, other medicines, foods, dyes, or preservatives  pregnant or trying to get pregnant  breast-feeding How should I use this medicine? This medicine is for injection into a vein or under the skin. It is usually given by a health care professional in a hospital or clinic setting. If you get this medicine at home, you will be taught how to prepare and give this medicine. Use exactly as directed. Take your medicine at regular intervals. Do not take your medicine more often than directed. It is important that you put your used needles and syringes in a special sharps container. Do not put them in a trash can. If you do not have a sharps container, call your pharmacist or healthcare provider to get one. A special MedGuide will be given to you by the pharmacist with each prescription and refill. Be sure to read this information carefully each time. Talk to your pediatrician regarding the use of this medicine in children. While this medicine may be used in children as young as 1 month of age for selected conditions, precautions do  apply. Overdosage: If you think you have taken too much of this medicine contact a poison control center or emergency room at once. NOTE: This medicine is only for you. Do not share this medicine with others. What if I miss a dose? If you miss a dose, take it as soon as you can. If it is almost time for your next dose, take only that dose. Do not take double or extra doses. What may interact with this medicine? Do not take this medicine with any of the following medications:  epoetin alfa This list may not describe all possible interactions. Give your health care provider a list of all the medicines, herbs, non-prescription drugs, or dietary supplements you use. Also tell them if you smoke, drink alcohol, or use illegal drugs. Some items may interact with your medicine. What should I watch for while using this medicine? Your condition will be monitored carefully while you are receiving this medicine. You may need blood work done while you are taking this medicine. This medicine may cause a decrease in vitamin B6. You should make sure that you get enough vitamin B6 while you are taking this medicine. Discuss the foods you eat and the vitamins you take with your health care professional. What side effects may I notice from receiving this medicine? Side effects that you should report to your doctor or health care professional as soon as possible:  allergic reactions like skin rash, itching or hives, swelling of the face, lips, or tongue  breathing problems  changes in   vision  chest pain  confusion, trouble speaking or understanding  feeling faint or lightheaded, falls  high blood pressure  muscle aches or pains  pain, swelling, warmth in the leg  rapid weight gain  severe headaches  sudden numbness or weakness of the face, arm or leg  trouble walking, dizziness, loss of balance or coordination  seizures (convulsions)  swelling of the ankles, feet, hands  unusually weak or  tired Side effects that usually do not require medical attention (report to your doctor or health care professional if they continue or are bothersome):  diarrhea  fever, chills (flu-like symptoms)  headaches  nausea, vomiting  redness, stinging, or swelling at site where injected This list may not describe all possible side effects. Call your doctor for medical advice about side effects. You may report side effects to FDA at 1-800-FDA-1088. Where should I keep my medicine? Keep out of the reach of children. Store in a refrigerator between 2 and 8 degrees C (36 and 46 degrees F). Do not freeze. Do not shake. Throw away any unused portion if using a single-dose vial. Throw away any unused medicine after the expiration date. NOTE: This sheet is a summary. It may not cover all possible information. If you have questions about this medicine, talk to your doctor, pharmacist, or health care provider.  2021 Elsevier/Gold Standard (2017-06-27 16:44:20)  

## 2020-11-09 NOTE — Telephone Encounter (Signed)
appts made per 11/09/20 los and pt to gain sch in office and through [employer]

## 2020-12-21 ENCOUNTER — Inpatient Hospital Stay: Payer: Medicare Other

## 2020-12-21 ENCOUNTER — Inpatient Hospital Stay: Payer: Medicare Other | Attending: Hematology & Oncology

## 2020-12-21 ENCOUNTER — Other Ambulatory Visit: Payer: Self-pay

## 2020-12-21 ENCOUNTER — Telehealth: Payer: Self-pay

## 2020-12-21 ENCOUNTER — Encounter: Payer: Self-pay | Admitting: Hematology & Oncology

## 2020-12-21 ENCOUNTER — Inpatient Hospital Stay (HOSPITAL_BASED_OUTPATIENT_CLINIC_OR_DEPARTMENT_OTHER): Payer: Medicare Other | Admitting: Hematology & Oncology

## 2020-12-21 VITALS — BP 195/80 | HR 68 | Temp 98.1°F | Resp 18 | Wt 157.0 lb

## 2020-12-21 DIAGNOSIS — D631 Anemia in chronic kidney disease: Secondary | ICD-10-CM | POA: Diagnosis present

## 2020-12-21 DIAGNOSIS — D500 Iron deficiency anemia secondary to blood loss (chronic): Secondary | ICD-10-CM

## 2020-12-21 DIAGNOSIS — Z9884 Bariatric surgery status: Secondary | ICD-10-CM | POA: Insufficient documentation

## 2020-12-21 DIAGNOSIS — N1832 Chronic kidney disease, stage 3b: Secondary | ICD-10-CM | POA: Diagnosis not present

## 2020-12-21 DIAGNOSIS — E119 Type 2 diabetes mellitus without complications: Secondary | ICD-10-CM | POA: Insufficient documentation

## 2020-12-21 DIAGNOSIS — E611 Iron deficiency: Secondary | ICD-10-CM | POA: Insufficient documentation

## 2020-12-21 DIAGNOSIS — N189 Chronic kidney disease, unspecified: Secondary | ICD-10-CM | POA: Diagnosis not present

## 2020-12-21 DIAGNOSIS — N183 Chronic kidney disease, stage 3 unspecified: Secondary | ICD-10-CM

## 2020-12-21 LAB — CMP (CANCER CENTER ONLY)
ALT: 18 U/L (ref 0–44)
AST: 18 U/L (ref 15–41)
Albumin: 3.9 g/dL (ref 3.5–5.0)
Alkaline Phosphatase: 51 U/L (ref 38–126)
Anion gap: 9 (ref 5–15)
BUN: 35 mg/dL — ABNORMAL HIGH (ref 8–23)
CO2: 29 mmol/L (ref 22–32)
Calcium: 9.6 mg/dL (ref 8.9–10.3)
Chloride: 103 mmol/L (ref 98–111)
Creatinine: 1 mg/dL (ref 0.44–1.00)
GFR, Estimated: >60 mL/min (ref >60)
Glucose, Bld: 139 mg/dL — ABNORMAL HIGH (ref 70–99)
Potassium: 5 mmol/L (ref 3.5–5.1)
Sodium: 141 mmol/L (ref 135–145)
Total Bilirubin: 0.3 mg/dL (ref 0.3–1.2)
Total Protein: 6.5 g/dL (ref 6.5–8.1)

## 2020-12-21 LAB — CBC WITH DIFFERENTIAL (CANCER CENTER ONLY)
Abs Immature Granulocytes: 0.02 10*3/uL (ref 0.00–0.07)
Basophils Absolute: 0 10*3/uL (ref 0.0–0.1)
Basophils Relative: 1 %
Eosinophils Absolute: 0.1 10*3/uL (ref 0.0–0.5)
Eosinophils Relative: 2 %
HCT: 37.2 % (ref 36.0–46.0)
Hemoglobin: 11.5 g/dL — ABNORMAL LOW (ref 12.0–15.0)
Immature Granulocytes: 0 %
Lymphocytes Relative: 21 %
Lymphs Abs: 1.6 10*3/uL (ref 0.7–4.0)
MCH: 29.4 pg (ref 26.0–34.0)
MCHC: 30.9 g/dL (ref 30.0–36.0)
MCV: 95.1 fL (ref 80.0–100.0)
Monocytes Absolute: 0.6 10*3/uL (ref 0.1–1.0)
Monocytes Relative: 8 %
Neutro Abs: 5.1 10*3/uL (ref 1.7–7.7)
Neutrophils Relative %: 68 %
Platelet Count: 187 10*3/uL (ref 150–400)
RBC: 3.91 MIL/uL (ref 3.87–5.11)
RDW: 14.5 % (ref 11.5–15.5)
WBC Count: 7.4 10*3/uL (ref 4.0–10.5)
nRBC: 0 % (ref 0.0–0.2)

## 2020-12-21 LAB — IRON AND TIBC
Iron: 139 ug/dL (ref 28–170)
Saturation Ratios: 48 % — ABNORMAL HIGH (ref 10.4–31.8)
TIBC: 288 ug/dL (ref 250–450)
UIBC: 149 ug/dL

## 2020-12-21 LAB — RETICULOCYTES
Immature Retic Fract: 3.8 % (ref 2.3–15.9)
RBC.: 3.92 MIL/uL (ref 3.87–5.11)
Retic Count, Absolute: 23.9 10*3/uL (ref 19.0–186.0)
Retic Ct Pct: 0.6 % (ref 0.4–3.1)

## 2020-12-21 LAB — FERRITIN: Ferritin: 662 ng/mL — ABNORMAL HIGH (ref 11–307)

## 2020-12-21 NOTE — Telephone Encounter (Signed)
Appts made per 12/21/20 los   Kelly Soto 

## 2020-12-21 NOTE — Progress Notes (Signed)
Hematology and Oncology Follow Up Visit  Kelly Soto [id number] 1955-10-12 65 y.o. 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  She looks fantastic.  She feels well.  She and her husband are wonderful time in the [employer].  They really enjoyed themselves.  Her blood pressure is on the high side today.  Home O2 sure as to why it would be on the high side.  She is having no problems with headaches.  There is no leg swelling.  She had her right knee surgery back in November of last year.  She really has recovered well from this.  Her last iron studies that were done on her back in May showed iron saturation of 47%.  Her ferritin is 967.  She has had no problems with cough.  She has had no nausea or vomiting.  There is been no rashes.  She has had no headache.  Is been no change in bowel or bladder habits.  She is diabetic.  Her blood sugars are actually quite good today.  Overall, her performance status is ECOG 0.      Medications:  Current Outpatient Medications:    Ascorbic Acid (VITAMIN C) 1000 MG tablet, Take 1,000 mg by mouth daily., Disp: , Rfl:    baclofen (LIORESAL) 10 MG tablet, 1 tablet with food or milk as needed, Disp: , Rfl:    BD PEN NEEDLE NANO U/F 32G X 4 MM MISC, USE 1 ONCE DAILY WITH TRESIBA, Disp: , Rfl:    betamethasone valerate (VALISONE) 0.1 % cream, Apply 1 application topically daily as needed (irritation)., Disp: , Rfl:    Blood Glucose Monitoring Suppl (CONTOUR NEXT EZ) w/Device KIT, USE TO CHECK BLOOD SUGARS DAILY, Disp: , Rfl:    CONTOUR NEXT TEST test strip, USE STRIP TO CHECK GLUCOSE ONCE DAILY, Disp: , Rfl:     denosumab (PROLIA) 60 MG/ML SOLN injection, Inject 60 mg into the skin every 6 (six) months. Administer in upper arm, thigh, or abdomen  Next injection is due 05-2016, Disp: , Rfl:    diazepam (VALIUM) 5 MG tablet, Take 5 mg by mouth daily as needed for anxiety. , Disp: , Rfl: 0   fluconazole (DIFLUCAN) 150 MG tablet, Take 150 mg by mouth as needed., Disp: , Rfl:    fluticasone (FLONASE) 50 MCG/ACT nasal spray, Place 2 sprays into both nostrils at bedtime., Disp: , Rfl:    Galcanezumab-gnlm (EMGALITY) 120 MG/ML SOAJ, Inject 120 mg into the skin every 30 (thirty) days., Disp: , Rfl:    glyBURIDE-metformin (GLUCOVANCE) 2.5-500 MG per tablet, Take 2 tablets by mouth 2 (two) times daily., Disp: , Rfl:    hydroxypropyl methylcellulose / hypromellose (ISOPTO TEARS / GONIOVISC) 2.5 % ophthalmic solution, Place 1 drop into both eyes 3 (three) times daily as needed for dry eyes. , Disp: , Rfl:    metoprolol succinate (TOPROL-XL) 50 MG 24 hr tablet, Take 100 mg by mouth every morning., Disp: , Rfl:    Microlet Lancets MISC, USE AS DIRECTED TO CHECK BLOOD SUGAR, Disp: , Rfl:    Multiple Vitamin (MULTI-VITAMINS) TABS, Take 1 tablet by mouth daily. , Disp: , Rfl:  omeprazole (PRILOSEC) 40 MG capsule, Take 40 mg by mouth 2 (two) times daily., Disp: , Rfl:    ondansetron (ZOFRAN) 4 MG tablet, Take 1 tablet (4 mg total) by mouth every 6 (six) hours as needed for nausea., Disp: 30 tablet, Rfl: 0   pioglitazone (ACTOS) 45 MG tablet, Take 45 mg by mouth daily., Disp: , Rfl:    Simethicone (GAS FREE EXTRA STRENGTH PO), Take 2 tablets by mouth at bedtime., Disp: , Rfl:    sodium chloride (OCEAN) 0.65 % nasal spray, 2 sprays, Disp: , Rfl:    tobramycin (TOBREX) 0.3 % ophthalmic solution, Place 1 drop into both eyes See admin instructions. Instill one drop into the affected eye four times a day prior and the day after eye injection., Disp: , Rfl:    traMADol (ULTRAM) 50 MG tablet, Take 1 tablet (50 mg total) by mouth at  bedtime as needed., Disp: 20 tablet, Rfl: 0   traMADol (ULTRAM) 50 MG tablet, Take 1 tablet (50 mg total) by mouth every 6 (six) hours as needed., Disp: 30 tablet, Rfl: 0   TRESIBA FLEXTOUCH 100 UNIT/ML SOPN FlexTouch Pen, Inject 12 Units into the skin daily. , Disp: , Rfl:    triamcinolone (KENALOG) 0.147 MG/GM topical spray, 1 application, Disp: , Rfl:   Allergies:  Allergies  Allergen Reactions   Ace Inhibitors Other (See Comments) and Anaphylaxis    Angioedema   Aspirin Other (See Comments)    Cannot take due to gastric bypass surgery   Nsaids Anaphylaxis and Other (See Comments)    S/p gastric bypass Cannot take orally due to gastric bypass S/p gastric bypass   Sular [Nisoldipine Er] Other (See Comments)    angioedema   Nisoldipine Swelling and Other (See Comments)   Other Other (See Comments)   Boniva [Ibandronic Acid] Other (See Comments)    Joint pain    Past Medical History, Surgical history, Social history, and Family History were reviewed and updated.  Review of Systems: Review of Systems  Constitutional: Negative.   HENT: Negative.    Eyes: Negative.   Respiratory: Negative.    Cardiovascular: Negative.   Gastrointestinal: Negative.   Genitourinary: Negative.   Musculoskeletal: Negative.   Skin: Negative.   Neurological: Negative.   Endo/Heme/Allergies: Negative.   Psychiatric/Behavioral: Negative.     Physical Exam:  weight is 157 lb (71.2 kg). Her oral temperature is 98.1 F (36.7 C). Her blood pressure is 195/80 (abnormal) and her pulse is 68. Her respiration is 18 and oxygen saturation is 100%.   Physical Exam Vitals reviewed.  HENT:     Head: Normocephalic and atraumatic.  Eyes:     Pupils: Pupils are equal, round, and reactive to light.  Cardiovascular:     Rate and Rhythm: Normal rate and regular rhythm.     Heart sounds: Normal heart sounds.  Pulmonary:     Effort: Pulmonary effort is normal.     Breath sounds: Normal breath sounds.   Abdominal:     General: Bowel sounds are normal.     Palpations: Abdomen is soft.  Musculoskeletal:        General: No tenderness or deformity. Normal range of motion.     Cervical back: Normal range of motion.  Lymphadenopathy:     Cervical: No cervical adenopathy.  Skin:    General: Skin is warm and dry.     Findings: No erythema or rash.  Neurological:     Mental Status: She is alert and oriented  to person, place, and time.  Psychiatric:        Behavior: Behavior normal.        Thought Content: Thought content normal.        Judgment: Judgment normal.     Lab Results  Component Value Date   WBC 7.4 12/21/2020   HGB 11.5 (L) 12/21/2020   HCT 37.2 12/21/2020   MCV 95.1 12/21/2020   PLT 187 12/21/2020     Chemistry      Component Value Date/Time   NA 141 12/21/2020 0826   NA 145 05/10/2017 0748   NA 137 12/06/2016 0746   K 5.0 12/21/2020 0826   K 4.7 05/10/2017 0748   K 5.2 (H) 12/06/2016 0746   CL 103 12/21/2020 0826   CL 110 (H) 05/10/2017 0748   CO2 29 12/21/2020 0826   CO2 23 05/10/2017 0748   CO2 23 12/06/2016 0746   BUN 35 (H) 12/21/2020 0826   BUN 28 (H) 05/10/2017 0748   BUN 26.1 (H) 12/06/2016 0746   CREATININE 1.00 12/21/2020 0826   CREATININE 1.2 05/10/2017 0748   CREATININE 1.2 (H) 12/06/2016 0746      Component Value Date/Time   CALCIUM 9.6 12/21/2020 0826   CALCIUM 9.1 05/10/2017 0748   CALCIUM 9.1 12/06/2016 0746   ALKPHOS 51 12/21/2020 0826   ALKPHOS 61 05/10/2017 0748   ALKPHOS 41 12/06/2016 0746   AST 18 12/21/2020 0826   AST 23 12/06/2016 0746   ALT 18 12/21/2020 0826   ALT 31 05/10/2017 0748   ALT 26 12/06/2016 0746   BILITOT 0.3 12/21/2020 0826   BILITOT 0.26 12/06/2016 0746       Impression and Plan: Ms. Longnecker is 65 year old white female. She has diabetes. She has iron deficiency anemia. She has a very low erythropoietin level.  We will not have to give her any Retacrit today.  I am very happy about this.  We will see  what her iron studies look like.  I am sure that her blood pressure is not as high as we recorded.  We will plan to get her back in 6 weeks.  Sounds like she might be going away for her husband's birthday over the July 4 weekend.  [name]  Volanda Napoleon, MD 6/28/202210:00 AM

## 2021-01-20 ENCOUNTER — Emergency Department (HOSPITAL_COMMUNITY)
Admission: EM | Admit: 2021-01-20 | Discharge: 2021-01-20 | Disposition: A | Discharge status: Home / Self Care | Payer: Medicare Other | Attending: Emergency Medicine | Admitting: Emergency Medicine

## 2021-01-20 ENCOUNTER — Other Ambulatory Visit: Payer: Self-pay

## 2021-01-20 ENCOUNTER — Emergency Department (HOSPITAL_COMMUNITY): Payer: Medicare Other

## 2021-01-20 ENCOUNTER — Encounter (HOSPITAL_COMMUNITY): Payer: Self-pay

## 2021-01-20 DIAGNOSIS — E119 Type 2 diabetes mellitus without complications: Secondary | ICD-10-CM | POA: Insufficient documentation

## 2021-01-20 DIAGNOSIS — D631 Anemia in chronic kidney disease: Secondary | ICD-10-CM | POA: Insufficient documentation

## 2021-01-20 DIAGNOSIS — R7989 Other specified abnormal findings of blood chemistry: Secondary | ICD-10-CM | POA: Insufficient documentation

## 2021-01-20 DIAGNOSIS — U071 COVID-19: Secondary | ICD-10-CM | POA: Diagnosis not present

## 2021-01-20 DIAGNOSIS — Z96651 Presence of right artificial knee joint: Secondary | ICD-10-CM | POA: Diagnosis not present

## 2021-01-20 DIAGNOSIS — Z79899 Other long term (current) drug therapy: Secondary | ICD-10-CM | POA: Insufficient documentation

## 2021-01-20 DIAGNOSIS — N189 Chronic kidney disease, unspecified: Secondary | ICD-10-CM | POA: Insufficient documentation

## 2021-01-20 DIAGNOSIS — I129 Hypertensive chronic kidney disease with stage 1 through stage 4 chronic kidney disease, or unspecified chronic kidney disease: Secondary | ICD-10-CM | POA: Diagnosis not present

## 2021-01-20 DIAGNOSIS — Z7984 Long term (current) use of oral hypoglycemic drugs: Secondary | ICD-10-CM | POA: Diagnosis not present

## 2021-01-20 DIAGNOSIS — R0602 Shortness of breath: Secondary | ICD-10-CM | POA: Diagnosis present

## 2021-01-20 LAB — CBC WITH DIFFERENTIAL/PLATELET
Abs Immature Granulocytes: 0.03 10*3/uL (ref 0.00–0.07)
Basophils Absolute: 0 10*3/uL (ref 0.0–0.1)
Basophils Relative: 0 %
Eosinophils Absolute: 0 10*3/uL (ref 0.0–0.5)
Eosinophils Relative: 0 %
HCT: 36.6 % (ref 36.0–46.0)
Hemoglobin: 11.2 g/dL — ABNORMAL LOW (ref 12.0–15.0)
Immature Granulocytes: 0 %
Lymphocytes Relative: 21 %
Lymphs Abs: 1.6 10*3/uL (ref 0.7–4.0)
MCH: 29.6 pg (ref 26.0–34.0)
MCHC: 30.6 g/dL (ref 30.0–36.0)
MCV: 96.8 fL (ref 80.0–100.0)
Monocytes Absolute: 0.9 10*3/uL (ref 0.1–1.0)
Monocytes Relative: 12 %
Neutro Abs: 5.2 10*3/uL (ref 1.7–7.7)
Neutrophils Relative %: 67 %
Platelets: 226 10*3/uL (ref 150–400)
RBC: 3.78 MIL/uL — ABNORMAL LOW (ref 3.87–5.11)
RDW: 14.4 % (ref 11.5–15.5)
WBC: 7.8 10*3/uL (ref 4.0–10.5)
nRBC: 0 % (ref 0.0–0.2)

## 2021-01-20 LAB — COMPREHENSIVE METABOLIC PANEL
ALT: 28 U/L (ref 0–44)
AST: 34 U/L (ref 15–41)
Albumin: 3.8 g/dL (ref 3.5–5.0)
Alkaline Phosphatase: 51 U/L (ref 38–126)
Anion gap: 13 (ref 5–15)
BUN: 33 mg/dL — ABNORMAL HIGH (ref 8–23)
CO2: 21 mmol/L — ABNORMAL LOW (ref 22–32)
Calcium: 9 mg/dL (ref 8.9–10.3)
Chloride: 100 mmol/L (ref 98–111)
Creatinine, Ser: 1.66 mg/dL — ABNORMAL HIGH (ref 0.44–1.00)
GFR, Estimated: 34 mL/min — ABNORMAL LOW (ref >60)
Glucose, Bld: 77 mg/dL (ref 70–99)
Potassium: 4.9 mmol/L (ref 3.5–5.1)
Sodium: 134 mmol/L — ABNORMAL LOW (ref 135–145)
Total Bilirubin: 0.3 mg/dL (ref 0.3–1.2)
Total Protein: 7.1 g/dL (ref 6.5–8.1)

## 2021-01-20 MED ORDER — ONDANSETRON 4 MG PO TBDP
4.0000 mg | ORAL_TABLET | Freq: Once | ORAL | Status: AC
  Administered 2021-01-20: 4 mg via ORAL
  Filled 2021-01-20: qty 1

## 2021-01-20 MED ORDER — GUAIFENESIN 100 MG/5ML PO LIQD: 100.0000 mg | ORAL | 0 refills | Status: DC | PRN

## 2021-01-20 MED ORDER — ONDANSETRON 4 MG PO TBDP: 4.0000 mg | ORAL_TABLET | Freq: Three times a day (TID) | ORAL | 0 refills | Status: DC | PRN

## 2021-01-20 MED ORDER — ALBUTEROL SULFATE HFA 108 (90 BASE) MCG/ACT IN AERS
2.0000 | INHALATION_SPRAY | Freq: Once | RESPIRATORY_TRACT | Status: AC
  Administered 2021-01-20: 2 via RESPIRATORY_TRACT
  Filled 2021-01-20: qty 6.7

## 2021-01-20 NOTE — ED Notes (Signed)
Ambulation in room. 89% on ra. Patient tolerated well. Will admin albuterol inhaler and check pulse oximetry while ambulating again

## 2021-01-20 NOTE — ED Triage Notes (Signed)
Patient reports  that she tested positive for Covid 2 days ago. Patient is currently taking Paxlovid. Patient reports that she has had SOB and wheezing and her PCP wanted her to come to the ED due to her other medical problems.

## 2021-01-20 NOTE — ED Notes (Signed)
Water provided. Pt tolerated well

## 2021-01-20 NOTE — ED Provider Notes (Signed)
[facility] DEPT Provider Note   CSN: [id number] Arrival date & time: 01/20/21  1236     History Chief Complaint  Patient presents with   Covid Positive   Wheezing   Shortness of Breath    Kelly Soto is a 65 y.o. female presenting for evaluation of shortness of breath.  Patient states she developed symptoms of COVID 4 days ago, tested positive soon after.  She was started on Paxlovid 2 days ago.  Last night she had increased shortness of breath, coughing fits, and chest tightness.  She called her doctor who was concerned about wheezing that she heard over the phone, and patient was recommended to come to the ER.  Patient states now she feels better.  No chest tightness or shortness of breath at rest.  She does report nausea, diarrhea, fatigue.  She had fevers, but these have resolved.  She has not taken anything for her symptoms.  She has multiple sick contacts at home who are also COVID-positive.  She is vaccinated.  No history of COPD or asthma.  Does not wear oxygen at home.  Additional history attained from chart review.  Patient with a history of CKD, diabetes, GERD, hypertension  HPI     Past Medical History:  Diagnosis Date   Anemia of renal disease 09/24/2013   Anxiety    Arthritis    Chronic renal insufficiency 09/24/2013   Diabetes mellitus without complication (HCC)    Type II   Gastroparesis    GERD (gastroesophageal reflux disease)    Headache    migrains   History of hiatal hernia    Hypertension    PONV (postoperative nausea and vomiting)    Sleep apnea    Has not used the Cpap since having gastric bypass surgery.   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   Intestinal malabsorption 08/22/2013   Anemia, iron deficiency 08/22/2013    Chronic eustachian tube dysfunction 03/28/2011   Diabetes mellitus ([place]) 03/28/2011    Past Surgical History:  Procedure Laterality Date   ABDOMINAL HYSTERECTOMY  1992   BREAST BIOPSY Left 03/26/2017   benign   CARDIAC CATHETERIZATION     44yr ago- negative   CARPAL TUNNEL RELEASE Bilateral    CHOLECYSTECTOMY N/A 05/11/2016   Procedure: LAPAROSCOPIC CHOLECYSTECTOMY;  Surgeon: TErroll Luna MD;  [facility]  Service: General;  Laterality: N/A;   EYE SURGERY     Bilateral cataracts   GASTRIC BYPASS  2009   PARTIAL KNEE ARTHROPLASTY Right 05/04/2020   Procedure: right partial knee replacement;  Surgeon: DMeredith Pel MD;  [facility] [facility]  Service: Orthopedics;  Laterality: Right;     OB History   No obstetric history on file.     Family History  Problem Relation Age of Onset   Atrial fibrillation Mother    Heart attack Father    Sudden Cardiac Death Father    Stroke Maternal Grandmother     Social History   Tobacco Use   Smoking status: Never   Smokeless tobacco: Never   Tobacco comments:    never used tobacco  Vaping Use   Vaping Use: Never used  Substance Use Topics   Alcohol use: No    Alcohol/week: 0.0 standard drinks   Drug use: No  Home Medications Prior to Admission medications   Medication Sig Start Date End Date Taking? Authorizing Provider  guaiFENesin (ROBITUSSIN) 100 MG/5ML liquid Take 5-10 mLs (100-200 mg total) by mouth every 4 (four) hours as needed for cough. 01/20/21  Yes Tom Macpherson, PA-C  ondansetron (ZOFRAN ODT) 4 MG disintegrating tablet Take 1 tablet (4 mg total) by mouth every 8 (eight) hours as needed for nausea or vomiting. 01/20/21  Yes Garnet Chatmon, PA-C  Ascorbic Acid (VITAMIN C) 1000 MG tablet Take 1,000 mg by mouth daily.    [provider]  baclofen (LIORESAL) 10 MG tablet 1 tablet with food or milk as needed    [provider]  BD PEN NEEDLE NANO U/F 32G X 4 MM MISC USE 1 ONCE DAILY WITH  TRESIBA 06/10/19   [provider]  betamethasone valerate (VALISONE) 0.1 % cream Apply 1 application topically daily as needed (irritation).    [provider]  Blood Glucose Monitoring Suppl (CONTOUR NEXT EZ) w/Device KIT USE TO CHECK BLOOD SUGARS DAILY 05/09/19   [provider]  CONTOUR NEXT TEST test strip USE STRIP TO CHECK GLUCOSE ONCE DAILY 05/09/19   [provider]  denosumab (PROLIA) 60 MG/ML SOLN injection Inject 60 mg into the skin every 6 (six) months. Administer in upper arm, thigh, or abdomen  Next injection is due 05-2016    [provider]  diazepam (VALIUM) 5 MG tablet Take 5 mg by mouth daily as needed for anxiety.  01/20/15   [provider]  fluconazole (DIFLUCAN) 150 MG tablet Take 150 mg by mouth as needed. 05/10/20   [provider]  fluticasone (FLONASE) 50 MCG/ACT nasal spray Place 2 sprays into both nostrils at bedtime.    [provider]  Galcanezumab-gnlm (EMGALITY) 120 MG/ML SOAJ Inject 120 mg into the skin every 30 (thirty) days.    [provider]  glyBURIDE-metformin (GLUCOVANCE) 2.5-500 MG per tablet Take 2 tablets by mouth 2 (two) times daily. 02/06/13   [provider]  hydroxypropyl methylcellulose / hypromellose (ISOPTO TEARS / GONIOVISC) 2.5 % ophthalmic solution Place 1 drop into both eyes 3 (three) times daily as needed for dry eyes.     [provider]  metoprolol succinate (TOPROL-XL) 50 MG 24 hr tablet Take 100 mg by mouth every morning. 05/31/16   [provider]  Microlet Lancets MISC USE AS DIRECTED TO CHECK BLOOD SUGAR 05/09/19   [provider]  Multiple Vitamin (MULTI-VITAMINS) TABS Take 1 tablet by mouth daily.     [provider]  omeprazole (PRILOSEC) 40 MG capsule Take 40 mg by mouth 2 (two) times daily.    [provider]  ondansetron (ZOFRAN) 4 MG tablet Take 1 tablet (4 mg total) by mouth every 6 (six) hours as  needed for nausea. 05/07/20   Magnant, Charles L, PA-C  pioglitazone (ACTOS) 45 MG tablet Take 45 mg by mouth daily. 02/14/20   [provider]  Simethicone (GAS FREE EXTRA STRENGTH PO) Take 2 tablets by mouth at bedtime.    [provider]  sodium chloride (OCEAN) 0.65 % nasal spray 2 sprays 12/08/14   [provider]  tobramycin (TOBREX) 0.3 % ophthalmic solution Place 1 drop into both eyes See admin instructions. Instill one drop into the affected eye four times a day prior and the day after eye injection. 06/22/19   [provider]  traMADol (ULTRAM) 50 MG tablet Take 1 tablet (50 mg total) by mouth at bedtime as  needed. 10/29/20 10/29/21  Magnant, Charles L, PA-C  traMADol (ULTRAM) 50 MG tablet Take 1 tablet (50 mg total) by mouth every 6 (six) hours as needed. 10/30/20   Meredith Pel, MD  TRESIBA FLEXTOUCH 100 UNIT/ML SOPN FlexTouch Pen Inject 12 Units into the skin daily.  12/13/18   [provider]  triamcinolone (KENALOG) 0.147 MG/GM topical spray 1 application 93/81/82   [provider]    Allergies    Ace inhibitors, Aspirin, Nsaids, Sular [nisoldipine er], Nisoldipine, Other, and Boniva [ibandronic acid]  Review of Systems   Review of Systems  Constitutional:  Positive for fever.  HENT:  Positive for sore throat.   Respiratory:  Positive for choking, chest tightness, shortness of breath and wheezing.   Gastrointestinal:  Positive for diarrhea and nausea.  All other systems reviewed and are negative.  Physical Exam Updated Vital Signs BP (!) 152/61   Pulse 65   Temp 97.9 F (36.6 C) (Oral)   Resp 20   Ht 5' 0.5" (1.537 m)   Wt 70.3 kg   SpO2 98%   BMI 29.77 kg/m   Physical Exam Vitals and nursing note reviewed.  Constitutional:      General: She is not in acute distress.    Appearance: Normal appearance.     Comments: Resting in the bed in NAD  HENT:     Head: Normocephalic and atraumatic.  Eyes:      Conjunctiva/sclera: Conjunctivae normal.     Pupils: Pupils are equal, round, and reactive to light.  Cardiovascular:     Rate and Rhythm: Normal rate and regular rhythm.     Pulses: Normal pulses.  Pulmonary:     Effort: Pulmonary effort is normal. No respiratory distress.     Breath sounds: Normal breath sounds. No wheezing.     Comments: Speaking in full sentences.  Clear lung sounds in all fields.  No wheezing noted on my exam.  At rest, sats in the upper 90s on room air. Abdominal:     General: There is no distension.     Palpations: Abdomen is soft. There is no mass.     Tenderness: There is no abdominal tenderness. There is no guarding or rebound.  Musculoskeletal:        General: Normal range of motion.     Cervical back: Normal range of motion and neck supple.     Right lower leg: No edema.     Left lower leg: No edema.  Skin:    General: Skin is warm and dry.     Capillary Refill: Capillary refill takes less than 2 seconds.  Neurological:     Mental Status: She is alert and oriented to person, place, and time.  Psychiatric:        Mood and Affect: Mood and affect normal.        Speech: Speech normal.        Behavior: Behavior normal.    ED Results / Procedures / Treatments   Labs (all labs ordered are listed, but only abnormal results are displayed) Labs Reviewed  COMPREHENSIVE METABOLIC PANEL - Abnormal; Notable for the following components:      Result Value   Sodium 134 (*)    CO2 21 (*)    BUN 33 (*)    Creatinine, Ser 1.66 (*)    GFR, Estimated 34 (*)    All other components within normal limits  CBC WITH DIFFERENTIAL/PLATELET - Abnormal; Notable for the following components:  RBC 3.78 (*)    Hemoglobin 11.2 (*)    All other components within normal limits    EKG EKG Interpretation  Date/Time:  Thursday January 20 2021 13:29:50 EDT Ventricular Rate:  73 PR Interval:  156 QRS Duration: 64 QT Interval:  366 QTC Calculation: 403 R Axis:   77 Text  Interpretation: Normal sinus rhythm Normal ECG No significant change since prior 11/21 Confirmed by Aletta Edouard [phone]) on 01/20/2021 1:45:37 PM  Radiology DG Chest Port 1 View  Result Date: 01/20/2021 CLINICAL DATA:  Shortness of breath. Coronavirus. Wheezing and dyspnea. EXAM: PORTABLE CHEST 1 VIEW COMPARISON:  06/29/2016 FINDINGS: The heart size and mediastinal contours are within normal limits. Both lungs are clear. The visualized skeletal structures are unremarkable. IMPRESSION: No active disease. Electronically Signed   By: Nelson Chimes M.D.   On: 01/20/2021 13:32    Procedures Procedures   Medications Ordered in ED Medications  ondansetron (ZOFRAN-ODT) disintegrating tablet 4 mg (4 mg Oral Given 01/20/21 1537)  albuterol (VENTOLIN HFA) 108 (90 Base) MCG/ACT inhaler 2 puff (2 puffs Inhalation Given 01/20/21 1537)    ED Course  I have reviewed the triage vital signs and the nursing notes.  Pertinent labs & imaging results that were available during my care of the patient were reviewed by me and considered in my medical decision making (see chart for details).    MDM Rules/Calculators/A&P                           Patient presented for evaluation of shortness of breath in the setting of known COVID infection.  On exam, patient appears nontoxic.  Pulmonary exam is overall reassuring.  Labs obtained from triage show mild elevation in creatinine from baseline.  Discussed with patient.  This is likely in the setting of nausea causing decreased p.o. intake and diarrhea.  Encouraged hydration.  Chest x-ray viewed and independently interpreted by me, no pneumonia pneumothorax or effusion.  EKG is nonischemic.  Will ensure patient is able to ambulate without hypoxia, treat symptomatically, and reassess.  Initially, patient had hypoxia to 89% on room air when ambulating.  After albuterol however, sats stayed in the upper 90s with ambulation.  Patient reports no shortness of breath, and states  she is feeling well.  Nausea has completely resolved with Zofran, she states she feels well enough to go home.  I discussed strict return precautions, as well as use of albuterol at home as needed for shortness of breath.  At this time, patient appears safe for discharge.  Return precautions given.  Patient states she understands and agrees to plan.  Final Clinical Impression(s) / ED Diagnoses Final diagnoses:  COVID-19  Elevated serum creatinine    Rx / DC Orders ED Discharge Orders          Ordered    ondansetron (ZOFRAN ODT) 4 MG disintegrating tablet  Every 8 hours PRN        01/20/21 1652    guaiFENesin (ROBITUSSIN) 100 MG/5ML liquid  Every 4 hours PRN        01/20/21 1652             Tyland Klemens, PA-C 01/20/21 1658    Blanchie Dessert, MD 01/21/21 1754

## 2021-01-20 NOTE — ED Provider Notes (Signed)
Emergency Medicine Provider Triage Evaluation Note  Kelly Soto , a 65 y.o. female  was evaluated in triage.  Pt complains of SOB after being diagnosed with COVID on Tuesday. States that she is wheezing and having difficulty breathing. Was vaccinated. Positive sick contact. Some chest tightness. No history of asthma or COPD. No tobacco. Has had a fever. On Paxlovid.   Review of Systems  Positive: SOB, CP, diarrhea, nausea  Negative: Vomiting  Physical Exam  BP (!) 187/93 (BP Location: Left Arm)   Pulse 77   Temp 97.9 F (36.6 C) (Oral)   Resp 16   Ht 5' 0.5" (1.537 m)   Wt 70.3 kg   SpO2 100%   BMI 29.77 kg/m  Gen:   Awake, no distress   Resp:  Normal effort, oxygen is 100% MSK:   Moves extremities without difficulty  Other:   Medical Decision Making  Medically screening exam initiated at 12:59 PM.  Appropriate orders placed.  Kelly Soto was informed that the remainder of the evaluation will be completed by another provider, this initial triage assessment does not replace that evaluation, and the importance of remaining in the ED until their evaluation is complete.     Alfredia Client, PA-C 01/20/21 1301    Quintella Reichert, MD 01/22/21 [phone]

## 2021-01-20 NOTE — ED Notes (Signed)
Ambulating in room 97% on RA. No distress noted

## 2021-01-20 NOTE — Discharge Instructions (Addendum)
Continue taking the Paxlovid as prescribed. Use Zofran as needed for nausea or vomiting. Use 2 puffs of the albuterol every 4 hours while awake for the next 2 days.  After this, use as needed for wheezing, shortness of breath, chest tightness, coughing fits. Take cough medicine as needed. Make sure you are staying well-hydrated water. Follow-up with your primary care doctor for recheck of your kidney function after finishing your quarantine. Return to the emergency room if you develop increased shortness of breath, chest pain, or any new, worsening, concerning symptoms.

## 2021-01-27 ENCOUNTER — Ambulatory Visit: Payer: BLUE CROSS/BLUE SHIELD | Admitting: Orthopedic Surgery

## 2021-02-01 ENCOUNTER — Other Ambulatory Visit: Payer: Self-pay

## 2021-02-01 ENCOUNTER — Inpatient Hospital Stay (HOSPITAL_BASED_OUTPATIENT_CLINIC_OR_DEPARTMENT_OTHER): Payer: Medicare Other | Admitting: Hematology & Oncology

## 2021-02-01 ENCOUNTER — Inpatient Hospital Stay: Payer: Medicare Other

## 2021-02-01 ENCOUNTER — Encounter: Payer: Self-pay | Admitting: Hematology & Oncology

## 2021-02-01 ENCOUNTER — Inpatient Hospital Stay: Payer: Medicare Other | Attending: Hematology & Oncology

## 2021-02-01 VITALS — BP 181/73 | HR 62 | Temp 98.5°F | Resp 18 | Wt 154.8 lb

## 2021-02-01 DIAGNOSIS — N189 Chronic kidney disease, unspecified: Secondary | ICD-10-CM

## 2021-02-01 DIAGNOSIS — D631 Anemia in chronic kidney disease: Secondary | ICD-10-CM | POA: Diagnosis not present

## 2021-02-01 DIAGNOSIS — N1832 Chronic kidney disease, stage 3b: Secondary | ICD-10-CM

## 2021-02-01 DIAGNOSIS — K9041 Non-celiac gluten sensitivity: Secondary | ICD-10-CM | POA: Diagnosis not present

## 2021-02-01 DIAGNOSIS — N1831 Chronic kidney disease, stage 3a: Secondary | ICD-10-CM | POA: Diagnosis not present

## 2021-02-01 DIAGNOSIS — D500 Iron deficiency anemia secondary to blood loss (chronic): Secondary | ICD-10-CM

## 2021-02-01 LAB — CBC WITH DIFFERENTIAL (CANCER CENTER ONLY)
Abs Immature Granulocytes: 0.07 10*3/uL (ref 0.00–0.07)
Basophils Absolute: 0 10*3/uL (ref 0.0–0.1)
Basophils Relative: 0 %
Eosinophils Absolute: 0.1 10*3/uL (ref 0.0–0.5)
Eosinophils Relative: 1 %
HCT: 30.6 % — ABNORMAL LOW (ref 36.0–46.0)
Hemoglobin: 9.5 g/dL — ABNORMAL LOW (ref 12.0–15.0)
Immature Granulocytes: 1 %
Lymphocytes Relative: 17 %
Lymphs Abs: 1.6 10*3/uL (ref 0.7–4.0)
MCH: 29.5 pg (ref 26.0–34.0)
MCHC: 31 g/dL (ref 30.0–36.0)
MCV: 95 fL (ref 80.0–100.0)
Monocytes Absolute: 0.8 10*3/uL (ref 0.1–1.0)
Monocytes Relative: 9 %
Neutro Abs: 6.7 10*3/uL (ref 1.7–7.7)
Neutrophils Relative %: 72 %
Platelet Count: 227 10*3/uL (ref 150–400)
RBC: 3.22 MIL/uL — ABNORMAL LOW (ref 3.87–5.11)
RDW: 13.9 % (ref 11.5–15.5)
WBC Count: 9.3 10*3/uL (ref 4.0–10.5)
nRBC: 0 % (ref 0.0–0.2)

## 2021-02-01 LAB — CMP (CANCER CENTER ONLY)
ALT: 17 U/L (ref 0–44)
AST: 17 U/L (ref 15–41)
Albumin: 3.5 g/dL (ref 3.5–5.0)
Alkaline Phosphatase: 47 U/L (ref 38–126)
Anion gap: 8 (ref 5–15)
BUN: 30 mg/dL — ABNORMAL HIGH (ref 8–23)
CO2: 23 mmol/L (ref 22–32)
Calcium: 8.6 mg/dL — ABNORMAL LOW (ref 8.9–10.3)
Chloride: 111 mmol/L (ref 98–111)
Creatinine: 1.07 mg/dL — ABNORMAL HIGH (ref 0.44–1.00)
GFR, Estimated: 57 mL/min — ABNORMAL LOW (ref >60)
Glucose, Bld: 123 mg/dL — ABNORMAL HIGH (ref 70–99)
Potassium: 5.5 mmol/L — ABNORMAL HIGH (ref 3.5–5.1)
Sodium: 142 mmol/L (ref 135–145)
Total Bilirubin: 0.2 mg/dL — ABNORMAL LOW (ref 0.3–1.2)
Total Protein: 6 g/dL — ABNORMAL LOW (ref 6.5–8.1)

## 2021-02-01 LAB — IRON AND TIBC
Iron: 129 ug/dL (ref 41–142)
Saturation Ratios: 58 % — ABNORMAL HIGH (ref 21–57)
TIBC: 221 ug/dL — ABNORMAL LOW (ref 236–444)
UIBC: 92 ug/dL — ABNORMAL LOW (ref 120–384)

## 2021-02-01 LAB — RETICULOCYTES
Immature Retic Fract: 12.5 % (ref 2.3–15.9)
RBC.: 3.18 MIL/uL — ABNORMAL LOW (ref 3.87–5.11)
Retic Count, Absolute: 49 10*3/uL (ref 19.0–186.0)
Retic Ct Pct: 1.5 % (ref 0.4–3.1)

## 2021-02-01 LAB — FERRITIN: Ferritin: 553 ng/mL — ABNORMAL HIGH (ref 11–307)

## 2021-02-01 MED ORDER — EPOETIN ALFA-EPBX 40000 UNIT/ML IJ SOLN
INTRAMUSCULAR | Status: AC
  Filled 2021-02-01: qty 1

## 2021-02-01 MED ORDER — DARBEPOETIN ALFA 300 MCG/0.6ML IJ SOSY
PREFILLED_SYRINGE | INTRAMUSCULAR | Status: AC
  Filled 2021-02-01: qty 0.6

## 2021-02-01 MED ORDER — DARBEPOETIN ALFA 300 MCG/0.6ML IJ SOSY
300.0000 ug | PREFILLED_SYRINGE | Freq: Once | INTRAMUSCULAR | Status: AC
  Administered 2021-02-01: 300 ug via SUBCUTANEOUS

## 2021-02-01 NOTE — Patient Instructions (Signed)
Darbepoetin Alfa injection What is this medication? DARBEPOETIN ALFA (dar be POE e tin AL fa) helps your body make more red blood cells. It is used to treat anemia caused by chronic kidney failure and chemotherapy. This medicine may be used for other purposes; ask your health care provider or pharmacist if you have questions. COMMON BRAND NAME(S): Aranesp What should I tell my care team before I take this medication? They need to know if you have any of these conditions: blood clotting disorders or history of blood clots cancer patient not on chemotherapy cystic fibrosis heart disease, such as angina, heart failure, or a history of a heart attack hemoglobin level of 12 g/dL or greater high blood pressure low levels of folate, iron, or vitamin B12 seizures an unusual or allergic reaction to darbepoetin, erythropoietin, albumin, hamster proteins, latex, other medicines, foods, dyes, or preservatives pregnant or trying to get pregnant breast-feeding How should I use this medication? This medicine is for injection into a vein or under the skin. It is usually given by a health care professional in a hospital or clinic setting. If you get this medicine at home, you will be taught how to prepare and give this medicine. Use exactly as directed. Take your medicine at regular intervals. Do not take your medicine more often than directed. It is important that you put your used needles and syringes in a special sharps container. Do not put them in a trash can. If you do not have a sharps container, call your pharmacist or healthcare provider to get one. A special MedGuide will be given to you by the pharmacist with each prescription and refill. Be sure to read this information carefully each time. Talk to your pediatrician regarding the use of this medicine in children. While this medicine may be used in children as young as 1 month of age for selected conditions, precautions do apply. Overdosage: If you  think you have taken too much of this medicine contact a poison control center or emergency room at once. NOTE: This medicine is only for you. Do not share this medicine with others. What if I miss a dose? If you miss a dose, take it as soon as you can. If it is almost time for your next dose, take only that dose. Do not take double or extra doses. What may interact with this medication? Do not take this medicine with any of the following medications: epoetin alfa This list may not describe all possible interactions. Give your health care provider a list of all the medicines, herbs, non-prescription drugs, or dietary supplements you use. Also tell them if you smoke, drink alcohol, or use illegal drugs. Some items may interact with your medicine. What should I watch for while using this medication? Your condition will be monitored carefully while you are receiving this medicine. You may need blood work done while you are taking this medicine. This medicine may cause a decrease in vitamin B6. You should make sure that you get enough vitamin B6 while you are taking this medicine. Discuss the foods you eat and the vitamins you take with your health care professional. What side effects may I notice from receiving this medication? Side effects that you should report to your doctor or health care professional as soon as possible: allergic reactions like skin rash, itching or hives, swelling of the face, lips, or tongue breathing problems changes in vision chest pain confusion, trouble speaking or understanding feeling faint or lightheaded, falls high blood pressure   muscle aches or pains pain, swelling, warmth in the leg rapid weight gain severe headaches sudden numbness or weakness of the face, arm or leg trouble walking, dizziness, loss of balance or coordination seizures (convulsions) swelling of the ankles, feet, hands unusually weak or tired Side effects that usually do not require medical  attention (report to your doctor or health care professional if they continue or are bothersome): diarrhea fever, chills (flu-like symptoms) headaches nausea, vomiting redness, stinging, or swelling at site where injected This list may not describe all possible side effects. Call your doctor for medical advice about side effects. You may report side effects to FDA at 1-800-FDA-1088. Where should I keep my medication? Keep out of the reach of children. Store in a refrigerator between 2 and 8 degrees C (36 and 46 degrees F). Do not freeze. Do not shake. Throw away any unused portion if using a single-dose vial. Throw away any unused medicine after the expiration date. NOTE: This sheet is a summary. It may not cover all possible information. If you have questions about this medicine, talk to your doctor, pharmacist, or health care provider.  2022 Elsevier/Gold Standard (2017-06-27 16:44:20)  

## 2021-02-01 NOTE — Addendum Note (Signed)
Addended by: Volanda Napoleon on: 02/01/2021 08:36 AM   Modules accepted: Orders

## 2021-02-01 NOTE — Progress Notes (Addendum)
Hematology and Oncology Follow Up Visit  Kelly Soto [id number] Aug 21, 1955 65 y.o. 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 is back for follow-up.  Unfortunately, she had COVID a few weeks ago.  She got it from her father who got it from the hospital.  The whole family had [place].  She had diarrhea.  She had a headache.  She does have some fatigue.  Her blood sugars were not all that affected which was good to see.  She does feel tired.  Hemoglobin is down to 9.5.  We will give her dose of Aranesp today.  When we last saw her back in June, her ferritin was 668 with iron saturation of 48%.  She has had no problems with cough.  She had no nausea or vomiting.  She has had no rashes.  Is been no leg swelling.  She does have chronic hyperkalemia.  Overall, I would say performance status is probably ECOG 1.     Medications:  Current Outpatient Medications:    Ascorbic Acid (VITAMIN C) 1000 MG tablet, Take 1,000 mg by mouth daily., Disp: , Rfl:    baclofen (LIORESAL) 10 MG tablet, 1 tablet with food or milk as needed, Disp: , Rfl:    BD PEN NEEDLE NANO U/F 32G X 4 MM MISC, USE 1 ONCE DAILY WITH TRESIBA, Disp: , Rfl:    betamethasone valerate (VALISONE) 0.1 % cream, Apply 1 application topically daily as needed (irritation)., Disp: , Rfl:    Blood Glucose Monitoring Suppl (CONTOUR NEXT EZ) w/Device KIT, USE TO CHECK BLOOD SUGARS DAILY, Disp: , Rfl:    CONTOUR NEXT TEST test strip, USE STRIP TO CHECK GLUCOSE ONCE DAILY, Disp: , Rfl:    denosumab (PROLIA) 60 MG/ML SOLN injection, Inject 60 mg into the skin every 6 (six) months. Administer in upper arm, thigh, or abdomen  Next  injection is due 05-2016, Disp: , Rfl:    diazepam (VALIUM) 5 MG tablet, Take 5 mg by mouth daily as needed for anxiety. , Disp: , Rfl: 0   Galcanezumab-gnlm (EMGALITY) 120 MG/ML SOAJ, Inject 120 mg into the skin every 30 (thirty) days., Disp: , Rfl:    glyBURIDE-metformin (GLUCOVANCE) 2.5-500 MG per tablet, Take 2 tablets by mouth 2 (two) times daily., Disp: , Rfl:    hydroxypropyl methylcellulose / hypromellose (ISOPTO TEARS / GONIOVISC) 2.5 % ophthalmic solution, Place 1 drop into both eyes 3 (three) times daily as needed for dry eyes. , Disp: , Rfl:    metoprolol succinate (TOPROL-XL) 50 MG 24 hr tablet, Take 100 mg by mouth every morning., Disp: , Rfl:    Microlet Lancets MISC, USE AS DIRECTED TO CHECK BLOOD SUGAR, Disp: , Rfl:    omeprazole (PRILOSEC) 40 MG capsule, Take 40 mg by mouth 2 (two) times daily., Disp: , Rfl:    ondansetron (ZOFRAN ODT) 4 MG disintegrating tablet, Take 1 tablet (4 mg total) by mouth every 8 (eight) hours as needed for nausea or vomiting., Disp: 15 tablet, Rfl: 0   pioglitazone (ACTOS) 45 MG tablet, Take 45 mg by mouth daily., Disp: , Rfl:    Simethicone (GAS FREE EXTRA STRENGTH PO), Take 2 tablets by mouth at  bedtime., Disp: , Rfl:    sodium chloride (OCEAN) 0.65 % nasal spray, 2 sprays, Disp: , Rfl:    tobramycin (TOBREX) 0.3 % ophthalmic solution, Place 1 drop into both eyes See admin instructions. Instill one drop into the affected eye four times a day prior and the day after eye injection., Disp: , Rfl:    traMADol (ULTRAM) 50 MG tablet, Take 1 tablet (50 mg total) by mouth at bedtime as needed., Disp: 20 tablet, Rfl: 0   TRESIBA FLEXTOUCH 100 UNIT/ML SOPN FlexTouch Pen, Inject 12 Units into the skin daily. , Disp: , Rfl:    triamcinolone (KENALOG) 0.147 MG/GM topical spray, 1 application, Disp: , Rfl:    fluconazole (DIFLUCAN) 150 MG tablet, Take 150 mg by mouth as needed., Disp: , Rfl:    fluticasone (FLONASE) 50 MCG/ACT nasal spray, Place 2 sprays into both  nostrils at bedtime., Disp: , Rfl:    guaiFENesin (ROBITUSSIN) 100 MG/5ML liquid, Take 5-10 mLs (100-200 mg total) by mouth every 4 (four) hours as needed for cough., Disp: 60 mL, Rfl: 0  Allergies:  Allergies  Allergen Reactions   Ace Inhibitors Other (See Comments) and Anaphylaxis    Angioedema   Aspirin Other (See Comments)    Cannot take due to gastric bypass surgery   Nsaids Anaphylaxis and Other (See Comments)    S/p gastric bypass Cannot take orally due to gastric bypass S/p gastric bypass   Sular [Nisoldipine Er] Other (See Comments)    angioedema   Nisoldipine Swelling and Other (See Comments)   Other Other (See Comments)   Boniva [Ibandronic Acid] Other (See Comments)    Joint pain    Past Medical History, Surgical history, Social history, and Family History were reviewed and updated.  Review of Systems: Review of Systems  Constitutional: Negative.   HENT: Negative.    Eyes: Negative.   Respiratory: Negative.    Cardiovascular: Negative.   Gastrointestinal: Negative.   Genitourinary: Negative.   Musculoskeletal: Negative.   Skin: Negative.   Neurological: Negative.   Endo/Heme/Allergies: Negative.   Psychiatric/Behavioral: Negative.     Physical Exam:  weight is 154 lb 12 oz (70.2 kg). Her oral temperature is 98.5 F (36.9 C). Her blood pressure is 181/73 (abnormal) and her pulse is 62. Her respiration is 18 and oxygen saturation is 100%.   Physical Exam Vitals reviewed.  HENT:     Head: Normocephalic and atraumatic.  Eyes:     Pupils: Pupils are equal, round, and reactive to light.  Cardiovascular:     Rate and Rhythm: Normal rate and regular rhythm.     Heart sounds: Normal heart sounds.  Pulmonary:     Effort: Pulmonary effort is normal.     Breath sounds: Normal breath sounds.  Abdominal:     General: Bowel sounds are normal.     Palpations: Abdomen is soft.  Musculoskeletal:        General: No tenderness or deformity. Normal range of motion.      Cervical back: Normal range of motion.  Lymphadenopathy:     Cervical: No cervical adenopathy.  Skin:    General: Skin is warm and dry.     Findings: No erythema or rash.  Neurological:     Mental Status: She is alert and oriented to person, place, and time.  Psychiatric:        Behavior: Behavior normal.        Thought Content: Thought content normal.  Judgment: Judgment normal.     Lab Results  Component Value Date   WBC 9.3 02/01/2021   HGB 9.5 (L) 02/01/2021   HCT 30.6 (L) 02/01/2021   MCV 95.0 02/01/2021   PLT 227 02/01/2021     Chemistry      Component Value Date/Time   NA 134 (L) 01/20/2021 1326   NA 145 05/10/2017 0748   NA 137 12/06/2016 0746   K 4.9 01/20/2021 1326   K 4.7 05/10/2017 0748   K 5.2 (H) 12/06/2016 0746   CL 100 01/20/2021 1326   CL 110 (H) 05/10/2017 0748   CO2 21 (L) 01/20/2021 1326   CO2 23 05/10/2017 0748   CO2 23 12/06/2016 0746   BUN 33 (H) 01/20/2021 1326   BUN 28 (H) 05/10/2017 0748   BUN 26.1 (H) 12/06/2016 0746   CREATININE 1.66 (H) 01/20/2021 1326   CREATININE 1.00 12/21/2020 0826   CREATININE 1.2 05/10/2017 0748   CREATININE 1.2 (H) 12/06/2016 0746      Component Value Date/Time   CALCIUM 9.0 01/20/2021 1326   CALCIUM 9.1 05/10/2017 0748   CALCIUM 9.1 12/06/2016 0746   ALKPHOS 51 01/20/2021 1326   ALKPHOS 61 05/10/2017 0748   ALKPHOS 41 12/06/2016 0746   AST 34 01/20/2021 1326   AST 18 12/21/2020 0826   AST 23 12/06/2016 0746   ALT 28 01/20/2021 1326   ALT 18 12/21/2020 0826   ALT 31 05/10/2017 0748   ALT 26 12/06/2016 0746   BILITOT 0.3 01/20/2021 1326   BILITOT 0.3 12/21/2020 0826   BILITOT 0.26 12/06/2016 0746       Impression and Plan: Ms. Khader is 65 year old white female. She has diabetes. She has iron deficiency anemia. She has a very low erythropoietin level.  We will to give her Aranesp today.  I am sure her iron studies should be okay.  Her blood pressure is still a little on the high  side.  It was tests high whenever she is here.  I think we will have to get her back in about 4 weeks or so.  I want to make sure that we work on getting her blood count a little bit better.    Volanda Napoleon, MD 8/9/20228:09 AM

## 2021-02-03 ENCOUNTER — Other Ambulatory Visit: Payer: Self-pay

## 2021-02-03 ENCOUNTER — Ambulatory Visit (INDEPENDENT_AMBULATORY_CARE_PROVIDER_SITE_OTHER): Payer: Medicare Other | Admitting: Orthopedic Surgery

## 2021-02-03 ENCOUNTER — Ambulatory Visit (INDEPENDENT_AMBULATORY_CARE_PROVIDER_SITE_OTHER): Payer: Medicare Other

## 2021-02-03 ENCOUNTER — Encounter: Payer: Self-pay | Admitting: Orthopedic Surgery

## 2021-02-03 DIAGNOSIS — Z9889 Other specified postprocedural states: Secondary | ICD-10-CM

## 2021-02-05 NOTE — Progress Notes (Signed)
Office Visit Note   Patient: Kelly Soto           Date of Birth: [birth year]           MRN: [id number] Visit Date: 02/03/2021 Requested by: [name], MD 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  She has been doing reasonably well but still gets stiff at times.  Taking Tylenol and muscle relaxer at night.  Overall she is improving with less pain and improved functional abilities.  Denies any fevers or chills.              ROS: All systems reviewed are negative as they relate to the chief complaint within the history of present illness.  Patient denies  fevers or chills.   Assessment & Plan: Visit Diagnoses:  1. Status post arthroscopy of right knee     Plan: Impression is improving right knee unicompartmental knee replacement in terms of pain and function.  No effusion today.  Radiographs look good.  She is likely going to plateau within the next month or 2 in terms of her symptoms.  Overall she is more functional than she was before.  Plan to continue nonweightbearing quad strengthening exercises and follow-up as needed.  Follow-Up Instructions: No follow-ups on file.   Orders:  Orders Placed This Encounter  Procedures   XR Knee 1-2 Views Right   No orders of the defined types were placed in this encounter.     Procedures: No procedures performed   Clinical Data: No additional findings.  Objective: Vital Signs: There were no vitals taken for this visit.  Physical Exam:   Constitutional: Patient appears well-developed HEENT:  Head: Normocephalic Eyes:EOM are normal Neck: Normal range of motion Cardiovascular: Normal rate Pulmonary/chest: Effort normal Neurologic: Patient is alert Skin: Skin is  warm Psychiatric: Patient has normal mood and affect   Ortho Exam: Ortho exam demonstrates full active and passive range of motion of the left knee.  Right knee has excellent range of motion as well from 0 to about 115 of flexion.  Not quite as much as she has on the left-hand side.  No effusion.  She has fairly seamless range of motion with no crepitus on the right-hand side.  No groin pain with internal ex rotation of either leg.  Pedal pulses palpable.  Specialty Comments:  No specialty comments available.  Imaging: No results found.   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   Chronic renal insufficiency 09/24/2013   Anemia of renal disease 09/24/2013   H/O gastric bypass 08/22/2013   Intestinal malabsorption 08/22/2013   Anemia, iron deficiency 08/22/2013   Chronic eustachian tube dysfunction 03/28/2011   Diabetes mellitus ([place]) 03/28/2011   Past Medical History:  Diagnosis Date   Anemia of renal disease 09/24/2013   Anxiety    Arthritis    Chronic renal insufficiency 09/24/2013   Diabetes mellitus without complication (HCC)    Type II   Gastroparesis    GERD (gastroesophageal reflux disease)    Headache    migrains   History of hiatal hernia    Hypertension    PONV (postoperative nausea and vomiting)    Sleep  apnea    Has not used the Cpap since having gastric bypass surgery.   Ulcer     Family History  Problem Relation Age of Onset   Atrial fibrillation Mother    Heart attack Father    Sudden Cardiac Death Father    Stroke Maternal Grandmother     Past Surgical History:  Procedure Laterality Date   ABDOMINAL HYSTERECTOMY  1992   BREAST BIOPSY Left 03/26/2017   benign   CARDIAC CATHETERIZATION     99yrs ago- negative   CARPAL TUNNEL RELEASE Bilateral    CHOLECYSTECTOMY N/A 05/11/2016   Procedure: LAPAROSCOPIC CHOLECYSTECTOMY;  Surgeon: Erroll Luna, MD;  Location: [facility];   Service: General;  Laterality: N/A;   EYE SURGERY     Bilateral cataracts   GASTRIC BYPASS  2009   PARTIAL KNEE ARTHROPLASTY Right 05/04/2020   Procedure: right partial knee replacement;  Surgeon: Meredith Pel, MD;  Location: [facility];  Service: Orthopedics;  Laterality: Right;   Social History   Occupational History   Not on file  Tobacco Use   Smoking status: Never   Smokeless tobacco: Never   Tobacco comments:    never used tobacco  Vaping Use   Vaping Use: Never used  Substance and Sexual Activity   Alcohol use: No    Alcohol/week: 0.0 standard drinks   Drug use: No   Sexual activity: Yes

## 2021-03-01 ENCOUNTER — Other Ambulatory Visit: Payer: Self-pay | Admitting: Family Medicine

## 2021-03-01 DIAGNOSIS — Z1231 Encounter for screening mammogram for malignant neoplasm of breast: Secondary | ICD-10-CM

## 2021-03-03 ENCOUNTER — Ambulatory Visit: Payer: Medicare Other

## 2021-03-04 ENCOUNTER — Ambulatory Visit
Admission: RE | Admit: 2021-03-04 | Discharge: 2021-03-04 | Disposition: A | Discharge status: Home / Self Care | Payer: Medicare Other | Source: Ambulatory Visit | Attending: Family Medicine | Admitting: Family Medicine

## 2021-03-04 ENCOUNTER — Other Ambulatory Visit: Payer: Self-pay

## 2021-03-04 ENCOUNTER — Encounter: Payer: Self-pay | Admitting: Hematology & Oncology

## 2021-03-04 DIAGNOSIS — Z1231 Encounter for screening mammogram for malignant neoplasm of breast: Secondary | ICD-10-CM

## 2021-03-08 ENCOUNTER — Other Ambulatory Visit: Payer: Self-pay

## 2021-03-08 ENCOUNTER — Inpatient Hospital Stay: Payer: Medicare Other | Attending: Hematology & Oncology

## 2021-03-08 ENCOUNTER — Inpatient Hospital Stay (HOSPITAL_BASED_OUTPATIENT_CLINIC_OR_DEPARTMENT_OTHER): Payer: Medicare Other | Admitting: Hematology & Oncology

## 2021-03-08 ENCOUNTER — Inpatient Hospital Stay: Payer: Medicare Other

## 2021-03-08 ENCOUNTER — Encounter: Payer: Self-pay | Admitting: Hematology & Oncology

## 2021-03-08 VITALS — BP 168/86 | HR 68 | Temp 98.1°F | Resp 18 | Wt 150.2 lb

## 2021-03-08 DIAGNOSIS — E119 Type 2 diabetes mellitus without complications: Secondary | ICD-10-CM | POA: Insufficient documentation

## 2021-03-08 DIAGNOSIS — N189 Chronic kidney disease, unspecified: Secondary | ICD-10-CM

## 2021-03-08 DIAGNOSIS — Z794 Long term (current) use of insulin: Secondary | ICD-10-CM | POA: Insufficient documentation

## 2021-03-08 DIAGNOSIS — D631 Anemia in chronic kidney disease: Secondary | ICD-10-CM | POA: Diagnosis present

## 2021-03-08 DIAGNOSIS — K9041 Non-celiac gluten sensitivity: Secondary | ICD-10-CM

## 2021-03-08 DIAGNOSIS — D509 Iron deficiency anemia, unspecified: Secondary | ICD-10-CM | POA: Diagnosis not present

## 2021-03-08 DIAGNOSIS — N1831 Chronic kidney disease, stage 3a: Secondary | ICD-10-CM

## 2021-03-08 LAB — CMP (CANCER CENTER ONLY)
ALT: 13 U/L (ref 0–44)
AST: 16 U/L (ref 15–41)
Albumin: 3.9 g/dL (ref 3.5–5.0)
Alkaline Phosphatase: 44 U/L (ref 38–126)
Anion gap: 9 (ref 5–15)
BUN: 28 mg/dL — ABNORMAL HIGH (ref 8–23)
CO2: 27 mmol/L (ref 22–32)
Calcium: 9.1 mg/dL (ref 8.9–10.3)
Chloride: 103 mmol/L (ref 98–111)
Creatinine: 1.19 mg/dL — ABNORMAL HIGH (ref 0.44–1.00)
GFR, Estimated: 50 mL/min — ABNORMAL LOW (ref >60)
Glucose, Bld: 147 mg/dL — ABNORMAL HIGH (ref 70–99)
Potassium: 5.1 mmol/L (ref 3.5–5.1)
Sodium: 139 mmol/L (ref 135–145)
Total Bilirubin: 0.3 mg/dL (ref 0.3–1.2)
Total Protein: 6.5 g/dL (ref 6.5–8.1)

## 2021-03-08 LAB — IRON AND TIBC
Iron: 111 ug/dL (ref 28–170)
Saturation Ratios: 38 % — ABNORMAL HIGH (ref 10.4–31.8)
TIBC: 293 ug/dL (ref 250–450)
UIBC: 182 ug/dL

## 2021-03-08 LAB — CBC WITH DIFFERENTIAL (CANCER CENTER ONLY)
Abs Immature Granulocytes: 0.11 10*3/uL — ABNORMAL HIGH (ref 0.00–0.07)
Basophils Absolute: 0 10*3/uL (ref 0.0–0.1)
Basophils Relative: 0 %
Eosinophils Absolute: 0.1 10*3/uL (ref 0.0–0.5)
Eosinophils Relative: 1 %
HCT: 40.5 % (ref 36.0–46.0)
Hemoglobin: 12.5 g/dL (ref 12.0–15.0)
Immature Granulocytes: 1 %
Lymphocytes Relative: 19 %
Lymphs Abs: 1.8 10*3/uL (ref 0.7–4.0)
MCH: 29.1 pg (ref 26.0–34.0)
MCHC: 30.9 g/dL (ref 30.0–36.0)
MCV: 94.2 fL (ref 80.0–100.0)
Monocytes Absolute: 0.6 10*3/uL (ref 0.1–1.0)
Monocytes Relative: 6 %
Neutro Abs: 6.8 10*3/uL (ref 1.7–7.7)
Neutrophils Relative %: 73 %
Platelet Count: 272 10*3/uL (ref 150–400)
RBC: 4.3 MIL/uL (ref 3.87–5.11)
RDW: 14.7 % (ref 11.5–15.5)
WBC Count: 9.5 10*3/uL (ref 4.0–10.5)
nRBC: 0 % (ref 0.0–0.2)

## 2021-03-08 LAB — RETICULOCYTES
Immature Retic Fract: 2.7 % (ref 2.3–15.9)
RBC.: 4.15 MIL/uL (ref 3.87–5.11)
Retic Count, Absolute: 26.1 10*3/uL (ref 19.0–186.0)
Retic Ct Pct: 0.6 % (ref 0.4–3.1)

## 2021-03-08 LAB — FERRITIN: Ferritin: 604 ng/mL — ABNORMAL HIGH (ref 11–307)

## 2021-03-08 NOTE — Progress Notes (Signed)
Hematology and Oncology Follow Up Visit  Kelly Soto [id number] [birth year] 65 y.o. 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  She looks great.  She got over the [place].  She really had no problems from the [place].  She is under a lot of stress right now.  Both her parents are in nursing homes.  Her mother has progressive dementia.  She said her blood sugars were 105 this morning.  We checked, her blood sugar was 147.  Her last iron studies back in August showed a ferritin of 553 with iron saturation of 58%.  She has had no problems with bowels or bladder.  She has had no cough or shortness of breath.  She has had no nausea or vomiting.  There is been no tingling in the hands or feet.  She has had no leg swelling.  There is been no rashes.  Overall, her performance status is ECOG 1.      Medications:  Current Outpatient Medications:    Ascorbic Acid (VITAMIN C) 1000 MG tablet, Take 1,000 mg by mouth daily., Disp: , Rfl:    baclofen (LIORESAL) 10 MG tablet, 1 tablet with food or milk as needed, Disp: , Rfl:    BD PEN NEEDLE NANO U/F 32G X 4 MM MISC, USE 1 ONCE DAILY WITH TRESIBA, Disp: , Rfl:    betamethasone valerate (VALISONE) 0.1 % cream, Apply 1 application topically daily as needed (irritation)., Disp: , Rfl:    Blood Glucose Monitoring Suppl (CONTOUR NEXT EZ) w/Device KIT, USE TO CHECK BLOOD SUGARS DAILY, Disp: , Rfl:    CONTOUR NEXT TEST test strip, USE STRIP TO CHECK GLUCOSE ONCE DAILY, Disp: , Rfl:    denosumab (PROLIA) 60 MG/ML SOLN injection, Inject 60 mg into the skin every 6 (six) months. Administer in upper arm, thigh, or abdomen  Next  injection is due 05-2016, Disp: , Rfl:    diazepam (VALIUM) 5 MG tablet, Take 5 mg by mouth daily as needed for anxiety. , Disp: , Rfl: 0   fluconazole (DIFLUCAN) 150 MG tablet, Take 150 mg by mouth as needed., Disp: , Rfl:    fluticasone (FLONASE) 50 MCG/ACT nasal spray, Place 2 sprays into both nostrils at bedtime., Disp: , Rfl:    Galcanezumab-gnlm (EMGALITY) 120 MG/ML SOAJ, Inject 120 mg into the skin every 30 (thirty) days., Disp: , Rfl:    glyBURIDE-metformin (GLUCOVANCE) 2.5-500 MG per tablet, Take 2 tablets by mouth 2 (two) times daily., Disp: , Rfl:    hydroxypropyl methylcellulose / hypromellose (ISOPTO TEARS / GONIOVISC) 2.5 % ophthalmic solution, Place 1 drop into both eyes 3 (three) times daily as needed for dry eyes. , Disp: , Rfl:    metoprolol succinate (TOPROL-XL) 50 MG 24 hr tablet, Take 100 mg by mouth every morning., Disp: , Rfl:    Microlet Lancets MISC, USE AS DIRECTED TO CHECK BLOOD SUGAR, Disp: , Rfl:    omeprazole (PRILOSEC) 40 MG capsule, Take 40 mg by mouth 2 (two) times daily., Disp: , Rfl:    ondansetron (ZOFRAN ODT) 4 MG disintegrating tablet, Take 1 tablet (4 mg total) by mouth every 8 (eight) hours as needed for  nausea or vomiting., Disp: 15 tablet, Rfl: 0   pioglitazone (ACTOS) 45 MG tablet, Take 45 mg by mouth daily., Disp: , Rfl:    Simethicone (GAS FREE EXTRA STRENGTH PO), Take 2 tablets by mouth at bedtime., Disp: , Rfl:    sodium chloride (OCEAN) 0.65 % nasal spray, 2 sprays, Disp: , Rfl:    tobramycin (TOBREX) 0.3 % ophthalmic solution, Place 1 drop into both eyes See admin instructions. Instill one drop into the affected eye four times a day prior and the day after eye injection., Disp: , Rfl:    traMADol (ULTRAM) 50 MG tablet, Take 1 tablet (50 mg total) by mouth at bedtime as needed., Disp: 20 tablet, Rfl: 0   TRESIBA FLEXTOUCH 100 UNIT/ML SOPN FlexTouch Pen, Inject 12 Units into the skin daily. , Disp: , Rfl:    triamcinolone (KENALOG) 0.147 MG/GM topical  spray, 1 application, Disp: , Rfl:    guaiFENesin (ROBITUSSIN) 100 MG/5ML liquid, Take 5-10 mLs (100-200 mg total) by mouth every 4 (four) hours as needed for cough., Disp: 60 mL, Rfl: 0  Allergies:  Allergies  Allergen Reactions   Ace Inhibitors Other (See Comments) and Anaphylaxis    Angioedema   Aspirin Other (See Comments)    Cannot take due to gastric bypass surgery   Nsaids Anaphylaxis and Other (See Comments)    S/p gastric bypass Cannot take orally due to gastric bypass S/p gastric bypass   Sular [Nisoldipine Er] Other (See Comments)    angioedema   Nisoldipine Swelling and Other (See Comments)   Other Other (See Comments)   Boniva [Ibandronic Acid] Other (See Comments)    Joint pain    Past Medical History, Surgical history, Social history, and Family History were reviewed and updated.  Review of Systems: Review of Systems  Constitutional: Negative.   HENT: Negative.    Eyes: Negative.   Respiratory: Negative.    Cardiovascular: Negative.   Gastrointestinal: Negative.   Genitourinary: Negative.   Musculoskeletal: Negative.   Skin: Negative.   Neurological: Negative.   Endo/Heme/Allergies: Negative.   Psychiatric/Behavioral: Negative.     Physical Exam:  weight is 150 lb 4 oz (68.2 kg). Her oral temperature is 98.1 F (36.7 C). Her blood pressure is 168/86 (abnormal) and her pulse is 68. Her respiration is 18 and oxygen saturation is 100%.   Physical Exam Vitals reviewed.  HENT:     Head: Normocephalic and atraumatic.  Eyes:     Pupils: Pupils are equal, round, and reactive to light.  Cardiovascular:     Rate and Rhythm: Normal rate and regular rhythm.     Heart sounds: Normal heart sounds.  Pulmonary:     Effort: Pulmonary effort is normal.     Breath sounds: Normal breath sounds.  Abdominal:     General: Bowel sounds are normal.     Palpations: Abdomen is soft.  Musculoskeletal:        General: No tenderness or deformity. Normal range of motion.      Cervical back: Normal range of motion.  Lymphadenopathy:     Cervical: No cervical adenopathy.  Skin:    General: Skin is warm and dry.     Findings: No erythema or rash.  Neurological:     Mental Status: She is alert and oriented to person, place, and time.  Psychiatric:        Behavior: Behavior normal.        Thought Content: Thought content normal.  Judgment: Judgment normal.     Lab Results  Component Value Date   WBC 9.5 03/08/2021   HGB 12.5 03/08/2021   HCT 40.5 03/08/2021   MCV 94.2 03/08/2021   PLT 272 03/08/2021     Chemistry      Component Value Date/Time   NA 139 03/08/2021 0743   NA 145 05/10/2017 0748   NA 137 12/06/2016 0746   K 5.1 03/08/2021 0743   K 4.7 05/10/2017 0748   K 5.2 (H) 12/06/2016 0746   CL 103 03/08/2021 0743   CL 110 (H) 05/10/2017 0748   CO2 27 03/08/2021 0743   CO2 23 05/10/2017 0748   CO2 23 12/06/2016 0746   BUN 28 (H) 03/08/2021 0743   BUN 28 (H) 05/10/2017 0748   BUN 26.1 (H) 12/06/2016 0746   CREATININE 1.19 (H) 03/08/2021 0743   CREATININE 1.2 05/10/2017 0748   CREATININE 1.2 (H) 12/06/2016 0746      Component Value Date/Time   CALCIUM 9.1 03/08/2021 0743   CALCIUM 9.1 05/10/2017 0748   CALCIUM 9.1 12/06/2016 0746   ALKPHOS 44 03/08/2021 0743   ALKPHOS 61 05/10/2017 0748   ALKPHOS 41 12/06/2016 0746   AST 16 03/08/2021 0743   AST 23 12/06/2016 0746   ALT 13 03/08/2021 0743   ALT 31 05/10/2017 0748   ALT 26 12/06/2016 0746   BILITOT 0.3 03/08/2021 0743   BILITOT 0.26 12/06/2016 0746       Impression and Plan: Kelly Soto is 65 year old white female. She has diabetes. She has iron deficiency anemia. She has a very low erythropoietin level.  I am amazed as well her blood count looks today.  Thankfully, she does not need any Aranesp.  I would be shocked if her iron studies were on the low side.  I think we get her back now in 5 or 6 weeks.  Hopefully, we can start to bring her appointments out little bit  longer so she will not have to worry about going over to our office during the holiday season.    Volanda Napoleon, MD 9/13/20228:22 AM

## 2021-04-12 ENCOUNTER — Inpatient Hospital Stay (HOSPITAL_BASED_OUTPATIENT_CLINIC_OR_DEPARTMENT_OTHER): Payer: Medicare Other | Admitting: Hematology & Oncology

## 2021-04-12 ENCOUNTER — Inpatient Hospital Stay: Payer: Medicare Other

## 2021-04-12 ENCOUNTER — Other Ambulatory Visit: Payer: Self-pay

## 2021-04-12 ENCOUNTER — Inpatient Hospital Stay: Payer: Medicare Other | Attending: Hematology & Oncology

## 2021-04-12 ENCOUNTER — Encounter: Payer: Self-pay | Admitting: Hematology & Oncology

## 2021-04-12 ENCOUNTER — Telehealth: Payer: Self-pay | Admitting: Hematology & Oncology

## 2021-04-12 VITALS — BP 186/77 | HR 66 | Temp 98.2°F | Resp 18 | Wt 153.0 lb

## 2021-04-12 VITALS — BP 191/78

## 2021-04-12 DIAGNOSIS — D631 Anemia in chronic kidney disease: Secondary | ICD-10-CM

## 2021-04-12 DIAGNOSIS — N189 Chronic kidney disease, unspecified: Secondary | ICD-10-CM

## 2021-04-12 DIAGNOSIS — N183 Chronic kidney disease, stage 3 unspecified: Secondary | ICD-10-CM | POA: Insufficient documentation

## 2021-04-12 LAB — CBC WITH DIFFERENTIAL (CANCER CENTER ONLY)
Abs Immature Granulocytes: 0.08 10*3/uL — ABNORMAL HIGH (ref 0.00–0.07)
Basophils Absolute: 0.1 10*3/uL (ref 0.0–0.1)
Basophils Relative: 1 %
Eosinophils Absolute: 0.1 10*3/uL (ref 0.0–0.5)
Eosinophils Relative: 1 %
HCT: 34.6 % — ABNORMAL LOW (ref 36.0–46.0)
Hemoglobin: 10.7 g/dL — ABNORMAL LOW (ref 12.0–15.0)
Immature Granulocytes: 1 %
Lymphocytes Relative: 19 %
Lymphs Abs: 1.5 10*3/uL (ref 0.7–4.0)
MCH: 29.2 pg (ref 26.0–34.0)
MCHC: 30.9 g/dL (ref 30.0–36.0)
MCV: 94.5 fL (ref 80.0–100.0)
Monocytes Absolute: 0.6 10*3/uL (ref 0.1–1.0)
Monocytes Relative: 8 %
Neutro Abs: 5.4 10*3/uL (ref 1.7–7.7)
Neutrophils Relative %: 70 %
Platelet Count: 248 10*3/uL (ref 150–400)
RBC: 3.66 MIL/uL — ABNORMAL LOW (ref 3.87–5.11)
RDW: 14.6 % (ref 11.5–15.5)
WBC Count: 7.8 10*3/uL (ref 4.0–10.5)
nRBC: 0 % (ref 0.0–0.2)

## 2021-04-12 LAB — IRON AND TIBC
Iron: 105 ug/dL (ref 41–142)
Saturation Ratios: 41 % (ref 21–57)
TIBC: 255 ug/dL (ref 236–444)
UIBC: 150 ug/dL (ref 120–384)

## 2021-04-12 LAB — CMP (CANCER CENTER ONLY)
ALT: 18 U/L (ref 0–44)
AST: 19 U/L (ref 15–41)
Albumin: 3.9 g/dL (ref 3.5–5.0)
Alkaline Phosphatase: 44 U/L (ref 38–126)
Anion gap: 8 (ref 5–15)
BUN: 27 mg/dL — ABNORMAL HIGH (ref 8–23)
CO2: 28 mmol/L (ref 22–32)
Calcium: 9.6 mg/dL (ref 8.9–10.3)
Chloride: 104 mmol/L (ref 98–111)
Creatinine: 1.11 mg/dL — ABNORMAL HIGH (ref 0.44–1.00)
GFR, Estimated: 55 mL/min — ABNORMAL LOW (ref >60)
Glucose, Bld: 96 mg/dL (ref 70–99)
Potassium: 4.9 mmol/L (ref 3.5–5.1)
Sodium: 140 mmol/L (ref 135–145)
Total Bilirubin: 0.3 mg/dL (ref 0.3–1.2)
Total Protein: 6.8 g/dL (ref 6.5–8.1)

## 2021-04-12 LAB — RETICULOCYTES
Immature Retic Fract: 6 % (ref 2.3–15.9)
RBC.: 3.6 MIL/uL — ABNORMAL LOW (ref 3.87–5.11)
Retic Count, Absolute: 41.8 10*3/uL (ref 19.0–186.0)
Retic Ct Pct: 1.2 % (ref 0.4–3.1)

## 2021-04-12 LAB — FERRITIN: Ferritin: 779 ng/mL — ABNORMAL HIGH (ref 11–307)

## 2021-04-12 MED ORDER — DARBEPOETIN ALFA 300 MCG/0.6ML IJ SOSY
300.0000 ug | PREFILLED_SYRINGE | Freq: Once | INTRAMUSCULAR | Status: AC
  Administered 2021-04-12: 300 ug via SUBCUTANEOUS
  Filled 2021-04-12: qty 0.6

## 2021-04-12 NOTE — Progress Notes (Signed)
Hematology and Oncology Follow Up Visit  Kelly Soto [id number] 10-Mar-1956 65 y.o. 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 is back for follow-up.  She is going through a little bit of a tough time right now.  Her father passed away a couple weeks ago.  Her mother is not doing all that well right now.  He was 66 years old.  I think she has an element of dementia.  Otherwise, she seems to be handling herself well.  She is oriented so eloquent.  Thankfully, her blood sugar is 196 today.  When we last saw her in September, her ferritin was 604 with an iron saturation of 38%.  She was responds well to Aranesp.  We will going give her a dose today.  She has had no problems with fever.  She has had no nausea or vomiting.  There is no change in bowel or bladder habits.  She has had no rashes.  She has had no cough.  There is no headache.  Overall, her performance status is ECOG 1.        Medications:  Current Outpatient Medications:    Ascorbic Acid (VITAMIN C) 1000 MG tablet, Take 1,000 mg by mouth daily., Disp: , Rfl:    baclofen (LIORESAL) 10 MG tablet, 1 tablet with food or milk as needed, Disp: , Rfl:    BD PEN NEEDLE NANO U/F 32G X 4 MM MISC, USE 1 ONCE DAILY WITH TRESIBA, Disp: , Rfl:    betamethasone valerate (VALISONE) 0.1 % cream, Apply 1 application topically daily as needed (irritation)., Disp: , Rfl:    Blood Glucose Monitoring Suppl (CONTOUR NEXT EZ) w/Device KIT, USE TO CHECK BLOOD SUGARS DAILY, Disp: , Rfl:    CONTOUR NEXT TEST test strip, USE STRIP TO CHECK GLUCOSE ONCE DAILY, Disp: , Rfl:    denosumab (PROLIA) 60 MG/ML SOLN injection, Inject 60 mg into the skin every 6 (six) months.  Administer in upper arm, thigh, or abdomen  Next injection is due 05-2016, Disp: , Rfl:    diazepam (VALIUM) 5 MG tablet, Take 5 mg by mouth daily as needed for anxiety. , Disp: , Rfl: 0   fluconazole (DIFLUCAN) 150 MG tablet, Take 150 mg by mouth as needed., Disp: , Rfl:    fluticasone (FLONASE) 50 MCG/ACT nasal spray, Place 2 sprays into both nostrils at bedtime., Disp: , Rfl:    Galcanezumab-gnlm (EMGALITY) 120 MG/ML SOAJ, Inject 120 mg into the skin every 30 (thirty) days., Disp: , Rfl:    glyBURIDE-metformin (GLUCOVANCE) 2.5-500 MG per tablet, Take 2 tablets by mouth 2 (two) times daily., Disp: , Rfl:    hydroxypropyl methylcellulose / hypromellose (ISOPTO TEARS / GONIOVISC) 2.5 % ophthalmic solution, Place 1 drop into both eyes 3 (three) times daily as needed for dry eyes. , Disp: , Rfl:    metoprolol succinate (TOPROL-XL) 50 MG 24 hr tablet, Take 100 mg by mouth every morning., Disp: , Rfl:    Microlet Lancets MISC, USE AS DIRECTED TO CHECK BLOOD SUGAR, Disp: , Rfl:    omeprazole (PRILOSEC) 40 MG capsule, Take 40 mg by mouth 2 (two) times daily., Disp: , Rfl:    ondansetron (ZOFRAN ODT) 4 MG disintegrating  tablet, Take 1 tablet (4 mg total) by mouth every 8 (eight) hours as needed for nausea or vomiting., Disp: 15 tablet, Rfl: 0   pioglitazone (ACTOS) 45 MG tablet, Take 45 mg by mouth daily., Disp: , Rfl:    Simethicone (GAS FREE EXTRA STRENGTH PO), Take 2 tablets by mouth at bedtime., Disp: , Rfl:    sodium chloride (OCEAN) 0.65 % nasal spray, 2 sprays, Disp: , Rfl:    tobramycin (TOBREX) 0.3 % ophthalmic solution, Place 1 drop into both eyes See admin instructions. Instill one drop into the affected eye four times a day prior and the day after eye injection., Disp: , Rfl:    traMADol (ULTRAM) 50 MG tablet, Take 1 tablet (50 mg total) by mouth at bedtime as needed., Disp: 20 tablet, Rfl: 0   TRESIBA FLEXTOUCH 100 UNIT/ML SOPN FlexTouch Pen, Inject 12 Units into the skin daily. , Disp: , Rfl:     triamcinolone (KENALOG) 0.147 MG/GM topical spray, 1 application, Disp: , Rfl:    guaiFENesin (ROBITUSSIN) 100 MG/5ML liquid, Take 5-10 mLs (100-200 mg total) by mouth every 4 (four) hours as needed for cough., Disp: 60 mL, Rfl: 0  Allergies:  Allergies  Allergen Reactions   Ace Inhibitors Other (See Comments) and Anaphylaxis    Angioedema   Aspirin Other (See Comments)    Cannot take due to gastric bypass surgery   Nsaids Anaphylaxis and Other (See Comments)    S/p gastric bypass Cannot take orally due to gastric bypass S/p gastric bypass   Sular [Nisoldipine Er] Other (See Comments)    angioedema   Nisoldipine Swelling and Other (See Comments)   Other Other (See Comments)   Boniva [Ibandronic Acid] Other (See Comments)    Joint pain    Past Medical History, Surgical history, Social history, and Family History were reviewed and updated.  Review of Systems: Review of Systems  Constitutional: Negative.   HENT: Negative.    Eyes: Negative.   Respiratory: Negative.    Cardiovascular: Negative.   Gastrointestinal: Negative.   Genitourinary: Negative.   Musculoskeletal: Negative.   Skin: Negative.   Neurological: Negative.   Endo/Heme/Allergies: Negative.   Psychiatric/Behavioral: Negative.     Physical Exam:  weight is 153 lb (69.4 kg). Her oral temperature is 98.2 F (36.8 C). Her blood pressure is 186/77 (abnormal) and her pulse is 66. Her respiration is 18 and oxygen saturation is 100%.   Physical Exam Vitals reviewed.  HENT:     Head: Normocephalic and atraumatic.  Eyes:     Pupils: Pupils are equal, round, and reactive to light.  Cardiovascular:     Rate and Rhythm: Normal rate and regular rhythm.     Heart sounds: Normal heart sounds.  Pulmonary:     Effort: Pulmonary effort is normal.     Breath sounds: Normal breath sounds.  Abdominal:     General: Bowel sounds are normal.     Palpations: Abdomen is soft.  Musculoskeletal:        General: No  tenderness or deformity. Normal range of motion.     Cervical back: Normal range of motion.  Lymphadenopathy:     Cervical: No cervical adenopathy.  Skin:    General: Skin is warm and dry.     Findings: No erythema or rash.  Neurological:     Mental Status: She is alert and oriented to person, place, and time.  Psychiatric:        Behavior: Behavior normal.  Thought Content: Thought content normal.        Judgment: Judgment normal.     Lab Results  Component Value Date   WBC 7.8 04/12/2021   HGB 10.7 (L) 04/12/2021   HCT 34.6 (L) 04/12/2021   MCV 94.5 04/12/2021   PLT 248 04/12/2021     Chemistry      Component Value Date/Time   NA 140 04/12/2021 0745   NA 145 05/10/2017 0748   NA 137 12/06/2016 0746   K 4.9 04/12/2021 0745   K 4.7 05/10/2017 0748   K 5.2 (H) 12/06/2016 0746   CL 104 04/12/2021 0745   CL 110 (H) 05/10/2017 0748   CO2 28 04/12/2021 0745   CO2 23 05/10/2017 0748   CO2 23 12/06/2016 0746   BUN 27 (H) 04/12/2021 0745   BUN 28 (H) 05/10/2017 0748   BUN 26.1 (H) 12/06/2016 0746   CREATININE 1.11 (H) 04/12/2021 0745   CREATININE 1.2 05/10/2017 0748   CREATININE 1.2 (H) 12/06/2016 0746      Component Value Date/Time   CALCIUM 9.6 04/12/2021 0745   CALCIUM 9.1 05/10/2017 0748   CALCIUM 9.1 12/06/2016 0746   ALKPHOS 44 04/12/2021 0745   ALKPHOS 61 05/10/2017 0748   ALKPHOS 41 12/06/2016 0746   AST 19 04/12/2021 0745   AST 23 12/06/2016 0746   ALT 18 04/12/2021 0745   ALT 31 05/10/2017 0748   ALT 26 12/06/2016 0746   BILITOT 0.3 04/12/2021 0745   BILITOT 0.26 12/06/2016 0746       Impression and Plan: Kelly Soto is 65 year old white female. She has diabetes. She has iron deficiency anemia. She has a very low erythropoietin level.  Her blood count is not all that bad.  However, her hemoglobin is below 11 so we will go ahead and give her dose of Aranesp.  This really should be able to get her through Thanksgiving.  Her know that she has  been always close to her parents.  I have actually seen her mom before.  Her mom had early stage breast cancer.  We will plan to get her back after Thanksgiving now.  We should be able to get her through Thanksgiving without any problems.    Volanda Napoleon, MD 10/18/20228:22 AM

## 2021-04-12 NOTE — Progress Notes (Signed)
OK for Aranesp today with BP 186/77 and repeat BP 191/78 per order of Dr. Marin Olp.

## 2021-04-12 NOTE — Patient Instructions (Signed)
Darbepoetin Alfa injection What is this medication? DARBEPOETIN ALFA (dar be POE e tin AL fa) helps your body make more red blood cells. It is used to treat anemia caused by chronic kidney failure and chemotherapy. This medicine may be used for other purposes; ask your health care provider or pharmacist if you have questions. COMMON BRAND NAME(S): Aranesp What should I tell my care team before I take this medication? They need to know if you have any of these conditions: blood clotting disorders or history of blood clots cancer patient not on chemotherapy cystic fibrosis heart disease, such as angina, heart failure, or a history of a heart attack hemoglobin level of 12 g/dL or greater high blood pressure low levels of folate, iron, or vitamin B12 seizures an unusual or allergic reaction to darbepoetin, erythropoietin, albumin, hamster proteins, latex, other medicines, foods, dyes, or preservatives pregnant or trying to get pregnant breast-feeding How should I use this medication? This medicine is for injection into a vein or under the skin. It is usually given by a health care professional in a hospital or clinic setting. If you get this medicine at home, you will be taught how to prepare and give this medicine. Use exactly as directed. Take your medicine at regular intervals. Do not take your medicine more often than directed. It is important that you put your used needles and syringes in a special sharps container. Do not put them in a trash can. If you do not have a sharps container, call your pharmacist or healthcare provider to get one. A special MedGuide will be given to you by the pharmacist with each prescription and refill. Be sure to read this information carefully each time. Talk to your pediatrician regarding the use of this medicine in children. While this medicine may be used in children as young as 1 month of age for selected conditions, precautions do apply. Overdosage: If you  think you have taken too much of this medicine contact a poison control center or emergency room at once. NOTE: This medicine is only for you. Do not share this medicine with others. What if I miss a dose? If you miss a dose, take it as soon as you can. If it is almost time for your next dose, take only that dose. Do not take double or extra doses. What may interact with this medication? Do not take this medicine with any of the following medications: epoetin alfa This list may not describe all possible interactions. Give your health care provider a list of all the medicines, herbs, non-prescription drugs, or dietary supplements you use. Also tell them if you smoke, drink alcohol, or use illegal drugs. Some items may interact with your medicine. What should I watch for while using this medication? Your condition will be monitored carefully while you are receiving this medicine. You may need blood work done while you are taking this medicine. This medicine may cause a decrease in vitamin B6. You should make sure that you get enough vitamin B6 while you are taking this medicine. Discuss the foods you eat and the vitamins you take with your health care professional. What side effects may I notice from receiving this medication? Side effects that you should report to your doctor or health care professional as soon as possible: allergic reactions like skin rash, itching or hives, swelling of the face, lips, or tongue breathing problems changes in vision chest pain confusion, trouble speaking or understanding feeling faint or lightheaded, falls high blood pressure   muscle aches or pains pain, swelling, warmth in the leg rapid weight gain severe headaches sudden numbness or weakness of the face, arm or leg trouble walking, dizziness, loss of balance or coordination seizures (convulsions) swelling of the ankles, feet, hands unusually weak or tired Side effects that usually do not require medical  attention (report to your doctor or health care professional if they continue or are bothersome): diarrhea fever, chills (flu-like symptoms) headaches nausea, vomiting redness, stinging, or swelling at site where injected This list may not describe all possible side effects. Call your doctor for medical advice about side effects. You may report side effects to FDA at 1-800-FDA-1088. Where should I keep my medication? Keep out of the reach of children. Store in a refrigerator between 2 and 8 degrees C (36 and 46 degrees F). Do not freeze. Do not shake. Throw away any unused portion if using a single-dose vial. Throw away any unused medicine after the expiration date. NOTE: This sheet is a summary. It may not cover all possible information. If you have questions about this medicine, talk to your doctor, pharmacist, or health care provider.  2022 Elsevier/Gold Standard (2017-06-27 16:44:20)  

## 2021-04-12 NOTE — Telephone Encounter (Signed)
Scheduled appt per 10/18 LOS _ called patient and left voicemail with appt date and time

## 2021-05-24 ENCOUNTER — Inpatient Hospital Stay: Payer: Medicare Other | Admitting: Hematology & Oncology

## 2021-05-24 ENCOUNTER — Inpatient Hospital Stay: Payer: Medicare Other

## 2021-05-24 ENCOUNTER — Ambulatory Visit: Payer: Medicare Other

## 2021-06-03 ENCOUNTER — Encounter: Payer: Self-pay | Admitting: Hematology & Oncology

## 2021-06-03 ENCOUNTER — Ambulatory Visit: Payer: Medicare Other

## 2021-06-03 ENCOUNTER — Inpatient Hospital Stay (HOSPITAL_BASED_OUTPATIENT_CLINIC_OR_DEPARTMENT_OTHER): Payer: Medicare Other | Admitting: Hematology & Oncology

## 2021-06-03 ENCOUNTER — Other Ambulatory Visit: Payer: Self-pay

## 2021-06-03 ENCOUNTER — Inpatient Hospital Stay: Payer: Medicare Other | Attending: Hematology & Oncology

## 2021-06-03 ENCOUNTER — Telehealth: Payer: Self-pay | Admitting: *Deleted

## 2021-06-03 VITALS — BP 172/69 | HR 64 | Temp 98.8°F | Resp 16 | Wt 153.0 lb

## 2021-06-03 DIAGNOSIS — N189 Chronic kidney disease, unspecified: Secondary | ICD-10-CM

## 2021-06-03 DIAGNOSIS — D509 Iron deficiency anemia, unspecified: Secondary | ICD-10-CM | POA: Insufficient documentation

## 2021-06-03 DIAGNOSIS — D631 Anemia in chronic kidney disease: Secondary | ICD-10-CM | POA: Diagnosis not present

## 2021-06-03 DIAGNOSIS — N182 Chronic kidney disease, stage 2 (mild): Secondary | ICD-10-CM

## 2021-06-03 DIAGNOSIS — Z794 Long term (current) use of insulin: Secondary | ICD-10-CM | POA: Insufficient documentation

## 2021-06-03 DIAGNOSIS — E119 Type 2 diabetes mellitus without complications: Secondary | ICD-10-CM | POA: Diagnosis not present

## 2021-06-03 LAB — CMP (CANCER CENTER ONLY)
ALT: 33 U/L (ref 0–44)
AST: 34 U/L (ref 15–41)
Albumin: 3.8 g/dL (ref 3.5–5.0)
Alkaline Phosphatase: 47 U/L (ref 38–126)
Anion gap: 7 (ref 5–15)
BUN: 31 mg/dL — ABNORMAL HIGH (ref 8–23)
CO2: 27 mmol/L (ref 22–32)
Calcium: 9.4 mg/dL (ref 8.9–10.3)
Chloride: 103 mmol/L (ref 98–111)
Creatinine: 1.18 mg/dL — ABNORMAL HIGH (ref 0.44–1.00)
GFR, Estimated: 51 mL/min — ABNORMAL LOW (ref >60)
Glucose, Bld: 204 mg/dL — ABNORMAL HIGH (ref 70–99)
Potassium: 6.4 mmol/L (ref 3.5–5.1)
Sodium: 137 mmol/L (ref 135–145)
Total Bilirubin: 0.2 mg/dL — ABNORMAL LOW (ref 0.3–1.2)
Total Protein: 6.3 g/dL — ABNORMAL LOW (ref 6.5–8.1)

## 2021-06-03 LAB — IRON AND TIBC
Iron: 118 ug/dL (ref 41–142)
Saturation Ratios: 47 % (ref 21–57)
TIBC: 251 ug/dL (ref 236–444)
UIBC: 134 ug/dL (ref 120–384)

## 2021-06-03 LAB — RETICULOCYTES
Immature Retic Fract: 4.6 % (ref 2.3–15.9)
RBC.: 4.06 MIL/uL (ref 3.87–5.11)
Retic Count, Absolute: 23.1 10*3/uL (ref 19.0–186.0)
Retic Ct Pct: 0.6 % (ref 0.4–3.1)

## 2021-06-03 LAB — CBC WITH DIFFERENTIAL (CANCER CENTER ONLY)
Abs Immature Granulocytes: 0.02 10*3/uL (ref 0.00–0.07)
Basophils Absolute: 0.1 10*3/uL (ref 0.0–0.1)
Basophils Relative: 1 %
Eosinophils Absolute: 0.1 10*3/uL (ref 0.0–0.5)
Eosinophils Relative: 2 %
HCT: 38.1 % (ref 36.0–46.0)
Hemoglobin: 11.9 g/dL — ABNORMAL LOW (ref 12.0–15.0)
Immature Granulocytes: 0 %
Lymphocytes Relative: 20 %
Lymphs Abs: 1.7 10*3/uL (ref 0.7–4.0)
MCH: 29.3 pg (ref 26.0–34.0)
MCHC: 31.2 g/dL (ref 30.0–36.0)
MCV: 93.8 fL (ref 80.0–100.0)
Monocytes Absolute: 0.6 10*3/uL (ref 0.1–1.0)
Monocytes Relative: 7 %
Neutro Abs: 5.9 10*3/uL (ref 1.7–7.7)
Neutrophils Relative %: 70 %
Platelet Count: 243 10*3/uL (ref 150–400)
RBC: 4.06 MIL/uL (ref 3.87–5.11)
RDW: 14.6 % (ref 11.5–15.5)
WBC Count: 8.4 10*3/uL (ref 4.0–10.5)
nRBC: 0 % (ref 0.0–0.2)

## 2021-06-03 LAB — FERRITIN: Ferritin: 688 ng/mL — ABNORMAL HIGH (ref 11–307)

## 2021-06-03 NOTE — Progress Notes (Signed)
Hematology and Oncology Follow Up Visit  Kelly Soto [id number] 1956/02/18 65 y.o. 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 is back for follow-up.  Unfortunately, her mother passed away in mid 06-13-2023.  Her father passed away in 04-13-23.  I know that her mother has some elements of dementia.  This is been tough on Kelly Soto.  I know that Thanksgiving was a little bit somber.  Today, her potassium was 6.4.  Her potassium is typically on the higher side.  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  We did do an EKG on her.  EKG did not show any peaked T waves.  She sees her family doctor for blood pressure check next Tuesday.  We will make sure that there we will check her potassium.  Her blood sugar is on the higher side.  I am sure this is probably from having some to eat this morning.  She has had no problems with bleeding.  There is no change in bowel or bladder habits.   Her iron studies back in October showed a ferritin of 779 with an iron saturation 41%.  Overall, her performance status is ECOG 1.     Medications:  Current Outpatient Medications:    chlorthalidone (HYGROTON) 25 MG tablet, Take 25 mg by mouth daily., Disp: , Rfl:    Ascorbic Acid (VITAMIN C) 1000 MG tablet, Take 1,000 mg by mouth daily., Disp: , Rfl:    baclofen (LIORESAL) 10 MG tablet, 1 tablet with food or milk as needed, Disp: , Rfl:    BD PEN NEEDLE NANO U/F 32G X 4 MM MISC, USE 1 ONCE DAILY WITH TRESIBA, Disp: , Rfl:    betamethasone valerate (VALISONE) 0.1 % cream, Apply 1 application topically daily as needed (irritation)., Disp: , Rfl:    Blood Glucose Monitoring Suppl (CONTOUR NEXT EZ) w/Device KIT, USE TO CHECK  BLOOD SUGARS DAILY, Disp: , Rfl:    CONTOUR NEXT TEST test strip, USE STRIP TO CHECK GLUCOSE ONCE DAILY, Disp: , Rfl:    denosumab (PROLIA) 60 MG/ML SOLN injection, Inject 60 mg into the skin every 6 (six) months. Administer in upper arm, thigh, or abdomen  Next injection is due 05-2016, Disp: , Rfl:    diazepam (VALIUM) 5 MG tablet, Take 5 mg by mouth daily as needed for anxiety. , Disp: , Rfl: 0   fluconazole (DIFLUCAN) 150 MG tablet, Take 150 mg by mouth as needed., Disp: , Rfl:    fluticasone (FLONASE) 50 MCG/ACT nasal spray, Place 2 sprays into both nostrils at bedtime., Disp: , Rfl:    Galcanezumab-gnlm (EMGALITY) 120 MG/ML SOAJ, Inject 120 mg into the skin every 30 (thirty) days., Disp: , Rfl:    glyBURIDE-metformin (GLUCOVANCE) 2.5-500 MG per tablet, Take 2 tablets by mouth 2 (two) times daily., Disp: , Rfl:    guaiFENesin (ROBITUSSIN) 100 MG/5ML liquid, Take 5-10 mLs (100-200 mg total) by mouth every 4 (four) hours as needed for cough., Disp: 60 mL, Rfl: 0   hydroxypropyl methylcellulose / hypromellose (ISOPTO TEARS / GONIOVISC) 2.5 % ophthalmic solution, Place 1 drop into both eyes 3 (three) times daily as needed for dry eyes. , Disp: , Rfl:    metoprolol  succinate (TOPROL-XL) 100 MG 24 hr tablet, Take 100 mg by mouth daily., Disp: , Rfl:    Microlet Lancets MISC, USE AS DIRECTED TO CHECK BLOOD SUGAR, Disp: , Rfl:    omeprazole (PRILOSEC) 40 MG capsule, Take 40 mg by mouth 2 (two) times daily., Disp: , Rfl:    ondansetron (ZOFRAN ODT) 4 MG disintegrating tablet, Take 1 tablet (4 mg total) by mouth every 8 (eight) hours as needed for nausea or vomiting., Disp: 15 tablet, Rfl: 0   pioglitazone (ACTOS) 45 MG tablet, Take 45 mg by mouth daily., Disp: , Rfl:    Simethicone (GAS FREE EXTRA STRENGTH PO), Take 2 tablets by mouth at bedtime., Disp: , Rfl:    sodium chloride (OCEAN) 0.65 % nasal spray, 2 sprays, Disp: , Rfl:    tobramycin (TOBREX) 0.3 % ophthalmic solution, Place 1 drop into both  eyes See admin instructions. Instill one drop into the affected eye four times a day prior and the day after eye injection., Disp: , Rfl:    traMADol (ULTRAM) 50 MG tablet, Take 1 tablet (50 mg total) by mouth at bedtime as needed., Disp: 20 tablet, Rfl: 0   TRESIBA FLEXTOUCH 100 UNIT/ML SOPN FlexTouch Pen, Inject 12 Units into the skin daily. , Disp: , Rfl:    triamcinolone (KENALOG) 0.147 MG/GM topical spray, 1 application, Disp: , Rfl:   Allergies:  Allergies  Allergen Reactions   Ace Inhibitors Other (See Comments) and Anaphylaxis    Angioedema   Aspirin Other (See Comments)    Cannot take due to gastric bypass surgery   Nsaids Anaphylaxis and Other (See Comments)    S/p gastric bypass Cannot take orally due to gastric bypass S/p gastric bypass   Sular [Nisoldipine Er] Other (See Comments)    angioedema   Nisoldipine Swelling and Other (See Comments)   Other Other (See Comments)   Boniva [Ibandronic Acid] Other (See Comments)    Joint pain    Past Medical History, Surgical history, Social history, and Family History were reviewed and updated.  Review of Systems: Review of Systems  Constitutional: Negative.   HENT: Negative.    Eyes: Negative.   Respiratory: Negative.    Cardiovascular: Negative.   Gastrointestinal: Negative.   Genitourinary: Negative.   Musculoskeletal: Negative.   Skin: Negative.   Neurological: Negative.   Endo/Heme/Allergies: Negative.   Psychiatric/Behavioral: Negative.     Physical Exam:  weight is 153 lb (69.4 kg). Her oral temperature is 98.8 F (37.1 C). Her blood pressure is 172/69 (abnormal) and her pulse is 64. Her respiration is 16 and oxygen saturation is 99%.   Physical Exam Vitals reviewed.  HENT:     Head: Normocephalic and atraumatic.  Eyes:     Pupils: Pupils are equal, round, and reactive to light.  Cardiovascular:     Rate and Rhythm: Normal rate and regular rhythm.     Heart sounds: Normal heart sounds.  Pulmonary:      Effort: Pulmonary effort is normal.     Breath sounds: Normal breath sounds.  Abdominal:     General: Bowel sounds are normal.     Palpations: Abdomen is soft.  Musculoskeletal:        General: No tenderness or deformity. Normal range of motion.     Cervical back: Normal range of motion.  Lymphadenopathy:     Cervical: No cervical adenopathy.  Skin:    General: Skin is warm and dry.     Findings: No erythema or rash.  Neurological:     Mental Status: She is alert and oriented to person, place, and time.  Psychiatric:        Behavior: Behavior normal.        Thought Content: Thought content normal.        Judgment: Judgment normal.     Lab Results  Component Value Date   WBC 8.4 06/03/2021   HGB 11.9 (L) 06/03/2021   HCT 38.1 06/03/2021   MCV 93.8 06/03/2021   PLT 243 06/03/2021     Chemistry      Component Value Date/Time   NA 137 06/03/2021 0945   NA 145 05/10/2017 0748   NA 137 12/06/2016 0746   K 6.4 (HH) 06/03/2021 0945   K 4.7 05/10/2017 0748   K 5.2 (H) 12/06/2016 0746   CL 103 06/03/2021 0945   CL 110 (H) 05/10/2017 0748   CO2 27 06/03/2021 0945   CO2 23 05/10/2017 0748   CO2 23 12/06/2016 0746   BUN 31 (H) 06/03/2021 0945   BUN 28 (H) 05/10/2017 0748   BUN 26.1 (H) 12/06/2016 0746   CREATININE 1.18 (H) 06/03/2021 0945   CREATININE 1.2 05/10/2017 0748   CREATININE 1.2 (H) 12/06/2016 0746      Component Value Date/Time   CALCIUM 9.4 06/03/2021 0945   CALCIUM 9.1 05/10/2017 0748   CALCIUM 9.1 12/06/2016 0746   ALKPHOS 47 06/03/2021 0945   ALKPHOS 61 05/10/2017 0748   ALKPHOS 41 12/06/2016 0746   AST 34 06/03/2021 0945   AST 23 12/06/2016 0746   ALT 33 06/03/2021 0945   ALT 31 05/10/2017 0748   ALT 26 12/06/2016 0746   BILITOT 0.2 (L) 06/03/2021 0945   BILITOT 0.26 12/06/2016 0746       Impression and Plan: Ms. Cavazos is 65 year old white female. She has diabetes. She has iron deficiency anemia. She has a very low erythropoietin  level.  Her blood count looks great.  She does not need any Aranesp.  Again, we did check an EKG on her.  This was normal.  I do not see any evidence of hyperkalemia effects on the heart.  We will plan to get her back in January.     Volanda Napoleon, MD 12/9/202212:32 PM

## 2021-06-03 NOTE — Telephone Encounter (Signed)
Dr. Marin Olp notified of potassium-6.4.  EKG ordered and done. Order received from Dr. Marin Olp to notify Dr. Brigitte Pulse of elevated potassium.  Dr. Raul Del nurse notified of elevated potassium. A repeat K draw requested with pt.'s BP check appt next week at Dr. Raul Del office per order of Dr. Marin Olp. Dr. Raul Del nurse states that she will relay Dr. Antonieta Pert message to Dr. Brigitte Pulse.

## 2021-06-10 ENCOUNTER — Other Ambulatory Visit: Payer: Self-pay

## 2021-06-10 ENCOUNTER — Ambulatory Visit (INDEPENDENT_AMBULATORY_CARE_PROVIDER_SITE_OTHER): Payer: Medicare Other | Admitting: Orthopedic Surgery

## 2021-06-10 DIAGNOSIS — M65342 Trigger finger, left ring finger: Secondary | ICD-10-CM | POA: Diagnosis not present

## 2021-06-10 MED ORDER — BETAMETHASONE SOD PHOS & ACET 6 (3-3) MG/ML IJ SUSP
6.0000 mg | INTRAMUSCULAR | Status: AC | PRN
  Administered 2021-06-10: 6 mg via INTRA_ARTICULAR

## 2021-06-10 MED ORDER — LIDOCAINE HCL 1 % IJ SOLN
1.0000 mL | INTRAMUSCULAR | Status: AC | PRN
Start: 2021-06-10 — End: 2021-06-10
  Administered 2021-06-10: 1 mL

## 2021-06-10 NOTE — Progress Notes (Signed)
Office Visit Note   Patient: Kelly Soto           Date of Birth: 1956/02/24           MRN: [id number] Visit Date: 06/10/2021              Requested by: [name], MD [address] [place],  [place] [address] PCP: [name], MD   Assessment & Plan: Visit Diagnoses:  1. Trigger finger, left ring finger     Plan: We discussed the diagnosis, prognosis, non-operative and operative treatment options for trigger finger.  After our discussion, the patient would like to proceed with corticosteroid injection.  We reviewed the risks and benefits of conservative management.  The patient expressed understanding of the reasoning and strategy going forward.  All patient questions and concerns were addressed.    Follow-Up Instructions: No follow-ups on file.   Orders:  No orders of the defined types were placed in this encounter.  No orders of the defined types were placed in this encounter.     Procedures: Hand/UE Inj: L ring A1 for trigger finger on 06/10/2021 9:33 AM Indications: pain and tendon swelling Details: 25 G needle, volar approach Medications: 1 mL lidocaine 1 %; 6 mg betamethasone acetate-betamethasone sodium phosphate 6 (3-3) MG/ML Outcome: tolerated well, no immediate complications Procedure, treatment alternatives, risks and benefits explained, specific risks discussed. Consent was given by the patient. Patient was prepped and draped in the usual sterile fashion.      Clinical Data: No additional findings.   Subjective: Chief Complaint  Patient presents with   Left Ring Finger - Pain    This is a 65 yo RHD F who presents with left ring finger triggering for two months.  It started as pain over the A1 pulley but has progressed to mild locking with occasional complete locking requiring her to use her other hand to extend the finger.  She has not had any treatment so far.  She underwent A1 pulley release of the right ring finger  years ago.    Review of Systems   Objective: Vital Signs: There were no vitals taken for this visit.  Physical Exam Constitutional:      Appearance: Normal appearance.  Cardiovascular:     Rate and Rhythm: Normal rate.     Pulses: Normal pulses.  Pulmonary:     Effort: Pulmonary effort is normal.  Skin:    General: Skin is warm and dry.     Capillary Refill: Capillary refill takes less than 2 seconds.  Neurological:     Mental Status: She is alert.    Left Hand Exam   Tenderness  Left hand tenderness location: TTP at ring finger A1 pulley.   Other  Erythema: absent Sensation: normal Pulse: present  Comments:  Palpable triggering of ring finger     Specialty Comments:  No specialty comments available.  Imaging: No results found.   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   Chronic renal insufficiency 09/24/2013   Anemia of renal disease 09/24/2013   H/O gastric bypass 08/22/2013   Intestinal malabsorption 08/22/2013   Anemia, iron deficiency 08/22/2013   Chronic eustachian tube dysfunction 03/28/2011   Diabetes mellitus ([place]) 03/28/2011   Past Medical History:  Diagnosis Date   Anemia of renal disease 09/24/2013   Anxiety    Arthritis  Chronic renal insufficiency 09/24/2013   Diabetes mellitus without complication (HCC)    Type II   Gastroparesis    GERD (gastroesophageal reflux disease)    Headache    migrains   History of hiatal hernia    Hypertension    PONV (postoperative nausea and vomiting)    Sleep apnea    Has not used the Cpap since having gastric bypass surgery.   Ulcer     Family History  Problem Relation Age of Onset   Atrial fibrillation Mother    Heart attack Father    Sudden Cardiac Death Father    Stroke Maternal Grandmother     Past Surgical History:  Procedure Laterality Date   ABDOMINAL  HYSTERECTOMY  1992   BREAST BIOPSY Left 03/26/2017   benign   CARDIAC CATHETERIZATION     43yrs ago- negative   CARPAL TUNNEL RELEASE Bilateral    CHOLECYSTECTOMY N/A 05/11/2016   Procedure: LAPAROSCOPIC CHOLECYSTECTOMY;  Surgeon: Erroll Luna, MD;  Location: [facility];  Service: General;  Laterality: N/A;   EYE SURGERY     Bilateral cataracts   GASTRIC BYPASS  2009   PARTIAL KNEE ARTHROPLASTY Right 05/04/2020   Procedure: right partial knee replacement;  Surgeon: Meredith Pel, MD;  Location: [facility];  Service: Orthopedics;  Laterality: Right;   Social History   Occupational History   Not on file  Tobacco Use   Smoking status: Never   Smokeless tobacco: Never   Tobacco comments:    never used tobacco  Vaping Use   Vaping Use: Never used  Substance and Sexual Activity   Alcohol use: No    Alcohol/week: 0.0 standard drinks   Drug use: No   Sexual activity: Yes

## 2021-06-15 ENCOUNTER — Ambulatory Visit: Payer: Medicare Other | Admitting: Hematology & Oncology

## 2021-06-15 ENCOUNTER — Inpatient Hospital Stay: Payer: Medicare Other

## 2021-06-15 ENCOUNTER — Ambulatory Visit: Payer: Medicare Other

## 2021-07-04 ENCOUNTER — Ambulatory Visit: Payer: Medicare Other

## 2021-07-04 ENCOUNTER — Inpatient Hospital Stay: Payer: Medicare Other

## 2021-07-04 ENCOUNTER — Ambulatory Visit: Payer: Medicare Other | Admitting: Hematology & Oncology

## 2021-07-07 ENCOUNTER — Inpatient Hospital Stay: Payer: Medicare Other

## 2021-07-07 ENCOUNTER — Telehealth: Payer: Self-pay | Admitting: *Deleted

## 2021-07-07 ENCOUNTER — Other Ambulatory Visit: Payer: Self-pay

## 2021-07-07 ENCOUNTER — Inpatient Hospital Stay (HOSPITAL_BASED_OUTPATIENT_CLINIC_OR_DEPARTMENT_OTHER): Payer: Medicare Other | Admitting: Hematology & Oncology

## 2021-07-07 ENCOUNTER — Encounter: Payer: Self-pay | Admitting: Hematology & Oncology

## 2021-07-07 ENCOUNTER — Inpatient Hospital Stay: Payer: Medicare Other | Attending: Hematology & Oncology

## 2021-07-07 VITALS — BP 158/72 | HR 69 | Temp 98.3°F | Resp 18 | Wt 150.0 lb

## 2021-07-07 DIAGNOSIS — N1831 Chronic kidney disease, stage 3a: Secondary | ICD-10-CM | POA: Diagnosis not present

## 2021-07-07 DIAGNOSIS — N189 Chronic kidney disease, unspecified: Secondary | ICD-10-CM

## 2021-07-07 DIAGNOSIS — D500 Iron deficiency anemia secondary to blood loss (chronic): Secondary | ICD-10-CM | POA: Diagnosis not present

## 2021-07-07 DIAGNOSIS — N183 Chronic kidney disease, stage 3 unspecified: Secondary | ICD-10-CM | POA: Diagnosis present

## 2021-07-07 DIAGNOSIS — D631 Anemia in chronic kidney disease: Secondary | ICD-10-CM | POA: Diagnosis present

## 2021-07-07 DIAGNOSIS — N182 Chronic kidney disease, stage 2 (mild): Secondary | ICD-10-CM

## 2021-07-07 LAB — CBC WITH DIFFERENTIAL (CANCER CENTER ONLY)
Abs Immature Granulocytes: 0.08 10*3/uL — ABNORMAL HIGH (ref 0.00–0.07)
Basophils Absolute: 0 10*3/uL (ref 0.0–0.1)
Basophils Relative: 1 %
Eosinophils Absolute: 0.1 10*3/uL (ref 0.0–0.5)
Eosinophils Relative: 2 %
HCT: 33.1 % — ABNORMAL LOW (ref 36.0–46.0)
Hemoglobin: 10.2 g/dL — ABNORMAL LOW (ref 12.0–15.0)
Immature Granulocytes: 1 %
Lymphocytes Relative: 21 %
Lymphs Abs: 1.5 10*3/uL (ref 0.7–4.0)
MCH: 28.9 pg (ref 26.0–34.0)
MCHC: 30.8 g/dL (ref 30.0–36.0)
MCV: 93.8 fL (ref 80.0–100.0)
Monocytes Absolute: 0.7 10*3/uL (ref 0.1–1.0)
Monocytes Relative: 9 %
Neutro Abs: 4.8 10*3/uL (ref 1.7–7.7)
Neutrophils Relative %: 66 %
Platelet Count: 226 10*3/uL (ref 150–400)
RBC: 3.53 MIL/uL — ABNORMAL LOW (ref 3.87–5.11)
RDW: 14.6 % (ref 11.5–15.5)
WBC Count: 7.3 10*3/uL (ref 4.0–10.5)
nRBC: 0 % (ref 0.0–0.2)

## 2021-07-07 LAB — CMP (CANCER CENTER ONLY)
ALT: 13 U/L (ref 0–44)
AST: 13 U/L — ABNORMAL LOW (ref 15–41)
Albumin: 4 g/dL (ref 3.5–5.0)
Alkaline Phosphatase: 44 U/L (ref 38–126)
Anion gap: 8 (ref 5–15)
BUN: 34 mg/dL — ABNORMAL HIGH (ref 8–23)
CO2: 27 mmol/L (ref 22–32)
Calcium: 9.8 mg/dL (ref 8.9–10.3)
Chloride: 103 mmol/L (ref 98–111)
Creatinine: 1.21 mg/dL — ABNORMAL HIGH (ref 0.44–1.00)
GFR, Estimated: 49 mL/min — ABNORMAL LOW (ref >60)
Glucose, Bld: 161 mg/dL — ABNORMAL HIGH (ref 70–99)
Potassium: 5.5 mmol/L — ABNORMAL HIGH (ref 3.5–5.1)
Sodium: 138 mmol/L (ref 135–145)
Total Bilirubin: 0.3 mg/dL (ref 0.3–1.2)
Total Protein: 6.4 g/dL — ABNORMAL LOW (ref 6.5–8.1)

## 2021-07-07 LAB — RETICULOCYTES
Immature Retic Fract: 9.8 % (ref 2.3–15.9)
RBC.: 3.48 MIL/uL — ABNORMAL LOW (ref 3.87–5.11)
Retic Count, Absolute: 43.2 10*3/uL (ref 19.0–186.0)
Retic Ct Pct: 1.2 % (ref 0.4–3.1)

## 2021-07-07 LAB — IRON AND IRON BINDING CAPACITY (CC-WL,HP ONLY)
Iron: 82 ug/dL (ref 28–170)
Saturation Ratios: 27 % (ref 10.4–31.8)
TIBC: 300 ug/dL (ref 250–450)
UIBC: 218 ug/dL (ref 148–442)

## 2021-07-07 LAB — FERRITIN: Ferritin: 714 ng/mL — ABNORMAL HIGH (ref 11–307)

## 2021-07-07 MED ORDER — DARBEPOETIN ALFA 300 MCG/0.6ML IJ SOSY
300.0000 ug | PREFILLED_SYRINGE | Freq: Once | INTRAMUSCULAR | Status: AC
  Administered 2021-07-07: 300 ug via SUBCUTANEOUS
  Filled 2021-07-07: qty 0.6

## 2021-07-07 NOTE — Progress Notes (Signed)
Hematology and Oncology Follow Up Visit  Kelly Soto [id number] 06/26/1956 66 y.o. 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 is back for follow-up.  She got through Christmas and New Year's.  It was a little bit tough because both parents had passed away.  They passed away within 2 months of each other.  She and her husband are going to remodel one of their homes.  I know this will be a good project for her.  She has have adjustments with her medications.  She is on carvedilol right now which seems to be helping her blood pressure.  She had iron studies done back in December.  Her ferritin was 688 with an iron saturation of 47%.  She has had no problems with bowels or bladder.  She has had no issues with her blood sugars.  She has a CGM now.  She has had no fever.  There is no cough or shortness of breath.  There is been no issues with neuropathy.  Overall, performance status is ECOG 1.      Medications:  Current Outpatient Medications:    carvedilol (COREG) 12.5 MG tablet, Take 12.5 mg by mouth in the morning and at bedtime., Disp: , Rfl:    metFORMIN (GLUCOPHAGE) 500 MG tablet, Take 1,000 mg by mouth 2 (two) times daily with a meal., Disp: , Rfl:    Ascorbic Acid (VITAMIN C) 1000 MG tablet, Take 1,000 mg by mouth daily., Disp: , Rfl:    baclofen (LIORESAL) 10 MG tablet, 1 tablet with food or milk as needed, Disp: , Rfl:    BD PEN NEEDLE NANO U/F 32G X 4 MM MISC, USE 1 ONCE DAILY WITH TRESIBA, Disp: , Rfl:    betamethasone valerate (VALISONE) 0.1 % cream, Apply 1 application topically daily as needed (irritation)., Disp: , Rfl:    Blood Glucose Monitoring Suppl (CONTOUR NEXT EZ) w/Device KIT, USE  TO CHECK BLOOD SUGARS DAILY, Disp: , Rfl:    chlorthalidone (HYGROTON) 25 MG tablet, Take 25 mg by mouth daily., Disp: , Rfl:    CONTOUR NEXT TEST test strip, USE STRIP TO CHECK GLUCOSE ONCE DAILY, Disp: , Rfl:    denosumab (PROLIA) 60 MG/ML SOLN injection, Inject 60 mg into the skin every 6 (six) months. Administer in upper arm, thigh, or abdomen  Next injection is due 05-2016, Disp: , Rfl:    diazepam (VALIUM) 5 MG tablet, Take 5 mg by mouth daily as needed for anxiety. , Disp: , Rfl: 0   fluconazole (DIFLUCAN) 150 MG tablet, Take 150 mg by mouth as needed., Disp: , Rfl:    fluticasone (FLONASE) 50 MCG/ACT nasal spray, Place 2 sprays into both nostrils at bedtime., Disp: , Rfl:    Galcanezumab-gnlm (EMGALITY) 120 MG/ML SOAJ, Inject 120 mg into the skin every 30 (thirty) days., Disp: , Rfl:    hydroxypropyl methylcellulose / hypromellose (ISOPTO TEARS / GONIOVISC) 2.5 % ophthalmic solution, Place 1 drop into both eyes 3 (three) times daily as needed for dry eyes. , Disp: , Rfl:    Microlet Lancets MISC, USE AS DIRECTED TO CHECK BLOOD SUGAR, Disp: , Rfl:    omeprazole (PRILOSEC) 40 MG capsule, Take 40 mg by mouth 2 (  two) times daily., Disp: , Rfl:    ondansetron (ZOFRAN ODT) 4 MG disintegrating tablet, Take 1 tablet (4 mg total) by mouth every 8 (eight) hours as needed for nausea or vomiting., Disp: 15 tablet, Rfl: 0   pioglitazone (ACTOS) 45 MG tablet, Take 45 mg by mouth daily., Disp: , Rfl:    Simethicone (GAS FREE EXTRA STRENGTH PO), Take 2 tablets by mouth at bedtime., Disp: , Rfl:    sodium chloride (OCEAN) 0.65 % nasal spray, 2 sprays, Disp: , Rfl:    tobramycin (TOBREX) 0.3 % ophthalmic solution, Place 1 drop into both eyes See admin instructions. Instill one drop into the affected eye four times a day prior and the day after eye injection., Disp: , Rfl:    traMADol (ULTRAM) 50 MG tablet, Take 1 tablet (50 mg total) by mouth at bedtime as needed., Disp: 20 tablet, Rfl: 0   TRESIBA FLEXTOUCH  100 UNIT/ML SOPN FlexTouch Pen, Inject 12 Units into the skin daily. , Disp: , Rfl:    triamcinolone (KENALOG) 0.147 MG/GM topical spray, 1 application, Disp: , Rfl:   Allergies:  Allergies  Allergen Reactions   Ace Inhibitors Other (See Comments) and Anaphylaxis    Angioedema   Aspirin Other (See Comments)    Cannot take due to gastric bypass surgery   Nsaids Anaphylaxis and Other (See Comments)    S/p gastric bypass Cannot take orally due to gastric bypass S/p gastric bypass   Sular [Nisoldipine Er] Other (See Comments)    angioedema   Nisoldipine Swelling and Other (See Comments)   Other Other (See Comments)   Boniva [Ibandronic Acid] Other (See Comments)    Joint pain    Past Medical History, Surgical history, Social history, and Family History were reviewed and updated.  Review of Systems: Review of Systems  Constitutional: Negative.   HENT: Negative.    Eyes: Negative.   Respiratory: Negative.    Cardiovascular: Negative.   Gastrointestinal: Negative.   Genitourinary: Negative.   Musculoskeletal: Negative.   Skin: Negative.   Neurological: Negative.   Endo/Heme/Allergies: Negative.   Psychiatric/Behavioral: Negative.     Physical Exam:  weight is 150 lb (68 kg). Her oral temperature is 98.3 F (36.8 C). Her blood pressure is 158/72 (abnormal) and her pulse is 69. Her respiration is 18 and oxygen saturation is 100%.   Physical Exam Vitals reviewed.  HENT:     Head: Normocephalic and atraumatic.  Eyes:     Pupils: Pupils are equal, round, and reactive to light.  Cardiovascular:     Rate and Rhythm: Normal rate and regular rhythm.     Heart sounds: Normal heart sounds.  Pulmonary:     Effort: Pulmonary effort is normal.     Breath sounds: Normal breath sounds.  Abdominal:     General: Bowel sounds are normal.     Palpations: Abdomen is soft.  Musculoskeletal:        General: No tenderness or deformity. Normal range of motion.     Cervical back: Normal  range of motion.  Lymphadenopathy:     Cervical: No cervical adenopathy.  Skin:    General: Skin is warm and dry.     Findings: No erythema or rash.  Neurological:     Mental Status: She is alert and oriented to person, place, and time.  Psychiatric:        Behavior: Behavior normal.        Thought Content: Thought content normal.  Judgment: Judgment normal.     Lab Results  Component Value Date   WBC 7.3 07/07/2021   HGB 10.2 (L) 07/07/2021   HCT 33.1 (L) 07/07/2021   MCV 93.8 07/07/2021   PLT 226 07/07/2021     Chemistry      Component Value Date/Time   NA 138 07/07/2021 0814   NA 145 05/10/2017 0748   NA 137 12/06/2016 0746   K 5.5 (H) 07/07/2021 0814   K 4.7 05/10/2017 0748   K 5.2 (H) 12/06/2016 0746   CL 103 07/07/2021 0814   CL 110 (H) 05/10/2017 0748   CO2 27 07/07/2021 0814   CO2 23 05/10/2017 0748   CO2 23 12/06/2016 0746   BUN 34 (H) 07/07/2021 0814   BUN 28 (H) 05/10/2017 0748   BUN 26.1 (H) 12/06/2016 0746   CREATININE 1.21 (H) 07/07/2021 0814   CREATININE 1.2 05/10/2017 0748   CREATININE 1.2 (H) 12/06/2016 0746      Component Value Date/Time   CALCIUM 9.8 07/07/2021 0814   CALCIUM 9.1 05/10/2017 0748   CALCIUM 9.1 12/06/2016 0746   ALKPHOS 44 07/07/2021 0814   ALKPHOS 61 05/10/2017 0748   ALKPHOS 41 12/06/2016 0746   AST 13 (L) 07/07/2021 0814   AST 23 12/06/2016 0746   ALT 13 07/07/2021 0814   ALT 31 05/10/2017 0748   ALT 26 12/06/2016 0746   BILITOT 0.3 07/07/2021 0814   BILITOT 0.26 12/06/2016 0746       Impression and Plan: Kelly Soto is 66 year old white female. She has diabetes. She has iron deficiency anemia. She has a very low erythropoietin level.  We will go ahead and give her Aranesp today.  Thankfully, her blood sugar is 161.  Her potassium is 5.5.  We will plan to get her back in about 2 months now.  I think we can get her through most of Winter.   Volanda Napoleon, MD 1/12/20239:10 AM

## 2021-07-07 NOTE — Telephone Encounter (Signed)
Per 07/05/21 los - gave upcoming appointments - confirmed

## 2021-07-07 NOTE — Patient Instructions (Signed)
Darbepoetin Alfa injection ?What is this medication? ?DARBEPOETIN ALFA (dar be POE e tin  AL fa) helps your body make more red blood cells. It is used to treat anemia caused by chronic kidney failure and chemotherapy. ?This medicine may be used for other purposes; ask your health care provider or pharmacist if you have questions. ?COMMON BRAND NAME(S): Aranesp ?What should I tell my care team before I take this medication? ?They need to know if you have any of these conditions: ?blood clotting disorders or history of blood clots ?cancer patient not on chemotherapy ?cystic fibrosis ?heart disease, such as angina, heart failure, or a history of a heart attack ?hemoglobin level of 12 g/dL or greater ?high blood pressure ?low levels of folate, iron, or vitamin B12 ?seizures ?an unusual or allergic reaction to darbepoetin, erythropoietin, albumin, hamster proteins, latex, other medicines, foods, dyes, or preservatives ?pregnant or trying to get pregnant ?breast-feeding ?How should I use this medication? ?This medicine is for injection into a vein or under the skin. It is usually given by a health care professional in a hospital or clinic setting. ?If you get this medicine at home, you will be taught how to prepare and give this medicine. Use exactly as directed. Take your medicine at regular intervals. Do not take your medicine more often than directed. ?It is important that you put your used needles and syringes in a special sharps container. Do not put them in a trash can. If you do not have a sharps container, call your pharmacist or healthcare provider to get one. ?A special MedGuide will be given to you by the pharmacist with each prescription and refill. Be sure to read this information carefully each time. ?Talk to your pediatrician regarding the use of this medicine in children. While this medicine may be used in children as young as 1 month of age for selected conditions, precautions do apply. ?Overdosage: If  you think you have taken too much of this medicine contact a poison control center or emergency room at once. ?NOTE: This medicine is only for you. Do not share this medicine with others. ?What if I miss a dose? ?If you miss a dose, take it as soon as you can. If it is almost time for your next dose, take only that dose. Do not take double or extra doses. ?What may interact with this medication? ?Do not take this medicine with any of the following medications: ?epoetin alfa ?This list may not describe all possible interactions. Give your health care provider a list of all the medicines, herbs, non-prescription drugs, or dietary supplements you use. Also tell them if you smoke, drink alcohol, or use illegal drugs. Some items may interact with your medicine. ?What should I watch for while using this medication? ?Your condition will be monitored carefully while you are receiving this medicine. ?You may need blood work done while you are taking this medicine. ?This medicine may cause a decrease in vitamin B6. You should make sure that you get enough vitamin B6 while you are taking this medicine. Discuss the foods you eat and the vitamins you take with your health care professional. ?What side effects may I notice from receiving this medication? ?Side effects that you should report to your doctor or health care professional as soon as possible: ?allergic reactions like skin rash, itching or hives, swelling of the face, lips, or tongue ?breathing problems ?changes in vision ?chest pain ?confusion, trouble speaking or understanding ?feeling faint or lightheaded, falls ?high blood   pressure ?muscle aches or pains ?pain, swelling, warmth in the leg ?rapid weight gain ?severe headaches ?sudden numbness or weakness of the face, arm or leg ?trouble walking, dizziness, loss of balance or coordination ?seizures (convulsions) ?swelling of the ankles, feet, hands ?unusually weak or tired ?Side effects that usually do not require  medical attention (report to your doctor or health care professional if they continue or are bothersome): ?diarrhea ?fever, chills (flu-like symptoms) ?headaches ?nausea, vomiting ?redness, stinging, or swelling at site where injected ?This list may not describe all possible side effects. Call your doctor for medical advice about side effects. You may report side effects to FDA at 1-800-FDA-1088. ?Where should I keep my medication? ?Keep out of the reach of children. ?Store in a refrigerator between 2 and 8 degrees C (36 and 46 degrees F). Do not freeze. Do not shake. Throw away any unused portion if using a single-dose vial. Throw away any unused medicine after the expiration date. ?NOTE: This sheet is a summary. It may not cover all possible information. If you have questions about this medicine, talk to your doctor, pharmacist, or health care provider. ?? 2022 Elsevier/Gold Standard (2017-07-02 00:00:00) ? ?

## 2021-08-25 ENCOUNTER — Inpatient Hospital Stay: Payer: Medicare Other | Attending: Hematology & Oncology

## 2021-08-25 ENCOUNTER — Inpatient Hospital Stay: Payer: Medicare Other

## 2021-08-25 ENCOUNTER — Inpatient Hospital Stay (HOSPITAL_BASED_OUTPATIENT_CLINIC_OR_DEPARTMENT_OTHER): Payer: Medicare Other | Admitting: Hematology & Oncology

## 2021-08-25 ENCOUNTER — Encounter: Payer: Self-pay | Admitting: Hematology & Oncology

## 2021-08-25 ENCOUNTER — Other Ambulatory Visit: Payer: Self-pay

## 2021-08-25 ENCOUNTER — Telehealth: Payer: Self-pay | Admitting: *Deleted

## 2021-08-25 VITALS — BP 165/82 | HR 69 | Temp 98.2°F | Resp 16 | Wt 149.0 lb

## 2021-08-25 DIAGNOSIS — D509 Iron deficiency anemia, unspecified: Secondary | ICD-10-CM | POA: Diagnosis not present

## 2021-08-25 DIAGNOSIS — D500 Iron deficiency anemia secondary to blood loss (chronic): Secondary | ICD-10-CM

## 2021-08-25 DIAGNOSIS — N1831 Chronic kidney disease, stage 3a: Secondary | ICD-10-CM

## 2021-08-25 DIAGNOSIS — N289 Disorder of kidney and ureter, unspecified: Secondary | ICD-10-CM | POA: Diagnosis not present

## 2021-08-25 DIAGNOSIS — N2589 Other disorders resulting from impaired renal tubular function: Secondary | ICD-10-CM | POA: Insufficient documentation

## 2021-08-25 DIAGNOSIS — E119 Type 2 diabetes mellitus without complications: Secondary | ICD-10-CM | POA: Insufficient documentation

## 2021-08-25 DIAGNOSIS — D631 Anemia in chronic kidney disease: Secondary | ICD-10-CM

## 2021-08-25 DIAGNOSIS — N189 Chronic kidney disease, unspecified: Secondary | ICD-10-CM

## 2021-08-25 DIAGNOSIS — Z794 Long term (current) use of insulin: Secondary | ICD-10-CM | POA: Insufficient documentation

## 2021-08-25 DIAGNOSIS — N1832 Chronic kidney disease, stage 3b: Secondary | ICD-10-CM | POA: Diagnosis not present

## 2021-08-25 LAB — CBC WITH DIFFERENTIAL (CANCER CENTER ONLY)
Abs Immature Granulocytes: 0.02 10*3/uL (ref 0.00–0.07)
Basophils Absolute: 0 10*3/uL (ref 0.0–0.1)
Basophils Relative: 1 %
Eosinophils Absolute: 0.1 10*3/uL (ref 0.0–0.5)
Eosinophils Relative: 2 %
HCT: 37.5 % (ref 36.0–46.0)
Hemoglobin: 11.7 g/dL — ABNORMAL LOW (ref 12.0–15.0)
Immature Granulocytes: 0 %
Lymphocytes Relative: 19 %
Lymphs Abs: 1.5 10*3/uL (ref 0.7–4.0)
MCH: 28.9 pg (ref 26.0–34.0)
MCHC: 31.2 g/dL (ref 30.0–36.0)
MCV: 92.6 fL (ref 80.0–100.0)
Monocytes Absolute: 0.6 10*3/uL (ref 0.1–1.0)
Monocytes Relative: 8 %
Neutro Abs: 5.5 10*3/uL (ref 1.7–7.7)
Neutrophils Relative %: 70 %
Platelet Count: 205 10*3/uL (ref 150–400)
RBC: 4.05 MIL/uL (ref 3.87–5.11)
RDW: 14.8 % (ref 11.5–15.5)
WBC Count: 7.7 10*3/uL (ref 4.0–10.5)
nRBC: 0 % (ref 0.0–0.2)

## 2021-08-25 LAB — CMP (CANCER CENTER ONLY)
ALT: 15 U/L (ref 0–44)
AST: 17 U/L (ref 15–41)
Albumin: 3.8 g/dL (ref 3.5–5.0)
Alkaline Phosphatase: 52 U/L (ref 38–126)
Anion gap: 9 (ref 5–15)
BUN: 34 mg/dL — ABNORMAL HIGH (ref 8–23)
CO2: 28 mmol/L (ref 22–32)
Calcium: 9.4 mg/dL (ref 8.9–10.3)
Chloride: 105 mmol/L (ref 98–111)
Creatinine: 1.16 mg/dL — ABNORMAL HIGH (ref 0.44–1.00)
GFR, Estimated: 52 mL/min — ABNORMAL LOW (ref >60)
Glucose, Bld: 141 mg/dL — ABNORMAL HIGH (ref 70–99)
Potassium: 5.7 mmol/L — ABNORMAL HIGH (ref 3.5–5.1)
Sodium: 142 mmol/L (ref 135–145)
Total Bilirubin: 0.3 mg/dL (ref 0.3–1.2)
Total Protein: 6.6 g/dL (ref 6.5–8.1)

## 2021-08-25 LAB — RETICULOCYTES
Immature Retic Fract: 4 % (ref 2.3–15.9)
RBC.: 4.07 MIL/uL (ref 3.87–5.11)
Retic Count, Absolute: 25.2 10*3/uL (ref 19.0–186.0)
Retic Ct Pct: 0.6 % (ref 0.4–3.1)

## 2021-08-25 LAB — IRON AND IRON BINDING CAPACITY (CC-WL,HP ONLY)
Iron: 130 ug/dL (ref 28–170)
Saturation Ratios: 44 % — ABNORMAL HIGH (ref 10.4–31.8)
TIBC: 297 ug/dL (ref 250–450)
UIBC: 167 ug/dL (ref 148–442)

## 2021-08-25 LAB — FERRITIN: Ferritin: 611 ng/mL — ABNORMAL HIGH (ref 11–307)

## 2021-08-25 NOTE — Telephone Encounter (Signed)
Per 08/25/21 los - gave upcoming appointments - confirmed ?

## 2021-08-25 NOTE — Progress Notes (Signed)
Hematology and Oncology Follow Up Visit ? ?Kelly Soto ?[id number] ?[birth year] 66 y.o. ?08/25/2021 ? ? 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 ?Current Therapy:   ?IV iron as indicated -- Feraheme on 05/2020 ?Aranesp 300 mcg subcutaneous as needed for hemoglobin less than 11 - ?    ?Interim History:  [name] is back for follow-up.  So far, she is doing quite well.  She has had no problems since we last saw her in January.  She had her birthday a couple weeks ago.  She can actually celebrated today. ? ?She is watching her blood sugars.  Today, her blood sugar was 141. ? ?When we last saw her, her ferritin was 714 with an iron saturation of 27%. ? ?She has had no bleeding.  There is no fever.  She has had no cough or shortness of breath.  There is no change in bowel or bladder habits.  She has had no rashes.  There is been no leg swelling. ? ?She and her husband are still doing the remodeling of the home.  She enjoys this. ? ?Overall, I would say her performance status is ECOG 1.   ?  ? ? Medications:  ?Current Outpatient Medications:  ?  Ascorbic Acid (VITAMIN C) 1000 MG tablet, Take 1,000 mg by mouth daily., Disp: , Rfl:  ?  baclofen (LIORESAL) 10 MG tablet, 1 tablet with food or milk as needed, Disp: , Rfl:  ?  BD PEN NEEDLE NANO U/F 32G X 4 MM MISC, USE 1 ONCE DAILY WITH TRESIBA, Disp: , Rfl:  ?  betamethasone valerate (VALISONE) 0.1 % cream, Apply 1 application topically daily as needed (irritation)., Disp: , Rfl:  ?  Blood Glucose Monitoring Suppl (CONTOUR NEXT EZ) w/Device KIT, USE TO CHECK BLOOD SUGARS DAILY, Disp: , Rfl:  ?  carvedilol (COREG) 12.5 MG tablet, Take 12.5 mg by mouth in the morning and at bedtime., Disp: , Rfl:  ?  chlorthalidone (HYGROTON) 25 MG tablet, Take 25 mg by mouth daily., Disp: , Rfl:  ?  CONTOUR NEXT TEST test strip, USE STRIP TO CHECK GLUCOSE ONCE DAILY, Disp: , Rfl:   ?  denosumab (PROLIA) 60 MG/ML SOLN injection, Inject 60 mg into the skin every 6 (six) months. Administer in upper arm, thigh, or abdomen  Next injection is due 05-2016, Disp: , Rfl:  ?  diazepam (VALIUM) 5 MG tablet, Take 5 mg by mouth daily as needed for anxiety. , Disp: , Rfl: 0 ?  dorzolamide-timolol (COSOPT) 22.3-6.8 MG/ML ophthalmic solution, , Disp: , Rfl:  ?  fluconazole (DIFLUCAN) 150 MG tablet, Take 150 mg by mouth as needed., Disp: , Rfl:  ?  fluticasone (FLONASE) 50 MCG/ACT nasal spray, Place 2 sprays into both nostrils at bedtime., Disp: , Rfl:  ?  Galcanezumab-gnlm (EMGALITY) 120 MG/ML SOAJ, Inject 120 mg into the skin every 30 (thirty) days., Disp: , Rfl:  ?  hydroxypropyl methylcellulose / hypromellose (ISOPTO TEARS / GONIOVISC) 2.5 % ophthalmic solution, Place 1 drop into both eyes 3 (three) times daily as needed for dry eyes. , Disp: , Rfl:  ?  metFORMIN (GLUCOPHAGE) 500 MG tablet, Take 1,000 mg by mouth 2 (two) times daily with a meal., Disp: , Rfl:  ?  Microlet Lancets MISC, USE AS DIRECTED TO CHECK BLOOD SUGAR, Disp: , Rfl:  ?  omeprazole (PRILOSEC) 40 MG capsule, Take 40 mg by mouth 2 (two) times  daily., Disp: , Rfl:  ?  ondansetron (ZOFRAN ODT) 4 MG disintegrating tablet, Take 1 tablet (4 mg total) by mouth every 8 (eight) hours as needed for nausea or vomiting., Disp: 15 tablet, Rfl: 0 ?  pioglitazone (ACTOS) 45 MG tablet, Take 45 mg by mouth daily., Disp: , Rfl:  ?  Simethicone (GAS FREE EXTRA STRENGTH PO), Take 2 tablets by mouth at bedtime., Disp: , Rfl:  ?  sodium chloride (OCEAN) 0.65 % nasal spray, 2 sprays, Disp: , Rfl:  ?  tobramycin (TOBREX) 0.3 % ophthalmic solution, Place 1 drop into both eyes See admin instructions. Instill one drop into the affected eye four times a Soto prior and the Soto after eye injection., Disp: , Rfl:  ?  TRESIBA FLEXTOUCH 100 UNIT/ML SOPN FlexTouch Pen, Inject 12 Units into the skin daily. , Disp: , Rfl:  ?  triamcinolone (KENALOG) 0.147 MG/GM topical  spray, 1 application, Disp: , Rfl:  ? ?Allergies:  ?Allergies  ?Allergen Reactions  ? Ace Inhibitors Other (See Comments) and Anaphylaxis  ?  Angioedema  ? Aspirin Other (See Comments)  ?  Cannot take due to gastric bypass surgery  ? Nsaids Anaphylaxis and Other (See Comments)  ?  S/p gastric bypass ?Cannot take orally due to gastric bypass ?S/p gastric bypass  ? Sular [Nisoldipine Er] Other (See Comments)  ?  angioedema  ? Nisoldipine Swelling and Other (See Comments)  ? Other Other (See Comments)  ? Boniva [Ibandronic Acid] Other (See Comments)  ?  Joint pain  ? ? ?Past Medical History, Surgical history, Social history, and Family History were reviewed and updated. ? ?Review of Systems: ?Review of Systems  ?Constitutional: Negative.   ?HENT: Negative.    ?Eyes: Negative.   ?Respiratory: Negative.    ?Cardiovascular: Negative.   ?Gastrointestinal: Negative.   ?Genitourinary: Negative.   ?Musculoskeletal: Negative.   ?Skin: Negative.   ?Neurological: Negative.   ?Endo/Heme/Allergies: Negative.   ?Psychiatric/Behavioral: Negative.    ? ?Physical Exam: ? weight is 149 lb (67.6 kg). Her oral temperature is 98.2 ?F (36.8 ?C). Her blood pressure is 165/82 (abnormal) and her pulse is 69. Her respiration is 16 and oxygen saturation is 98%.  ? ?Physical Exam ?Vitals reviewed.  ?HENT:  ?   Head: Normocephalic and atraumatic.  ?Eyes:  ?   Pupils: Pupils are equal, round, and reactive to light.  ?Cardiovascular:  ?   Rate and Rhythm: Normal rate and regular rhythm.  ?   Heart sounds: Normal heart sounds.  ?Pulmonary:  ?   Effort: Pulmonary effort is normal.  ?   Breath sounds: Normal breath sounds.  ?Abdominal:  ?   General: Bowel sounds are normal.  ?   Palpations: Abdomen is soft.  ?Musculoskeletal:     ?   General: No tenderness or deformity. Normal range of motion.  ?   Cervical back: Normal range of motion.  ?Lymphadenopathy:  ?   Cervical: No cervical adenopathy.  ?Skin: ?   General: Skin is warm and dry.  ?   Findings:  No erythema or rash.  ?Neurological:  ?   Mental Status: She is alert and oriented to person, place, and time.  ?Psychiatric:     ?   Behavior: Behavior normal.     ?   Thought Content: Thought content normal.     ?   Judgment: Judgment normal.  ? ? ? ?Lab Results  ?Component Value Date  ? WBC 7.7 08/25/2021  ? HGB 11.7 (L)  08/25/2021  ? HCT 37.5 08/25/2021  ? MCV 92.6 08/25/2021  ? PLT 205 08/25/2021  ? ?  Chemistry   ?   ?Component Value Date/Time  ? NA 138 07/07/2021 0814  ? NA 145 05/10/2017 0748  ? NA 137 12/06/2016 0746  ? K 5.5 (H) 07/07/2021 0301  ? K 4.7 05/10/2017 0748  ? K 5.2 (H) 12/06/2016 0746  ? CL 103 07/07/2021 0814  ? CL 110 (H) 05/10/2017 0748  ? CO2 27 07/07/2021 0814  ? CO2 23 05/10/2017 0748  ? CO2 23 12/06/2016 0746  ? BUN 34 (H) 07/07/2021 4996  ? BUN 28 (H) 05/10/2017 0748  ? BUN 26.1 (H) 12/06/2016 0746  ? CREATININE 1.21 (H) 07/07/2021 9249  ? CREATININE 1.2 05/10/2017 0748  ? CREATININE 1.2 (H) 12/06/2016 0746  ?    ?Component Value Date/Time  ? CALCIUM 9.8 07/07/2021 0814  ? CALCIUM 9.1 05/10/2017 0748  ? CALCIUM 9.1 12/06/2016 0746  ? ALKPHOS 44 07/07/2021 0814  ? ALKPHOS 61 05/10/2017 0748  ? ALKPHOS 41 12/06/2016 0746  ? AST 13 (L) 07/07/2021 3241  ? AST 23 12/06/2016 0746  ? ALT 13 07/07/2021 0814  ? ALT 31 05/10/2017 0748  ? ALT 26 12/06/2016 0746  ? BILITOT 0.3 07/07/2021 0814  ? BILITOT 0.26 12/06/2016 0746  ?  ? ? ? ?Impression and Plan: ?Kelly Soto is 66 year old white female. She has diabetes. She has iron deficiency anemia. She has a very low erythropoietin level. ? ?She will not need any Aranesp today.  I am sure her iron studies should be okay.  Para she was had an elevated potassium.  She has renal tubular acidosis.  She has mild renal insufficiency. ? ?I think we can now plan to get her back right before Easter.  This would be a good time frame for follow-up. ? ? ?Kelly Napoleon, MD ?3/2/20238:25 AM ? ?

## 2021-09-07 NOTE — Patient Outreach (Signed)
Kelly Soto [facility]) Care Management ? ?09/07/2021 ? ?Kelly Soto ?[birth year] ?[id number] ? ? ?Received referral for Care Management from Insurance plan. Assigned patient to Kelly Mina, RN Care Coordinator for follow. ? ?Philmore Pali ?[place] Management Assistant ?[phone] ? ?

## 2021-09-16 ENCOUNTER — Other Ambulatory Visit: Payer: Self-pay | Admitting: *Deleted

## 2021-09-16 NOTE — Patient Instructions (Addendum)
Visit Information ? ?Thank you for taking time to visit with me today. Please don't hesitate to contact me if I can be of assistance to you before our next scheduled telephone appointment. ? ?For assistance for the nutritional Glucerna Supplement drinks ?Dial a Dietician that number is DIAL A DIETITIAN ([place]): [phone] 8:30AM-5PM MONDAY-FRIDAY EST. ?There is also an [employer] Consumer Hotline: call [phone]. ? ? ?Following are the goals we discussed today:  ? ?Take all medications as prescribed ?Attend all scheduled provider appointments ?Call pharmacy for medication refills 3-7 days in advance of running out of medications ?Attend church or other social activities ?Perform all self care activities independently  ?Perform IADL's (shopping, preparing meals, housekeeping, managing finances) independently ?Call provider office for new concerns or questions  ?schedule appointment with eye doctor ?check blood sugar at prescribed times: three times daily, 4 times daily, and when you have symptoms of low or high blood sugar ?check feet daily for cuts, sores or redness ?enter blood sugar readings and medication or insulin into daily log ?take the blood sugar log to all doctor visits ?set goal weight ?trim toenails straight across ?drink 6 to 8 glasses of water each day ?eat fish at least once per week ?fill half of plate with vegetables ?keep a food diary ?limit fast food meals to no more than 1 per week ?manage portion size ?set a realistic goal ?switch to low-fat or skim milk ?switch to sugar-free drinks ?keep feet up while sitting ?wear comfortable, cotton socks ?wear comfortable, well-fitting shoes ? ?  ?

## 2021-09-16 NOTE — Patient Outreach (Addendum)
?[facility]) Care Management ?Telephonic RN Care Manager Note ? ? ?09/16/2021 ?Name:  Kelly Soto MRN:  [id number] DOB:  06/09/1956 ? ?Summary: ?Discussed ranges for CBG 54-300 and last A1C 7 from her provider's office last month. Pt with history of a gastric bypass and continue to manage her dialysis with insulin and dietary changes. Enrolled into the diabetes program for case management services and made a referral to [facility] concerning medication assistance. Will follow up next week to completed the initial assessment. Will also send contact for agency called [employer] for nutritional supplement for Glucerna as pt was consume Ensure for supplement drinks. Hotline also has access to a dietitian.  ? ?Recommendations/Changes made from today's visit: ?Encouraged pt to consume high protein and low carbohydrates for her ongoing management of care related to her diabetes.  Stress the importance of small increments meals throughout the day to stabilize her glucose levels. ? ?Subjective: ?Kelly Soto is an 66 y.o. year old female who is a primary patient of [name], MD. The care management team was consulted for assistance with care management and/or care coordination needs.   ? ?Telephonic RN Care Manager completed Telephone Visit today. ? ?Objective:  ? ?Medications Reviewed Today   ? ? Reviewed by Melton Krebs, RN (Registered Nurse) on 08/25/21 at [place] List Status: <None>  ? ?Medication Order Taking? Sig Documenting Provider Last Dose Status Informant  ?Ascorbic Acid (VITAMIN C) 1000 MG tablet [id number]  Take 1,000 mg by mouth daily. [provider]  Active Self  ?baclofen (LIORESAL) 10 MG tablet [id number]  1 tablet with food or milk as needed [provider]  Active   ?BD PEN NEEDLE NANO U/F 32G X 4 MM MISC [id number]  USE 1 ONCE DAILY WITH TRESIBA [provider]  Active Self  ?betamethasone valerate (VALISONE) 0.1 % cream [id number]  Apply 1  application topically daily as needed (irritation). [provider]  Active Self  ?Blood Glucose Monitoring Suppl (CONTOUR NEXT EZ) w/Device KIT [id number]  USE TO CHECK BLOOD SUGARS DAILY [provider]  Active Self  ?carvedilol (COREG) 12.5 MG tablet [id number]  Take 12.5 mg by mouth in the morning and at bedtime. [provider]  Active   ?chlorthalidone (HYGROTON) 25 MG tablet [id number]  Take 25 mg by mouth daily. [provider]  Active   ?CONTOUR NEXT TEST test strip [id number]  USE STRIP TO CHECK GLUCOSE ONCE DAILY [provider]  Active Self  ?denosumab (PROLIA) 60 MG/ML SOLN injection [id number]  Inject 60 mg into the skin every 6 (six) months. Administer in upper arm, thigh, or abdomen  ?Next injection is due 05-2016 [provider]  Active Self  ?         ?Med Note [name], [name]   Thu Jun 29, 2016 10:56 AM)    ?diazepam (VALIUM) 5 MG tablet [id number]  Take 5 mg by mouth daily as needed for anxiety.  [provider]  Active Self  ?         ?Med Note Delorise Shiner   Thu May 04, 2016  9:06 AM)    ?dorzolamide-timolol (COSOPT) 22.3-6.8 MG/ML ophthalmic solution [id number]   [provider]  Active   ?fluconazole (DIFLUCAN) 150 MG tablet [id number]  Take 150 mg by mouth as needed. [provider]  Active   ?fluticasone (FLONASE) 50 MCG/ACT nasal spray [id number]  Place 2 sprays into both nostrils at bedtime. [provider]  Active Self  ?Galcanezumab-gnlm (EMGALITY) 120 MG/ML SOAJ [id number]  Inject 120 mg into the skin every 30 (thirty) days. [provider]  Active Self  ?hydroxypropyl methylcellulose / hypromellose (ISOPTO TEARS / GONIOVISC) 2.5 % ophthalmic solution [id number]  Place 1 drop into both eyes 3 (three) times daily as needed for dry eyes.  [provider]  Active Self  ?metFORMIN (GLUCOPHAGE) 500 MG tablet [id number]  Take 1,000 mg by mouth 2 (two) times daily with a meal. [provider]  Active   ?Microlet Lancets MISC [id number]  USE AS DIRECTED TO CHECK BLOOD SUGAR [provider]  Active Self  ?omeprazole (PRILOSEC) 40 MG capsule [id number]  Take 40 mg by mouth 2 (two) times daily. [provider]  Active Self  ?ondansetron (ZOFRAN ODT) 4 MG disintegrating tablet [id number]  Take 1 tablet (4 mg total) by mouth every 8 (eight) hours as needed for nausea or vomiting. Caccavale, Sophia, PA-C  Active   ?pioglitazone (ACTOS) 45 MG tablet [id number]  Take 45 mg by mouth daily. [provider]  Active Self  ?Simethicone (GAS FREE EXTRA STRENGTH PO) [id number]  Take 2 tablets by mouth at bedtime. [provider]  Active Self  ?sodium chloride (OCEAN) 0.65 % nasal spray [id number]  2 sprays [provider]  Active   ?tobramycin (TOBREX) 0.3 % ophthalmic solution [id number]  Place 1 drop into both eyes See admin instructions. Instill one drop into the affected eye four times a day prior and the day after eye injection. [provider]  Active Self  ?         ?Med Note Nat Christen   Wed Apr 21, 2020  2:24 PM) Injections are for both eyes but may not always be given at the same time.  ?TRESIBA FLEXTOUCH 100 UNIT/ML SOPN FlexTouch Pen [id number]  Inject 12 Units into the skin daily.  [provider]  Active Self  ?triamcinolone (KENALOG) 0.147 MG/GM topical spray [id number]  1 application [provider]  Active   ? ?  ?  ? ?  ? ? ? ?SDOH:  (Social Determinants of Health) assessments and interventions performed:  ? ? ? ?Care Plan ? ?Review of patient past medical history, allergies, medications, health status, including review of consultants reports, laboratory and other test data, was performed as part of comprehensive evaluation for care management services.  ? ?Care Plan : RN Care Manager plan of care  ?Updates made by Tobi Bastos, RN since 09/16/2021 12:00 AM  ?  ? ?Problem: Knowledge deficit related to Diabetes    ?Priority: High  ?  ? ?Long-Range Goal: Development plan of care for management of Diabetes   ?Start Date: 09/16/2021  ?Expected End Date: 03/24/2022  ?Priority: High  ?Note:   ?Current Barriers:  ?Knowledge Deficits related to plan of care for management of DMII  ? ?RNCM Clinical Goal(s):  ?Patient will verbalize understanding of plan for management of DMII as evidenced by self report and chart review ?verbalize basic understanding of  DMII disease process and self health management plan as evidenced by self reporting ?take all medications exactly as prescribed and will call provider for medication related questions as evidenced by self reporting and chart review  through collaboration with RN Care manager, provider, and care team.  ? ?Interventions: ?Inter-disciplinary care team collaboration (see longitudinal plan of care) ?Evaluation of current treatment plan related to  self management and patient's adherence to plan as established by provider ? ? ?Diabetes  Interventions:  (Status:  New goal.) Long Term Goal ?Assessed patient's understanding of A1c goal: <6.5% ?Provided education to patient about basic DM disease process ?Counseled on importance of regular laboratory monitoring as prescribed ?Discussed plans with patient for ongoing care management follow up and provided patient with direct contact information for care management team ?Provided patient with written educational materials related to hypo and hyperglycemia and importance of correct treatment ?Reviewed scheduled/upcoming provider appointments including: self reporting for outside the Epic notification ?Advised patient, providing education and rationale, to check cbg 3-4 times daily and record, calling with any related symptoms for findings outside established parameters ?Referral made to pharmacy team for assistance with several costly medications for medication assistance ?Screening for signs and symptoms of depression related to chronic disease  state  ?Lab Results  ?Component Value Date  ? HGBA1C 7.1 (H) 04/27/2020  ? ?Patient Goals/Self-Care Activities: ?Take all medications as prescribed ?Attend all scheduled provider appointments ?Call pharmacy for medic

## 2021-09-16 NOTE — Addendum Note (Signed)
Addended by: Raina Mina D on: 09/16/2021 02:37 PM ? ? Modules accepted: Orders ? ?

## 2021-09-19 NOTE — Patient Outreach (Signed)
[facility]) Care Management ? ?09/19/2021 ? ?Brooke Dare ?Nov 11, 1955 ?[id number] ? ?Referral for medication assistance from Raina Mina, RN sent to [place].  ? ?Ina Homes ?[place] Management Assistant ?[phone] ?

## 2021-09-20 ENCOUNTER — Ambulatory Visit: Payer: Self-pay | Admitting: *Deleted

## 2021-09-21 ENCOUNTER — Other Ambulatory Visit: Payer: Self-pay | Admitting: *Deleted

## 2021-09-21 ENCOUNTER — Encounter: Payer: Self-pay | Admitting: *Deleted

## 2021-09-21 NOTE — Patient Outreach (Signed)
[facility]) Care Management ? ?09/21/2021 ? ?Kelly Soto ?27-Apr-1956 ?[id number] ? ? ?Telephone Assessment ?Outreach call today to complete the initial assessment that was started last week. ? ?Pt remains receptive to the discussed plan of care and has consulted with the referral [facility] pharmacy personnel. No acute issues to address today and pt continue to adhere to the discussed plan of care.  ? ?Will follow up next month with  pt concerning her ongoing management of care with all discussed in managing her care. ? ?Raina Mina, RN ?Care Management Coordinator ?[place] ?Main Office [phone]  ?

## 2021-09-22 ENCOUNTER — Ambulatory Visit: Payer: Medicare Other | Admitting: Orthopedic Surgery

## 2021-09-23 ENCOUNTER — Ambulatory Visit (INDEPENDENT_AMBULATORY_CARE_PROVIDER_SITE_OTHER): Payer: Medicare Other | Admitting: Orthopedic Surgery

## 2021-09-23 ENCOUNTER — Encounter: Payer: Self-pay | Admitting: Orthopedic Surgery

## 2021-09-23 DIAGNOSIS — M65342 Trigger finger, left ring finger: Secondary | ICD-10-CM | POA: Diagnosis not present

## 2021-09-23 NOTE — Progress Notes (Signed)
? ?Office Visit Note ?  ?Patient: Kelly Soto           ?Date of Birth: [birth year]           ?MRN: [id number] ?Visit Date: 09/23/2021 ?             ?Requested by: [name], MD ?[address] ?[address] ?[place],  [place] [address] ?PCP: [name], MD ? ? ?Assessment & Plan: ?Visit Diagnoses:  ?1. Trigger finger, left ring finger   ? ? ?Plan:  We again discussed the diagnosis, prognosis, and both conservative and operative treatment options for her left ring trigger finger.  She had significant relief from her first corticosteroid injection.  ? ?After our discussion, the patient has elected to proceed with open A1 pulley release.  We reviewed the benefits of surgery and the potential risks including, but not limited to, persistent symptoms, infection, damage to nearby nerves and blood vessels, delayed wound healing, need for additional surgery.   ? ?All patient concerns and questions were addressed. ? ?A surgical date will be confirmed with the patient.   ? ?Follow-Up Instructions: No follow-ups on file.  ? ?Orders:  ?No orders of the defined types were placed in this encounter. ? ?No orders of the defined types were placed in this encounter. ? ? ? ? Procedures: ?No procedures performed ? ? ?Clinical Data: ?No additional findings. ? ? ?Subjective: ?Chief Complaint  ?Patient presents with  ? Left Ring Finger - Follow-up  ? ? ?This is a 67 yo RHD F who presents with left ring finger triggering since October.  She was last seen on 06/10/21 at which time she underwent corticosteroid injection into the ring finger A1 pulley.  She had symptom relief for at least 9 weeks.  Her triggering has returned and is quite painful and bothersome.  She is interested in discussing surgical management today.  ? ? ?Review of Systems ? ? ?Objective: ?Vital Signs: There were no vitals taken for this visit. ? ?Physical Exam ?Constitutional:   ?   Appearance: Normal appearance.  ?Cardiovascular:  ?   Rate and Rhythm: Normal  rate.  ?   Pulses: Normal pulses.  ?Pulmonary:  ?   Effort: Pulmonary effort is normal.  ?Skin: ?   General: Skin is warm and dry.  ?   Capillary Refill: Capillary refill takes less than 2 seconds.  ?Neurological:  ?   Mental Status: She is alert.  ? ? ?Left Hand Exam  ? ?Tenderness  ?Left hand tenderness location: TTP left ring finger A1 pulley with mild swelling.  ? ?Other  ?Erythema: absent ?Sensation: normal ?Pulse: present ? ?Comments:  Palpable and visible triggering of ring finger.  ? ? ? ? ?Specialty Comments:  ?No specialty comments available. ? ?Imaging: ?No results found. ? ? ?PMFS History: ?Patient Active Problem List  ? Diagnosis Date Noted  ? Trigger finger, left ring finger 06/10/2021  ? 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 Chronic renal insufficiency 09/24/2013  ? Anemia of renal disease 09/24/2013  ? H/O gastric bypass 08/22/2013  ? Intestinal malabsorption 08/22/2013  ? Anemia, iron deficiency 08/22/2013  ? Chronic eustachian tube dysfunction 03/28/2011  ? Diabetes mellitus ([place]) 03/28/2011  ? ?Past Medical History:  ?Diagnosis Date  ? Anemia of renal disease 09/24/2013  ? Anxiety   ? Arthritis   ? Chronic renal insufficiency 09/24/2013  ? Diabetes mellitus without complication ([place])   ? Type II  ? Gastroparesis   ?  GERD (gastroesophageal reflux disease)   ? Headache   ? migrains  ? History of hiatal hernia   ? Hypertension   ? PONV (postoperative nausea and vomiting)   ? Sleep apnea   ? Has not used the Cpap since having gastric bypass surgery.  ? Ulcer   ?  ?Family History  ?Problem Relation Age of Onset  ? Atrial fibrillation Mother   ? Heart attack Father   ? Sudden Cardiac Death Father   ? Stroke Maternal Grandmother   ?  ?Past Surgical History:  ?Procedure Laterality Date  ? ABDOMINAL HYSTERECTOMY  1992  ? BREAST BIOPSY Left 03/26/2017  ? benign  ? CARDIAC CATHETERIZATION    ? 36yr ago- negative  ? CARPAL TUNNEL RELEASE Bilateral   ? CHOLECYSTECTOMY N/A 05/11/2016  ?  Procedure: LAPAROSCOPIC CHOLECYSTECTOMY;  Surgeon: TErroll Luna MD;  Location[place] [facility]  Service: General;  Laterality: N/A;  ? EYE SURGERY    ? Bilateral cataracts  ? GASTRIC BYPASS  2009  ? PARTIAL KNEE ARTHROPLASTY Right 05/04/2020  ? Procedure: right partial knee replacement;  Surgeon: DMeredith Pel MD;  Location[place] [facility]  Service: Orthopedics;  Laterality: Right;  ? ?Social History  ? ?Occupational History  ? Not on file  ?Tobacco Use  ? Smoking status: Never  ? Smokeless tobacco: Never  ? Tobacco comments:  ?  never used tobacco  ?Vaping Use  ? Vaping Use: Never used  ?Substance and Sexual Activity  ? Alcohol use: No  ?  Alcohol/week: 0.0 standard drinks  ? Drug use: No  ? Sexual activity: Yes  ? ? ? ? ? ? ?

## 2021-09-23 NOTE — H&P (View-Only) (Signed)
? ?Office Visit Note ?  ?Patient: Kelly Soto           ?Date of Birth: Jul 13, 1955           ?MRN: [id number] ?Visit Date: 09/23/2021 ?             ?Requested by: [name], MD ?[address] ?[address] ?[place],  [place] [address] ?PCP: [name], MD ? ? ?Assessment & Plan: ?Visit Diagnoses:  ?1. Trigger finger, left ring finger   ? ? ?Plan:  We again discussed the diagnosis, prognosis, and both conservative and operative treatment options for her left ring trigger finger.  She had significant relief from her first corticosteroid injection.  ? ?After our discussion, the patient has elected to proceed with open A1 pulley release.  We reviewed the benefits of surgery and the potential risks including, but not limited to, persistent symptoms, infection, damage to nearby nerves and blood vessels, delayed wound healing, need for additional surgery.   ? ?All patient concerns and questions were addressed. ? ?A surgical date will be confirmed with the patient.   ? ?Follow-Up Instructions: No follow-ups on file.  ? ?Orders:  ?No orders of the defined types were placed in this encounter. ? ?No orders of the defined types were placed in this encounter. ? ? ? ? Procedures: ?No procedures performed ? ? ?Clinical Data: ?No additional findings. ? ? ?Subjective: ?Chief Complaint  ?Patient presents with  ? Left Ring Finger - Follow-up  ? ? ?This is a 66 yo RHD F who presents with left ring finger triggering since October.  She was last seen on 06/10/21 at which time she underwent corticosteroid injection into the ring finger A1 pulley.  She had symptom relief for at least 9 weeks.  Her triggering has returned and is quite painful and bothersome.  She is interested in discussing surgical management today.  ? ? ?Review of Systems ? ? ?Objective: ?Vital Signs: There were no vitals taken for this visit. ? ?Physical Exam ?Constitutional:   ?   Appearance: Normal appearance.  ?Cardiovascular:  ?   Rate and Rhythm: Normal  rate.  ?   Pulses: Normal pulses.  ?Pulmonary:  ?   Effort: Pulmonary effort is normal.  ?Skin: ?   General: Skin is warm and dry.  ?   Capillary Refill: Capillary refill takes less than 2 seconds.  ?Neurological:  ?   Mental Status: She is alert.  ? ? ?Left Hand Exam  ? ?Tenderness  ?Left hand tenderness location: TTP left ring finger A1 pulley with mild swelling.  ? ?Other  ?Erythema: absent ?Sensation: normal ?Pulse: present ? ?Comments:  Palpable and visible triggering of ring finger.  ? ? ? ? ?Specialty Comments:  ?No specialty comments available. ? ?Imaging: ?No results found. ? ? ?PMFS History: ?Patient Active Problem List  ? Diagnosis Date Noted  ? Trigger finger, left ring finger 06/10/2021  ? 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 Chronic renal insufficiency 09/24/2013  ? Anemia of renal disease 09/24/2013  ? H/O gastric bypass 08/22/2013  ? Intestinal malabsorption 08/22/2013  ? Anemia, iron deficiency 08/22/2013  ? Chronic eustachian tube dysfunction 03/28/2011  ? Diabetes mellitus ([place]) 03/28/2011  ? ?Past Medical History:  ?Diagnosis Date  ? Anemia of renal disease 09/24/2013  ? Anxiety   ? Arthritis   ? Chronic renal insufficiency 09/24/2013  ? Diabetes mellitus without complication ([name])   ? Type II  ? Gastroparesis   ?  GERD (gastroesophageal reflux disease)   ? Headache   ? migrains  ? History of hiatal hernia   ? Hypertension   ? PONV (postoperative nausea and vomiting)   ? Sleep apnea   ? Has not used the Cpap since having gastric bypass surgery.  ? Ulcer   ?  ?Family History  ?Problem Relation Age of Onset  ? Atrial fibrillation Mother   ? Heart attack Father   ? Sudden Cardiac Death Father   ? Stroke Maternal Grandmother   ?  ?Past Surgical History:  ?Procedure Laterality Date  ? ABDOMINAL HYSTERECTOMY  1992  ? BREAST BIOPSY Left 03/26/2017  ? benign  ? CARDIAC CATHETERIZATION    ? 25yr ago- negative  ? CARPAL TUNNEL RELEASE Bilateral   ? CHOLECYSTECTOMY N/A 05/11/2016  ?  Procedure: LAPAROSCOPIC CHOLECYSTECTOMY;  Surgeon: TErroll Luna MD;  [place] [facility]  Service: General;  Laterality: N/A;  ? EYE SURGERY    ? Bilateral cataracts  ? GASTRIC BYPASS  2009  ? PARTIAL KNEE ARTHROPLASTY Right 05/04/2020  ? Procedure: right partial knee replacement;  Surgeon: DMeredith Pel MD;  Location: [facility]  Service: Orthopedics;  Laterality: Right;  ? ?Social History  ? ?Occupational History  ? Not on file  ?Tobacco Use  ? Smoking status: Never  ? Smokeless tobacco: Never  ? Tobacco comments:  ?  never used tobacco  ?Vaping Use  ? Vaping Use: Never used  ?Substance and Sexual Activity  ? Alcohol use: No  ?  Alcohol/week: 0.0 standard drinks  ? Drug use: No  ? Sexual activity: Yes  ? ? ? ? ? ? ?

## 2021-09-27 ENCOUNTER — Telehealth: Payer: Self-pay | Admitting: Pharmacy Technician

## 2021-09-27 DIAGNOSIS — Z596 Low income: Secondary | ICD-10-CM

## 2021-09-27 NOTE — Progress Notes (Addendum)
ADDENDUM-10/18/2021 ? ?Successful outreach call to patient, HIPAA verified.Patient informs never received the initial mailing on 09/27/21 for the [employer] application for [employer] and the [employer] for [employer]. Remailed applications to patient today after verifying address. ? ?Vella Colquitt P. Aizlynn Digilio, CPhT ?[place]  ?([phone] ? ? ? ? ?                                       [facility])  ?                                          [facility] Quality Pharmacy Team ?  ? ?09/27/2021 ? ?Brooke Dare ?09/24/1955 ?[id number] ? ?                                    Medication Assistance Referral ? ?Referral From:  Kristeen Miss, PharmD ?Referral originated from [place] Raina Mina who informed "Pt has several expensive medications and seeking assistance if available." ?  ?Medication/Company: [employer] / [employer] ?Patient application portion:  Mailed ?Provider application portion: Faxed  to Dr. [name] ?Provider address/fax verified via: Office website ? ?Medication/Company: [employer] / [employer] ?Patient application portion:  Mailed ?Provider application portion: Faxed  to Dr. [name] ?Provider address/fax verified via: Office website ? ? ? ?Deztinee Lohmeyer P. Marletta Bousquet, CPhT ?[place]  ?([phone] ? ?

## 2021-09-28 ENCOUNTER — Other Ambulatory Visit: Payer: Self-pay

## 2021-09-28 ENCOUNTER — Encounter (HOSPITAL_BASED_OUTPATIENT_CLINIC_OR_DEPARTMENT_OTHER): Payer: Self-pay | Admitting: Orthopedic Surgery

## 2021-09-29 ENCOUNTER — Inpatient Hospital Stay: Payer: Medicare Other | Attending: Hematology & Oncology

## 2021-09-29 ENCOUNTER — Encounter (HOSPITAL_BASED_OUTPATIENT_CLINIC_OR_DEPARTMENT_OTHER)
Admission: RE | Admit: 2021-09-29 | Discharge: 2021-09-29 | Disposition: A | Discharge status: Home / Self Care | Payer: Medicare Other | Source: Ambulatory Visit | Attending: Orthopedic Surgery | Admitting: Orthopedic Surgery

## 2021-09-29 DIAGNOSIS — Z01812 Encounter for preprocedural laboratory examination: Secondary | ICD-10-CM | POA: Insufficient documentation

## 2021-09-29 DIAGNOSIS — D631 Anemia in chronic kidney disease: Secondary | ICD-10-CM | POA: Insufficient documentation

## 2021-09-29 DIAGNOSIS — N183 Chronic kidney disease, stage 3 unspecified: Secondary | ICD-10-CM | POA: Insufficient documentation

## 2021-09-29 LAB — BASIC METABOLIC PANEL
Anion gap: 5 (ref 5–15)
BUN: 26 mg/dL — ABNORMAL HIGH (ref 8–23)
CO2: 24 mmol/L (ref 22–32)
Calcium: 9 mg/dL (ref 8.9–10.3)
Chloride: 109 mmol/L (ref 98–111)
Creatinine, Ser: 1.18 mg/dL — ABNORMAL HIGH (ref 0.44–1.00)
GFR, Estimated: 51 mL/min — ABNORMAL LOW (ref >60)
Glucose, Bld: 192 mg/dL — ABNORMAL HIGH (ref 70–99)
Potassium: 5.7 mmol/L — ABNORMAL HIGH (ref 3.5–5.1)
Sodium: 138 mmol/L (ref 135–145)

## 2021-09-29 NOTE — Progress Notes (Signed)

## 2021-09-30 ENCOUNTER — Inpatient Hospital Stay (HOSPITAL_BASED_OUTPATIENT_CLINIC_OR_DEPARTMENT_OTHER): Payer: Medicare Other | Admitting: Hematology & Oncology

## 2021-09-30 ENCOUNTER — Other Ambulatory Visit: Payer: Self-pay

## 2021-09-30 ENCOUNTER — Encounter: Payer: Self-pay | Admitting: Hematology & Oncology

## 2021-09-30 ENCOUNTER — Telehealth: Payer: Self-pay

## 2021-09-30 ENCOUNTER — Inpatient Hospital Stay: Payer: Medicare Other

## 2021-09-30 VITALS — BP 169/90 | HR 77 | Temp 98.2°F | Resp 18 | Ht 60.0 in | Wt 150.0 lb

## 2021-09-30 DIAGNOSIS — N189 Chronic kidney disease, unspecified: Secondary | ICD-10-CM

## 2021-09-30 DIAGNOSIS — N183 Chronic kidney disease, stage 3 unspecified: Secondary | ICD-10-CM | POA: Diagnosis present

## 2021-09-30 DIAGNOSIS — D631 Anemia in chronic kidney disease: Secondary | ICD-10-CM

## 2021-09-30 DIAGNOSIS — N1832 Chronic kidney disease, stage 3b: Secondary | ICD-10-CM

## 2021-09-30 LAB — IRON AND IRON BINDING CAPACITY (CC-WL,HP ONLY)
Iron: 90 ug/dL (ref 28–170)
Saturation Ratios: 30 % (ref 10.4–31.8)
TIBC: 302 ug/dL (ref 250–450)
UIBC: 212 ug/dL (ref 148–442)

## 2021-09-30 LAB — CBC WITH DIFFERENTIAL (CANCER CENTER ONLY)
Abs Immature Granulocytes: 0.02 10*3/uL (ref 0.00–0.07)
Basophils Absolute: 0 10*3/uL (ref 0.0–0.1)
Basophils Relative: 1 %
Eosinophils Absolute: 0.1 10*3/uL (ref 0.0–0.5)
Eosinophils Relative: 2 %
HCT: 33 % — ABNORMAL LOW (ref 36.0–46.0)
Hemoglobin: 10.2 g/dL — ABNORMAL LOW (ref 12.0–15.0)
Immature Granulocytes: 0 %
Lymphocytes Relative: 20 %
Lymphs Abs: 1.4 10*3/uL (ref 0.7–4.0)
MCH: 29.4 pg (ref 26.0–34.0)
MCHC: 30.9 g/dL (ref 30.0–36.0)
MCV: 95.1 fL (ref 80.0–100.0)
Monocytes Absolute: 0.5 10*3/uL (ref 0.1–1.0)
Monocytes Relative: 8 %
Neutro Abs: 4.7 10*3/uL (ref 1.7–7.7)
Neutrophils Relative %: 69 %
Platelet Count: 226 10*3/uL (ref 150–400)
RBC: 3.47 MIL/uL — ABNORMAL LOW (ref 3.87–5.11)
RDW: 14.6 % (ref 11.5–15.5)
WBC Count: 6.8 10*3/uL (ref 4.0–10.5)
nRBC: 0 % (ref 0.0–0.2)

## 2021-09-30 LAB — CMP (CANCER CENTER ONLY)
ALT: 14 U/L (ref 0–44)
AST: 15 U/L (ref 15–41)
Albumin: 4.1 g/dL (ref 3.5–5.0)
Alkaline Phosphatase: 42 U/L (ref 38–126)
Anion gap: 7 (ref 5–15)
BUN: 26 mg/dL — ABNORMAL HIGH (ref 8–23)
CO2: 27 mmol/L (ref 22–32)
Calcium: 9.6 mg/dL (ref 8.9–10.3)
Chloride: 104 mmol/L (ref 98–111)
Creatinine: 1.22 mg/dL — ABNORMAL HIGH (ref 0.44–1.00)
GFR, Estimated: 49 mL/min — ABNORMAL LOW (ref >60)
Glucose, Bld: 168 mg/dL — ABNORMAL HIGH (ref 70–99)
Potassium: 5.3 mmol/L — ABNORMAL HIGH (ref 3.5–5.1)
Sodium: 138 mmol/L (ref 135–145)
Total Bilirubin: 0.3 mg/dL (ref 0.3–1.2)
Total Protein: 6.7 g/dL (ref 6.5–8.1)

## 2021-09-30 LAB — FERRITIN: Ferritin: 602 ng/mL — ABNORMAL HIGH (ref 11–307)

## 2021-09-30 MED ORDER — DARBEPOETIN ALFA 300 MCG/0.6ML IJ SOSY
300.0000 ug | PREFILLED_SYRINGE | Freq: Once | INTRAMUSCULAR | Status: AC
  Administered 2021-09-30: 300 ug via SUBCUTANEOUS
  Filled 2021-09-30: qty 0.6

## 2021-09-30 NOTE — Telephone Encounter (Signed)
Advised via MyChart.

## 2021-09-30 NOTE — Telephone Encounter (Signed)
-----   Message from Volanda Napoleon, MD sent at 09/30/2021  1:37 PM EDT ----- ?Please call and let her know that the iron studies look great.  Have a wonderful Easter.  Thanks.  Pete ?

## 2021-09-30 NOTE — Progress Notes (Signed)
Hematology and Oncology Follow Up Visit ? ?Kelly Soto ?[id number] ?1955-10-29 66 y.o. ?09/30/2021 ? ? 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 ?Current Therapy:   ?IV iron as indicated -- Feraheme on 05/2020 ?Aranesp 300 mcg subcutaneous as needed for hemoglobin less than 11 - ?    ?Interim History:  [name] is back for follow-up.  So far, she is doing pretty well.  She is going to have hand surgery for a trigger finger next week.  I do not see any problems with her having this. ? ?Her diabetes is still a little bit on the higher side.  We will have to see what her blood sugars are today. ? ?Her iron studies more last saw her showed a ferritin of 611 with an iron saturation of 44%. ? ?She has had no problems with bleeding.  There is no change in bowel or bladder habits.  She has had no issues with rashes.  Is been no leg swelling. ? ?Overall, I would say her performance status is probably ECOG 1.  ?    ? ? Medications:  ?Current Outpatient Medications:  ?  Ascorbic Acid (VITAMIN C) 1000 MG tablet, Take 1,000 mg by mouth daily., Disp: , Rfl:  ?  baclofen (LIORESAL) 10 MG tablet, 1 tablet with food or milk as needed, Disp: , Rfl:  ?  BD PEN NEEDLE NANO U/F 32G X 4 MM MISC, USE 1 ONCE DAILY WITH TRESIBA, Disp: , Rfl:  ?  betamethasone valerate (VALISONE) 0.1 % cream, Apply 1 application topically daily as needed (irritation)., Disp: , Rfl:  ?  Blood Glucose Monitoring Suppl (CONTOUR NEXT EZ) w/Device KIT, USE TO CHECK BLOOD SUGARS DAILY, Disp: , Rfl:  ?  carvedilol (COREG) 12.5 MG tablet, Take 12.5 mg by mouth in the morning and at bedtime., Disp: , Rfl:  ?  chlorthalidone (HYGROTON) 25 MG tablet, Take 25 mg by mouth daily., Disp: , Rfl:  ?  CONTOUR NEXT TEST test strip, USE STRIP TO CHECK GLUCOSE ONCE DAILY, Disp: , Rfl:  ?  denosumab (PROLIA) 60 MG/ML SOLN injection, Inject 60 mg into the skin every 6  (six) months. Administer in upper arm, thigh, or abdomen  Next injection is due 05-2016, Disp: , Rfl:  ?  diazepam (VALIUM) 5 MG tablet, Take 5 mg by mouth daily as needed for anxiety. , Disp: , Rfl: 0 ?  fluocinonide (LIDEX) 0.05 % external solution, Apply 1 application. topically 2 (two) times daily., Disp: , Rfl:  ?  fluticasone (FLONASE) 50 MCG/ACT nasal spray, Place 2 sprays into both nostrils at bedtime., Disp: , Rfl:  ?  Galcanezumab-gnlm (EMGALITY) 120 MG/ML SOAJ, Inject 120 mg into the skin every 30 (thirty) days., Disp: , Rfl:  ?  hydroxypropyl methylcellulose / hypromellose (ISOPTO TEARS / GONIOVISC) 2.5 % ophthalmic solution, Place 1 drop into both eyes 3 (three) times daily as needed for dry eyes., Disp: , Rfl:  ?  metFORMIN (GLUCOPHAGE) 500 MG tablet, Take 1,000 mg by mouth 2 (two) times daily with a meal., Disp: , Rfl:  ?  metroNIDAZOLE (METROGEL) 0.75 % gel, Apply 1 application. topically 2 (two) times daily., Disp: , Rfl:  ?  Microlet Lancets MISC, USE AS DIRECTED TO CHECK BLOOD SUGAR, Disp: , Rfl:  ?  pantoprazole (PROTONIX) 40 MG tablet, Take 40 mg by mouth daily., Disp: , Rfl:  ?  pioglitazone (ACTOS) 45 MG tablet, Take  45 mg by mouth daily., Disp: , Rfl:  ?  Simethicone (GAS FREE EXTRA STRENGTH PO), Take 2 tablets by mouth at bedtime., Disp: , Rfl:  ?  sodium chloride (OCEAN) 0.65 % nasal spray, 2 sprays, Disp: , Rfl:  ?  sucralfate (CARAFATE) 1 g tablet, Take 1 g by mouth 2 (two) times daily., Disp: , Rfl:  ?  tobramycin (TOBREX) 0.3 % ophthalmic solution, Place 1 drop into both eyes See admin instructions. Instill one drop into the affected eye four times a day prior and the day after eye injection., Disp: , Rfl:  ?  TRESIBA FLEXTOUCH 100 UNIT/ML SOPN FlexTouch Pen, Inject 12 Units into the skin daily. , Disp: , Rfl:  ?  triamcinolone (KENALOG) 0.025 % cream, Apply 1 application. topically 2 (two) times daily., Disp: , Rfl:  ?  triamcinolone cream (KENALOG) 0.1 %, Apply 1 application.  topically 2 (two) times daily., Disp: , Rfl:  ? ?Allergies:  ?Allergies  ?Allergen Reactions  ? Ace Inhibitors Other (See Comments) and Anaphylaxis  ?  Angioedema  ? Aspirin Other (See Comments)  ?  Cannot take due to gastric bypass surgery  ? Nsaids Anaphylaxis and Other (See Comments)  ?  S/p gastric bypass ?Cannot take orally due to gastric bypass ?S/p gastric bypass  ? Sular [Nisoldipine Er] Other (See Comments)  ?  angioedema  ? Nisoldipine Swelling and Other (See Comments)  ? Other Other (See Comments)  ? Boniva [Ibandronic Acid] Other (See Comments)  ?  Joint pain  ? ? ?Past Medical History, Surgical history, Social history, and Family History were reviewed and updated. ? ?Review of Systems: ?Review of Systems  ?Constitutional: Negative.   ?HENT: Negative.    ?Eyes: Negative.   ?Respiratory: Negative.    ?Cardiovascular: Negative.   ?Gastrointestinal: Negative.   ?Genitourinary: Negative.   ?Musculoskeletal: Negative.   ?Skin: Negative.   ?Neurological: Negative.   ?Endo/Heme/Allergies: Negative.   ?Psychiatric/Behavioral: Negative.    ? ?Physical Exam: ? vitals were not taken for this visit.  ? ?Physical Exam ?Vitals reviewed.  ?HENT:  ?   Head: Normocephalic and atraumatic.  ?Eyes:  ?   Pupils: Pupils are equal, round, and reactive to light.  ?Cardiovascular:  ?   Rate and Rhythm: Normal rate and regular rhythm.  ?   Heart sounds: Normal heart sounds.  ?Pulmonary:  ?   Effort: Pulmonary effort is normal.  ?   Breath sounds: Normal breath sounds.  ?Abdominal:  ?   General: Bowel sounds are normal.  ?   Palpations: Abdomen is soft.  ?Musculoskeletal:     ?   General: No tenderness or deformity. Normal range of motion.  ?   Cervical back: Normal range of motion.  ?Lymphadenopathy:  ?   Cervical: No cervical adenopathy.  ?Skin: ?   General: Skin is warm and dry.  ?   Findings: No erythema or rash.  ?Neurological:  ?   Mental Status: She is alert and oriented to person, place, and time.  ?Psychiatric:     ?    Behavior: Behavior normal.     ?   Thought Content: Thought content normal.     ?   Judgment: Judgment normal.  ? ? ? ?Lab Results  ?Component Value Date  ? WBC 6.8 09/30/2021  ? HGB 10.2 (L) 09/30/2021  ? HCT 33.0 (L) 09/30/2021  ? MCV 95.1 09/30/2021  ? PLT 226 09/30/2021  ? ?  Chemistry   ?   ?Component  Value Date/Time  ? NA 138 09/29/2021 1230  ? NA 145 05/10/2017 0748  ? NA 137 12/06/2016 0746  ? K 5.7 (H) 09/29/2021 1230  ? K 4.7 05/10/2017 0748  ? K 5.2 (H) 12/06/2016 0746  ? CL 109 09/29/2021 1230  ? CL 110 (H) 05/10/2017 0748  ? CO2 24 09/29/2021 1230  ? CO2 23 05/10/2017 0748  ? CO2 23 12/06/2016 0746  ? BUN 26 (H) 09/29/2021 1230  ? BUN 28 (H) 05/10/2017 0748  ? BUN 26.1 (H) 12/06/2016 0746  ? CREATININE 1.18 (H) 09/29/2021 1230  ? CREATININE 1.16 (H) 08/25/2021 4854  ? CREATININE 1.2 05/10/2017 0748  ? CREATININE 1.2 (H) 12/06/2016 0746  ?    ?Component Value Date/Time  ? CALCIUM 9.0 09/29/2021 1230  ? CALCIUM 9.1 05/10/2017 0748  ? CALCIUM 9.1 12/06/2016 0746  ? ALKPHOS 52 08/25/2021 0738  ? ALKPHOS 61 05/10/2017 0748  ? ALKPHOS 41 12/06/2016 0746  ? AST 17 08/25/2021 0738  ? AST 23 12/06/2016 0746  ? ALT 15 08/25/2021 0738  ? ALT 31 05/10/2017 0748  ? ALT 26 12/06/2016 0746  ? BILITOT 0.3 08/25/2021 0738  ? BILITOT 0.26 12/06/2016 0746  ?  ? ? ? ?Impression and Plan: ?Ms. Hakim is 66 year old white female. She has diabetes. She has iron deficiency anemia. She has a very low erythropoietin level. ? ?We will going give her Aranesp today.  I would think her iron studies should be okay.  It will be interesting to see what her blood sugar is. ? ?She has always had an elevated potassium.  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 ? ?We will plan to get her back down probably before [facility] Day. ? ?Volanda Napoleon, MD ?4/7/20238:29 AM ? ?

## 2021-09-30 NOTE — Patient Instructions (Signed)
Darbepoetin Alfa injection ?What is this medication? ?DARBEPOETIN ALFA (dar be POE e tin  AL fa) helps your body make more red blood cells. It is used to treat anemia caused by chronic kidney failure and chemotherapy. ?This medicine may be used for other purposes; ask your health care provider or pharmacist if you have questions. ?COMMON BRAND NAME(S): Aranesp ?What should I tell my care team before I take this medication? ?They need to know if you have any of these conditions: ?blood clotting disorders or history of blood clots ?cancer patient not on chemotherapy ?cystic fibrosis ?heart disease, such as angina, heart failure, or a history of a heart attack ?hemoglobin level of 12 g/dL or greater ?high blood pressure ?low levels of folate, iron, or vitamin B12 ?seizures ?an unusual or allergic reaction to darbepoetin, erythropoietin, albumin, hamster proteins, latex, other medicines, foods, dyes, or preservatives ?pregnant or trying to get pregnant ?breast-feeding ?How should I use this medication? ?This medicine is for injection into a vein or under the skin. It is usually given by a health care professional in a hospital or clinic setting. ?If you get this medicine at home, you will be taught how to prepare and give this medicine. Use exactly as directed. Take your medicine at regular intervals. Do not take your medicine more often than directed. ?It is important that you put your used needles and syringes in a special sharps container. Do not put them in a trash can. If you do not have a sharps container, call your pharmacist or healthcare provider to get one. ?A special MedGuide will be given to you by the pharmacist with each prescription and refill. Be sure to read this information carefully each time. ?Talk to your pediatrician regarding the use of this medicine in children. While this medicine may be used in children as young as 1 month of age for selected conditions, precautions do apply. ?Overdosage: If  you think you have taken too much of this medicine contact a poison control center or emergency room at once. ?NOTE: This medicine is only for you. Do not share this medicine with others. ?What if I miss a dose? ?If you miss a dose, take it as soon as you can. If it is almost time for your next dose, take only that dose. Do not take double or extra doses. ?What may interact with this medication? ?Do not take this medicine with any of the following medications: ?epoetin alfa ?This list may not describe all possible interactions. Give your health care provider a list of all the medicines, herbs, non-prescription drugs, or dietary supplements you use. Also tell them if you smoke, drink alcohol, or use illegal drugs. Some items may interact with your medicine. ?What should I watch for while using this medication? ?Your condition will be monitored carefully while you are receiving this medicine. ?You may need blood work done while you are taking this medicine. ?This medicine may cause a decrease in vitamin B6. You should make sure that you get enough vitamin B6 while you are taking this medicine. Discuss the foods you eat and the vitamins you take with your health care professional. ?What side effects may I notice from receiving this medication? ?Side effects that you should report to your doctor or health care professional as soon as possible: ?allergic reactions like skin rash, itching or hives, swelling of the face, lips, or tongue ?breathing problems ?changes in vision ?chest pain ?confusion, trouble speaking or understanding ?feeling faint or lightheaded, falls ?high blood   pressure ?muscle aches or pains ?pain, swelling, warmth in the leg ?rapid weight gain ?severe headaches ?sudden numbness or weakness of the face, arm or leg ?trouble walking, dizziness, loss of balance or coordination ?seizures (convulsions) ?swelling of the ankles, feet, hands ?unusually weak or tired ?Side effects that usually do not require  medical attention (report to your doctor or health care professional if they continue or are bothersome): ?diarrhea ?fever, chills (flu-like symptoms) ?headaches ?nausea, vomiting ?redness, stinging, or swelling at site where injected ?This list may not describe all possible side effects. Call your doctor for medical advice about side effects. You may report side effects to FDA at 1-800-FDA-1088. ?Where should I keep my medication? ?Keep out of the reach of children. ?Store in a refrigerator between 2 and 8 degrees C (36 and 46 degrees F). Do not freeze. Do not shake. Throw away any unused portion if using a single-dose vial. Throw away any unused medicine after the expiration date. ?NOTE: This sheet is a summary. It may not cover all possible information. If you have questions about this medicine, talk to your doctor, pharmacist, or health care provider. ?? 2022 Elsevier/Gold Standard (2017-07-02 00:00:00) ? ?

## 2021-10-05 ENCOUNTER — Ambulatory Visit (HOSPITAL_BASED_OUTPATIENT_CLINIC_OR_DEPARTMENT_OTHER)
Admission: RE | Admit: 2021-10-05 | Discharge: 2021-10-05 | Disposition: A | Discharge status: Home / Self Care | Payer: Medicare Other | Attending: Orthopedic Surgery | Admitting: Orthopedic Surgery

## 2021-10-05 ENCOUNTER — Encounter (HOSPITAL_BASED_OUTPATIENT_CLINIC_OR_DEPARTMENT_OTHER)
Admission: RE | Disposition: A | Discharge status: Home / Self Care | Payer: Self-pay | Source: Home / Self Care | Attending: Orthopedic Surgery

## 2021-10-05 ENCOUNTER — Ambulatory Visit (HOSPITAL_BASED_OUTPATIENT_CLINIC_OR_DEPARTMENT_OTHER): Payer: Medicare Other | Admitting: Anesthesiology

## 2021-10-05 ENCOUNTER — Other Ambulatory Visit: Payer: Self-pay

## 2021-10-05 ENCOUNTER — Encounter (HOSPITAL_BASED_OUTPATIENT_CLINIC_OR_DEPARTMENT_OTHER): Payer: Self-pay | Admitting: Orthopedic Surgery

## 2021-10-05 DIAGNOSIS — E119 Type 2 diabetes mellitus without complications: Secondary | ICD-10-CM

## 2021-10-05 DIAGNOSIS — E1122 Type 2 diabetes mellitus with diabetic chronic kidney disease: Secondary | ICD-10-CM | POA: Insufficient documentation

## 2021-10-05 DIAGNOSIS — I10 Essential (primary) hypertension: Secondary | ICD-10-CM | POA: Diagnosis not present

## 2021-10-05 DIAGNOSIS — N289 Disorder of kidney and ureter, unspecified: Secondary | ICD-10-CM

## 2021-10-05 DIAGNOSIS — I129 Hypertensive chronic kidney disease with stage 1 through stage 4 chronic kidney disease, or unspecified chronic kidney disease: Secondary | ICD-10-CM | POA: Insufficient documentation

## 2021-10-05 DIAGNOSIS — N189 Chronic kidney disease, unspecified: Secondary | ICD-10-CM | POA: Diagnosis not present

## 2021-10-05 DIAGNOSIS — M65342 Trigger finger, left ring finger: Secondary | ICD-10-CM | POA: Diagnosis present

## 2021-10-05 HISTORY — PX: TRIGGER FINGER RELEASE: SHX641

## 2021-10-05 LAB — GLUCOSE, CAPILLARY
Glucose-Capillary: 187 mg/dL — ABNORMAL HIGH (ref 70–99)
Glucose-Capillary: 174 mg/dL — ABNORMAL HIGH (ref 70–99)

## 2021-10-05 SURGERY — RELEASE, A1 PULLEY, FOR TRIGGER FINGER
Anesthesia: Monitor Anesthesia Care | Site: Hand | Laterality: Left

## 2021-10-05 MED ORDER — LACTATED RINGERS IV SOLN: INTRAVENOUS | Status: DC

## 2021-10-05 MED ORDER — BUPIVACAINE HCL (PF) 0.25 % IJ SOLN
INTRAMUSCULAR | Status: DC | PRN
  Administered 2021-10-05: 2.5 mL

## 2021-10-05 MED ORDER — ONDANSETRON HCL 4 MG/2ML IJ SOLN: 4.0000 mg | Freq: Once | INTRAMUSCULAR | Status: DC | PRN

## 2021-10-05 MED ORDER — PROPOFOL 10 MG/ML IV BOLUS
INTRAVENOUS | Status: AC
  Filled 2021-10-05: qty 20

## 2021-10-05 MED ORDER — FENTANYL CITRATE (PF) 100 MCG/2ML IJ SOLN: 25.0000 ug | INTRAMUSCULAR | Status: DC | PRN

## 2021-10-05 MED ORDER — OXYCODONE HCL 5 MG/5ML PO SOLN: 5.0000 mg | Freq: Once | ORAL | Status: DC | PRN

## 2021-10-05 MED ORDER — ACETAMINOPHEN 10 MG/ML IV SOLN: 1000.0000 mg | Freq: Once | INTRAVENOUS | Status: DC | PRN

## 2021-10-05 MED ORDER — ONDANSETRON HCL 4 MG/2ML IJ SOLN
INTRAMUSCULAR | Status: AC
  Filled 2021-10-05: qty 2

## 2021-10-05 MED ORDER — PROPOFOL 10 MG/ML IV BOLUS
INTRAVENOUS | Status: DC | PRN
  Administered 2021-10-05: 40 mg via INTRAVENOUS
  Administered 2021-10-05 (×2): 10 mg via INTRAVENOUS

## 2021-10-05 MED ORDER — PROPOFOL 500 MG/50ML IV EMUL
INTRAVENOUS | Status: DC | PRN
  Administered 2021-10-05: 75 ug/kg/min via INTRAVENOUS

## 2021-10-05 MED ORDER — OXYCODONE HCL 5 MG PO TABS: 5.0000 mg | ORAL_TABLET | Freq: Once | ORAL | Status: DC | PRN

## 2021-10-05 MED ORDER — ONDANSETRON HCL 4 MG/2ML IJ SOLN
INTRAMUSCULAR | Status: DC | PRN
  Administered 2021-10-05: 4 mg via INTRAVENOUS

## 2021-10-05 SURGICAL SUPPLY — 35 items
APL PRP STRL LF DISP 70% ISPRP (MISCELLANEOUS) ×1
BLADE SURG 15 STRL LF DISP TIS (BLADE) ×1 IMPLANT
BLADE SURG 15 STRL SS (BLADE) ×4
BNDG CMPR 9X4 STRL LF SNTH (GAUZE/BANDAGES/DRESSINGS) ×1
BNDG ELASTIC 2X5.8 VLCR STR LF (GAUZE/BANDAGES/DRESSINGS) ×2 IMPLANT
BNDG ESMARK 4X9 LF (GAUZE/BANDAGES/DRESSINGS) ×2 IMPLANT
CHLORAPREP W/TINT 26 (MISCELLANEOUS) ×2 IMPLANT
CORD BIPOLAR FORCEPS 12FT (ELECTRODE) ×2 IMPLANT
COVER BACK TABLE 60X90IN (DRAPES) ×2 IMPLANT
COVER MAYO STAND STRL (DRAPES) ×2 IMPLANT
CUFF TOURN SGL QUICK 18X4 (TOURNIQUET CUFF) IMPLANT
CUFF TOURN SGL QUICK 24 (TOURNIQUET CUFF)
CUFF TRNQT CYL 24X4X16.5-23 (TOURNIQUET CUFF) IMPLANT
DRAPE EXTREMITY T 121X128X90 (DISPOSABLE) ×2 IMPLANT
DRAPE SURG 17X23 STRL (DRAPES) ×2 IMPLANT
GAUZE SPONGE 4X4 12PLY STRL (GAUZE/BANDAGES/DRESSINGS) IMPLANT
GAUZE XEROFORM 1X8 LF (GAUZE/BANDAGES/DRESSINGS) ×2 IMPLANT
GLOVE BIOGEL PI IND STRL 7.0 (GLOVE) ×1 IMPLANT
GLOVE BIOGEL PI INDICATOR 7.0 (GLOVE) ×1
GLOVE SURG ENC MOIS LTX SZ7 (GLOVE) ×2 IMPLANT
GOWN STRL REUS W/ TWL LRG LVL3 (GOWN DISPOSABLE) ×1 IMPLANT
GOWN STRL REUS W/ TWL XL LVL3 (GOWN DISPOSABLE) ×1 IMPLANT
GOWN STRL REUS W/TWL LRG LVL3 (GOWN DISPOSABLE) ×2
GOWN STRL REUS W/TWL XL LVL3 (GOWN DISPOSABLE) ×2
NDL HYPO 25X1 1.5 SAFETY (NEEDLE) ×1 IMPLANT
NEEDLE HYPO 25X1 1.5 SAFETY (NEEDLE) ×2 IMPLANT
NS IRRIG 1000ML POUR BTL (IV SOLUTION) ×2 IMPLANT
PACK BASIN DAY SURGERY FS (CUSTOM PROCEDURE TRAY) ×2 IMPLANT
SLEEVE SCD COMPRESS KNEE MED (STOCKING) IMPLANT
SUT ETHILON 4 0 PS 2 18 (SUTURE) ×2 IMPLANT
SUT VICRYL 4-0 PS2 18IN ABS (SUTURE) IMPLANT
SYR BULB EAR ULCER 3OZ GRN STR (SYRINGE) ×2 IMPLANT
SYR CONTROL 10ML LL (SYRINGE) ×2 IMPLANT
TOWEL GREEN STERILE FF (TOWEL DISPOSABLE) ×2 IMPLANT
UNDERPAD 30X36 HEAVY ABSORB (UNDERPADS AND DIAPERS) ×2 IMPLANT

## 2021-10-05 NOTE — Anesthesia Procedure Notes (Signed)
Procedure Name: [place] ?Date/Time: 10/05/2021 11:03 AM ?Performed by: Glory Buff, CRNA ?Pre-anesthesia Checklist: Patient identified, Emergency Drugs available, Suction available and Patient being monitored ?Patient Re-evaluated:Patient Re-evaluated prior to induction ?Oxygen Delivery Method: Simple face mask ? ? ? ? ?

## 2021-10-05 NOTE — Transfer of Care (Signed)
Immediate Anesthesia Transfer of Care Note ? ?Patient: Kelly Soto ? ?Procedure(s) Performed: LEFT RING FINGER RELEASE TRIGGER FINGER/A-1 PULLEY (Left: Hand) ? ?Patient Location: PACU ? ?Anesthesia Type:MAC ? ?Level of Consciousness: awake, alert  and oriented ? ?Airway & Oxygen Therapy: Patient Spontanous Breathing and Patient connected to face mask oxygen ? ?Post-op Assessment: Report given to RN and Post -op Vital signs reviewed and stable ? ?Post vital signs: Reviewed and stable ? ?Last Vitals:  ?Vitals Value Taken Time  ?BP 172/60 10/05/21 1136  ?Temp    ?Pulse 65 10/05/21 1136  ?Resp 16 10/05/21 1136  ?SpO2 100 % 10/05/21 1136  ?Vitals shown include unvalidated device data. ? ?Last Pain:  ?Vitals:  ? 10/05/21 1010  ?TempSrc: Oral  ?PainSc: 0-No pain  ?   ? ?  ? ?Complications: No notable events documented. ?

## 2021-10-05 NOTE — Interval H&P Note (Signed)
History and Physical Interval Note: ? ?10/05/2021 ?10:13 AM ? ?Kelly Soto  has presented today for surgery, with the diagnosis of LEFT RING FINGER TRIGGER FINGER.  The various methods of treatment have been discussed with the patient and family. After consideration of risks, benefits and other options for treatment, the patient has consented to  Procedure(s): ?LEFT RING FINGER RELEASE TRIGGER FINGER/A-1 PULLEY (Left) as a surgical intervention.  The patient's history has been reviewed, patient examined, no change in status, stable for surgery.  I have reviewed the patient's chart and labs.  Questions were answered to the patient's satisfaction.   ? ? ?[name] ? ? ?

## 2021-10-05 NOTE — Op Note (Signed)
? ?  Date of Surgery: 10/05/2021 ? ?INDICATIONS: Patient is a 66 y.o.-year-old female with left ring finger trigger finger that has failed conservative management.  Risks, benefits, and alternatives to surgery were again discussed with the patient in the preoperative area. The patient wishes to proceed with surgery.  Informed consent was signed after our discussion.  ? ?PREOPERATIVE DIAGNOSIS:  ?Left ring finger trigger finger ? ?POSTOPERATIVE DIAGNOSIS: Same. ? ?PROCEDURE:  ?Left ring finger A1 pulley release ? ? ?SURGEON: Audria Nine, M.D. ? ?ASSIST:  ? ?ANESTHESIA:  Local, MAC ? ?IV FLUIDS AND URINE: See anesthesia. ? ?ESTIMATED BLOOD LOSS: <5 mL. ? ?IMPLANTS: * No implants in log *  ? ?DRAINS: None ? ?COMPLICATIONS: None ? ?DESCRIPTION OF PROCEDURE: The patient was met in the preoperative holding area where the surgical site was marked and the consent form was verified.  The patient was then taken to the operating room and transferred to the operating table.  All bony prominences were well padded.  A tourniquet was applied to the left forearm.  Monitored sedation was induced.  A formal time-out was performed to confirm that this was the correct patient, surgery, side, and site. A local block was performed using 1% lidocaine and 0.25% marcaine.  The operative extremity was prepped and draped in the usual and sterile fashion.   ? ?Following a second timeout, the limb was exsanguinated and the tourniquet inflated to 250 mmHg.  A longitudinal incision was made over the A1 pulley.  The skin was incised.  Blunt dissection was used to identify the A1 pulley.  Two Ragnell retractors were placed on the radial and ulnar sides of the pulley to protect the respective neurovascular bundles.  A third Ragnell was placed at the distal aspect of the wound.  The A1 pulley was clearly identified.  Under direct visualization, the pulley was entered sharply using a 15 blade.  Tenotomy scissors were used to complete the pulley  release distally to the level of the A2 pulley.  The distal retractor was then placed in the proximal aspect of the wound.  Under direct visualization, the proximal aspect of the A1 pulley was completely released.  ? ?Following satisfactory A1 pulley release, the patient was reversed from sedation and asked to fully flex and extend the involved finger.  There was no catching or triggering present.  The tourniquet was let down and hemostasis achieved with direct pressure and bipolar electrocautery.  The wound was then thoroughly irrigated.  It was closed using 4-0 nylon sutures in a horizontal mattress fashion.  The wound was dressed with xeroform, 4x4, and an ace wrap. ? ? ?POSTOPERATIVE PLAN:  She will be discharged to home with appropriate pain medication and discharge instructions.  I will see her back in 10-14 days for her first postop visit.  ? ?Audria Nine, MD ?11:34 AM  ?

## 2021-10-05 NOTE — Brief Op Note (Signed)
10/05/2021 ? ?11:33 AM ? ?PATIENT:  Kelly Soto  66 y.o. female ? ?PRE-OPERATIVE DIAGNOSIS:  LEFT RING FINGER TRIGGER FINGER ? ?POST-OPERATIVE DIAGNOSIS:  LEFT RING FINGER TRIGGER FINGER ? ?PROCEDURE:  Procedure(s): ?LEFT RING FINGER RELEASE TRIGGER FINGER/A-1 PULLEY (Left) ? ?SURGEON:  Surgeon(s) and Role: ?   * Sherilyn Cooter, MD - Primary ? ?PHYSICIAN ASSISTANT:  ? ?ASSISTANTS: none  ? ?ANESTHESIA:   local and MAC ? ?EBL:  1 mL  ? ?BLOOD ADMINISTERED:none ? ?DRAINS: none  ? ?LOCAL MEDICATIONS USED:  MARCAINE    and LIDOCAINE  ? ?SPECIMEN:  No Specimen ? ?DISPOSITION OF SPECIMEN:  N/A ? ?COUNTS:  YES ? ?TOURNIQUET:   ?Total Tourniquet Time Documented: ?Forearm (Left) - 5 minutes ?Total: Forearm (Left) - 5 minutes ? ? ?DICTATION: .Dragon Dictation ? ?PLAN OF CARE: Discharge to home after PACU ? ?PATIENT DISPOSITION:  PACU - hemodynamically stable. ?  ?Delay start of Pharmacological VTE agent (>24hrs) due to surgical blood loss or risk of bleeding: not applicable ? ?

## 2021-10-05 NOTE — Anesthesia Postprocedure Evaluation (Signed)
Anesthesia Post Note ? ?Patient: Kelly Soto ? ?Procedure(s) Performed: LEFT RING FINGER RELEASE TRIGGER FINGER/A-1 PULLEY (Left: Hand) ? ?  ? ?Patient location during evaluation: PACU ?Anesthesia Type: MAC ?Level of consciousness: awake and alert ?Pain management: pain level controlled ?Vital Signs Assessment: post-procedure vital signs reviewed and stable ?Respiratory status: spontaneous breathing, nonlabored ventilation, respiratory function stable and patient connected to nasal cannula oxygen ?Cardiovascular status: stable and blood pressure returned to baseline ?Postop Assessment: no apparent nausea or vomiting ?Anesthetic complications: no ? ? ?No notable events documented. ? ?Last Vitals:  ?Vitals:  ? 10/05/21 1145 10/05/21 1159  ?BP: (!) 180/67 (!) 189/74  ?Pulse: 60 62  ?Resp: 14 12  ?Temp:    ?SpO2: 100% 98%  ?  ?Last Pain:  ?Vitals:  ? 10/05/21 1210  ?TempSrc:   ?PainSc: 0-No pain  ? ? ?  ?  ?  ?  ?  ?  ? ?Pistol Kessenich S ? ? ? ? ?

## 2021-10-05 NOTE — Discharge Instructions (Addendum)
 Charles Benfield, M.D. Hand Surgery  POST-OPERATIVE DISCHARGE INSTRUCTIONS   PRESCRIPTIONS: - You have been given a prescription to be taken as directed for post-operative pain control.  You may also take over the counter ibuprofen/aleve and tylenol for pain. Take this as directed on the packaging. Do not exceed 3000 mg tylenol/acetaminophen in 24 hours.  Ibuprofen 600-800 mg (3-4) tablets by mouth every 6 hours as needed for pain.   OR  Aleve 2 tablets by mouth every 12 hours (twice daily) as needed for pain.   AND/OR  Tylenol 1000 mg (2 tablets) every 8 hours as needed for pain.  - Please use your pain medication carefully, as refills are limited and you may not be provided with one.  As stated above, please use over the counter pain medicine - it will also be helpful with decreasing your swelling.    ANESTHESIA: -After your surgery, post-surgical discomfort or pain is likely. This discomfort can last several days to a few weeks. At certain times of the day your discomfort may be more intense.   Did you receive a nerve block?   - A nerve block can provide pain relief for one hour to two days after your surgery. As long as the nerve block is working, you will experience little or no sensation in the area the surgeon operated on.  - As the nerve block wears off, you will begin to experience pain or discomfort. It is very important that you begin taking your prescribed pain medication before the nerve block fully wears off. Treating your pain at the first sign of the block wearing off will ensure your pain is better controlled and more tolerable when full-sensation returns. Do not wait until the pain is intolerable, as the medicine will be less effective. It is better to treat pain in advance than to try and catch up.   General Anesthesia:  If you did not receive a nerve block during your surgery, you will need to start taking your pain medication shortly after your surgery and  should continue to do so as prescribed by your surgeon.     ICE AND ELEVATION: - You may use ice for the first 48-72 hours, but it is not critical.   - Motion of your fingers is very important to decrease the swelling.  - Elevation, as much as possible for the next 48 hours, is critical for decreasing swelling as well as for pain relief. Elevation means when you are seated or lying down, you hand should be at or above your heart. When walking, the hand needs to be at or above the level of your elbow.  - If the bandage gets too tight, it may need to be loosened. Please contact our office and we will instruct you in how to do this.    SURGICAL BANDAGES:  - Keep your dressing and/or splint clean and dry at all times.  You can remove your dressing 4 days from now and change with a dry dressing or Band-Aids as needed thereafter. - You may place a plastic bag over your bandage to shower, but be careful, do not get your bandages wet.  - After the bandages have been removed, it is OK to get the stitches wet in a shower or with hand washing. Do Not soak or submerge the wound yet. Please do not use lotions or creams on the stitches.      HAND THERAPY:  - You may not need any. If you do,   we will begin this at your follow up visit in the clinic.    ACTIVITY AND WORK: - You are encouraged to move any fingers which are not in the bandage.  - Light use of the fingers is allowed to assist the other hand with daily hygiene and eating, but strong gripping or lifting is often uncomfortable and should be avoided.  - You might miss a variable period of time from work and hopefully this issue has been discussed prior to surgery. You may not do any heavy work with your affected hand for about 2 weeks.    [facility] [address] [place],  [place]  [address] [phone]   Post Anesthesia Home Care Instructions  Activity: Get plenty of rest for the remainder of the day. A responsible  individual must stay with you for 24 hours following the procedure.  For the next 24 hours, DO NOT: -Drive a car -Operate machinery -Drink alcoholic beverages -Take any medication unless instructed by your physician -Make any legal decisions or sign important papers.  Meals: Start with liquid foods such as gelatin or soup. Progress to regular foods as tolerated. Avoid greasy, spicy, heavy foods. If nausea and/or vomiting occur, drink only clear liquids until the nausea and/or vomiting subsides. Call your physician if vomiting continues.  Special Instructions/Symptoms: Your throat may feel dry or sore from the anesthesia or the breathing tube placed in your throat during surgery. If this causes discomfort, gargle with warm salt water. The discomfort should disappear within 24 hours.  If you had a scopolamine patch placed behind your ear for the management of post- operative nausea and/or vomiting:  1. The medication in the patch is effective for 72 hours, after which it should be removed.  Wrap patch in a tissue and discard in the trash. Wash hands thoroughly with soap and water. 2. You may remove the patch earlier than 72 hours if you experience unpleasant side effects which may include dry mouth, dizziness or visual disturbances. 3. Avoid touching the patch. Wash your hands with soap and water after contact with the patch.     

## 2021-10-05 NOTE — Progress Notes (Addendum)
Dr. Kalman Shan made aware of blood pressure trends in PACU. Patient alert, oriented, and denies headache, dizziness. ?Per Dr. Kalman Shan, ok for patient to discharge home. ?Patient instructed to take home medications and follow-up with cardiologist as patient mentioned. ?

## 2021-10-05 NOTE — Anesthesia Preprocedure Evaluation (Addendum)
Anesthesia Evaluation  ?Patient identified by MRN, date of birth, ID band ?Patient awake ? ? ? ?Reviewed: ?Allergy & Precautions, NPO status , Patient's Chart, lab work & pertinent test results ? ?History of Anesthesia Complications ?(+) PONV and history of anesthetic complications ? ?Airway ?Mallampati: II ? ?TM Distance: >3 FB ?Neck ROM: Full ? ? ? Dental ?no notable dental hx. ? ?  ?Pulmonary ?neg pulmonary ROS,  ?  ?Pulmonary exam normal ?breath sounds clear to auscultation ? ? ? ? ? ? Cardiovascular ?hypertension, Pt. on medications ?Normal cardiovascular exam ?Rhythm:Regular Rate:Normal ? ? ?  ?Neuro/Psych ?negative neurological ROS ? negative psych ROS  ? GI/Hepatic ?Neg liver ROS, GERD  ,  ?Endo/Other  ?diabetes, Type 2 ? Renal/GU ?Renal InsufficiencyRenal disease  ?negative genitourinary ?  ?Musculoskeletal ?negative musculoskeletal ROS ?(+)  ? Abdominal ?  ?Peds ?negative pediatric ROS ?(+)  Hematology ?negative hematology ROS ?(+)   ?Anesthesia Other Findings ? ? Reproductive/Obstetrics ?negative OB ROS ? ?  ? ? ? ? ? ? ? ? ? ? ? ? ? ?  ?  ? ? ? ? ? ? ? ? ?Anesthesia Physical ?Anesthesia Plan ? ?ASA: 2 ? ?Anesthesia Plan: MAC  ? ?Post-op Pain Management: Minimal or no pain anticipated  ? ?Induction: Intravenous ? ?PONV Risk Score and Plan: 3 and Propofol infusion, Treatment may vary due to age or medical condition and Ondansetron ? ?Airway Management Planned: Simple Face Mask ? ?Additional Equipment:  ? ?Intra-op Plan:  ? ?Post-operative Plan:  ? ?Informed Consent: I have reviewed the patients History and Physical, chart, labs and discussed the procedure including the risks, benefits and alternatives for the proposed anesthesia with the patient or authorized representative who has indicated his/her understanding and acceptance.  ? ? ? ?Dental advisory given ? ?Plan Discussed with: CRNA and Surgeon ? ?Anesthesia Plan Comments:   ? ? ? ? ? ? ?Anesthesia Quick Evaluation ? ?

## 2021-10-06 ENCOUNTER — Encounter (HOSPITAL_BASED_OUTPATIENT_CLINIC_OR_DEPARTMENT_OTHER): Payer: Self-pay | Admitting: Orthopedic Surgery

## 2021-10-17 ENCOUNTER — Ambulatory Visit (INDEPENDENT_AMBULATORY_CARE_PROVIDER_SITE_OTHER): Payer: Medicare Other | Admitting: Orthopedic Surgery

## 2021-10-17 DIAGNOSIS — M65342 Trigger finger, left ring finger: Secondary | ICD-10-CM

## 2021-10-17 NOTE — Progress Notes (Signed)
? ?Post-Op Visit Note ?  ?Patient: Kelly Soto           ?Date of Birth: Jul 05, 1955           ?MRN: [id number] ?Visit Date: 10/17/2021 ?PCP: [name], MD ? ? ?Assessment & Plan: ? ?Chief Complaint:  ?Chief Complaint  ?Patient presents with  ? Left Ring Finger - Routine Post Op  ?  Feels good, not straight but it don't hurt  ? ?Visit Diagnoses:  ?1. Trigger finger, left ring finger   ? ? ?Plan: Patient is two weeks s/p left ring finger trigger release.  She is doing well today.  No triggering, locking, or catching.  Her incision is well healed without any surrounding erythema or induration.  Sutures removed.  We discussed the utility of scar massage.  I can see her again as needed.  ? ?Follow-Up Instructions: No follow-ups on file.  ? ?Orders:  ?No orders of the defined types were placed in this encounter. ? ?No orders of the defined types were placed in this encounter. ? ? ?Imaging: ?No results found. ? ?PMFS History: ?Patient Active Problem List  ? Diagnosis Date Noted  ? Trigger finger, left ring finger 06/10/2021  ? 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 Chronic renal insufficiency 09/24/2013  ? Anemia of renal disease 09/24/2013  ? H/O gastric bypass 08/22/2013  ? Intestinal malabsorption 08/22/2013  ? Anemia, iron deficiency 08/22/2013  ? Chronic eustachian tube dysfunction 03/28/2011  ? Diabetes mellitus ([place]) 03/28/2011  ? ?Past Medical History:  ?Diagnosis Date  ? Anemia of renal disease 09/24/2013  ? Anxiety   ? Arthritis   ? Chronic renal insufficiency 09/24/2013  ? Diabetes mellitus without complication ([place])   ? Type II  ? Gastroparesis   ? GERD (gastroesophageal reflux disease)   ? Headache   ? migrains  ? History of hiatal hernia   ? Hypertension   ? PONV (postoperative nausea and vomiting)   ? Sleep apnea   ? Has not used the Cpap since having gastric bypass surgery.  ? Ulcer   ?  ?Family History  ?Problem Relation Age of Onset  ? Atrial fibrillation Mother   ? Heart attack  Father   ? Sudden Cardiac Death Father   ? Stroke Maternal Grandmother   ?  ?Past Surgical History:  ?Procedure Laterality Date  ? ABDOMINAL HYSTERECTOMY  1992  ? BREAST BIOPSY Left 03/26/2017  ? benign  ? CARDIAC CATHETERIZATION    ? 60yr ago- negative  ? CARPAL TUNNEL RELEASE Bilateral   ? CHOLECYSTECTOMY N/A 05/11/2016  ? Procedure: LAPAROSCOPIC CHOLECYSTECTOMY;  Surgeon: TErroll Luna MD;  [facility] [facility]  Service: General;  Laterality: N/A;  ? EYE SURGERY    ? Bilateral cataracts  ? GASTRIC BYPASS  2009  ? PARTIAL KNEE ARTHROPLASTY Right 05/04/2020  ? Procedure: right partial knee replacement;  Surgeon: DMeredith Pel MD;  [facility] [facility]  Service: Orthopedics;  Laterality: Right;  ? TRIGGER FINGER RELEASE Left 10/05/2021  ? Procedure: LEFT RING FINGER RELEASE TRIGGER FINGER/A-1 PULLEY;  Surgeon: BSherilyn Cooter MD;  [place] [facility]  Service: Orthopedics;  Laterality: Left;  ? ?Social History  ? ?Occupational History  ? Not on file  ?Tobacco Use  ? Smoking status: Never  ? Smokeless tobacco: Never  ? Tobacco comments:  ?  never used tobacco  ?Vaping Use  ? Vaping Use: Never used  ?Substance and Sexual Activity  ? Alcohol use: No  ?  Alcohol/week: 0.0 standard drinks  ? Drug use: No  ? Sexual activity: Yes  ?  Birth control/protection: Surgical  ? ? ? ?

## 2021-10-19 ENCOUNTER — Encounter: Payer: Self-pay | Admitting: *Deleted

## 2021-10-19 ENCOUNTER — Other Ambulatory Visit: Payer: Self-pay | Admitting: *Deleted

## 2021-10-19 NOTE — Patient Outreach (Signed)
?[facility]) Care Management ?Telephonic RN Care Manager Note ? ? ?10/19/2021 ?Name:  Kelly Soto MRN:  [id number] DOB:  04/15/1956 ? ?Summary: ?Pt reports ranges from 70-180 however some readings have exceeded 200. Pt aware of the importance of reducing this reading and may consider a specialist but pt exercises with a health diet and adherent with all her medications. ? ?Recommendations/Changes made from today's visit: ?Will encouraged adherence to the discussed plan of care. Will also alert pt that she has access to [place] and social worker if additional services are needed. ? ?Subjective: ?Kelly Soto is an 66 y.o. year old female who is a primary patient of [name], MD. The care management team was consulted for assistance with care management and/or care coordination needs.   ? ?Telephonic RN Care Manager completed Telephone Visit today. ? ?Objective:  ? ?Medications Reviewed Today   ? ? Reviewed by Tobi Bastos, RN (Registered Nurse) on 10/19/21 at 1533  Med List Status: <None>  ? ?Medication Order Taking? Sig Documenting Provider Last Dose Status Informant  ?Ascorbic Acid (VITAMIN C) 1000 MG tablet [id number] Yes Take 1,000 mg by mouth daily. [provider] Taking Active Self  ?baclofen (LIORESAL) 10 MG tablet [id number] Yes 1 tablet with food or milk as needed [provider] Taking Active   ?BD PEN NEEDLE NANO U/F 32G X 4 MM MISC [id number] No USE 1 ONCE DAILY WITH TRESIBA  ?Patient not taking: Reported on 10/19/2021  ? [provider] Not Taking Active Self  ?betamethasone valerate (VALISONE) 0.1 % cream [id number]  Apply 1 application topically daily as needed (irritation). [provider]  Active Self  ?Blood Glucose Monitoring Suppl (CONTOUR NEXT EZ) w/Device KIT [id number] No USE TO CHECK BLOOD SUGARS DAILY  ?Patient not taking: Reported on 10/19/2021  ? [provider] Not Taking Active Self  ?carvedilol (COREG) 12.5  MG tablet [id number] Yes Take 12.5 mg by mouth in the morning and at bedtime. [provider] Taking Active   ?chlorthalidone (HYGROTON) 25 MG tablet [id number] Yes Take 25 mg by mouth daily. [provider] Taking Active   ?CONTOUR NEXT TEST test strip [id number] No USE STRIP TO CHECK GLUCOSE ONCE DAILY  ?Patient not taking: Reported on 10/19/2021  ? [provider] Not Taking Active Self  ?denosumab (PROLIA) 60 MG/ML SOLN injection [id number] Yes Inject 60 mg into the skin every 6 (six) months. Administer in upper arm, thigh, or abdomen  ?Next injection is due 05-2016 [provider] Taking Active Self  ?         ?Med Note [name], [name]   Thu Jun 29, 2016 10:56 AM)    ?diazepam (VALIUM) 5 MG tablet [id number] Yes Take 5 mg by mouth daily as needed for anxiety.  [provider] Taking Active Self  ?         ?Med Note Delorise Shiner   Thu May 04, 2016  9:06 AM)    ?fluocinonide (LIDEX) 0.05 % external solution [id number] Yes Apply 1 application. topically 2 (two) times daily. [provider] Taking Active   ?fluticasone (FLONASE) 50 MCG/ACT nasal spray [id number] Yes Place 2 sprays into both nostrils at bedtime. [provider] Taking Active Self  ?Galcanezumab-gnlm [facility]) 120 MG/ML SOAJ [id number] Yes Inject 120 mg into the skin every 30 (thirty) days. [provider] Taking Active Self  ?hydroxypropyl methylcellulose / hypromellose (ISOPTO TEARS / GONIOVISC) 2.5 % ophthalmic solution [id number] Yes Place 1  drop into both eyes 3 (three) times daily as needed for dry eyes. [provider] Taking Active Self  ?metFORMIN (GLUCOPHAGE) 500 MG tablet [id number] Yes Take 1,000 mg by mouth 2 (two) times daily with a meal. [provider] Taking Active   ?metroNIDAZOLE (METROGEL) 0.75 % gel [id number] Yes Apply 1 application. topically 2 (two) times daily. [provider] Taking Active   ?Microlet Lancets MISC [id number] No USE AS  DIRECTED TO CHECK BLOOD SUGAR  ?Patient not taking: Reported on 10/19/2021  ? [provider] Not Taking Active Self  ?pantoprazole (PROTONIX) 40 MG tablet [id number] Yes Take 40 mg by mouth daily. [provider] Taking Active   ?pioglitazone (ACTOS) 45 MG tablet [id number] Yes Take 45 mg by mouth daily. [provider] Taking Active Self  ?Simethicone (GAS FREE EXTRA STRENGTH PO) [id number] Yes Take 2 tablets by mouth at bedtime. [provider] Taking Active Self  ?sodium chloride (OCEAN) 0.65 % nasal spray [id number] Yes 2 sprays [provider] Taking Active   ?sucralfate (CARAFATE) 1 g tablet [id number] Yes Take 1 g by mouth 2 (two) times daily. [provider] Taking Active   ?tobramycin (TOBREX) 0.3 % ophthalmic solution [id number] Yes Place 1 drop into both eyes See admin instructions. Instill one drop into the affected eye four times a day prior and the day after eye injection. [provider] Taking Active Self  ?         ?Med Note Nat Christen   Wed Apr 21, 2020  2:24 PM) Injections are for both eyes but may not always be given at the same time.  ?TRESIBA FLEXTOUCH 100 UNIT/ML SOPN FlexTouch Pen [id number] Yes Inject 12 Units into the skin daily.  [provider] Taking Active Self  ?triamcinolone (KENALOG) 0.025 % cream [id number] Yes Apply 1 application. topically 2 (two) times daily. [provider] Taking Active   ?triamcinolone cream (KENALOG) 0.1 % [id number]  Apply 1 application. topically 2 (two) times daily. [provider]  Active   ? ?  ?  ? ?  ? ? ? ?SDOH:  (Social Determinants of Health) assessments and interventions performed:  ? ? ? ?Care Plan ? ?Review of patient past medical history, allergies, medications, health status, including review of consultants reports, laboratory and other test data, was performed as part of comprehensive evaluation for care management services.  ? ?Care Plan : RN Care Manager  plan of care  ?Updates made by Tobi Bastos, RN since 10/19/2021 12:00 AM  ?  ? ?Problem: Knowledge deficit related to Diabetes   ?Priority: High  ?  ? ?Long-Range Goal: Development plan of care for management of Diabetes   ?Start Date: 09/16/2021  ?Expected End Date: 03/24/2022  ?This Visit's Progress: On track  ?Priority: High  ?Note:   ?Current Barriers:  ?Knowledge Deficits related to plan of care for management of DMII  ? ?RNCM Clinical Goal(s):  ?Patient will verbalize understanding of plan for management of DMII as evidenced by self report and chart review ?verbalize basic understanding of  DMII disease process and self health management plan as evidenced by self reporting ?take all medications exactly as prescribed and will call provider for medication related questions as evidenced by self reporting and chart review  through collaboration with RN Care manager, provider, and care team.  ? ?Interventions: ?Inter-disciplinary care team collaboration (see longitudinal plan of care) ?Evaluation of current treatment plan related to  self management and patient's adherence to plan  as established by provider ? ? ?Diabetes Interventions:  (Status:  Goal on track:  Yes.) Long Term Goal ?Assessed patient's understanding of A1c goal: <6.5% ?Provided education to patient about basic DM disease process ?Counseled on importance of regular laboratory monitoring as prescribed ?Discussed plans with patient for ongoing care management follow up and provided patient with direct contact information for care management team ?Provided patient with written educational materials related to hypo and hyperglycemia and importance of correct treatment ?Reviewed scheduled/upcoming provider appointments including: self reporting for outside the Epic notification ?Advised patient, providing education and rationale, to check cbg 3-4 times daily and record, calling with any related symptoms for findings outside established  parameters ?Referral made to pharmacy team for assistance with several costly medications for medication assistance ?Screening for signs and symptoms of depression related to chronic disease state  ?Lab Results  ?Co

## 2021-11-09 ENCOUNTER — Inpatient Hospital Stay: Payer: Medicare Other

## 2021-11-09 ENCOUNTER — Inpatient Hospital Stay (HOSPITAL_BASED_OUTPATIENT_CLINIC_OR_DEPARTMENT_OTHER): Payer: Medicare Other | Admitting: Hematology & Oncology

## 2021-11-09 ENCOUNTER — Inpatient Hospital Stay: Payer: Medicare Other | Attending: Hematology & Oncology

## 2021-11-09 ENCOUNTER — Other Ambulatory Visit: Payer: Self-pay

## 2021-11-09 ENCOUNTER — Encounter: Payer: Self-pay | Admitting: Hematology & Oncology

## 2021-11-09 VITALS — BP 210/92 | HR 66 | Temp 97.9°F | Resp 18 | Ht 60.0 in | Wt 148.0 lb

## 2021-11-09 DIAGNOSIS — D500 Iron deficiency anemia secondary to blood loss (chronic): Secondary | ICD-10-CM | POA: Diagnosis not present

## 2021-11-09 DIAGNOSIS — E119 Type 2 diabetes mellitus without complications: Secondary | ICD-10-CM | POA: Insufficient documentation

## 2021-11-09 DIAGNOSIS — D509 Iron deficiency anemia, unspecified: Secondary | ICD-10-CM | POA: Diagnosis not present

## 2021-11-09 DIAGNOSIS — Z79899 Other long term (current) drug therapy: Secondary | ICD-10-CM | POA: Insufficient documentation

## 2021-11-09 DIAGNOSIS — D631 Anemia in chronic kidney disease: Secondary | ICD-10-CM | POA: Diagnosis not present

## 2021-11-09 DIAGNOSIS — N189 Chronic kidney disease, unspecified: Secondary | ICD-10-CM

## 2021-11-09 DIAGNOSIS — N1831 Chronic kidney disease, stage 3a: Secondary | ICD-10-CM

## 2021-11-09 LAB — IRON AND IRON BINDING CAPACITY (CC-WL,HP ONLY)
Iron: 119 ug/dL (ref 28–170)
Saturation Ratios: 39 % — ABNORMAL HIGH (ref 10.4–31.8)
TIBC: 302 ug/dL (ref 250–450)
UIBC: 183 ug/dL (ref 148–442)

## 2021-11-09 LAB — RETICULOCYTES
Immature Retic Fract: 5.2 % (ref 2.3–15.9)
RBC.: 4.23 MIL/uL (ref 3.87–5.11)
Retic Count, Absolute: 27.9 10*3/uL (ref 19.0–186.0)
Retic Ct Pct: 0.7 % (ref 0.4–3.1)

## 2021-11-09 LAB — CBC WITH DIFFERENTIAL (CANCER CENTER ONLY)
Abs Immature Granulocytes: 0.1 10*3/uL — ABNORMAL HIGH (ref 0.00–0.07)
Basophils Absolute: 0.1 10*3/uL (ref 0.0–0.1)
Basophils Relative: 1 %
Eosinophils Absolute: 0.1 10*3/uL (ref 0.0–0.5)
Eosinophils Relative: 1 %
HCT: 40.5 % (ref 36.0–46.0)
Hemoglobin: 12.4 g/dL (ref 12.0–15.0)
Immature Granulocytes: 1 %
Lymphocytes Relative: 18 %
Lymphs Abs: 1.7 10*3/uL (ref 0.7–4.0)
MCH: 28.8 pg (ref 26.0–34.0)
MCHC: 30.6 g/dL (ref 30.0–36.0)
MCV: 94.2 fL (ref 80.0–100.0)
Monocytes Absolute: 0.6 10*3/uL (ref 0.1–1.0)
Monocytes Relative: 6 %
Neutro Abs: 7 10*3/uL (ref 1.7–7.7)
Neutrophils Relative %: 73 %
Platelet Count: 212 10*3/uL (ref 150–400)
RBC: 4.3 MIL/uL (ref 3.87–5.11)
RDW: 14.8 % (ref 11.5–15.5)
WBC Count: 9.5 10*3/uL (ref 4.0–10.5)
nRBC: 0 % (ref 0.0–0.2)

## 2021-11-09 LAB — CMP (CANCER CENTER ONLY)
ALT: 11 U/L (ref 0–44)
AST: 14 U/L — ABNORMAL LOW (ref 15–41)
Albumin: 4.2 g/dL (ref 3.5–5.0)
Alkaline Phosphatase: 40 U/L (ref 38–126)
Anion gap: 8 (ref 5–15)
BUN: 37 mg/dL — ABNORMAL HIGH (ref 8–23)
CO2: 29 mmol/L (ref 22–32)
Calcium: 10 mg/dL (ref 8.9–10.3)
Chloride: 103 mmol/L (ref 98–111)
Creatinine: 1.27 mg/dL — ABNORMAL HIGH (ref 0.44–1.00)
GFR, Estimated: 46 mL/min — ABNORMAL LOW (ref >60)
Glucose, Bld: 187 mg/dL — ABNORMAL HIGH (ref 70–99)
Potassium: 5.7 mmol/L — ABNORMAL HIGH (ref 3.5–5.1)
Sodium: 140 mmol/L (ref 135–145)
Total Bilirubin: 0.3 mg/dL (ref 0.3–1.2)
Total Protein: 7.2 g/dL (ref 6.5–8.1)

## 2021-11-09 LAB — FERRITIN: Ferritin: 441 ng/mL — ABNORMAL HIGH (ref 11–307)

## 2021-11-09 NOTE — Progress Notes (Signed)
Hematology and Oncology Follow Up Visit ? ?Kelly Soto ?[id number] ?1955/09/17 65 y.o. ?11/09/2021 ? ? 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 ?Current Therapy:   ?IV iron as indicated -- Feraheme on 05/2020 ?Aranesp 300 mcg subcutaneous as needed for hemoglobin less than 11 - ?    ?Interim History:  Ms.  Prothero is back for follow-up.  She is doing quite well.  She had a wonderful Mother's Day.  She and her husband are going to go down to [facility] for their anniversary.  I am sure that they will have a wonderful time down there. ? ?She had surgery for her trigger finger.  This went well. ? ?She has had no problems with fatigue or weakness.  Her blood sugars still have been a problem for her. ? ?She has had no problems with nausea or vomiting.  She has had no diarrhea.  She has had no rashes.  There has been no leg swelling. ? ?Her last iron studies so far, she is doing pretty well.  She is going to have hand surgery for a trigger finger next week.  I do not see any problems with her having this. ? ?Her diabetes is still a little bit on the higher side.  We will have to see what her blood sugars are today. ? ?Her iron studies back in April showed a ferritin of 602 with an iron saturation of 30%.   ? ?Overall, I would say performance status is probably ECOG 1.      ? ? Medications:  ?Current Outpatient Medications:  ?  Ascorbic Acid (VITAMIN C) 1000 MG tablet, Take 1,000 mg by mouth daily., Disp: , Rfl:  ?  baclofen (LIORESAL) 10 MG tablet, 1 tablet with food or milk as needed, Disp: , Rfl:  ?  BD PEN NEEDLE NANO U/F 32G X 4 MM MISC, USE 1 ONCE DAILY WITH TRESIBA (Patient not taking: Reported on 10/19/2021), Disp: , Rfl:  ?  betamethasone valerate (VALISONE) 0.1 % cream, Apply 1 application topically daily as needed (irritation)., Disp: , Rfl:  ?  Blood Glucose Monitoring Suppl (CONTOUR NEXT EZ)  w/Device KIT, USE TO CHECK BLOOD SUGARS DAILY (Patient not taking: Reported on 10/19/2021), Disp: , Rfl:  ?  carvedilol (COREG) 12.5 MG tablet, Take 12.5 mg by mouth in the morning and at bedtime., Disp: , Rfl:  ?  chlorthalidone (HYGROTON) 25 MG tablet, Take 25 mg by mouth daily., Disp: , Rfl:  ?  CONTOUR NEXT TEST test strip, USE STRIP TO CHECK GLUCOSE ONCE DAILY (Patient not taking: Reported on 10/19/2021), Disp: , Rfl:  ?  denosumab (PROLIA) 60 MG/ML SOLN injection, Inject 60 mg into the skin every 6 (six) months. Administer in upper arm, thigh, or abdomen  Next injection is due 05-2016, Disp: , Rfl:  ?  diazepam (VALIUM) 5 MG tablet, Take 5 mg by mouth daily as needed for anxiety. , Disp: , Rfl: 0 ?  fluocinonide (LIDEX) 0.05 % external solution, Apply 1 application. topically 2 (two) times daily., Disp: , Rfl:  ?  fluticasone (FLONASE) 50 MCG/ACT nasal spray, Place 2 sprays into both nostrils at bedtime., Disp: , Rfl:  ?  Galcanezumab-gnlm (EMGALITY) 120 MG/ML SOAJ, Inject 120 mg into the skin every 30 (thirty) days., Disp: , Rfl:  ?  hydroxypropyl methylcellulose / hypromellose (ISOPTO TEARS / GONIOVISC) 2.5 % ophthalmic solution, Place 1 drop into both eyes 3 (  three) times daily as needed for dry eyes., Disp: , Rfl:  ?  metFORMIN (GLUCOPHAGE) 500 MG tablet, Take 1,000 mg by mouth 2 (two) times daily with a meal., Disp: , Rfl:  ?  metroNIDAZOLE (METROGEL) 0.75 % gel, Apply 1 application. topically 2 (two) times daily., Disp: , Rfl:  ?  Microlet Lancets MISC, USE AS DIRECTED TO CHECK BLOOD SUGAR (Patient not taking: Reported on 10/19/2021), Disp: , Rfl:  ?  pantoprazole (PROTONIX) 40 MG tablet, Take 40 mg by mouth daily., Disp: , Rfl:  ?  pioglitazone (ACTOS) 45 MG tablet, Take 45 mg by mouth daily., Disp: , Rfl:  ?  Simethicone (GAS FREE EXTRA STRENGTH PO), Take 2 tablets by mouth at bedtime., Disp: , Rfl:  ?  sodium chloride (OCEAN) 0.65 % nasal spray, 2 sprays, Disp: , Rfl:  ?  sucralfate (CARAFATE) 1 g  tablet, Take 1 g by mouth 2 (two) times daily., Disp: , Rfl:  ?  tobramycin (TOBREX) 0.3 % ophthalmic solution, Place 1 drop into both eyes See admin instructions. Instill one drop into the affected eye four times a day prior and the day after eye injection., Disp: , Rfl:  ?  TRESIBA FLEXTOUCH 100 UNIT/ML SOPN FlexTouch Pen, Inject 12 Units into the skin daily. , Disp: , Rfl:  ?  triamcinolone (KENALOG) 0.025 % cream, Apply 1 application. topically 2 (two) times daily., Disp: , Rfl:  ? ?Allergies:  ?Allergies  ?Allergen Reactions  ? Ace Inhibitors Other (See Comments) and Anaphylaxis  ?  Angioedema  ? Aspirin Other (See Comments)  ?  Cannot take due to gastric bypass surgery  ? Nsaids Anaphylaxis and Other (See Comments)  ?  Cannot take orally due to s/p gastric bypass  ? Sular [Nisoldipine Er] Other (See Comments)  ?  angioedema  ? Nisoldipine Swelling and Other (See Comments)  ? Other Other (See Comments)  ? Boniva [Ibandronic Acid] Other (See Comments)  ?  Joint pain  ? ? ?Past Medical History, Surgical history, Social history, and Family History were reviewed and updated. ? ?Review of Systems: ?Review of Systems  ?Constitutional: Negative.   ?HENT: Negative.    ?Eyes: Negative.   ?Respiratory: Negative.    ?Cardiovascular: Negative.   ?Gastrointestinal: Negative.   ?Genitourinary: Negative.   ?Musculoskeletal: Negative.   ?Skin: Negative.   ?Neurological: Negative.   ?Endo/Heme/Allergies: Negative.   ?Psychiatric/Behavioral: Negative.    ? ?Physical Exam: ? vitals were not taken for this visit.  ? ?Physical Exam ?Vitals reviewed.  ?HENT:  ?   Head: Normocephalic and atraumatic.  ?Eyes:  ?   Pupils: Pupils are equal, round, and reactive to light.  ?Cardiovascular:  ?   Rate and Rhythm: Normal rate and regular rhythm.  ?   Heart sounds: Normal heart sounds.  ?Pulmonary:  ?   Effort: Pulmonary effort is normal.  ?   Breath sounds: Normal breath sounds.  ?Abdominal:  ?   General: Bowel sounds are normal.  ?    Palpations: Abdomen is soft.  ?Musculoskeletal:     ?   General: No tenderness or deformity. Normal range of motion.  ?   Cervical back: Normal range of motion.  ?Lymphadenopathy:  ?   Cervical: No cervical adenopathy.  ?Skin: ?   General: Skin is warm and dry.  ?   Findings: No erythema or rash.  ?Neurological:  ?   Mental Status: She is alert and oriented to person, place, and time.  ?Psychiatric:     ?  Behavior: Behavior normal.     ?   Thought Content: Thought content normal.     ?   Judgment: Judgment normal.  ? ? ? ?Lab Results  ?Component Value Date  ? WBC 9.5 11/09/2021  ? HGB 12.4 11/09/2021  ? HCT 40.5 11/09/2021  ? MCV 94.2 11/09/2021  ? PLT 212 11/09/2021  ? ?  Chemistry   ?   ?Component Value Date/Time  ? NA 138 09/30/2021 0801  ? NA 145 05/10/2017 0748  ? NA 137 12/06/2016 0746  ? K 5.3 (H) 09/30/2021 0801  ? K 4.7 05/10/2017 0748  ? K 5.2 (H) 12/06/2016 0746  ? CL 104 09/30/2021 0801  ? CL 110 (H) 05/10/2017 0748  ? CO2 27 09/30/2021 0801  ? CO2 23 05/10/2017 0748  ? CO2 23 12/06/2016 0746  ? BUN 26 (H) 09/30/2021 0801  ? BUN 28 (H) 05/10/2017 0748  ? BUN 26.1 (H) 12/06/2016 0746  ? CREATININE 1.22 (H) 09/30/2021 0801  ? CREATININE 1.2 05/10/2017 0748  ? CREATININE 1.2 (H) 12/06/2016 0746  ?    ?Component Value Date/Time  ? CALCIUM 9.6 09/30/2021 0801  ? CALCIUM 9.1 05/10/2017 0748  ? CALCIUM 9.1 12/06/2016 0746  ? ALKPHOS 42 09/30/2021 0801  ? ALKPHOS 61 05/10/2017 0748  ? ALKPHOS 41 12/06/2016 0746  ? AST 15 09/30/2021 0801  ? AST 23 12/06/2016 0746  ? ALT 14 09/30/2021 0801  ? ALT 31 05/10/2017 0748  ? ALT 26 12/06/2016 0746  ? BILITOT 0.3 09/30/2021 0801  ? BILITOT 0.26 12/06/2016 0746  ?  ? ? ? ?Impression and Plan: ?Ms. Heffern is 66 year old white female. She has diabetes. She has iron deficiency anemia. She has a very low erythropoietin level. ? ?Thankfully, she does not need any Aranesp today.  I am sure her iron studies should be okay. ? ?She was has hyperkalemia.  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 ? ?Will be interesting to see what her blood sugar is. ? ?I think we now probably move her out to 8 weeks. ? ? ?Volanda Napoleon, MD ?5/17/20239:31 AM ? ?

## 2021-11-15 ENCOUNTER — Other Ambulatory Visit: Payer: Self-pay | Admitting: *Deleted

## 2021-11-15 NOTE — Patient Outreach (Signed)
[facility]) Care Management  11/15/2021  Kelly Soto 15-Dec-1955 [id number]   Telephone Assessment-Re: Call back next week  RN spoke with pt today for update on her ongoing management of care. Pt states she is on vacation and request a call back next week.  Will follow up next week as requested.  Raina Mina, RN Care Management Coordinator [place] Office [phone]

## 2021-11-24 ENCOUNTER — Other Ambulatory Visit: Payer: Self-pay | Admitting: *Deleted

## 2021-11-24 NOTE — Patient Outreach (Signed)
[facility] [facility]) Care Management  11/24/2021  Kelly Soto 04-Aug-1955 [id number]  Telephone Assessment-Unsuccessful  RN attempted outreach call however unsuccessful. RN able to leave a HIPAA approved voice message requesting a call back.  Will will reschedule another outreach call over the next week.  Raina Mina, RN Care Management Coordinator [place] Office [phone]

## 2021-12-01 ENCOUNTER — Other Ambulatory Visit: Payer: Self-pay | Admitting: *Deleted

## 2021-12-01 NOTE — Patient Outreach (Signed)
[facility]) Care Management Telephonic RN Care Manager Note   12/01/2021 Name:  Kelly Soto MRN:  [id number] DOB:  August 24, 1955  Summary: Elevated glucose readings however pt working on changing her dietary habits to improve her ongoing glucose readings. Plan of care discussed with noted updates and pt aware of the discussed plan and what to do for improvement. Also discussed possible endocrinologist if her readings are not improved.  Recommendations/Changes made from today's visit: Noted within the plan of care and pt agreed to comply with Soto better eating habit for improving her ongoing readings.   Subjective: Kelly Soto is an 66 y.o. year old female who is Soto primary patient of [name], MD. The care management team was consulted for assistance with care management and/or care coordination needs.    Telephonic RN Care Manager completed Telephone Visit today.  Objective:   Medications Reviewed Today     Reviewed by Hebert Soho, RN (Registered Nurse) on 11/09/21 at [phone]  Med List Status: <None>   Medication Order Taking? Sig Documenting Provider Last Dose Status Informant  Ascorbic Acid (VITAMIN C) 1000 MG tablet [id number] Yes Take 1,000 mg by mouth daily. [provider] Taking Active Self  baclofen (LIORESAL) 10 MG tablet [id number] Yes 1 tablet with food or milk as needed [provider] Taking Active   BD PEN NEEDLE NANO U/F 32G X 4 MM MISC [id number] Yes  [provider] Taking Active Self  betamethasone valerate (VALISONE) 0.1 % cream [id number] Yes Apply 1 application topically daily as needed (irritation). [provider] Taking Active Self  Blood Glucose Monitoring Suppl (CONTOUR NEXT EZ) w/Device KIT [id number] Yes  [provider] Taking Active Self  carvedilol (COREG) 12.5 MG tablet [id number] Yes Take 12.5 mg by mouth in the morning and at bedtime. [provider] Taking Active    chlorthalidone (HYGROTON) 25 MG tablet [id number] Yes Take 25 mg by mouth daily. [provider] Taking Active   CONTOUR NEXT TEST test strip [id number] Yes  [provider] Taking Active Self  denosumab (PROLIA) 60 MG/ML SOLN injection [id number] Yes Inject 60 mg into the skin every 6 (six) months. Administer in upper arm, thigh, or abdomen  Next injection is due 05-2016 [provider] Taking Active Self           Med Note Kelly Soto, Kelly Soto   Thu Jun 29, 2016 10:56 AM)    diazepam (VALIUM) 5 MG tablet [id number] Yes Take 5 mg by mouth daily as needed for anxiety.  [provider] Taking Active Self           Med Note Kelly Soto, [place] Soto   Thu May 04, 2016  9:06 AM)    fluocinonide (LIDEX) 0.05 % external solution [id number] Yes Apply 1 application. topically 2 (two) times daily. [provider] Taking Active   fluticasone (FLONASE) 50 MCG/ACT nasal spray [id number] Yes Place 2 sprays into both nostrils at bedtime. [provider] Taking Active Self  Galcanezumab-gnlm [facility]) 120 MG/ML Kelly Soto [id number] Yes Inject 120 mg into the skin every 30 (thirty) days. [provider] Taking Active Self  hydroxypropyl methylcellulose / hypromellose (ISOPTO TEARS / GONIOVISC) 2.5 % ophthalmic solution [id number] Yes Place 1 drop into both eyes 3 (three) times daily as needed for dry eyes. [provider] Taking Active Self  metFORMIN (GLUCOPHAGE) 500 MG tablet [id number] Yes Take 1,000 mg by mouth 2 (two) times daily with Soto meal. [provider] Taking  Active   metroNIDAZOLE (METROGEL) 0.75 % gel [id number] Yes Apply 1 application. topically 2 (two) times daily. [provider] Taking Active   Microlet [place] [id number] Yes  [provider] Taking Active Self  pantoprazole (PROTONIX) 40 MG tablet [id number] Yes Take 40 mg by mouth daily. [provider] Taking Active   pioglitazone (ACTOS) 45 MG tablet  [id number] Yes Take 45 mg by mouth daily. [provider] Taking Active Self  Simethicone (GAS FREE EXTRA STRENGTH PO) [id number] Yes Take 2 tablets by mouth at bedtime. [provider] Taking Active Self  sodium chloride (OCEAN) 0.65 % nasal spray [id number] Yes 2 sprays [provider] Taking Active   sucralfate (CARAFATE) 1 g tablet [id number] Yes Take 1 g by mouth 2 (two) times daily. [provider] Taking Active   tobramycin (TOBREX) 0.3 % ophthalmic solution [id number] Yes Place 1 drop into both eyes See admin instructions. Instill one drop into the affected eye four times Soto day prior and the day after eye injection. [provider] Taking Active Self           Med Note Kelly Soto Apr 21, 2020  2:24 PM) Injections are for both eyes but may not always be given at the same time.  TRESIBA FLEXTOUCH 100 UNIT/ML SOPN FlexTouch Pen [id number] Yes Inject 12 Units into the skin daily.  [provider] Taking Active Self  triamcinolone (KENALOG) 0.025 % cream [id number] Yes Apply 1 application. topically 2 (two) times daily. [provider] Taking Active              SDOH:  (Social Determinants of Health) assessments and interventions performed:     Care Plan  Review of patient past medical history, allergies, medications, health status, including review of consultants reports, laboratory and other test data, was performed as part of comprehensive evaluation for care management services.   Care Plan : RN Care Manager plan of care  Updates made by Tobi Bastos, RN since 12/01/2021 12:00 AM     Problem: Knowledge deficit related to Diabetes   Priority: High     Long-Range Goal: Development plan of care for management of Diabetes   Start Date: 09/16/2021  Expected End Date: 03/24/2022  This Visit's Progress: On track  Recent Progress: On track  Priority: High  Note:   Current Barriers:  Knowledge Deficits related  to plan of care for management of DMII   RNCM Clinical Goal(s):  Patient will verbalize understanding of plan for management of DMII as evidenced by self report and chart review verbalize basic understanding of  DMII disease process and self health management plan as evidenced by self reporting take all medications exactly as prescribed and will call provider for medication related questions as evidenced by self reporting and chart review  through collaboration with RN Care manager, provider, and care team.   Interventions: Inter-disciplinary care team collaboration (see longitudinal plan of care) Evaluation of current treatment plan related to  self management and patient's adherence to plan as established by provider   Diabetes Interventions:  (Status:  Goal on track:  Yes.) Long Term Goal Assessed patient's understanding of A1c goal: <6.5% Provided education to patient about basic DM disease process Counseled on importance of regular laboratory monitoring as prescribed Discussed plans with patient for ongoing care management follow up and provided patient with direct contact information for care management team Provided patient with written educational materials related to hypo and hyperglycemia  and importance of correct treatment Reviewed scheduled/upcoming provider appointments including: self reporting for outside the Epic notification Advised patient, providing education and rationale, to check cbg 3-4 times daily and record, calling with any related symptoms for findings outside established parameters Referral made to pharmacy team for assistance with several costly medications for medication assistance Screening for signs and symptoms of depression related to chronic disease state  Lab Results  Component Value Date   HGBA1C 7.1 (H) 04/27/2020  Update 10/19/2021-Pt reports she had recent changes to her medications (updated accordingly). Pt with clear understanding of all medications  and aware of the purpose of all medications. Pt reports her Libre freestyle monitoring for her diabetes averaged 70-180 61% of the time however reports her Soto recent read of 157 and admits she is over 200 on most days. Pt aware she needs to reduce her readings and A1c and will continue seeking Soto possible specialist (Endocrinologist) if no improvement with the recent changes. Pt will continue to work with her provider for improving her readings in additional to her self management of care. Pt aware of the availability of services with TPN for pharmacy and/or social worker if needed.  Plan of care discussed with goals and strongly encouraged adherence for improved results.  Update 12/01/2021: Spoke with pt to today who indicates her CBG are slightly elevated recently AM reads 190 and later in the day at 257 as pt remains asymptomatic. Pt continue to use her Orlene Plum and takes all her prescribed medications with no acute issues or stressors. Pt has admitted to consuming more carbohydrates. RN educated pt accordingly on how carbohydrates can affect her overall glucose levels. Encouraged more protein with small incremented meals through the day 4-6 times Soto day to assist with regulating her glucose levels. Pt reports pending appointment in August to check her A1C. Strongly encouraged pt to change her dietary habits with Soto high protein and low carbohydrate diet. Discussed Soto few days Soto week to start for some improvement on her glucose check and add additional days accordingly. Pt indicates she will adhere to all discussed and will follow up the 2nd week in July concerning her progress with her glucose readings.   Patient Goals/Self-Care Activities: Take all medications as prescribed Attend all scheduled provider appointments Call pharmacy for medication refills 3-7 days in advance of running out of medications Attend church or other social activities Perform all self care activities independently  Perform IADL's  (shopping, preparing meals, housekeeping, managing finances) independently Call provider office for new concerns or questions  schedule appointment with eye doctor check blood sugar at prescribed times: three times daily, 4 times daily, and when you have symptoms of low or high blood sugar check feet daily for cuts, sores or redness enter blood sugar readings and medication or insulin into daily log take the blood sugar log to all doctor visits set goal weight trim toenails straight across drink 6 to 8 glasses of water each day eat fish at least once per week fill half of plate with vegetables keep Soto food diary limit fast food meals to no more than 1 per week manage portion size set Soto realistic goal switch to low-fat or skim milk switch to sugar-free drinks keep feet up while sitting wear comfortable, cotton socks wear comfortable, well-fitting shoes  Follow Up Plan:  Telephone follow up appointment with care management team member scheduled for:  July 2023 The patient has been provided with contact information for the care management team and has  been advised to call with any health related questions or concerns.       Raina Mina, RN Care Management Coordinator [place] Office [phone]

## 2021-12-14 ENCOUNTER — Encounter: Payer: Self-pay | Admitting: *Deleted

## 2021-12-14 NOTE — Telephone Encounter (Signed)
This encounter was created in error - please disregard.

## 2021-12-22 ENCOUNTER — Other Ambulatory Visit: Payer: Self-pay | Admitting: *Deleted

## 2021-12-22 NOTE — Patient Outreach (Signed)
[facility]) Care Management Telephonic RN Care Manager Note   12/22/2021 Name:  Kelly Soto MRN:  [id number] DOB:  [birth year]  Summary: Case closure with all goals met  Recommendations/Changes made from today's visit: Will notify the provider of case closure.  Subjective: Kelly Soto is an 66 y.o. year old female who is a primary patient of [name], MD. The care management team was consulted for assistance with care management and/or care coordination needs.    Telephonic RN Care Manager completed Telephone Visit today.  Objective:   Medications Reviewed Today     Reviewed by Hebert Soho, RN (Registered Nurse) on 11/09/21 at [phone]  Med List Status: <None>   Medication Order Taking? Sig Documenting Provider Last Dose Status Informant  Ascorbic Acid (VITAMIN C) 1000 MG tablet [id number] Yes Take 1,000 mg by mouth daily. [provider] Taking Active Self  baclofen (LIORESAL) 10 MG tablet [id number] Yes 1 tablet with food or milk as needed [provider] Taking Active   BD PEN NEEDLE NANO U/F 32G X 4 MM MISC [id number] Yes  [provider] Taking Active Self  betamethasone valerate (VALISONE) 0.1 % cream [id number] Yes Apply 1 application topically daily as needed (irritation). [provider] Taking Active Self  Blood Glucose Monitoring Suppl (CONTOUR NEXT EZ) w/Device KIT [id number] Yes  [provider] Taking Active Self  carvedilol (COREG) 12.5 MG tablet [id number] Yes Take 12.5 mg by mouth in the morning and at bedtime. [provider] Taking Active   chlorthalidone (HYGROTON) 25 MG tablet [id number] Yes Take 25 mg by mouth daily. [provider] Taking Active   CONTOUR NEXT TEST test strip [id number] Yes  [provider] Taking Active Self  denosumab (PROLIA) 60 MG/ML SOLN injection [id number] Yes Inject 60 mg into the skin every 6 (six) months. Administer in upper arm, thigh, or  abdomen  Next injection is due 05-2016 [provider] Taking Active Self           Med Note [name], [name]   Thu Jun 29, 2016 10:56 AM)    diazepam (VALIUM) 5 MG tablet [id number] Yes Take 5 mg by mouth daily as needed for anxiety.  [provider] Taking Active Self           Med Note [name], [place] A   Thu May 04, 2016  9:06 AM)    fluocinonide (LIDEX) 0.05 % external solution [id number] Yes Apply 1 application. topically 2 (two) times daily. [provider] Taking Active   fluticasone (FLONASE) 50 MCG/ACT nasal spray [id number] Yes Place 2 sprays into both nostrils at bedtime. [provider] Taking Active Self  Galcanezumab-gnlm [facility]) 120 MG/ML [name] [id number] Yes Inject 120 mg into the skin every 30 (thirty) days. [provider] Taking Active Self  hydroxypropyl methylcellulose / hypromellose (ISOPTO TEARS / GONIOVISC) 2.5 % ophthalmic solution [id number] Yes Place 1 drop into both eyes 3 (three) times daily as needed for dry eyes. [provider] Taking Active Self  metFORMIN (GLUCOPHAGE) 500 MG tablet [id number] Yes Take 1,000 mg by mouth 2 (two) times daily with a meal. [provider] Taking Active   metroNIDAZOLE (METROGEL) 0.75 % gel [id number] Yes Apply 1 application. topically 2 (two) times daily. [provider] Taking Active   Microlet [place] [id number] Yes  [provider] Taking Active Self  pantoprazole (PROTONIX) 40 MG tablet [id number] Yes Take 40 mg by mouth daily. [provider] Taking Active  pioglitazone (ACTOS) 45 MG tablet [id number] Yes Take 45 mg by mouth daily. [provider] Taking Active Self  Simethicone (GAS FREE EXTRA STRENGTH PO) [id number] Yes Take 2 tablets by mouth at bedtime. [provider] Taking Active Self  sodium chloride (OCEAN) 0.65 % nasal spray [id number] Yes 2 sprays [provider] Taking Active   sucralfate (CARAFATE) 1 g  tablet [id number] Yes Take 1 g by mouth 2 (two) times daily. [provider] Taking Active   tobramycin (TOBREX) 0.3 % ophthalmic solution [id number] Yes Place 1 drop into both eyes See admin instructions. Instill one drop into the affected eye four times a day prior and the day after eye injection. [provider] Taking Active Self           Med Note [name] Apr 21, 2020  2:24 PM) Injections are for both eyes but may not always be given at the same time.  TRESIBA FLEXTOUCH 100 UNIT/ML SOPN FlexTouch Pen [id number] Yes Inject 12 Units into the skin daily.  [provider] Taking Active Self  triamcinolone (KENALOG) 0.025 % cream [id number] Yes Apply 1 application. topically 2 (two) times daily. [provider] Taking Active              SDOH:  (Social Determinants of Health) assessments and interventions performed:     Care Plan  Review of patient past medical history, allergies, medications, health status, including review of consultants reports, laboratory and other test data, was performed as part of comprehensive evaluation for care management services.   Care Plan : RN Care Manager plan of care  Updates made by Tobi Bastos, RN since 12/22/2021 12:00 AM     Problem: Knowledge deficit related to Diabetes   Priority: High     Long-Range Goal: Development plan of care for management of Diabetes Completed 12/22/2021  Start Date: 09/16/2021  Expected End Date: 03/24/2022  This Visit's Progress: On track  Recent Progress: On track  Priority: High  Note:   Current Barriers:  Knowledge Deficits related to plan of care for management of DMII   RNCM Clinical Goal(s):  Patient will verbalize understanding of plan for management of DMII as evidenced by self report and chart review verbalize basic understanding of  DMII disease process and self health management plan as evidenced by self reporting take all medications exactly as  prescribed and will call provider for medication related questions as evidenced by self reporting and chart review  through collaboration with RN Care manager, provider, and care team.   Interventions: Inter-disciplinary care team collaboration (see longitudinal plan of care) Evaluation of current treatment plan related to  self management and patient's adherence to plan as established by provider   Diabetes Interventions:  (Status:  Goal on track:  Yes.) Long Term Goal Assessed patient's understanding of A1c goal: <6.5% Provided education to patient about basic DM disease process Counseled on importance of regular laboratory monitoring as prescribed Discussed plans with patient for ongoing care management follow up and provided patient with direct contact information for care management team Provided patient with written educational materials related to hypo and hyperglycemia and importance of correct treatment Reviewed scheduled/upcoming provider appointments including: self reporting for outside the Epic notification Advised patient, providing education and rationale, to check cbg 3-4 times daily and record, calling with any related symptoms for findings outside established parameters Referral made to pharmacy team for assistance with several costly medications for medication assistance Screening for  signs and symptoms of depression related to chronic disease state  Lab Results  Component Value Date   HGBA1C 7.1 (H) 04/27/2020  Update 10/19/2021-Pt reports she had recent changes to her medications (updated accordingly). Pt with clear understanding of all medications and aware of the purpose of all medications. Pt reports her Libre freestyle monitoring for her diabetes averaged 70-180 61% of the time however reports her a recent read of 157 and admits she is over 200 on most days. Pt aware she needs to reduce her readings and A1c and will continue seeking a possible specialist (Endocrinologist)  if no improvement with the recent changes. Pt will continue to work with her provider for improving her readings in additional to her self management of care. Pt aware of the availability of services with TPN for pharmacy and/or social worker if needed.  Plan of care discussed with goals and strongly encouraged adherence for improved results.  Update 12/01/2021: Spoke with pt to today who indicates her CBG are slightly elevated recently AM reads 190 and later in the day at 257 as pt remains asymptomatic. Pt continue to use her Orlene Plum and takes all her prescribed medications with no acute issues or stressors. Pt has admitted to consuming more carbohydrates. RN educated pt accordingly on how carbohydrates can affect her overall glucose levels. Encouraged more protein with small incremented meals through the day 4-6 times a day to assist with regulating her glucose levels. Pt reports pending appointment in August to check her A1C. Strongly encouraged pt to change her dietary habits with a high protein and low carbohydrate diet. Discussed a few days a week to start for some improvement on her glucose check and add additional days accordingly. Pt indicates she will adhere to all discussed and will follow up the 2nd week in July concerning her progress with her glucose readings.   Patient Goals/Self-Care Activities: Take all medications as prescribed Attend all scheduled provider appointments Call pharmacy for medication refills 3-7 days in advance of running out of medications Attend church or other social activities Perform all self care activities independently  Perform IADL's (shopping, preparing meals, housekeeping, managing finances) independently Call provider office for new concerns or questions  schedule appointment with eye doctor check blood sugar at prescribed times: three times daily, 4 times daily, and when you have symptoms of low or high blood sugar check feet daily for cuts, sores or  redness enter blood sugar readings and medication or insulin into daily log take the blood sugar log to all doctor visits set goal weight trim toenails straight across drink 6 to 8 glasses of water each day eat fish at least once per week fill half of plate with vegetables keep a food diary limit fast food meals to no more than 1 per week manage portion size set a realistic goal switch to low-fat or skim milk switch to sugar-free drinks keep feet up while sitting wear comfortable, cotton socks wear comfortable, well-fitting shoes  Follow Up Plan:  Telephone follow up appointment with care management team member scheduled for:  July 2023 The patient has been provided with contact information for the care management team and has been advised to call with any health related questions or concerns.    6/29 Pt has met all goals with no additional needs. CBG reduced dow to 120-160 pending A1C. Pt managing her diabetes with additional case management needs at this time.     Raina Mina, RN Care Management Coordinator [place]  Office [phone]

## 2022-01-03 ENCOUNTER — Ambulatory Visit: Payer: Medicare Other | Admitting: *Deleted

## 2022-01-10 ENCOUNTER — Inpatient Hospital Stay: Payer: Medicare Other | Attending: Hematology & Oncology

## 2022-01-10 ENCOUNTER — Inpatient Hospital Stay: Payer: Medicare Other

## 2022-01-10 ENCOUNTER — Encounter: Payer: Self-pay | Admitting: Hematology & Oncology

## 2022-01-10 ENCOUNTER — Inpatient Hospital Stay (HOSPITAL_BASED_OUTPATIENT_CLINIC_OR_DEPARTMENT_OTHER): Payer: Medicare Other | Admitting: Hematology & Oncology

## 2022-01-10 ENCOUNTER — Other Ambulatory Visit: Payer: Self-pay

## 2022-01-10 ENCOUNTER — Encounter: Payer: Self-pay | Admitting: *Deleted

## 2022-01-10 VITALS — BP 181/78 | HR 62 | Temp 97.8°F | Resp 17 | Wt 147.0 lb

## 2022-01-10 DIAGNOSIS — N1832 Chronic kidney disease, stage 3b: Secondary | ICD-10-CM | POA: Diagnosis not present

## 2022-01-10 DIAGNOSIS — D631 Anemia in chronic kidney disease: Secondary | ICD-10-CM | POA: Insufficient documentation

## 2022-01-10 DIAGNOSIS — N189 Chronic kidney disease, unspecified: Secondary | ICD-10-CM | POA: Diagnosis not present

## 2022-01-10 DIAGNOSIS — N1831 Chronic kidney disease, stage 3a: Secondary | ICD-10-CM

## 2022-01-10 DIAGNOSIS — N183 Chronic kidney disease, stage 3 unspecified: Secondary | ICD-10-CM | POA: Insufficient documentation

## 2022-01-10 DIAGNOSIS — D500 Iron deficiency anemia secondary to blood loss (chronic): Secondary | ICD-10-CM

## 2022-01-10 LAB — CMP (CANCER CENTER ONLY)
ALT: 12 U/L (ref 0–44)
AST: 13 U/L — ABNORMAL LOW (ref 15–41)
Albumin: 4 g/dL (ref 3.5–5.0)
Alkaline Phosphatase: 35 U/L — ABNORMAL LOW (ref 38–126)
Anion gap: 8 (ref 5–15)
BUN: 37 mg/dL — ABNORMAL HIGH (ref 8–23)
CO2: 25 mmol/L (ref 22–32)
Calcium: 9.7 mg/dL (ref 8.9–10.3)
Chloride: 104 mmol/L (ref 98–111)
Creatinine: 1.52 mg/dL — ABNORMAL HIGH (ref 0.44–1.00)
GFR, Estimated: 37 mL/min — ABNORMAL LOW (ref >60)
Glucose, Bld: 150 mg/dL — ABNORMAL HIGH (ref 70–99)
Potassium: 5.4 mmol/L — ABNORMAL HIGH (ref 3.5–5.1)
Sodium: 137 mmol/L (ref 135–145)
Total Bilirubin: 0.2 mg/dL — ABNORMAL LOW (ref 0.3–1.2)
Total Protein: 6.5 g/dL (ref 6.5–8.1)

## 2022-01-10 LAB — IRON AND IRON BINDING CAPACITY (CC-WL,HP ONLY)
Iron: 90 ug/dL (ref 28–170)
Saturation Ratios: 33 % — ABNORMAL HIGH (ref 10.4–31.8)
TIBC: 273 ug/dL (ref 250–450)
UIBC: 183 ug/dL (ref 148–442)

## 2022-01-10 LAB — CBC WITH DIFFERENTIAL (CANCER CENTER ONLY)
Abs Immature Granulocytes: 0.1 10*3/uL — ABNORMAL HIGH (ref 0.00–0.07)
Basophils Absolute: 0 10*3/uL (ref 0.0–0.1)
Basophils Relative: 1 %
Eosinophils Absolute: 0.1 10*3/uL (ref 0.0–0.5)
Eosinophils Relative: 2 %
HCT: 31.2 % — ABNORMAL LOW (ref 36.0–46.0)
Hemoglobin: 9.6 g/dL — ABNORMAL LOW (ref 12.0–15.0)
Immature Granulocytes: 1 %
Lymphocytes Relative: 21 %
Lymphs Abs: 1.5 10*3/uL (ref 0.7–4.0)
MCH: 29.3 pg (ref 26.0–34.0)
MCHC: 30.8 g/dL (ref 30.0–36.0)
MCV: 95.1 fL (ref 80.0–100.0)
Monocytes Absolute: 0.6 10*3/uL (ref 0.1–1.0)
Monocytes Relative: 8 %
Neutro Abs: 4.8 10*3/uL (ref 1.7–7.7)
Neutrophils Relative %: 67 %
Platelet Count: 202 10*3/uL (ref 150–400)
RBC: 3.28 MIL/uL — ABNORMAL LOW (ref 3.87–5.11)
RDW: 14.6 % (ref 11.5–15.5)
WBC Count: 7 10*3/uL (ref 4.0–10.5)
nRBC: 0 % (ref 0.0–0.2)

## 2022-01-10 LAB — FERRITIN: Ferritin: 451 ng/mL — ABNORMAL HIGH (ref 11–307)

## 2022-01-10 LAB — RETICULOCYTES
Immature Retic Fract: 7.4 % (ref 2.3–15.9)
RBC.: 3.26 MIL/uL — ABNORMAL LOW (ref 3.87–5.11)
Retic Count, Absolute: 37.2 10*3/uL (ref 19.0–186.0)
Retic Ct Pct: 1.1 % (ref 0.4–3.1)

## 2022-01-10 MED ORDER — DARBEPOETIN ALFA 300 MCG/0.6ML IJ SOSY
300.0000 ug | PREFILLED_SYRINGE | Freq: Once | INTRAMUSCULAR | Status: AC
  Administered 2022-01-10: 300 ug via SUBCUTANEOUS
  Filled 2022-01-10: qty 0.6

## 2022-01-10 NOTE — Patient Instructions (Signed)
Darbepoetin Alfa injection ?What is this medication? ?DARBEPOETIN ALFA (dar be POE e tin  AL fa) helps your body make more red blood cells. It is used to treat anemia caused by chronic kidney failure and chemotherapy. ?This medicine may be used for other purposes; ask your health care provider or pharmacist if you have questions. ?COMMON BRAND NAME(S): Aranesp ?What should I tell my care team before I take this medication? ?They need to know if you have any of these conditions: ?blood clotting disorders or history of blood clots ?cancer patient not on chemotherapy ?cystic fibrosis ?heart disease, such as angina, heart failure, or a history of a heart attack ?hemoglobin level of 12 g/dL or greater ?high blood pressure ?low levels of folate, iron, or vitamin B12 ?seizures ?an unusual or allergic reaction to darbepoetin, erythropoietin, albumin, hamster proteins, latex, other medicines, foods, dyes, or preservatives ?pregnant or trying to get pregnant ?breast-feeding ?How should I use this medication? ?This medicine is for injection into a vein or under the skin. It is usually given by a health care professional in a hospital or clinic setting. ?If you get this medicine at home, you will be taught how to prepare and give this medicine. Use exactly as directed. Take your medicine at regular intervals. Do not take your medicine more often than directed. ?It is important that you put your used needles and syringes in a special sharps container. Do not put them in a trash can. If you do not have a sharps container, call your pharmacist or healthcare provider to get one. ?A special MedGuide will be given to you by the pharmacist with each prescription and refill. Be sure to read this information carefully each time. ?Talk to your pediatrician regarding the use of this medicine in children. While this medicine may be used in children as young as 1 month of age for selected conditions, precautions do apply. ?Overdosage: If  you think you have taken too much of this medicine contact a poison control center or emergency room at once. ?NOTE: This medicine is only for you. Do not share this medicine with others. ?What if I miss a dose? ?If you miss a dose, take it as soon as you can. If it is almost time for your next dose, take only that dose. Do not take double or extra doses. ?What may interact with this medication? ?Do not take this medicine with any of the following medications: ?epoetin alfa ?This list may not describe all possible interactions. Give your health care provider a list of all the medicines, herbs, non-prescription drugs, or dietary supplements you use. Also tell them if you smoke, drink alcohol, or use illegal drugs. Some items may interact with your medicine. ?What should I watch for while using this medication? ?Your condition will be monitored carefully while you are receiving this medicine. ?You may need blood work done while you are taking this medicine. ?This medicine may cause a decrease in vitamin B6. You should make sure that you get enough vitamin B6 while you are taking this medicine. Discuss the foods you eat and the vitamins you take with your health care professional. ?What side effects may I notice from receiving this medication? ?Side effects that you should report to your doctor or health care professional as soon as possible: ?allergic reactions like skin rash, itching or hives, swelling of the face, lips, or tongue ?breathing problems ?changes in vision ?chest pain ?confusion, trouble speaking or understanding ?feeling faint or lightheaded, falls ?high blood   pressure ?muscle aches or pains ?pain, swelling, warmth in the leg ?rapid weight gain ?severe headaches ?sudden numbness or weakness of the face, arm or leg ?trouble walking, dizziness, loss of balance or coordination ?seizures (convulsions) ?swelling of the ankles, feet, hands ?unusually weak or tired ?Side effects that usually do not require  medical attention (report to your doctor or health care professional if they continue or are bothersome): ?diarrhea ?fever, chills (flu-like symptoms) ?headaches ?nausea, vomiting ?redness, stinging, or swelling at site where injected ?This list may not describe all possible side effects. Call your doctor for medical advice about side effects. You may report side effects to FDA at 1-800-FDA-1088. ?Where should I keep my medication? ?Keep out of the reach of children and pets. ?Store in a refrigerator. Do not freeze. Do not shake. Throw away any unused portion if using a single-dose vial. Multi-dose vials can be kept in the refrigerator for up to 21 days after the initial dose. Throw away unused medicine. ?To get rid of medications that are no longer needed or have expired: ?Take the medication to a medication take-back program. Check with your pharmacy or law enforcement to find a location. ?If you cannot return the medication, ask your pharmacist or care team how to get rid of the medication safely. ?NOTE: This sheet is a summary. It may not cover all possible information. If you have questions about this medicine, talk to your doctor, pharmacist, or health care provider. ?? 2023 Elsevier/Gold Standard (2021-06-13 00:00:00) ? ?

## 2022-01-10 NOTE — Progress Notes (Signed)
Hematology and Oncology Follow Up Visit  Kelly Soto [id number] [birth year] 66 y.o. 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 is back for follow-up.  She feels okay.  We last saw her back in May.  At that time, we did not need to give her any Aranesp.  When we last saw her, her ferritin was 441 with an iron saturation of 39%.  She has had no issues with the blood sugars.  She now has a monitor on her arm.  This really has made life a lot easier for her.  There is been no problems with fever.  She has had no cough or shortness of breath.  She has had no nausea or vomiting.  There is been no change in bowel or bladder habits.  She has had no leg swelling.  Overall, I would say performance status is probably ECOG 1.       Medications:  Current Outpatient Medications:    Ascorbic Acid (VITAMIN C) 1000 MG tablet, Take 1,000 mg by mouth daily., Disp: , Rfl:    baclofen (LIORESAL) 10 MG tablet, 1 tablet with food or milk as needed, Disp: , Rfl:    betamethasone valerate (VALISONE) 0.1 % cream, Apply 1 application topically daily as needed (irritation)., Disp: , Rfl:    carvedilol (COREG) 12.5 MG tablet, Take 12.5 mg by mouth in the morning and at bedtime., Disp: , Rfl:    chlorthalidone (HYGROTON) 25 MG tablet, Take 25 mg by mouth daily., Disp: , Rfl:    Continuous Blood Gluc Sensor (FREESTYLE LIBRE 2 SENSOR) MISC, USE AS DIRECTED TO CHECK BLOOD SUGAR FOR 14 DAYS, Disp: , Rfl:    denosumab (PROLIA) 60 MG/ML SOLN injection, Inject 60 mg into the skin every 6 (six) months. Administer in upper arm, thigh, or abdomen  Next injection is due 05-2016, Disp: , Rfl:    diazepam (VALIUM) 5 MG tablet, Take 5 mg by  mouth daily as needed for anxiety. , Disp: , Rfl: 0   fluocinonide (LIDEX) 0.05 % external solution, Apply 1 application. topically 2 (two) times daily., Disp: , Rfl:    fluticasone (FLONASE) 50 MCG/ACT nasal spray, Place 2 sprays into both nostrils at bedtime., Disp: , Rfl:    Galcanezumab-gnlm (EMGALITY) 120 MG/ML SOAJ, Inject 120 mg into the skin every 30 (thirty) days., Disp: , Rfl:    hydroxypropyl methylcellulose / hypromellose (ISOPTO TEARS / GONIOVISC) 2.5 % ophthalmic solution, Place 1 drop into both eyes 3 (three) times daily as needed for dry eyes., Disp: , Rfl:    metFORMIN (GLUCOPHAGE) 500 MG tablet, Take 1,000 mg by mouth 2 (two) times daily with a meal., Disp: , Rfl:    metroNIDAZOLE (METROGEL) 0.75 % gel, Apply 1 application. topically 2 (two) times daily., Disp: , Rfl:    pantoprazole (PROTONIX) 40 MG tablet, Take 40 mg by mouth daily., Disp: , Rfl:    pioglitazone (ACTOS) 45 MG tablet, Take 45 mg by mouth daily., Disp: , Rfl:    Simethicone (GAS FREE EXTRA STRENGTH PO), Take 2 tablets by mouth at bedtime., Disp: , Rfl:    sodium chloride (OCEAN) 0.65 % nasal spray, 2 sprays, Disp: , Rfl:    sucralfate (CARAFATE)  1 g tablet, Take 1 g by mouth 2 (two) times daily., Disp: , Rfl:    tobramycin (TOBREX) 0.3 % ophthalmic solution, Place 1 drop into both eyes See admin instructions. Instill one drop into the affected eye four times a day prior and the day after eye injection., Disp: , Rfl:    TRESIBA FLEXTOUCH 100 UNIT/ML SOPN FlexTouch Pen, Inject 12 Units into the skin daily. , Disp: , Rfl:    triamcinolone (KENALOG) 0.025 % cream, Apply 1 application. topically 2 (two) times daily., Disp: , Rfl:  No current facility-administered medications for this visit.  Facility-Administered Medications Ordered in Other Visits:    Darbepoetin Alfa (ARANESP) injection 300 mcg, 300 mcg, Subcutaneous, Once, Shoua Ressler, Rudell Cobb, MD  Allergies:  Allergies  Allergen Reactions   Ace Inhibitors Other  (See Comments) and Anaphylaxis    Angioedema   Aspirin Other (See Comments)    Cannot take due to gastric bypass surgery   Nsaids Anaphylaxis and Other (See Comments)    Cannot take orally due to s/p gastric bypass   Sular [Nisoldipine Er] Other (See Comments)    angioedema   Nisoldipine Swelling and Other (See Comments)   Other Other (See Comments)   Boniva [Ibandronic Acid] Other (See Comments)    Joint pain    Past Medical History, Surgical history, Social history, and Family History were reviewed and updated.  Review of Systems: Review of Systems  Constitutional: Negative.   HENT: Negative.    Eyes: Negative.   Respiratory: Negative.    Cardiovascular: Negative.   Gastrointestinal: Negative.   Genitourinary: Negative.   Musculoskeletal: Negative.   Skin: Negative.   Neurological: Negative.   Endo/Heme/Allergies: Negative.   Psychiatric/Behavioral: Negative.      Physical Exam:  weight is 147 lb (66.7 kg). Her oral temperature is 97.8 F (36.6 C). Her blood pressure is 181/78 (abnormal) and her pulse is 62. Her respiration is 17 and oxygen saturation is 100%.   Physical Exam Vitals reviewed.  HENT:     Head: Normocephalic and atraumatic.  Eyes:     Pupils: Pupils are equal, round, and reactive to light.  Cardiovascular:     Rate and Rhythm: Normal rate and regular rhythm.     Heart sounds: Normal heart sounds.  Pulmonary:     Effort: Pulmonary effort is normal.     Breath sounds: Normal breath sounds.  Abdominal:     General: Bowel sounds are normal.     Palpations: Abdomen is soft.  Musculoskeletal:        General: No tenderness or deformity. Normal range of motion.     Cervical back: Normal range of motion.  Lymphadenopathy:     Cervical: No cervical adenopathy.  Skin:    General: Skin is warm and dry.     Findings: No erythema or rash.  Neurological:     Mental Status: She is alert and oriented to person, place, and time.  Psychiatric:         Behavior: Behavior normal.        Thought Content: Thought content normal.        Judgment: Judgment normal.      Lab Results  Component Value Date   WBC 7.0 01/10/2022   HGB 9.6 (L) 01/10/2022   HCT 31.2 (L) 01/10/2022   MCV 95.1 01/10/2022   PLT 202 01/10/2022     Chemistry      Component Value Date/Time   NA 140 11/09/2021 0910   NA  145 05/10/2017 0748   NA 137 12/06/2016 0746   K 5.7 (H) 11/09/2021 0910   K 4.7 05/10/2017 0748   K 5.2 (H) 12/06/2016 0746   CL 103 11/09/2021 0910   CL 110 (H) 05/10/2017 0748   CO2 29 11/09/2021 0910   CO2 23 05/10/2017 0748   CO2 23 12/06/2016 0746   BUN 37 (H) 11/09/2021 0910   BUN 28 (H) 05/10/2017 0748   BUN 26.1 (H) 12/06/2016 0746   CREATININE 1.27 (H) 11/09/2021 0910   CREATININE 1.2 05/10/2017 0748   CREATININE 1.2 (H) 12/06/2016 0746      Component Value Date/Time   CALCIUM 10.0 11/09/2021 0910   CALCIUM 9.1 05/10/2017 0748   CALCIUM 9.1 12/06/2016 0746   ALKPHOS 40 11/09/2021 0910   ALKPHOS 61 05/10/2017 0748   ALKPHOS 41 12/06/2016 0746   AST 14 (L) 11/09/2021 0910   AST 23 12/06/2016 0746   ALT 11 11/09/2021 0910   ALT 31 05/10/2017 0748   ALT 26 12/06/2016 0746   BILITOT 0.3 11/09/2021 0910   BILITOT 0.26 12/06/2016 0746       Impression and Plan: Kelly Soto is 66 year old white female. She has diabetes. She has iron deficiency anemia. She has a very low erythropoietin level.  We clearly want to give her Aranesp today.  Is been about 3 months that she has had Aranesp.  She has hyperkalemia.  This is always chronic for her.  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  I told her that she could probably take a multivitamin a day if she wanted to.  I do not think high vitamin D level is can be a problem for her.  We will plan to get her back now in about 6 weeks.  I want to make sure we get her back before Labor Day to make sure her blood count is okay.  Volanda Napoleon, MD 7/18/202310:01  AM

## 2022-02-13 ENCOUNTER — Other Ambulatory Visit: Payer: Self-pay | Admitting: Family Medicine

## 2022-02-13 DIAGNOSIS — Z1231 Encounter for screening mammogram for malignant neoplasm of breast: Secondary | ICD-10-CM

## 2022-02-21 ENCOUNTER — Inpatient Hospital Stay (HOSPITAL_BASED_OUTPATIENT_CLINIC_OR_DEPARTMENT_OTHER): Payer: Medicare Other | Admitting: Hematology & Oncology

## 2022-02-21 ENCOUNTER — Inpatient Hospital Stay: Payer: Medicare Other | Attending: Hematology & Oncology

## 2022-02-21 ENCOUNTER — Inpatient Hospital Stay: Payer: Medicare Other

## 2022-02-21 ENCOUNTER — Encounter: Payer: Self-pay | Admitting: Hematology & Oncology

## 2022-02-21 VITALS — BP 186/77 | HR 66 | Temp 98.2°F | Resp 18 | Ht 61.0 in | Wt 147.5 lb

## 2022-02-21 DIAGNOSIS — Z794 Long term (current) use of insulin: Secondary | ICD-10-CM | POA: Diagnosis not present

## 2022-02-21 DIAGNOSIS — Z9884 Bariatric surgery status: Secondary | ICD-10-CM | POA: Insufficient documentation

## 2022-02-21 DIAGNOSIS — N1831 Chronic kidney disease, stage 3a: Secondary | ICD-10-CM | POA: Diagnosis not present

## 2022-02-21 DIAGNOSIS — D508 Other iron deficiency anemias: Secondary | ICD-10-CM | POA: Insufficient documentation

## 2022-02-21 DIAGNOSIS — Z79899 Other long term (current) drug therapy: Secondary | ICD-10-CM | POA: Diagnosis not present

## 2022-02-21 DIAGNOSIS — E119 Type 2 diabetes mellitus without complications: Secondary | ICD-10-CM | POA: Diagnosis not present

## 2022-02-21 DIAGNOSIS — N183 Chronic kidney disease, stage 3 unspecified: Secondary | ICD-10-CM | POA: Insufficient documentation

## 2022-02-21 DIAGNOSIS — D500 Iron deficiency anemia secondary to blood loss (chronic): Secondary | ICD-10-CM

## 2022-02-21 DIAGNOSIS — K909 Intestinal malabsorption, unspecified: Secondary | ICD-10-CM | POA: Diagnosis not present

## 2022-02-21 DIAGNOSIS — Z7984 Long term (current) use of oral hypoglycemic drugs: Secondary | ICD-10-CM | POA: Diagnosis not present

## 2022-02-21 DIAGNOSIS — D631 Anemia in chronic kidney disease: Secondary | ICD-10-CM | POA: Insufficient documentation

## 2022-02-21 DIAGNOSIS — N1832 Chronic kidney disease, stage 3b: Secondary | ICD-10-CM

## 2022-02-21 LAB — CBC WITH DIFFERENTIAL (CANCER CENTER ONLY)
Abs Immature Granulocytes: 0.03 10*3/uL (ref 0.00–0.07)
Basophils Absolute: 0.1 10*3/uL (ref 0.0–0.1)
Basophils Relative: 1 %
Eosinophils Absolute: 0.1 10*3/uL (ref 0.0–0.5)
Eosinophils Relative: 2 %
HCT: 38 % (ref 36.0–46.0)
Hemoglobin: 11.8 g/dL — ABNORMAL LOW (ref 12.0–15.0)
Immature Granulocytes: 0 %
Lymphocytes Relative: 21 %
Lymphs Abs: 1.7 10*3/uL (ref 0.7–4.0)
MCH: 29.4 pg (ref 26.0–34.0)
MCHC: 31.1 g/dL (ref 30.0–36.0)
MCV: 94.8 fL (ref 80.0–100.0)
Monocytes Absolute: 0.5 10*3/uL (ref 0.1–1.0)
Monocytes Relative: 6 %
Neutro Abs: 5.5 10*3/uL (ref 1.7–7.7)
Neutrophils Relative %: 70 %
Platelet Count: 203 10*3/uL (ref 150–400)
RBC: 4.01 MIL/uL (ref 3.87–5.11)
RDW: 14.8 % (ref 11.5–15.5)
WBC Count: 7.9 10*3/uL (ref 4.0–10.5)
nRBC: 0 % (ref 0.0–0.2)

## 2022-02-21 LAB — IRON AND IRON BINDING CAPACITY (CC-WL,HP ONLY)
Iron: 116 ug/dL (ref 28–170)
Saturation Ratios: 37 % — ABNORMAL HIGH (ref 10.4–31.8)
TIBC: 318 ug/dL (ref 250–450)
UIBC: 202 ug/dL (ref 148–442)

## 2022-02-21 LAB — VITAMIN D 25 HYDROXY (VIT D DEFICIENCY, FRACTURES): Vit D, 25-Hydroxy: 39.93 ng/mL (ref 30–100)

## 2022-02-21 LAB — RETICULOCYTES
Immature Retic Fract: 4.4 % (ref 2.3–15.9)
RBC.: 4.03 MIL/uL (ref 3.87–5.11)
Retic Count, Absolute: 23 10*3/uL (ref 19.0–186.0)
Retic Ct Pct: 0.6 % (ref 0.4–3.1)

## 2022-02-21 LAB — FERRITIN: Ferritin: 366 ng/mL — ABNORMAL HIGH (ref 11–307)

## 2022-02-21 LAB — CMP (CANCER CENTER ONLY)
ALT: 12 U/L (ref 0–44)
AST: 15 U/L (ref 15–41)
Albumin: 4.4 g/dL (ref 3.5–5.0)
Alkaline Phosphatase: 44 U/L (ref 38–126)
Anion gap: 8 (ref 5–15)
BUN: 39 mg/dL — ABNORMAL HIGH (ref 8–23)
CO2: 27 mmol/L (ref 22–32)
Calcium: 9.9 mg/dL (ref 8.9–10.3)
Chloride: 101 mmol/L (ref 98–111)
Creatinine: 1.22 mg/dL — ABNORMAL HIGH (ref 0.44–1.00)
GFR, Estimated: 49 mL/min — ABNORMAL LOW (ref >60)
Glucose, Bld: 169 mg/dL — ABNORMAL HIGH (ref 70–99)
Potassium: 5.2 mmol/L — ABNORMAL HIGH (ref 3.5–5.1)
Sodium: 136 mmol/L (ref 135–145)
Total Bilirubin: 0.3 mg/dL (ref 0.3–1.2)
Total Protein: 7.4 g/dL (ref 6.5–8.1)

## 2022-02-21 NOTE — Progress Notes (Signed)
Hematology and Oncology Follow Up Visit  Kelly Soto [id number] [birth year] 67 y.o. 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 is back for follow-up.  She is doing quite well.  She really has had no complaints since we last saw her.  Back in July, her ferritin was 451 with an iron saturation of 33%.  She has had no bleeding.  There is been no change in bowel or bladder habits.  She has had no cough or shortness of breath.  She does have diabetes.  She is trying to watch her diabetes.  There is no tingling in the hands or feet.  She does have hyperkalemia.  I suspect this is probably a renal tubular acidosis from the diabetes.    I think she and her husband will be going out of town for a week in September.  [place]  Currently, I would say that her performance status is ECOG 1.       Medications:  Current Outpatient Medications:    Ascorbic Acid (VITAMIN C) 1000 MG tablet, Take 1,000 mg by mouth daily., Disp: , Rfl:    baclofen (LIORESAL) 10 MG tablet, 1 tablet with food or milk as needed, Disp: , Rfl:    betamethasone valerate (VALISONE) 0.1 % cream, Apply 1 application topically daily as needed (irritation)., Disp: , Rfl:    carvedilol (COREG) 12.5 MG tablet, Take 12.5 mg by mouth in the morning and at bedtime., Disp: , Rfl:    chlorthalidone (HYGROTON) 25 MG tablet, Take 25 mg by mouth daily., Disp: , Rfl:    Continuous Blood Gluc Sensor (FREESTYLE LIBRE 2 SENSOR) MISC, USE AS DIRECTED TO CHECK BLOOD SUGAR FOR 14 DAYS, Disp: , Rfl:    denosumab (PROLIA) 60 MG/ML SOLN injection, Inject 60 mg into the skin every 6 (six) months. Administer in upper arm, thigh,  or abdomen  Next injection is due 05-2016, Disp: , Rfl:    diazepam (VALIUM) 5 MG tablet, Take 5 mg by mouth daily as needed for anxiety. , Disp: , Rfl: 0   fluocinonide (LIDEX) 0.05 % external solution, Apply 1 application. topically 2 (two) times daily., Disp: , Rfl:    fluticasone (FLONASE) 50 MCG/ACT nasal spray, Place 2 sprays into both nostrils at bedtime., Disp: , Rfl:    Galcanezumab-gnlm (EMGALITY) 120 MG/ML SOAJ, Inject 120 mg into the skin every 30 (thirty) days., Disp: , Rfl:    hydroxypropyl methylcellulose / hypromellose (ISOPTO TEARS / GONIOVISC) 2.5 % ophthalmic solution, Place 1 drop into both eyes 3 (three) times daily as needed for dry eyes., Disp: , Rfl:    lansoprazole (PREVACID) 30 MG capsule, Take 30 mg by mouth 2 (two) times daily., Disp: , Rfl:    metFORMIN (GLUCOPHAGE) 500 MG tablet, Take 1,000 mg by mouth 2 (two) times daily with a meal., Disp: , Rfl:    metroNIDAZOLE (METROGEL) 0.75 % gel, Apply 1 application. topically 2 (two) times daily., Disp: , Rfl:    pioglitazone (ACTOS) 45 MG tablet, Take 45 mg by mouth daily., Disp: , Rfl:    Simethicone (GAS FREE EXTRA STRENGTH PO), Take 2 tablets by mouth at bedtime., Disp: , Rfl:  sodium chloride (OCEAN) 0.65 % nasal spray, 2 sprays, Disp: , Rfl:    tobramycin (TOBREX) 0.3 % ophthalmic solution, Place 1 drop into both eyes See admin instructions. Instill one drop into the affected eye four times a day prior and the day after eye injection., Disp: , Rfl:    TRESIBA FLEXTOUCH 100 UNIT/ML SOPN FlexTouch Pen, Inject 12 Units into the skin daily. , Disp: , Rfl:    triamcinolone (KENALOG) 0.025 % cream, Apply 1 application. topically 2 (two) times daily., Disp: , Rfl:    pantoprazole (PROTONIX) 40 MG tablet, Take 40 mg by mouth daily., Disp: , Rfl:    sucralfate (CARAFATE) 1 g tablet, Take 1 g by mouth 2 (two) times daily., Disp: , Rfl:   Allergies:  Allergies  Allergen Reactions   Ace Inhibitors Other (See Comments) and  Anaphylaxis    Angioedema   Aspirin Other (See Comments)    Cannot take due to gastric bypass surgery   Nsaids Anaphylaxis and Other (See Comments)    Cannot take orally due to s/p gastric bypass   Sular [Nisoldipine Er] Other (See Comments)    angioedema   Nisoldipine Swelling and Other (See Comments)   Other Other (See Comments)   Boniva [Ibandronic Acid] Other (See Comments)    Joint pain    Past Medical History, Surgical history, Social history, and Family History were reviewed and updated.  Review of Systems: Review of Systems  Constitutional: Negative.   HENT: Negative.    Eyes: Negative.   Respiratory: Negative.    Cardiovascular: Negative.   Gastrointestinal: Negative.   Genitourinary: Negative.   Musculoskeletal: Negative.   Skin: Negative.   Neurological: Negative.   Endo/Heme/Allergies: Negative.   Psychiatric/Behavioral: Negative.      Physical Exam:  height is '5\' 1"'$  (1.549 m) and weight is 147 lb 8 oz (66.9 kg). Her oral temperature is 98.2 F (36.8 C). Her blood pressure is 186/77 (abnormal) and her pulse is 66. Her respiration is 18 and oxygen saturation is 100%.   Physical Exam Vitals reviewed.  HENT:     Head: Normocephalic and atraumatic.  Eyes:     Pupils: Pupils are equal, round, and reactive to light.  Cardiovascular:     Rate and Rhythm: Normal rate and regular rhythm.     Heart sounds: Normal heart sounds.  Pulmonary:     Effort: Pulmonary effort is normal.     Breath sounds: Normal breath sounds.  Abdominal:     General: Bowel sounds are normal.     Palpations: Abdomen is soft.  Musculoskeletal:        General: No tenderness or deformity. Normal range of motion.     Cervical back: Normal range of motion.  Lymphadenopathy:     Cervical: No cervical adenopathy.  Skin:    General: Skin is warm and dry.     Findings: No erythema or rash.  Neurological:     Mental Status: She is alert and oriented to person, place, and time.   Psychiatric:        Behavior: Behavior normal.        Thought Content: Thought content normal.        Judgment: Judgment normal.      Lab Results  Component Value Date   WBC 7.9 02/21/2022   HGB 11.8 (L) 02/21/2022   HCT 38.0 02/21/2022   MCV 94.8 02/21/2022   PLT 203 02/21/2022     Chemistry      Component  Value Date/Time   NA 136 02/21/2022 0909   NA 145 05/10/2017 0748   NA 137 12/06/2016 0746   K 5.2 (H) 02/21/2022 0909   K 4.7 05/10/2017 0748   K 5.2 (H) 12/06/2016 0746   CL 101 02/21/2022 0909   CL 110 (H) 05/10/2017 0748   CO2 27 02/21/2022 0909   CO2 23 05/10/2017 0748   CO2 23 12/06/2016 0746   BUN 39 (H) 02/21/2022 0909   BUN 28 (H) 05/10/2017 0748   BUN 26.1 (H) 12/06/2016 0746   CREATININE 1.22 (H) 02/21/2022 0909   CREATININE 1.2 05/10/2017 0748   CREATININE 1.2 (H) 12/06/2016 0746      Component Value Date/Time   CALCIUM 9.9 02/21/2022 0909   CALCIUM 9.1 05/10/2017 0748   CALCIUM 9.1 12/06/2016 0746   ALKPHOS 44 02/21/2022 0909   ALKPHOS 61 05/10/2017 0748   ALKPHOS 41 12/06/2016 0746   AST 15 02/21/2022 0909   AST 23 12/06/2016 0746   ALT 12 02/21/2022 0909   ALT 31 05/10/2017 0748   ALT 26 12/06/2016 0746   BILITOT 0.3 02/21/2022 0909   BILITOT 0.26 12/06/2016 0746       Impression and Plan: Ms. Burandt is 66 year old white female. She has diabetes. She has iron deficiency anemia. She has a very low erythropoietin level.  Her blood count is fantastic.  She was responds well to the Aranesp.  I think we can now get her back in 6 weeks.  We will get her through September.  We will try to time her appointment so that we can get her back before the holidays.  Volanda Napoleon, MD 8/29/202310:29 AM

## 2022-03-06 ENCOUNTER — Ambulatory Visit
Admission: RE | Admit: 2022-03-06 | Discharge: 2022-03-06 | Disposition: A | Discharge status: Home / Self Care | Payer: Medicare Other | Source: Ambulatory Visit | Attending: Family Medicine | Admitting: Family Medicine

## 2022-03-06 DIAGNOSIS — Z1231 Encounter for screening mammogram for malignant neoplasm of breast: Secondary | ICD-10-CM

## 2022-04-04 ENCOUNTER — Inpatient Hospital Stay: Payer: Medicare Other | Attending: Hematology & Oncology

## 2022-04-04 ENCOUNTER — Inpatient Hospital Stay (HOSPITAL_BASED_OUTPATIENT_CLINIC_OR_DEPARTMENT_OTHER): Payer: Medicare Other | Admitting: Medical Oncology

## 2022-04-04 ENCOUNTER — Encounter: Payer: Self-pay | Admitting: Medical Oncology

## 2022-04-04 ENCOUNTER — Inpatient Hospital Stay: Payer: Medicare Other

## 2022-04-04 VITALS — BP 186/71

## 2022-04-04 VITALS — BP 186/85 | HR 67 | Temp 98.2°F | Resp 17 | Wt 149.0 lb

## 2022-04-04 DIAGNOSIS — I10 Essential (primary) hypertension: Secondary | ICD-10-CM | POA: Diagnosis not present

## 2022-04-04 DIAGNOSIS — N1831 Chronic kidney disease, stage 3a: Secondary | ICD-10-CM | POA: Diagnosis not present

## 2022-04-04 DIAGNOSIS — N183 Chronic kidney disease, stage 3 unspecified: Secondary | ICD-10-CM | POA: Diagnosis not present

## 2022-04-04 DIAGNOSIS — D500 Iron deficiency anemia secondary to blood loss (chronic): Secondary | ICD-10-CM | POA: Diagnosis not present

## 2022-04-04 DIAGNOSIS — D631 Anemia in chronic kidney disease: Secondary | ICD-10-CM | POA: Diagnosis present

## 2022-04-04 DIAGNOSIS — N189 Chronic kidney disease, unspecified: Secondary | ICD-10-CM

## 2022-04-04 LAB — CMP (CANCER CENTER ONLY)
ALT: 14 U/L (ref 0–44)
AST: 16 U/L (ref 15–41)
Albumin: 4.2 g/dL (ref 3.5–5.0)
Alkaline Phosphatase: 43 U/L (ref 38–126)
Anion gap: 7 (ref 5–15)
BUN: 30 mg/dL — ABNORMAL HIGH (ref 8–23)
CO2: 26 mmol/L (ref 22–32)
Calcium: 10.1 mg/dL (ref 8.9–10.3)
Chloride: 104 mmol/L (ref 98–111)
Creatinine: 1.32 mg/dL — ABNORMAL HIGH (ref 0.44–1.00)
GFR, Estimated: 44 mL/min — ABNORMAL LOW (ref >60)
Glucose, Bld: 163 mg/dL — ABNORMAL HIGH (ref 70–99)
Potassium: 5 mmol/L (ref 3.5–5.1)
Sodium: 137 mmol/L (ref 135–145)
Total Bilirubin: 0.3 mg/dL (ref 0.3–1.2)
Total Protein: 6.8 g/dL (ref 6.5–8.1)

## 2022-04-04 LAB — RETICULOCYTES
Immature Retic Fract: 6 % (ref 2.3–15.9)
RBC.: 3.5 MIL/uL — ABNORMAL LOW (ref 3.87–5.11)
Retic Count, Absolute: 39.9 10*3/uL (ref 19.0–186.0)
Retic Ct Pct: 1.1 % (ref 0.4–3.1)

## 2022-04-04 LAB — CBC WITH DIFFERENTIAL (CANCER CENTER ONLY)
Abs Immature Granulocytes: 0.12 10*3/uL — ABNORMAL HIGH (ref 0.00–0.07)
Basophils Absolute: 0 10*3/uL (ref 0.0–0.1)
Basophils Relative: 1 %
Eosinophils Absolute: 0.1 10*3/uL (ref 0.0–0.5)
Eosinophils Relative: 1 %
HCT: 33 % — ABNORMAL LOW (ref 36.0–46.0)
Hemoglobin: 10.2 g/dL — ABNORMAL LOW (ref 12.0–15.0)
Immature Granulocytes: 2 %
Lymphocytes Relative: 17 %
Lymphs Abs: 1.4 10*3/uL (ref 0.7–4.0)
MCH: 29.1 pg (ref 26.0–34.0)
MCHC: 30.9 g/dL (ref 30.0–36.0)
MCV: 94.3 fL (ref 80.0–100.0)
Monocytes Absolute: 0.6 10*3/uL (ref 0.1–1.0)
Monocytes Relative: 8 %
Neutro Abs: 6 10*3/uL (ref 1.7–7.7)
Neutrophils Relative %: 71 %
Platelet Count: 195 10*3/uL (ref 150–400)
RBC: 3.5 MIL/uL — ABNORMAL LOW (ref 3.87–5.11)
RDW: 14.1 % (ref 11.5–15.5)
WBC Count: 8.3 10*3/uL (ref 4.0–10.5)
nRBC: 0 % (ref 0.0–0.2)

## 2022-04-04 LAB — FERRITIN: Ferritin: 367 ng/mL — ABNORMAL HIGH (ref 11–307)

## 2022-04-04 LAB — IRON AND IRON BINDING CAPACITY (CC-WL,HP ONLY)
Iron: 72 ug/dL (ref 28–170)
Saturation Ratios: 25 % (ref 10.4–31.8)
TIBC: 288 ug/dL (ref 250–450)
UIBC: 216 ug/dL (ref 148–442)

## 2022-04-04 MED ORDER — DARBEPOETIN ALFA 300 MCG/0.6ML IJ SOSY
300.0000 ug | PREFILLED_SYRINGE | Freq: Once | INTRAMUSCULAR | Status: AC
  Administered 2022-04-04: 300 ug via SUBCUTANEOUS
  Filled 2022-04-04: qty 0.6

## 2022-04-04 NOTE — Progress Notes (Signed)
Ok to give injection per MD despite BP elevated

## 2022-04-04 NOTE — Progress Notes (Signed)
Hematology and Oncology Follow Up Visit  Kelly Soto [id number] 1956/01/12 66 y.o. 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    Today she reports that she is doing really well.  She recently got back from [place] she [place] time.  She does report that she is feeling mildly fatigued so she suspects that she is needing her Aranesp subcu injection today.  She is tolerating this well.  Other than this she is feeling well.  She has known whitecoat hypertension and monitors closely at home and to make sure that home readings are within target range.  She states that coming to the cancer center is one of the only places where her blood pressure goes significantly higher than others.  She is asymptomatic at this time without any chest pain shortness of breath peripheral edema, headache or visual changes.    Medications:  Current Outpatient Medications:    Ascorbic Acid (VITAMIN C) 1000 MG tablet, Take 1,000 mg by mouth daily., Disp: , Rfl:    baclofen (LIORESAL) 10 MG tablet, 1 tablet with food or milk as needed, Disp: , Rfl:    betamethasone valerate (VALISONE) 0.1 % cream, Apply 1 application topically daily as needed (irritation)., Disp: , Rfl:    carvedilol (COREG) 25 MG tablet, Take 25 mg by mouth 2 (two) times daily with a meal., Disp: , Rfl:    chlorthalidone (HYGROTON) 25 MG tablet, Take 25 mg by mouth daily., Disp: , Rfl:    Continuous Blood Gluc Sensor (FREESTYLE LIBRE 2 SENSOR) MISC, USE AS DIRECTED TO CHECK BLOOD SUGAR FOR 14 DAYS, Disp: , Rfl:    denosumab (PROLIA) 60 MG/ML SOLN injection, Inject 60 mg into the skin every 6 (six) months.  Administer in upper arm, thigh, or abdomen  Next injection is due 05-2016, Disp: , Rfl:    diazepam (VALIUM) 5 MG tablet, Take 5 mg by mouth daily as needed for anxiety. , Disp: , Rfl: 0   fluocinonide (LIDEX) 0.05 % external solution, Apply 1 application. topically 2 (two) times daily., Disp: , Rfl:    fluticasone (FLONASE) 50 MCG/ACT nasal spray, Place 2 sprays into both nostrils at bedtime., Disp: , Rfl:    Galcanezumab-gnlm (EMGALITY) 120 MG/ML SOAJ, Inject 120 mg into the skin every 30 (thirty) days., Disp: , Rfl:    hydroxypropyl methylcellulose / hypromellose (ISOPTO TEARS / GONIOVISC) 2.5 % ophthalmic solution, Place 1 drop into both eyes 3 (three) times daily as needed for dry eyes., Disp: , Rfl:    lansoprazole (PREVACID) 30 MG capsule, Take 30 mg by mouth 2 (two) times daily., Disp: , Rfl:    metFORMIN (GLUCOPHAGE) 500 MG tablet, Take 1,000 mg by mouth 2 (two) times daily with a meal., Disp: , Rfl:    metroNIDAZOLE (METROGEL) 0.75 % gel, Apply 1 application. topically 2 (two) times daily., Disp: , Rfl:    pioglitazone (ACTOS) 45 MG tablet, Take 45 mg by mouth daily., Disp: , Rfl:    Simethicone (GAS FREE EXTRA STRENGTH PO), Take 2 tablets by mouth at bedtime., Disp: , Rfl:    sodium chloride (OCEAN) 0.65 % nasal spray, 2 sprays, Disp: ,  Rfl:    tobramycin (TOBREX) 0.3 % ophthalmic solution, Place 1 drop into both eyes See admin instructions. Instill one drop into the affected eye four times a day prior and the day after eye injection., Disp: , Rfl:    TRESIBA FLEXTOUCH 100 UNIT/ML SOPN FlexTouch Pen, Inject 12 Units into the skin daily. , Disp: , Rfl:    triamcinolone (KENALOG) 0.025 % cream, Apply 1 application. topically 2 (two) times daily., Disp: , Rfl:    carvedilol (COREG) 12.5 MG tablet, Take 12.5 mg by mouth in the morning and at bedtime. (Patient not taking: Reported on 04/04/2022), Disp: , Rfl:   Allergies:  Allergies  Allergen Reactions   Ace Inhibitors Other (See Comments)  and Anaphylaxis    Angioedema   Aspirin Other (See Comments)    Cannot take due to gastric bypass surgery   Nsaids Anaphylaxis and Other (See Comments)    Cannot take orally due to s/p gastric bypass   Sular [Nisoldipine Er] Other (See Comments)    angioedema   Nisoldipine Swelling and Other (See Comments)   Other Other (See Comments)   Boniva [Ibandronic Acid] Other (See Comments)    Joint pain    Past Medical History, Surgical history, Social history, and Family History were reviewed and updated.  Review of Systems: Review of Systems  Constitutional: Negative.   HENT: Negative.    Eyes: Negative.   Respiratory: Negative.    Cardiovascular: Negative.   Gastrointestinal: Negative.   Genitourinary: Negative.   Musculoskeletal: Negative.   Skin: Negative.   Neurological: Negative.   Endo/Heme/Allergies: Negative.   Psychiatric/Behavioral: Negative.      Physical Exam:  weight is 149 lb 0.6 oz (67.6 kg). Her oral temperature is 98.2 F (36.8 C). Her blood pressure is 186/85 (abnormal) and her pulse is 67. Her respiration is 17 and oxygen saturation is 100%.   Physical Exam Vitals reviewed.  HENT:     Head: Normocephalic and atraumatic.  Eyes:     Pupils: Pupils are equal, round, and reactive to light.  Cardiovascular:     Rate and Rhythm: Normal rate and regular rhythm.     Heart sounds: Normal heart sounds.  Pulmonary:     Effort: Pulmonary effort is normal.     Breath sounds: Normal breath sounds.  Abdominal:     General: Bowel sounds are normal.     Palpations: Abdomen is soft.  Musculoskeletal:        General: No tenderness or deformity. Normal range of motion.     Cervical back: Normal range of motion.  Lymphadenopathy:     Cervical: No cervical adenopathy.  Skin:    General: Skin is warm and dry.     Findings: No erythema or rash.  Neurological:     Mental Status: She is alert and oriented to person, place, and time.  Psychiatric:        Behavior:  Behavior normal.        Thought Content: Thought content normal.        Judgment: Judgment normal.      Lab Results  Component Value Date   WBC 8.3 04/04/2022   HGB 10.2 (L) 04/04/2022   HCT 33.0 (L) 04/04/2022   MCV 94.3 04/04/2022   PLT 195 04/04/2022     Chemistry      Component Value Date/Time   NA 137 04/04/2022 0913   NA 145 05/10/2017 0748   NA 137 12/06/2016 0746   K 5.0 04/04/2022 0913  K 4.7 05/10/2017 0748   K 5.2 (H) 12/06/2016 0746   CL 104 04/04/2022 0913   CL 110 (H) 05/10/2017 0748   CO2 26 04/04/2022 0913   CO2 23 05/10/2017 0748   CO2 23 12/06/2016 0746   BUN 30 (H) 04/04/2022 0913   BUN 28 (H) 05/10/2017 0748   BUN 26.1 (H) 12/06/2016 0746   CREATININE 1.32 (H) 04/04/2022 0913   CREATININE 1.2 05/10/2017 0748   CREATININE 1.2 (H) 12/06/2016 0746      Component Value Date/Time   CALCIUM 10.1 04/04/2022 0913   CALCIUM 9.1 05/10/2017 0748   CALCIUM 9.1 12/06/2016 0746   ALKPHOS 43 04/04/2022 0913   ALKPHOS 61 05/10/2017 0748   ALKPHOS 41 12/06/2016 0746   AST 16 04/04/2022 0913   AST 23 12/06/2016 0746   ALT 14 04/04/2022 0913   ALT 31 05/10/2017 0748   ALT 26 12/06/2016 0746   BILITOT 0.3 04/04/2022 0913   BILITOT 0.26 12/06/2016 0746      Encounter Diagnoses  Name Primary?   Chronic renal impairment, stage 3a (HCC) Yes   Iron deficiency anemia due to chronic blood loss    White coat syndrome with diagnosis of hypertension     Impression and Plan: Kelly Soto is 66 year old white female. She has diabetes. She has iron deficiency anemia. She has a very low erythropoietin level.  Today our plan is to give her her Aranesp injection which she does well with.  RTC in 6 to 8 weeks for labs and recheck with consideration of Aranesp.   She will continue to monitor her blood pressure closely at home.  She may want to discuss with her PCP or cardiologist regarding a as needed medication when she comes the cancer.   Hughie Closs,  PA-C 10/10/202311:06 AM

## 2022-04-04 NOTE — Patient Instructions (Signed)

## 2022-05-17 ENCOUNTER — Other Ambulatory Visit: Payer: Self-pay | Admitting: *Deleted

## 2022-05-17 DIAGNOSIS — N189 Chronic kidney disease, unspecified: Secondary | ICD-10-CM

## 2022-05-22 ENCOUNTER — Inpatient Hospital Stay (HOSPITAL_BASED_OUTPATIENT_CLINIC_OR_DEPARTMENT_OTHER): Payer: Medicare Other | Admitting: Hematology & Oncology

## 2022-05-22 ENCOUNTER — Other Ambulatory Visit: Payer: Self-pay

## 2022-05-22 ENCOUNTER — Encounter: Payer: Self-pay | Admitting: *Deleted

## 2022-05-22 ENCOUNTER — Inpatient Hospital Stay: Payer: Medicare Other

## 2022-05-22 ENCOUNTER — Inpatient Hospital Stay: Payer: Medicare Other | Attending: Hematology & Oncology

## 2022-05-22 ENCOUNTER — Encounter: Payer: Self-pay | Admitting: Hematology & Oncology

## 2022-05-22 VITALS — BP 170/88 | HR 68 | Temp 98.3°F | Resp 18 | Ht 61.0 in | Wt 147.0 lb

## 2022-05-22 DIAGNOSIS — D631 Anemia in chronic kidney disease: Secondary | ICD-10-CM | POA: Diagnosis present

## 2022-05-22 DIAGNOSIS — N189 Chronic kidney disease, unspecified: Secondary | ICD-10-CM

## 2022-05-22 DIAGNOSIS — Z79899 Other long term (current) drug therapy: Secondary | ICD-10-CM | POA: Diagnosis not present

## 2022-05-22 DIAGNOSIS — D509 Iron deficiency anemia, unspecified: Secondary | ICD-10-CM | POA: Insufficient documentation

## 2022-05-22 DIAGNOSIS — N183 Chronic kidney disease, stage 3 unspecified: Secondary | ICD-10-CM | POA: Diagnosis present

## 2022-05-22 DIAGNOSIS — E1122 Type 2 diabetes mellitus with diabetic chronic kidney disease: Secondary | ICD-10-CM | POA: Diagnosis not present

## 2022-05-22 DIAGNOSIS — N1831 Chronic kidney disease, stage 3a: Secondary | ICD-10-CM | POA: Diagnosis not present

## 2022-05-22 LAB — RETICULOCYTES
Immature Retic Fract: 3.7 % (ref 2.3–15.9)
RBC.: 4.17 MIL/uL (ref 3.87–5.11)
Retic Count, Absolute: 23.4 10*3/uL (ref 19.0–186.0)
Retic Ct Pct: 0.6 % (ref 0.4–3.1)

## 2022-05-22 LAB — COMPREHENSIVE METABOLIC PANEL
ALT: 16 U/L (ref 0–44)
AST: 16 U/L (ref 15–41)
Albumin: 4.3 g/dL (ref 3.5–5.0)
Alkaline Phosphatase: 43 U/L (ref 38–126)
Anion gap: 8 (ref 5–15)
BUN: 39 mg/dL — ABNORMAL HIGH (ref 8–23)
CO2: 27 mmol/L (ref 22–32)
Calcium: 10.1 mg/dL (ref 8.9–10.3)
Chloride: 101 mmol/L (ref 98–111)
Creatinine, Ser: 1.42 mg/dL — ABNORMAL HIGH (ref 0.44–1.00)
GFR, Estimated: 41 mL/min — ABNORMAL LOW (ref >60)
Glucose, Bld: 253 mg/dL — ABNORMAL HIGH (ref 70–99)
Potassium: 5.2 mmol/L — ABNORMAL HIGH (ref 3.5–5.1)
Sodium: 136 mmol/L (ref 135–145)
Total Bilirubin: 0.3 mg/dL (ref 0.3–1.2)
Total Protein: 6.9 g/dL (ref 6.5–8.1)

## 2022-05-22 LAB — IRON AND IRON BINDING CAPACITY (CC-WL,HP ONLY)
Iron: 155 ug/dL (ref 28–170)
Saturation Ratios: 50 % — ABNORMAL HIGH (ref 10.4–31.8)
TIBC: 311 ug/dL (ref 250–450)
UIBC: 156 ug/dL (ref 148–442)

## 2022-05-22 LAB — CBC WITH DIFFERENTIAL (CANCER CENTER ONLY)
Abs Immature Granulocytes: 0.09 10*3/uL — ABNORMAL HIGH (ref 0.00–0.07)
Basophils Absolute: 0 10*3/uL (ref 0.0–0.1)
Basophils Relative: 1 %
Eosinophils Absolute: 0.1 10*3/uL (ref 0.0–0.5)
Eosinophils Relative: 2 %
HCT: 38 % (ref 36.0–46.0)
Hemoglobin: 11.9 g/dL — ABNORMAL LOW (ref 12.0–15.0)
Immature Granulocytes: 1 %
Lymphocytes Relative: 22 %
Lymphs Abs: 1.6 10*3/uL (ref 0.7–4.0)
MCH: 28.7 pg (ref 26.0–34.0)
MCHC: 31.3 g/dL (ref 30.0–36.0)
MCV: 91.6 fL (ref 80.0–100.0)
Monocytes Absolute: 0.5 10*3/uL (ref 0.1–1.0)
Monocytes Relative: 8 %
Neutro Abs: 4.7 10*3/uL (ref 1.7–7.7)
Neutrophils Relative %: 66 %
Platelet Count: 193 10*3/uL (ref 150–400)
RBC: 4.15 MIL/uL (ref 3.87–5.11)
RDW: 15.3 % (ref 11.5–15.5)
WBC Count: 7.1 10*3/uL (ref 4.0–10.5)
nRBC: 0 % (ref 0.0–0.2)

## 2022-05-22 LAB — FERRITIN: Ferritin: 424 ng/mL — ABNORMAL HIGH (ref 11–307)

## 2022-05-22 NOTE — Progress Notes (Signed)
Hematology and Oncology Follow Up Visit  Kelly Soto [id number] February 27, 1956 66 y.o. 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 is back for follow-up.  She had a wonderful Thanksgiving with her family.  She is looking forward to a nice Christmas.  Her blood sugars have a lot to be desired.  This morning, her blood sugar was 165 at home.  Then went up to 232.  In the office today was 253.  And she had not even eaten anything yet.  She has had no problems with bleeding.  She has had no change in bowel or bladder habits.  She is now on [place].  Her renal function is up a little bit.  She has had no cough or shortness of breath.  She has had no rashes.  There has been no leg swelling.  She has no neuropathy.  She has chronic hyperkalemia.  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  Her last iron studies back in October showed a ferritin of 367 with an iron saturation of 25%.  Overall, I would say that her performance status is probably ECOG 1.   Medications:  Current Outpatient Medications:    dapagliflozin propanediol (FARXIGA) 5 MG TABS tablet, 5 mg daily., Disp: , Rfl:    Ascorbic Acid (VITAMIN C) 1000 MG tablet, Take 1,000 mg by mouth daily., Disp: , Rfl:    baclofen (LIORESAL) 10 MG tablet, 1 tablet with food or milk as needed, Disp: , Rfl:    betamethasone valerate (VALISONE) 0.1 % cream, Apply 1 application topically daily as needed (irritation)., Disp: , Rfl:    carvedilol (COREG) 25 MG tablet, Take 25 mg by mouth 2 (two) times daily with a meal., Disp: , Rfl:    chlorthalidone (HYGROTON) 25 MG tablet, Take 25 mg by mouth daily., Disp: , Rfl:    Continuous  Blood Gluc Sensor (FREESTYLE LIBRE 2 SENSOR) MISC, USE AS DIRECTED TO CHECK BLOOD SUGAR FOR 14 DAYS, Disp: , Rfl:    denosumab (PROLIA) 60 MG/ML SOLN injection, Inject 60 mg into the skin every 6 (six) months. Administer in upper arm, thigh, or abdomen  Next injection is due 05-2016, Disp: , Rfl:    diazepam (VALIUM) 5 MG tablet, Take 5 mg by mouth daily as needed for anxiety. , Disp: , Rfl: 0   fluocinonide (LIDEX) 0.05 % external solution, Apply 1 application. topically 2 (two) times daily., Disp: , Rfl:    fluticasone (FLONASE) 50 MCG/ACT nasal spray, Place 2 sprays into both nostrils at bedtime., Disp: , Rfl:    Galcanezumab-gnlm (EMGALITY) 120 MG/ML SOAJ, Inject 120 mg into the skin every 30 (thirty) days., Disp: , Rfl:    hydroxypropyl methylcellulose / hypromellose (ISOPTO TEARS / GONIOVISC) 2.5 % ophthalmic solution, Place 1 drop into both eyes 3 (three) times daily as needed for dry eyes., Disp: , Rfl:    lansoprazole (PREVACID) 30 MG capsule, Take 30 mg by mouth 2 (two) times daily., Disp: , Rfl:    metFORMIN (GLUCOPHAGE) 500 MG tablet, Take 1,000 mg by mouth 2 (two) times daily with a meal., Disp: , Rfl:    metroNIDAZOLE (METROGEL) 0.75 % gel, Apply 1 application. topically 2 (  two) times daily., Disp: , Rfl:    pioglitazone (ACTOS) 45 MG tablet, Take 45 mg by mouth daily., Disp: , Rfl:    Simethicone (GAS FREE EXTRA STRENGTH PO), Take 2 tablets by mouth at bedtime., Disp: , Rfl:    sodium chloride (OCEAN) 0.65 % nasal spray, 2 sprays, Disp: , Rfl:    tobramycin (TOBREX) 0.3 % ophthalmic solution, Place 1 drop into both eyes See admin instructions. Instill one drop into the affected eye four times a day prior and the day after eye injection., Disp: , Rfl:    TRESIBA FLEXTOUCH 100 UNIT/ML SOPN FlexTouch Pen, Inject 10 Units into the skin daily., Disp: , Rfl:    triamcinolone (KENALOG) 0.025 % cream, Apply 1 application. topically 2 (two) times daily., Disp: , Rfl:   Allergies:  Allergies   Allergen Reactions   Ace Inhibitors Other (See Comments) and Anaphylaxis    Angioedema   Aspirin Other (See Comments)    Cannot take due to gastric bypass surgery   Nsaids Anaphylaxis and Other (See Comments)    Cannot take orally due to s/p gastric bypass   Sular [Nisoldipine Er] Other (See Comments)    angioedema   Nisoldipine Swelling and Other (See Comments)   Other Other (See Comments)   Boniva [Ibandronic Acid] Other (See Comments)    Joint pain    Past Medical History, Surgical history, Social history, and Family History were reviewed and updated.  Review of Systems: Review of Systems  Constitutional: Negative.   HENT: Negative.    Eyes: Negative.   Respiratory: Negative.    Cardiovascular: Negative.   Gastrointestinal: Negative.   Genitourinary: Negative.   Musculoskeletal: Negative.   Skin: Negative.   Neurological: Negative.   Endo/Heme/Allergies: Negative.   Psychiatric/Behavioral: Negative.      Physical Exam:  height is '5\' 1"'$  (1.549 m) and weight is 147 lb (66.7 kg). Her oral temperature is 98.3 F (36.8 C). Her blood pressure is 170/88 (abnormal) and her pulse is 68. Her respiration is 18 and oxygen saturation is 99%.   Physical Exam Vitals reviewed.  HENT:     Head: Normocephalic and atraumatic.  Eyes:     Pupils: Pupils are equal, round, and reactive to light.  Cardiovascular:     Rate and Rhythm: Normal rate and regular rhythm.     Heart sounds: Normal heart sounds.  Pulmonary:     Effort: Pulmonary effort is normal.     Breath sounds: Normal breath sounds.  Abdominal:     General: Bowel sounds are normal.     Palpations: Abdomen is soft.  Musculoskeletal:        General: No tenderness or deformity. Normal range of motion.     Cervical back: Normal range of motion.  Lymphadenopathy:     Cervical: No cervical adenopathy.  Skin:    General: Skin is warm and dry.     Findings: No erythema or rash.  Neurological:     Mental Status: She is  alert and oriented to person, place, and time.  Psychiatric:        Behavior: Behavior normal.        Thought Content: Thought content normal.        Judgment: Judgment normal.      Lab Results  Component Value Date   WBC 7.1 05/22/2022   HGB 11.9 (L) 05/22/2022   HCT 38.0 05/22/2022   MCV 91.6 05/22/2022   PLT 193 05/22/2022     Chemistry  Component Value Date/Time   NA 136 05/22/2022 0837   NA 145 05/10/2017 0748   NA 137 12/06/2016 0746   K 5.2 (H) 05/22/2022 0837   K 4.7 05/10/2017 0748   K 5.2 (H) 12/06/2016 0746   CL 101 05/22/2022 0837   CL 110 (H) 05/10/2017 0748   CO2 27 05/22/2022 0837   CO2 23 05/10/2017 0748   CO2 23 12/06/2016 0746   BUN 39 (H) 05/22/2022 0837   BUN 28 (H) 05/10/2017 0748   BUN 26.1 (H) 12/06/2016 0746   CREATININE 1.42 (H) 05/22/2022 0837   CREATININE 1.32 (H) 04/04/2022 0913   CREATININE 1.2 05/10/2017 0748   CREATININE 1.2 (H) 12/06/2016 0746      Component Value Date/Time   CALCIUM 10.1 05/22/2022 0837   CALCIUM 9.1 05/10/2017 0748   CALCIUM 9.1 12/06/2016 0746   ALKPHOS 43 05/22/2022 0837   ALKPHOS 61 05/10/2017 0748   ALKPHOS 41 12/06/2016 0746   AST 16 05/22/2022 0837   AST 16 04/04/2022 0913   AST 23 12/06/2016 0746   ALT 16 05/22/2022 0837   ALT 14 04/04/2022 0913   ALT 31 05/10/2017 0748   ALT 26 12/06/2016 0746   BILITOT 0.3 05/22/2022 0837   BILITOT 0.3 04/04/2022 0913   BILITOT 0.26 12/06/2016 0746       Impression and Plan: Kelly Soto is 66 year old white female. She has diabetes. She has iron deficiency anemia. She has a very low erythropoietin level.  Thankfully, we do not have to give her a shot today of Aranesp.  We will see what her iron studies look like.  Her prognosis clearly is tied to her diabetes.  I think she has any complications, he will be secondary to the diabetes.  We will now get her through Christmas.  I will see her back in January 2024. before the holidays.  Volanda Napoleon,  MD 11/27/202310:05 AM

## 2022-06-01 ENCOUNTER — Ambulatory Visit
Admission: RE | Admit: 2022-06-01 | Discharge: 2022-06-01 | Disposition: A | Discharge status: Home / Self Care | Payer: Medicare Other | Source: Ambulatory Visit | Attending: Family Medicine | Admitting: Family Medicine

## 2022-06-01 ENCOUNTER — Other Ambulatory Visit: Payer: Self-pay | Admitting: Family Medicine

## 2022-06-01 DIAGNOSIS — R519 Headache, unspecified: Secondary | ICD-10-CM

## 2022-06-21 ENCOUNTER — Ambulatory Visit: Payer: Medicare Other | Admitting: Orthopaedic Surgery

## 2022-07-03 ENCOUNTER — Inpatient Hospital Stay (HOSPITAL_BASED_OUTPATIENT_CLINIC_OR_DEPARTMENT_OTHER): Payer: Medicare Other | Admitting: Hematology & Oncology

## 2022-07-03 ENCOUNTER — Encounter: Payer: Self-pay | Admitting: Hematology & Oncology

## 2022-07-03 ENCOUNTER — Inpatient Hospital Stay: Payer: Medicare Other | Attending: Hematology & Oncology

## 2022-07-03 ENCOUNTER — Inpatient Hospital Stay: Payer: Medicare Other

## 2022-07-03 ENCOUNTER — Other Ambulatory Visit: Payer: Self-pay

## 2022-07-03 VITALS — BP 159/63 | HR 64 | Temp 98.1°F | Resp 18 | Ht 61.0 in | Wt 147.0 lb

## 2022-07-03 DIAGNOSIS — N1831 Chronic kidney disease, stage 3a: Secondary | ICD-10-CM | POA: Insufficient documentation

## 2022-07-03 DIAGNOSIS — N183 Chronic kidney disease, stage 3 unspecified: Secondary | ICD-10-CM | POA: Insufficient documentation

## 2022-07-03 DIAGNOSIS — D631 Anemia in chronic kidney disease: Secondary | ICD-10-CM

## 2022-07-03 DIAGNOSIS — K9041 Non-celiac gluten sensitivity: Secondary | ICD-10-CM

## 2022-07-03 DIAGNOSIS — N189 Chronic kidney disease, unspecified: Secondary | ICD-10-CM

## 2022-07-03 LAB — CBC WITH DIFFERENTIAL (CANCER CENTER ONLY)
Abs Immature Granulocytes: 0.11 10*3/uL — ABNORMAL HIGH (ref 0.00–0.07)
Basophils Absolute: 0 10*3/uL (ref 0.0–0.1)
Basophils Relative: 1 %
Eosinophils Absolute: 0.1 10*3/uL (ref 0.0–0.5)
Eosinophils Relative: 2 %
HCT: 32.2 % — ABNORMAL LOW (ref 36.0–46.0)
Hemoglobin: 10 g/dL — ABNORMAL LOW (ref 12.0–15.0)
Immature Granulocytes: 2 %
Lymphocytes Relative: 22 %
Lymphs Abs: 1.6 10*3/uL (ref 0.7–4.0)
MCH: 29.4 pg (ref 26.0–34.0)
MCHC: 31.1 g/dL (ref 30.0–36.0)
MCV: 94.7 fL (ref 80.0–100.0)
Monocytes Absolute: 0.5 10*3/uL (ref 0.1–1.0)
Monocytes Relative: 7 %
Neutro Abs: 4.9 10*3/uL (ref 1.7–7.7)
Neutrophils Relative %: 66 %
Platelet Count: 216 10*3/uL (ref 150–400)
RBC: 3.4 MIL/uL — ABNORMAL LOW (ref 3.87–5.11)
RDW: 14.9 % (ref 11.5–15.5)
WBC Count: 7.3 10*3/uL (ref 4.0–10.5)
nRBC: 0 % (ref 0.0–0.2)

## 2022-07-03 LAB — IRON AND IRON BINDING CAPACITY (CC-WL,HP ONLY)
Iron: 85 ug/dL (ref 28–170)
Saturation Ratios: 26 % (ref 10.4–31.8)
TIBC: 322 ug/dL (ref 250–450)
UIBC: 237 ug/dL (ref 148–442)

## 2022-07-03 LAB — FERRITIN: Ferritin: 439 ng/mL — ABNORMAL HIGH (ref 11–307)

## 2022-07-03 LAB — CMP (CANCER CENTER ONLY)
ALT: 15 U/L (ref 0–44)
AST: 16 U/L (ref 15–41)
Albumin: 4.4 g/dL (ref 3.5–5.0)
Alkaline Phosphatase: 45 U/L (ref 38–126)
Anion gap: 10 (ref 5–15)
BUN: 43 mg/dL — ABNORMAL HIGH (ref 8–23)
CO2: 24 mmol/L (ref 22–32)
Calcium: 10 mg/dL (ref 8.9–10.3)
Chloride: 104 mmol/L (ref 98–111)
Creatinine: 1.36 mg/dL — ABNORMAL HIGH (ref 0.44–1.00)
GFR, Estimated: 43 mL/min — ABNORMAL LOW (ref >60)
Glucose, Bld: 174 mg/dL — ABNORMAL HIGH (ref 70–99)
Potassium: 5.9 mmol/L — ABNORMAL HIGH (ref 3.5–5.1)
Sodium: 138 mmol/L (ref 135–145)
Total Bilirubin: 0.3 mg/dL (ref 0.3–1.2)
Total Protein: 7.4 g/dL (ref 6.5–8.1)

## 2022-07-03 LAB — RETICULOCYTES
Immature Retic Fract: 9.4 % (ref 2.3–15.9)
RBC.: 3.36 MIL/uL — ABNORMAL LOW (ref 3.87–5.11)
Retic Count, Absolute: 49.1 10*3/uL (ref 19.0–186.0)
Retic Ct Pct: 1.5 % (ref 0.4–3.1)

## 2022-07-03 MED ORDER — DARBEPOETIN ALFA 300 MCG/0.6ML IJ SOSY
300.0000 ug | PREFILLED_SYRINGE | Freq: Once | INTRAMUSCULAR | Status: AC
  Administered 2022-07-03: 300 ug via SUBCUTANEOUS
  Filled 2022-07-03: qty 0.6

## 2022-07-03 NOTE — Patient Instructions (Signed)

## 2022-07-03 NOTE — Progress Notes (Signed)
Hematology and Oncology Follow Up Visit  Kelly Soto [id number] 1955/09/18 67 y.o. 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 is back for follow-up.  She is busy over the holiday season.  She is thankful that the holidays are over now.  We will go ahead and give her dose of Aranesp today.  When we last saw her back in November, her ferritin was 424 with an iron saturation of 50%.  She had a CT of the head recently.  She had a lot of dizziness.  This may have been from the [place] that she was taken.  Thankfully, the CT of the brain did not show anything that was unusual.    She said that she also had an echocardiogram that was done.  I cannot find that result.  She has had no change in bowel or bladder habits.  She has had no rashes.  There has been no leg swelling.  She has had no problems with COVID or Influenza.  Her blood sugars are on the higher side.  There was have been.  Today, however, the blood sugar is actually pretty good for her.  Currently, I would say her performance status is probably ECOG 1    Medications:  Current Outpatient Medications:    Ascorbic Acid (VITAMIN C) 1000 MG tablet, Take 1,000 mg by mouth daily., Disp: , Rfl:    baclofen (LIORESAL) 10 MG tablet, 1 tablet with food or milk as needed, Disp: , Rfl:    betamethasone valerate (VALISONE) 0.1 % cream, Apply 1 application topically daily as needed (irritation)., Disp: , Rfl:    carvedilol (COREG) 25 MG tablet, Take 25 mg by mouth 2 (two) times daily with a meal., Disp: , Rfl:    chlorthalidone (HYGROTON) 25 MG tablet, Take 25 mg by mouth daily., Disp: , Rfl:    Continuous Blood Gluc Sensor (FREESTYLE LIBRE 2 SENSOR)  MISC, USE AS DIRECTED TO CHECK BLOOD SUGAR FOR 14 DAYS, Disp: , Rfl:    dapagliflozin propanediol (FARXIGA) 5 MG TABS tablet, 5 mg daily., Disp: , Rfl:    denosumab (PROLIA) 60 MG/ML SOLN injection, Inject 60 mg into the skin every 6 (six) months. Administer in upper arm, thigh, or abdomen  Next injection is due 05-2016, Disp: , Rfl:    diazepam (VALIUM) 5 MG tablet, Take 5 mg by mouth daily as needed for anxiety. , Disp: , Rfl: 0   fluocinonide (LIDEX) 0.05 % external solution, Apply 1 application. topically 2 (two) times daily., Disp: , Rfl:    fluticasone (FLONASE) 50 MCG/ACT nasal spray, Place 2 sprays into both nostrils at bedtime., Disp: , Rfl:    Galcanezumab-gnlm (EMGALITY) 120 MG/ML SOAJ, Inject 120 mg into the skin every 30 (thirty) days., Disp: , Rfl:    hydroxypropyl methylcellulose / hypromellose (ISOPTO TEARS / GONIOVISC) 2.5 % ophthalmic solution, Place 1 drop into both eyes 3 (three) times daily as needed for dry eyes., Disp: , Rfl:    lansoprazole (PREVACID) 30 MG capsule, Take 30 mg by mouth 2 (two) times daily., Disp: , Rfl:    metFORMIN (GLUCOPHAGE) 500 MG tablet, Take 1,000 mg by mouth 2 (two) times daily with a meal., Disp: , Rfl:  metroNIDAZOLE (METROGEL) 0.75 % gel, Apply 1 application. topically 2 (two) times daily., Disp: , Rfl:    pioglitazone (ACTOS) 45 MG tablet, Take 45 mg by mouth daily., Disp: , Rfl:    Simethicone (GAS FREE EXTRA STRENGTH PO), Take 2 tablets by mouth at bedtime., Disp: , Rfl:    sodium chloride (OCEAN) 0.65 % nasal spray, 2 sprays, Disp: , Rfl:    tobramycin (TOBREX) 0.3 % ophthalmic solution, Place 1 drop into both eyes See admin instructions. Instill one drop into the affected eye four times a day prior and the day after eye injection., Disp: , Rfl:    TRESIBA FLEXTOUCH 100 UNIT/ML SOPN FlexTouch Pen, Inject 10 Units into the skin daily., Disp: , Rfl:    triamcinolone (KENALOG) 0.025 % cream, Apply 1 application. topically 2 (two) times daily.,  Disp: , Rfl:   Allergies:  Allergies  Allergen Reactions   Ace Inhibitors Other (See Comments) and Anaphylaxis    Angioedema   Aspirin Other (See Comments)    Cannot take due to gastric bypass surgery   Nsaids Anaphylaxis and Other (See Comments)    Cannot take orally due to s/p gastric bypass   Sular [Nisoldipine Er] Other (See Comments)    angioedema   Nisoldipine Swelling and Other (See Comments)   Other Other (See Comments)   Boniva [Ibandronic Acid] Other (See Comments)    Joint pain    Past Medical History, Surgical history, Social history, and Family History were reviewed and updated.  Review of Systems: Review of Systems  Constitutional: Negative.   HENT: Negative.    Eyes: Negative.   Respiratory: Negative.    Cardiovascular: Negative.   Gastrointestinal: Negative.   Genitourinary: Negative.   Musculoskeletal: Negative.   Skin: Negative.   Neurological: Negative.   Endo/Heme/Allergies: Negative.   Psychiatric/Behavioral: Negative.      Physical Exam:  height is '5\' 1"'$  (1.549 m) and weight is 147 lb (66.7 kg). Her oral temperature is 98.1 F (36.7 C). Her blood pressure is 159/63 (abnormal) and her pulse is 64. Her respiration is 18.   Physical Exam Vitals reviewed.  HENT:     Head: Normocephalic and atraumatic.  Eyes:     Pupils: Pupils are equal, round, and reactive to light.  Cardiovascular:     Rate and Rhythm: Normal rate and regular rhythm.     Heart sounds: Normal heart sounds.  Pulmonary:     Effort: Pulmonary effort is normal.     Breath sounds: Normal breath sounds.  Abdominal:     General: Bowel sounds are normal.     Palpations: Abdomen is soft.  Musculoskeletal:        General: No tenderness or deformity. Normal range of motion.     Cervical back: Normal range of motion.  Lymphadenopathy:     Cervical: No cervical adenopathy.  Skin:    General: Skin is warm and dry.     Findings: No erythema or rash.  Neurological:     Mental  Status: She is alert and oriented to person, place, and time.  Psychiatric:        Behavior: Behavior normal.        Thought Content: Thought content normal.        Judgment: Judgment normal.      Lab Results  Component Value Date   WBC 7.3 07/03/2022   HGB 10.0 (L) 07/03/2022   HCT 32.2 (L) 07/03/2022   MCV 94.7 07/03/2022   PLT 216 07/03/2022  Chemistry      Component Value Date/Time   NA 138 07/03/2022 0919   NA 145 05/10/2017 0748   NA 137 12/06/2016 0746   K 5.9 (H) 07/03/2022 0919   K 4.7 05/10/2017 0748   K 5.2 (H) 12/06/2016 0746   CL 104 07/03/2022 0919   CL 110 (H) 05/10/2017 0748   CO2 24 07/03/2022 0919   CO2 23 05/10/2017 0748   CO2 23 12/06/2016 0746   BUN 43 (H) 07/03/2022 0919   BUN 28 (H) 05/10/2017 0748   BUN 26.1 (H) 12/06/2016 0746   CREATININE 1.36 (H) 07/03/2022 0919   CREATININE 1.2 05/10/2017 0748   CREATININE 1.2 (H) 12/06/2016 0746      Component Value Date/Time   CALCIUM 10.0 07/03/2022 0919   CALCIUM 9.1 05/10/2017 0748   CALCIUM 9.1 12/06/2016 0746   ALKPHOS 45 07/03/2022 0919   ALKPHOS 61 05/10/2017 0748   ALKPHOS 41 12/06/2016 0746   AST 16 07/03/2022 0919   AST 23 12/06/2016 0746   ALT 15 07/03/2022 0919   ALT 31 05/10/2017 0748   ALT 26 12/06/2016 0746   BILITOT 0.3 07/03/2022 0919   BILITOT 0.26 12/06/2016 0746       Impression and Plan: Ms. Rudder is 67 year old white female. She has diabetes. She has iron deficiency anemia. She has a very low erythropoietin level.  We will see what her iron studies show.  Again, she was responds well to the Aranesp.  Her blood sugars are better.  Overall, her prognosis is based upon her diabetes.  Will plan to get her back in 6 weeks.  I think this will be right after her birthday.  Volanda Napoleon, MD 1/8/202410:09 AM

## 2022-07-28 ENCOUNTER — Encounter: Payer: Self-pay | Admitting: Hematology & Oncology

## 2022-08-09 ENCOUNTER — Other Ambulatory Visit: Payer: Self-pay | Admitting: Family Medicine

## 2022-08-09 DIAGNOSIS — M810 Age-related osteoporosis without current pathological fracture: Secondary | ICD-10-CM

## 2022-08-14 ENCOUNTER — Encounter: Payer: Self-pay | Admitting: Hematology & Oncology

## 2022-08-14 ENCOUNTER — Telehealth: Payer: Self-pay

## 2022-08-14 ENCOUNTER — Inpatient Hospital Stay: Payer: Medicare Other

## 2022-08-14 ENCOUNTER — Inpatient Hospital Stay: Payer: Medicare Other | Attending: Hematology & Oncology

## 2022-08-14 ENCOUNTER — Inpatient Hospital Stay (HOSPITAL_BASED_OUTPATIENT_CLINIC_OR_DEPARTMENT_OTHER): Payer: Medicare Other | Admitting: Hematology & Oncology

## 2022-08-14 VITALS — BP 162/78 | HR 69 | Temp 98.2°F | Resp 20 | Ht 61.0 in | Wt 148.1 lb

## 2022-08-14 DIAGNOSIS — Z794 Long term (current) use of insulin: Secondary | ICD-10-CM | POA: Diagnosis not present

## 2022-08-14 DIAGNOSIS — Z9884 Bariatric surgery status: Secondary | ICD-10-CM | POA: Diagnosis not present

## 2022-08-14 DIAGNOSIS — Z79899 Other long term (current) drug therapy: Secondary | ICD-10-CM | POA: Insufficient documentation

## 2022-08-14 DIAGNOSIS — N189 Chronic kidney disease, unspecified: Secondary | ICD-10-CM

## 2022-08-14 DIAGNOSIS — D631 Anemia in chronic kidney disease: Secondary | ICD-10-CM | POA: Diagnosis not present

## 2022-08-14 DIAGNOSIS — N1831 Chronic kidney disease, stage 3a: Secondary | ICD-10-CM | POA: Diagnosis not present

## 2022-08-14 DIAGNOSIS — E119 Type 2 diabetes mellitus without complications: Secondary | ICD-10-CM | POA: Insufficient documentation

## 2022-08-14 DIAGNOSIS — K9049 Malabsorption due to intolerance, not elsewhere classified: Secondary | ICD-10-CM | POA: Diagnosis not present

## 2022-08-14 DIAGNOSIS — K9041 Non-celiac gluten sensitivity: Secondary | ICD-10-CM

## 2022-08-14 DIAGNOSIS — D509 Iron deficiency anemia, unspecified: Secondary | ICD-10-CM | POA: Diagnosis not present

## 2022-08-14 LAB — CBC WITH DIFFERENTIAL (CANCER CENTER ONLY)
Abs Immature Granulocytes: 0.02 10*3/uL (ref 0.00–0.07)
Basophils Absolute: 0.1 10*3/uL (ref 0.0–0.1)
Basophils Relative: 1 %
Eosinophils Absolute: 0.2 10*3/uL (ref 0.0–0.5)
Eosinophils Relative: 2 %
HCT: 39.5 % (ref 36.0–46.0)
Hemoglobin: 12.2 g/dL (ref 12.0–15.0)
Immature Granulocytes: 0 %
Lymphocytes Relative: 21 %
Lymphs Abs: 1.7 10*3/uL (ref 0.7–4.0)
MCH: 29.3 pg (ref 26.0–34.0)
MCHC: 30.9 g/dL (ref 30.0–36.0)
MCV: 94.7 fL (ref 80.0–100.0)
Monocytes Absolute: 0.6 10*3/uL (ref 0.1–1.0)
Monocytes Relative: 7 %
Neutro Abs: 5.8 10*3/uL (ref 1.7–7.7)
Neutrophils Relative %: 69 %
Platelet Count: 226 10*3/uL (ref 150–400)
RBC: 4.17 MIL/uL (ref 3.87–5.11)
RDW: 14.8 % (ref 11.5–15.5)
WBC Count: 8.3 10*3/uL (ref 4.0–10.5)
nRBC: 0 % (ref 0.0–0.2)

## 2022-08-14 LAB — IRON AND IRON BINDING CAPACITY (CC-WL,HP ONLY)
Iron: 135 ug/dL (ref 28–170)
Saturation Ratios: 41 % — ABNORMAL HIGH (ref 10.4–31.8)
TIBC: 332 ug/dL (ref 250–450)
UIBC: 197 ug/dL (ref 148–442)

## 2022-08-14 LAB — CMP (CANCER CENTER ONLY)
ALT: 20 U/L (ref 0–44)
AST: 19 U/L (ref 15–41)
Albumin: 4.7 g/dL (ref 3.5–5.0)
Alkaline Phosphatase: 51 U/L (ref 38–126)
Anion gap: 9 (ref 5–15)
BUN: 41 mg/dL — ABNORMAL HIGH (ref 8–23)
CO2: 28 mmol/L (ref 22–32)
Calcium: 10.4 mg/dL — ABNORMAL HIGH (ref 8.9–10.3)
Chloride: 99 mmol/L (ref 98–111)
Creatinine: 1.38 mg/dL — ABNORMAL HIGH (ref 0.44–1.00)
GFR, Estimated: 42 mL/min — ABNORMAL LOW (ref >60)
Glucose, Bld: 191 mg/dL — ABNORMAL HIGH (ref 70–99)
Potassium: 5.2 mmol/L — ABNORMAL HIGH (ref 3.5–5.1)
Sodium: 136 mmol/L (ref 135–145)
Total Bilirubin: 0.3 mg/dL (ref 0.3–1.2)
Total Protein: 7.8 g/dL (ref 6.5–8.1)

## 2022-08-14 LAB — RETICULOCYTES
Immature Retic Fract: 3.7 % (ref 2.3–15.9)
RBC.: 4.12 MIL/uL (ref 3.87–5.11)
Retic Count, Absolute: 25.5 10*3/uL (ref 19.0–186.0)
Retic Ct Pct: 0.6 % (ref 0.4–3.1)

## 2022-08-14 LAB — FERRITIN: Ferritin: 416 ng/mL — ABNORMAL HIGH (ref 11–307)

## 2022-08-14 NOTE — Progress Notes (Signed)
Hematology and Oncology Follow Up Visit  Kelly Soto [id number] [birth year] 67 y.o. 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 is back for follow-up.  She had a birthday yesterday.  She is 67 years old.  She had a wonderful birthday.  She also had a wonderful Valentine's.  Her diabetes is still her biggest problem.  I know that she is being followed quite closely by Dr. Brigitte Pulse.  When we last saw her, her iron studies actually pretty good.  Her ferritin was 439 with an iron saturation of 26%.  She is trying to watch what she eats.  She is trying to be more active.  She does have a dog that she likes to walk.  She has had no issues with nausea or vomiting.  She has had no cough or shortness of breath.  She has had no change in bowel or bladder habits.  She has had no rashes.  There is been no leg swelling.  She has had no tingling in the hands or feet.  Overall, I would say her performance status is probably ECOG 1.    Medications:  Current Outpatient Medications:    amLODipine (NORVASC) 5 MG tablet, 5 mg daily., Disp: , Rfl:    Ascorbic Acid (VITAMIN C) 1000 MG tablet, Take 1,000 mg by mouth daily., Disp: , Rfl:    baclofen (LIORESAL) 10 MG tablet, 08/14/2022 For migraines., Disp: , Rfl:    betamethasone valerate (VALISONE) 0.1 % cream, Apply 1 application topically daily as needed (irritation)., Disp: , Rfl:    carvedilol (COREG) 25 MG tablet, Take 25 mg by mouth 2 (two) times daily with a meal., Disp: , Rfl:    chlorthalidone (HYGROTON) 25 MG tablet, Take 25 mg by mouth daily., Disp: , Rfl:    Continuous Blood Gluc Sensor (FREESTYLE LIBRE 2 SENSOR) MISC, USE AS DIRECTED TO CHECK BLOOD SUGAR FOR 14  DAYS, Disp: , Rfl:    denosumab (PROLIA) 60 MG/ML SOLN injection, Inject 60 mg into the skin every 6 (six) months. Administer in upper arm, thigh, or abdomen  Next injection is due 05-2016, Disp: , Rfl:    diazepam (VALIUM) 5 MG tablet, Take 5 mg by mouth daily as needed for anxiety. , Disp: , Rfl: 0   empagliflozin (JARDIANCE) 10 MG TABS tablet, Take 10 mg by mouth daily., Disp: , Rfl:    fluocinonide (LIDEX) 0.05 % external solution, Apply 1 application. topically 2 (two) times daily., Disp: , Rfl:    fluticasone (FLONASE) 50 MCG/ACT nasal spray, Place 2 sprays into both nostrils at bedtime., Disp: , Rfl:    Galcanezumab-gnlm (EMGALITY) 120 MG/ML SOAJ, Inject 120 mg into the skin every 30 (thirty) days., Disp: , Rfl:    hydroxypropyl methylcellulose / hypromellose (ISOPTO TEARS / GONIOVISC) 2.5 % ophthalmic solution, Place 1 drop into both eyes 3 (three) times daily as needed for dry eyes., Disp: , Rfl:    Insulin Pen Needle (NOVOFINE PEN NEEDLE) 32G X 6 MM MISC, See admin instructions., Disp: , Rfl:    lansoprazole (PREVACID) 30 MG capsule, Take 30 mg by mouth 2 (two) times daily., Disp: , Rfl:    metFORMIN (GLUCOPHAGE) 500 MG tablet, Take 1,000 mg by  mouth 2 (two) times daily with a meal., Disp: , Rfl:    metroNIDAZOLE (METROGEL) 0.75 % gel, Apply 1 application. topically 2 (two) times daily., Disp: , Rfl:    Multiple Vitamins-Minerals (CENTRUM SILVER 50+WOMEN) TABS, as directed Orally, Disp: , Rfl:    pioglitazone (ACTOS) 45 MG tablet, Take 45 mg by mouth daily., Disp: , Rfl:    saccharomyces boulardii (FLORASTOR) 250 MG capsule, Take 250 mg by mouth 2 (two) times daily., Disp: , Rfl:    Simethicone (GAS FREE EXTRA STRENGTH PO), Take 2 tablets by mouth at bedtime., Disp: , Rfl:    sodium chloride (OCEAN) 0.65 % nasal spray, 2 sprays, Disp: , Rfl:    tobramycin (TOBREX) 0.3 % ophthalmic solution, Place 1 drop into both eyes See admin instructions. Instill one drop into the affected eye four  times a day prior and the day after eye injection., Disp: , Rfl:    TRESIBA FLEXTOUCH 100 UNIT/ML SOPN FlexTouch Pen, Inject 12 Units into the skin daily., Disp: , Rfl:    triamcinolone (KENALOG) 0.025 % cream, Apply 1 application. topically 2 (two) times daily., Disp: , Rfl:   Allergies:  Allergies  Allergen Reactions   Ace Inhibitors Other (See Comments) and Anaphylaxis    Angioedema   Aspirin Other (See Comments)    Cannot take due to gastric bypass surgery   Farxiga [Dapagliflozin] Nausea And Vomiting    Dizziness, tingling in upper extremities.   Nsaids Anaphylaxis and Other (See Comments)    Cannot take orally due to s/p gastric bypass   Sular [Nisoldipine Er] Other (See Comments)    angioedema   Nisoldipine Swelling and Other (See Comments)   Other Other (See Comments)   Boniva [Ibandronic Acid] Other (See Comments)    Joint pain    Past Medical History, Surgical history, Social history, and Family History were reviewed and updated.  Review of Systems: Review of Systems  Constitutional: Negative.   HENT: Negative.    Eyes: Negative.   Respiratory: Negative.    Cardiovascular: Negative.   Gastrointestinal: Negative.   Genitourinary: Negative.   Musculoskeletal: Negative.   Skin: Negative.   Neurological: Negative.   Endo/Heme/Allergies: Negative.   Psychiatric/Behavioral: Negative.      Physical Exam:  height is 5' 1"$  (1.549 m) and weight is 148 lb 1.9 oz (67.2 kg). Her oral temperature is 98.2 F (36.8 C). Her blood pressure is 162/78 (abnormal) and her pulse is 69. Her respiration is 20 and oxygen saturation is 100%.   Physical Exam Vitals reviewed.  HENT:     Head: Normocephalic and atraumatic.  Eyes:     Pupils: Pupils are equal, round, and reactive to light.  Cardiovascular:     Rate and Rhythm: Normal rate and regular rhythm.     Heart sounds: Normal heart sounds.  Pulmonary:     Effort: Pulmonary effort is normal.     Breath sounds: Normal breath  sounds.  Abdominal:     General: Bowel sounds are normal.     Palpations: Abdomen is soft.  Musculoskeletal:        General: No tenderness or deformity. Normal range of motion.     Cervical back: Normal range of motion.  Lymphadenopathy:     Cervical: No cervical adenopathy.  Skin:    General: Skin is warm and dry.     Findings: No erythema or rash.  Neurological:     Mental Status: She is alert and oriented to person, place, and time.  Psychiatric:        Behavior: Behavior normal.        Thought Content: Thought content normal.        Judgment: Judgment normal.      Lab Results  Component Value Date   WBC 8.3 08/14/2022   HGB 12.2 08/14/2022   HCT 39.5 08/14/2022   MCV 94.7 08/14/2022   PLT 226 08/14/2022     Chemistry      Component Value Date/Time   NA 136 08/14/2022 0857   NA 145 05/10/2017 0748   NA 137 12/06/2016 0746   K 5.2 (H) 08/14/2022 0857   K 4.7 05/10/2017 0748   K 5.2 (H) 12/06/2016 0746   CL 99 08/14/2022 0857   CL 110 (H) 05/10/2017 0748   CO2 28 08/14/2022 0857   CO2 23 05/10/2017 0748   CO2 23 12/06/2016 0746   BUN 41 (H) 08/14/2022 0857   BUN 28 (H) 05/10/2017 0748   BUN 26.1 (H) 12/06/2016 0746   CREATININE 1.38 (H) 08/14/2022 0857   CREATININE 1.2 05/10/2017 0748   CREATININE 1.2 (H) 12/06/2016 0746      Component Value Date/Time   CALCIUM 10.4 (H) 08/14/2022 0857   CALCIUM 9.1 05/10/2017 0748   CALCIUM 9.1 12/06/2016 0746   ALKPHOS 51 08/14/2022 0857   ALKPHOS 61 05/10/2017 0748   ALKPHOS 41 12/06/2016 0746   AST 19 08/14/2022 0857   AST 23 12/06/2016 0746   ALT 20 08/14/2022 0857   ALT 31 05/10/2017 0748   ALT 26 12/06/2016 0746   BILITOT 0.3 08/14/2022 0857   BILITOT 0.26 12/06/2016 0746       Impression and Plan: Ms. Morace is 67 year old white female. She has diabetes. She has iron deficiency anemia. She has a very low erythropoietin level.  Her hemoglobin is doing quite well today.  She does not need any Aranesp.   I would think that iron studies should be okay.  He will be interesting to see how her diabetes can be additionally managed.  I think she was a started on Jardiance.  She is also on insulin.  I was thought that it would be her diabetes I will dictate her prognosis.  I think we can now get her back in 6 weeks.   Volanda Napoleon, MD 2/19/20249:35 AM

## 2022-08-14 NOTE — Telephone Encounter (Signed)
Advised via MyChart.

## 2022-08-14 NOTE — Telephone Encounter (Signed)
-----   Message from Volanda Napoleon, MD sent at 08/14/2022  2:03 PM EST ----- Please call and let her know that the iron level is okay.  Thanks.  Laurey Arrow

## 2022-09-25 ENCOUNTER — Inpatient Hospital Stay: Payer: Medicare Other | Admitting: Medical Oncology

## 2022-09-25 ENCOUNTER — Inpatient Hospital Stay: Payer: Medicare Other

## 2022-09-28 ENCOUNTER — Inpatient Hospital Stay: Payer: Medicare Other | Attending: Hematology & Oncology

## 2022-09-28 ENCOUNTER — Inpatient Hospital Stay (HOSPITAL_BASED_OUTPATIENT_CLINIC_OR_DEPARTMENT_OTHER): Payer: Medicare Other | Admitting: Medical Oncology

## 2022-09-28 ENCOUNTER — Inpatient Hospital Stay: Payer: Medicare Other

## 2022-09-28 ENCOUNTER — Encounter: Payer: Self-pay | Admitting: Medical Oncology

## 2022-09-28 VITALS — BP 157/80 | HR 71 | Resp 18 | Wt 148.0 lb

## 2022-09-28 DIAGNOSIS — N183 Chronic kidney disease, stage 3 unspecified: Secondary | ICD-10-CM | POA: Insufficient documentation

## 2022-09-28 DIAGNOSIS — E611 Iron deficiency: Secondary | ICD-10-CM | POA: Diagnosis not present

## 2022-09-28 DIAGNOSIS — N1831 Chronic kidney disease, stage 3a: Secondary | ICD-10-CM

## 2022-09-28 DIAGNOSIS — K9049 Malabsorption due to intolerance, not elsewhere classified: Secondary | ICD-10-CM | POA: Diagnosis not present

## 2022-09-28 DIAGNOSIS — N189 Chronic kidney disease, unspecified: Secondary | ICD-10-CM

## 2022-09-28 DIAGNOSIS — D631 Anemia in chronic kidney disease: Secondary | ICD-10-CM

## 2022-09-28 DIAGNOSIS — D500 Iron deficiency anemia secondary to blood loss (chronic): Secondary | ICD-10-CM

## 2022-09-28 DIAGNOSIS — D509 Iron deficiency anemia, unspecified: Secondary | ICD-10-CM | POA: Insufficient documentation

## 2022-09-28 DIAGNOSIS — Z79899 Other long term (current) drug therapy: Secondary | ICD-10-CM | POA: Diagnosis not present

## 2022-09-28 LAB — CMP (CANCER CENTER ONLY)
ALT: 14 U/L (ref 0–44)
AST: 14 U/L — ABNORMAL LOW (ref 15–41)
Albumin: 4.2 g/dL (ref 3.5–5.0)
Alkaline Phosphatase: 39 U/L (ref 38–126)
Anion gap: 11 (ref 5–15)
BUN: 35 mg/dL — ABNORMAL HIGH (ref 8–23)
CO2: 25 mmol/L (ref 22–32)
Calcium: 10 mg/dL (ref 8.9–10.3)
Chloride: 100 mmol/L (ref 98–111)
Creatinine: 1.37 mg/dL — ABNORMAL HIGH (ref 0.44–1.00)
GFR, Estimated: 42 mL/min — ABNORMAL LOW (ref >60)
Glucose, Bld: 236 mg/dL — ABNORMAL HIGH (ref 70–99)
Potassium: 5.1 mmol/L (ref 3.5–5.1)
Sodium: 136 mmol/L (ref 135–145)
Total Bilirubin: 0.2 mg/dL — ABNORMAL LOW (ref 0.3–1.2)
Total Protein: 7.4 g/dL (ref 6.5–8.1)

## 2022-09-28 LAB — CBC WITH DIFFERENTIAL (CANCER CENTER ONLY)
Abs Immature Granulocytes: 0.1 10*3/uL — ABNORMAL HIGH (ref 0.00–0.07)
Basophils Absolute: 0.1 10*3/uL (ref 0.0–0.1)
Basophils Relative: 1 %
Eosinophils Absolute: 0.1 10*3/uL (ref 0.0–0.5)
Eosinophils Relative: 1 %
HCT: 33.5 % — ABNORMAL LOW (ref 36.0–46.0)
Hemoglobin: 10.6 g/dL — ABNORMAL LOW (ref 12.0–15.0)
Immature Granulocytes: 1 %
Lymphocytes Relative: 9 %
Lymphs Abs: 0.7 10*3/uL (ref 0.7–4.0)
MCH: 29.7 pg (ref 26.0–34.0)
MCHC: 31.6 g/dL (ref 30.0–36.0)
MCV: 93.8 fL (ref 80.0–100.0)
Monocytes Absolute: 0.8 10*3/uL (ref 0.1–1.0)
Monocytes Relative: 11 %
Neutro Abs: 5.8 10*3/uL (ref 1.7–7.7)
Neutrophils Relative %: 77 %
Platelet Count: 193 10*3/uL (ref 150–400)
RBC: 3.57 MIL/uL — ABNORMAL LOW (ref 3.87–5.11)
RDW: 13.9 % (ref 11.5–15.5)
WBC Count: 7.6 10*3/uL (ref 4.0–10.5)
nRBC: 0 % (ref 0.0–0.2)

## 2022-09-28 LAB — RETICULOCYTES
Immature Retic Fract: 10.3 % (ref 2.3–15.9)
RBC.: 3.61 MIL/uL — ABNORMAL LOW (ref 3.87–5.11)
Retic Count, Absolute: 40.1 10*3/uL (ref 19.0–186.0)
Retic Ct Pct: 1.1 % (ref 0.4–3.1)

## 2022-09-28 LAB — FERRITIN: Ferritin: 489 ng/mL — ABNORMAL HIGH (ref 11–307)

## 2022-09-28 LAB — IRON AND IRON BINDING CAPACITY (CC-WL,HP ONLY)
Iron: 32 ug/dL (ref 28–170)
Saturation Ratios: 10 % — ABNORMAL LOW (ref 10.4–31.8)
TIBC: 318 ug/dL (ref 250–450)
UIBC: 286 ug/dL (ref 148–442)

## 2022-09-28 MED ORDER — DARBEPOETIN ALFA 300 MCG/0.6ML IJ SOSY
300.0000 ug | PREFILLED_SYRINGE | Freq: Once | INTRAMUSCULAR | Status: AC
  Administered 2022-09-28: 300 ug via SUBCUTANEOUS
  Filled 2022-09-28: qty 0.6

## 2022-09-28 NOTE — Progress Notes (Signed)
Hematology and Oncology Follow Up Visit  Kelly Soto [id number] 12-21-1955 67 y.o. 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 is back for follow-up of her anemia which is multifactorial   We last saw her back in Feb, 2024. At this time her ferritin was 416 with an iron saturation of 41%.  She reports that she is feeling well and doing well. She has an upcoming beach trip planned which she is excited for.   She has had no issues with nausea or vomiting.  She has had no cough or shortness of breath.  She has had no change in bowel or bladder habits.  She has had no rashes.  There is been no leg swelling.  Overall, I would say her performance status is probably ECOG 1.   Wt Readings from Last 3 Encounters:  09/28/22 148 lb 0.6 oz (67.2 kg)  08/14/22 148 lb 1.9 oz (67.2 kg)  07/03/22 147 lb (66.7 kg)     Medications:  Current Outpatient Medications:    amLODipine (NORVASC) 5 MG tablet, 5 mg daily., Disp: , Rfl:    Ascorbic Acid (VITAMIN C) 1000 MG tablet, Take 1,000 mg by mouth daily., Disp: , Rfl:    baclofen (LIORESAL) 10 MG tablet, 08/14/2022 For migraines., Disp: , Rfl:    betamethasone valerate (VALISONE) 0.1 % cream, Apply 1 application topically daily as needed (irritation)., Disp: , Rfl:    carvedilol (COREG) 25 MG tablet, Take 25 mg by mouth 2 (two) times daily with a meal., Disp: , Rfl:    chlorthalidone (HYGROTON) 25 MG tablet, Take 25 mg by mouth daily., Disp: , Rfl:    Continuous Blood Gluc Sensor (FREESTYLE LIBRE 2 SENSOR) MISC, USE AS DIRECTED TO CHECK BLOOD SUGAR FOR 14 DAYS, Disp: , Rfl:    denosumab (PROLIA) 60 MG/ML SOLN injection, Inject 60 mg into the skin every 6 (six) months.  Administer in upper arm, thigh, or abdomen  Next injection is due 05-2016, Disp: , Rfl:    diazepam (VALIUM) 5 MG tablet, Take 5 mg by mouth daily as needed for anxiety. , Disp: , Rfl: 0   empagliflozin (JARDIANCE) 10 MG TABS tablet, Take 10 mg by mouth daily., Disp: , Rfl:    fluocinonide (LIDEX) 0.05 % external solution, Apply 1 application. topically 2 (two) times daily., Disp: , Rfl:    fluticasone (FLONASE) 50 MCG/ACT nasal spray, Place 2 sprays into both nostrils at bedtime., Disp: , Rfl:    Galcanezumab-gnlm (EMGALITY) 120 MG/ML SOAJ, Inject 120 mg into the skin every 30 (thirty) days., Disp: , Rfl:    hydroxypropyl methylcellulose / hypromellose (ISOPTO TEARS / GONIOVISC) 2.5 % ophthalmic solution, Place 1 drop into both eyes 3 (three) times daily as needed for dry eyes., Disp: , Rfl:    Insulin Pen Needle (NOVOFINE PEN NEEDLE) 32G X 6 MM MISC, See admin instructions., Disp: , Rfl:    lansoprazole (PREVACID) 30 MG capsule, Take 30 mg by mouth 2 (two) times daily., Disp: , Rfl:    metFORMIN (GLUCOPHAGE) 500 MG tablet, Take 1,000 mg by mouth 2 (two) times daily with a meal., Disp: , Rfl:    metroNIDAZOLE (METROGEL) 0.75 % gel, Apply 1 application. topically 2 (  two) times daily., Disp: , Rfl:    Multiple Vitamins-Minerals (CENTRUM SILVER 50+WOMEN) TABS, as directed Orally, Disp: , Rfl:    pioglitazone (ACTOS) 45 MG tablet, Take 45 mg by mouth daily., Disp: , Rfl:    saccharomyces boulardii (FLORASTOR) 250 MG capsule, Take 250 mg by mouth 2 (two) times daily., Disp: , Rfl:    Simethicone (GAS FREE EXTRA STRENGTH PO), Take 2 tablets by mouth at bedtime., Disp: , Rfl:    sodium chloride (OCEAN) 0.65 % nasal spray, 2 sprays, Disp: , Rfl:    tobramycin (TOBREX) 0.3 % ophthalmic solution, Place 1 drop into both eyes See admin instructions. Instill one drop into the affected eye four times a day prior and the day after eye injection., Disp: , Rfl:    TRESIBA FLEXTOUCH 100 UNIT/ML SOPN FlexTouch Pen,  Inject 12 Units into the skin daily., Disp: , Rfl:    triamcinolone (KENALOG) 0.025 % cream, Apply 1 application. topically 2 (two) times daily., Disp: , Rfl:  No current facility-administered medications for this visit.  Facility-Administered Medications Ordered in Other Visits:    Darbepoetin Alfa (ARANESP) injection 300 mcg, 300 mcg, Subcutaneous, Once, Ennever, Rose Phi, MD  Allergies:  Allergies  Allergen Reactions   Ace Inhibitors Other (See Comments) and Anaphylaxis    Angioedema   Aspirin Other (See Comments)    Cannot take due to gastric bypass surgery   Farxiga [Dapagliflozin] Nausea And Vomiting    Dizziness, tingling in upper extremities.   Nsaids Anaphylaxis and Other (See Comments)    Cannot take orally due to s/p gastric bypass   Sular [Nisoldipine Er] Other (See Comments)    angioedema   Nisoldipine Swelling and Other (See Comments)   Other Other (See Comments)   Boniva [Ibandronic Acid] Other (See Comments)    Joint pain    Past Medical History, Surgical history, Social history, and Family History were reviewed and updated.  Review of Systems: Review of Systems  Constitutional: Negative.   HENT: Negative.    Eyes: Negative.   Respiratory: Negative.    Cardiovascular: Negative.   Gastrointestinal: Negative.   Genitourinary: Negative.   Musculoskeletal: Negative.   Skin: Negative.   Neurological: Negative.   Endo/Heme/Allergies: Negative.   Psychiatric/Behavioral: Negative.      Physical Exam:  weight is 148 lb 0.6 oz (67.2 kg). Her blood pressure is 157/80 (abnormal) and her pulse is 71. Her respiration is 18 and oxygen saturation is 100%.   Physical Exam Vitals reviewed.  HENT:     Head: Normocephalic and atraumatic.  Eyes:     Pupils: Pupils are equal, round, and reactive to light.  Cardiovascular:     Rate and Rhythm: Normal rate and regular rhythm.     Heart sounds: Normal heart sounds.  Pulmonary:     Effort: Pulmonary effort is normal.      Breath sounds: Normal breath sounds.  Musculoskeletal:        General: No tenderness or deformity. Normal range of motion.     Cervical back: Normal range of motion.  Lymphadenopathy:     Cervical: No cervical adenopathy.  Skin:    General: Skin is warm and dry.     Findings: No erythema or rash.  Neurological:     Mental Status: She is alert and oriented to person, place, and time.  Psychiatric:        Behavior: Behavior normal.        Thought Content: Thought content normal.  Judgment: Judgment normal.     Lab Results  Component Value Date   WBC 7.6 09/28/2022   HGB 10.6 (L) 09/28/2022   HCT 33.5 (L) 09/28/2022   MCV 93.8 09/28/2022   PLT 193 09/28/2022     Chemistry      Component Value Date/Time   NA 136 09/28/2022 0936   NA 145 05/10/2017 0748   NA 137 12/06/2016 0746   K 5.1 09/28/2022 0936   K 4.7 05/10/2017 0748   K 5.2 (H) 12/06/2016 0746   CL 100 09/28/2022 0936   CL 110 (H) 05/10/2017 0748   CO2 25 09/28/2022 0936   CO2 23 05/10/2017 0748   CO2 23 12/06/2016 0746   BUN 35 (H) 09/28/2022 0936   BUN 28 (H) 05/10/2017 0748   BUN 26.1 (H) 12/06/2016 0746   CREATININE 1.37 (H) 09/28/2022 0936   CREATININE 1.2 05/10/2017 0748   CREATININE 1.2 (H) 12/06/2016 0746      Component Value Date/Time   CALCIUM 10.0 09/28/2022 0936   CALCIUM 9.1 05/10/2017 0748   CALCIUM 9.1 12/06/2016 0746   ALKPHOS 39 09/28/2022 0936   ALKPHOS 61 05/10/2017 0748   ALKPHOS 41 12/06/2016 0746   AST 14 (L) 09/28/2022 0936   AST 23 12/06/2016 0746   ALT 14 09/28/2022 0936   ALT 31 05/10/2017 0748   ALT 26 12/06/2016 0746   BILITOT 0.2 (L) 09/28/2022 0936   BILITOT 0.26 12/06/2016 0746       Impression and Plan: Kelly Soto is 67 year old white female. Has iron deficiency anemia as well as a very low erythropoietin level.  Today her Hgb is 10.6. She would benefit from Aranesp.  Iron studies pending.   She is still working with her primary care provider on her  diabetes.   I think we can now get her back in 6 weeks.   Rushie Chestnut, PA-C 4/4/202410:46 AM

## 2022-09-28 NOTE — Patient Instructions (Signed)

## 2022-09-29 ENCOUNTER — Encounter: Payer: Self-pay | Admitting: Hematology & Oncology

## 2022-10-06 ENCOUNTER — Inpatient Hospital Stay: Payer: Medicare Other

## 2022-10-06 VITALS — BP 142/63 | HR 71 | Temp 98.4°F

## 2022-10-06 DIAGNOSIS — N1831 Chronic kidney disease, stage 3a: Secondary | ICD-10-CM | POA: Diagnosis not present

## 2022-10-06 DIAGNOSIS — N183 Chronic kidney disease, stage 3 unspecified: Secondary | ICD-10-CM | POA: Diagnosis not present

## 2022-10-06 DIAGNOSIS — N189 Chronic kidney disease, unspecified: Secondary | ICD-10-CM

## 2022-10-06 MED ORDER — SODIUM CHLORIDE 0.9 % IV SOLN: Freq: Once | INTRAVENOUS | Status: AC

## 2022-10-06 MED ORDER — SODIUM CHLORIDE 0.9 % IV SOLN
510.0000 mg | Freq: Once | INTRAVENOUS | Status: AC
  Administered 2022-10-06: 510 mg via INTRAVENOUS
  Filled 2022-10-06: qty 510

## 2022-10-06 NOTE — Patient Instructions (Signed)

## 2022-10-13 ENCOUNTER — Inpatient Hospital Stay: Payer: Medicare Other

## 2022-10-13 ENCOUNTER — Other Ambulatory Visit: Payer: Self-pay

## 2022-10-13 VITALS — BP 156/67 | HR 65 | Temp 98.2°F | Resp 18

## 2022-10-13 DIAGNOSIS — N1831 Chronic kidney disease, stage 3a: Secondary | ICD-10-CM | POA: Diagnosis not present

## 2022-10-13 DIAGNOSIS — N189 Chronic kidney disease, unspecified: Secondary | ICD-10-CM

## 2022-10-13 DIAGNOSIS — N183 Chronic kidney disease, stage 3 unspecified: Secondary | ICD-10-CM | POA: Diagnosis not present

## 2022-10-13 MED ORDER — SODIUM CHLORIDE 0.9 % IV SOLN
510.0000 mg | Freq: Once | INTRAVENOUS | Status: AC
  Administered 2022-10-13: 510 mg via INTRAVENOUS
  Filled 2022-10-13: qty 17

## 2022-10-13 MED ORDER — SODIUM CHLORIDE 0.9 % IV SOLN: Freq: Once | INTRAVENOUS | Status: AC

## 2022-10-13 NOTE — Progress Notes (Signed)
Patient refused to wait 30 minutes post infusion. Released stable and ASX. 

## 2022-10-13 NOTE — Patient Instructions (Signed)

## 2022-11-06 ENCOUNTER — Other Ambulatory Visit: Payer: Self-pay | Admitting: Family

## 2022-11-06 DIAGNOSIS — D500 Iron deficiency anemia secondary to blood loss (chronic): Secondary | ICD-10-CM

## 2022-11-06 DIAGNOSIS — K9049 Malabsorption due to intolerance, not elsewhere classified: Secondary | ICD-10-CM

## 2022-11-06 DIAGNOSIS — N1831 Chronic kidney disease, stage 3a: Secondary | ICD-10-CM

## 2022-11-06 DIAGNOSIS — N189 Chronic kidney disease, unspecified: Secondary | ICD-10-CM

## 2022-11-07 ENCOUNTER — Encounter: Payer: Self-pay | Admitting: Family

## 2022-11-07 ENCOUNTER — Other Ambulatory Visit: Payer: Self-pay

## 2022-11-07 ENCOUNTER — Inpatient Hospital Stay: Payer: Medicare Other

## 2022-11-07 ENCOUNTER — Inpatient Hospital Stay: Payer: Medicare Other | Attending: Hematology & Oncology

## 2022-11-07 ENCOUNTER — Inpatient Hospital Stay (HOSPITAL_BASED_OUTPATIENT_CLINIC_OR_DEPARTMENT_OTHER): Payer: Medicare Other | Admitting: Family

## 2022-11-07 VITALS — BP 145/63 | HR 68 | Temp 98.2°F | Resp 18 | Ht 61.0 in | Wt 150.0 lb

## 2022-11-07 DIAGNOSIS — N189 Chronic kidney disease, unspecified: Secondary | ICD-10-CM | POA: Diagnosis not present

## 2022-11-07 DIAGNOSIS — N1831 Chronic kidney disease, stage 3a: Secondary | ICD-10-CM | POA: Diagnosis not present

## 2022-11-07 DIAGNOSIS — D631 Anemia in chronic kidney disease: Secondary | ICD-10-CM

## 2022-11-07 DIAGNOSIS — D500 Iron deficiency anemia secondary to blood loss (chronic): Secondary | ICD-10-CM

## 2022-11-07 DIAGNOSIS — N183 Chronic kidney disease, stage 3 unspecified: Secondary | ICD-10-CM | POA: Diagnosis present

## 2022-11-07 DIAGNOSIS — K9049 Malabsorption due to intolerance, not elsewhere classified: Secondary | ICD-10-CM

## 2022-11-07 LAB — RETICULOCYTES
Immature Retic Fract: 1.3 % — ABNORMAL LOW (ref 2.3–15.9)
RBC.: 4.12 MIL/uL (ref 3.87–5.11)
Retic Count, Absolute: 22.7 10*3/uL (ref 19.0–186.0)
Retic Ct Pct: 0.6 % (ref 0.4–3.1)

## 2022-11-07 LAB — CMP (CANCER CENTER ONLY)
ALT: 19 U/L (ref 0–44)
AST: 17 U/L (ref 15–41)
Albumin: 4.4 g/dL (ref 3.5–5.0)
Alkaline Phosphatase: 48 U/L (ref 38–126)
Anion gap: 8 (ref 5–15)
BUN: 44 mg/dL — ABNORMAL HIGH (ref 8–23)
CO2: 27 mmol/L (ref 22–32)
Calcium: 10.1 mg/dL (ref 8.9–10.3)
Chloride: 104 mmol/L (ref 98–111)
Creatinine: 1.49 mg/dL — ABNORMAL HIGH (ref 0.44–1.00)
GFR, Estimated: 38 mL/min — ABNORMAL LOW (ref >60)
Glucose, Bld: 190 mg/dL — ABNORMAL HIGH (ref 70–99)
Potassium: 5.2 mmol/L — ABNORMAL HIGH (ref 3.5–5.1)
Sodium: 139 mmol/L (ref 135–145)
Total Bilirubin: 0.3 mg/dL (ref 0.3–1.2)
Total Protein: 7.1 g/dL (ref 6.5–8.1)

## 2022-11-07 LAB — IRON AND IRON BINDING CAPACITY (CC-WL,HP ONLY)
Iron: 196 ug/dL — ABNORMAL HIGH (ref 28–170)
Saturation Ratios: 69 % — ABNORMAL HIGH (ref 10.4–31.8)
TIBC: 283 ug/dL (ref 250–450)
UIBC: 87 ug/dL — ABNORMAL LOW (ref 148–442)

## 2022-11-07 LAB — CBC WITH DIFFERENTIAL (CANCER CENTER ONLY)
Abs Immature Granulocytes: 0.03 10*3/uL (ref 0.00–0.07)
Basophils Absolute: 0.1 10*3/uL (ref 0.0–0.1)
Basophils Relative: 1 %
Eosinophils Absolute: 0.1 10*3/uL (ref 0.0–0.5)
Eosinophils Relative: 1 %
HCT: 40.6 % (ref 36.0–46.0)
Hemoglobin: 12.6 g/dL (ref 12.0–15.0)
Immature Granulocytes: 0 %
Lymphocytes Relative: 17 %
Lymphs Abs: 1.4 10*3/uL (ref 0.7–4.0)
MCH: 30.1 pg (ref 26.0–34.0)
MCHC: 31 g/dL (ref 30.0–36.0)
MCV: 96.9 fL (ref 80.0–100.0)
Monocytes Absolute: 0.7 10*3/uL (ref 0.1–1.0)
Monocytes Relative: 8 %
Neutro Abs: 6.3 10*3/uL (ref 1.7–7.7)
Neutrophils Relative %: 73 %
Platelet Count: 184 10*3/uL (ref 150–400)
RBC: 4.19 MIL/uL (ref 3.87–5.11)
RDW: 15.2 % (ref 11.5–15.5)
WBC Count: 8.6 10*3/uL (ref 4.0–10.5)
nRBC: 0 % (ref 0.0–0.2)

## 2022-11-07 LAB — FERRITIN: Ferritin: 769 ng/mL — ABNORMAL HIGH (ref 11–307)

## 2022-11-07 NOTE — Progress Notes (Signed)
Hematology and Oncology Follow Up Visit  Kelly Soto [id number] 07-Sep-1955 67 y.o. 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 She is doing quite well and has no complaints at this time. She tolerated IV iron and ESA injection nicely and energy is much improved.  No issue with blood loss, bruising or petechiae.  She has occasional dizziness.  No fever, chills, n/v, cough, rash,SOB, chest pain, palpitations, abdominal pain or changes in bowel or bladder habits.  No swelling, tenderness, numbness or tingling in her extremities at this time. She has tingling in the bottoms of her feet that comes and goes   No falls or syncope.  Appetite and hydration are good. Weight is stable at 150 lbs.   ECOG Performance Status: 0 - Asymptomatic  Medications:  Allergies as of 11/07/2022       Reactions   Ace Inhibitors Other (See Comments), Anaphylaxis   Angioedema   Aspirin Other (See Comments)   Cannot take due to gastric bypass surgery   Farxiga [dapagliflozin] Nausea And Vomiting   Dizziness, tingling in upper extremities.   Nsaids Anaphylaxis, Other (See Comments)   Cannot take orally due to s/p gastric bypass   Sular [nisoldipine Er] Other (See Comments)   angioedema   Nisoldipine Swelling, Other (See Comments)   Other Other (See Comments)   Boniva [ibandronic Acid] Other (See Comments)   Joint pain        Medication List        Accurate as of Nov 07, 2022  8:40 AM. If you have any questions, ask your nurse or doctor.          amLODipine 5 MG tablet Commonly known as: NORVASC 5 mg daily.   baclofen 10 MG tablet Commonly known as: LIORESAL 08/14/2022 For  migraines.   betamethasone valerate 0.1 % cream Commonly known as: VALISONE Apply 1 application topically daily as needed (irritation).   carvedilol 25 MG tablet Commonly known as: COREG Take 25 mg by mouth 2 (two) times daily with a meal.   Centrum Silver 50+Women Tabs as directed Orally   chlorthalidone 25 MG tablet Commonly known as: HYGROTON Take 25 mg by mouth daily.   denosumab 60 MG/ML Soln injection Commonly known as: PROLIA Inject 60 mg into the skin every 6 (six) months. Administer in upper arm, thigh, or abdomen  Next injection is due 05-2016   diazepam 5 MG tablet Commonly known as: VALIUM Take 5 mg by mouth daily as needed for anxiety.   Emgality 120 MG/ML Soaj Generic drug: Galcanezumab-gnlm Inject 120 mg into the skin every 30 (thirty) days.   fluocinonide 0.05 % external solution Commonly known as: LIDEX Apply 1 application. topically 2 (two) times daily.   fluticasone 50 MCG/ACT nasal spray Commonly known as: FLONASE Place 2 sprays into both nostrils at bedtime.   FreeStyle Libre 2 Sensor Misc USE AS DIRECTED TO CHECK BLOOD SUGAR FOR 14 DAYS   GAS FREE EXTRA STRENGTH PO Take 2 tablets by mouth at bedtime.   hydroxypropyl methylcellulose / hypromellose 2.5 % ophthalmic solution Commonly known as: ISOPTO TEARS / GONIOVISC Place 1 drop into both eyes 3 (three) times daily as  needed for dry eyes.   Jardiance 10 MG Tabs tablet Generic drug: empagliflozin Take 10 mg by mouth daily.   lansoprazole 30 MG capsule Commonly known as: PREVACID Take 30 mg by mouth 2 (two) times daily.   metFORMIN 500 MG tablet Commonly known as: GLUCOPHAGE Take 1,000 mg by mouth 2 (two) times daily with a meal.   metroNIDAZOLE 0.75 % gel Commonly known as: METROGEL Apply 1 application. topically 2 (two) times daily.   Novofine Pen Needle 32G X 6 MM Misc Generic drug: Insulin Pen Needle See admin instructions.   pioglitazone 45 MG tablet Commonly known as:  ACTOS Take 45 mg by mouth daily.   saccharomyces boulardii 250 MG capsule Commonly known as: FLORASTOR Take 250 mg by mouth 2 (two) times daily.   sodium chloride 0.65 % nasal spray Commonly known as: OCEAN 2 sprays   tobramycin 0.3 % ophthalmic solution Commonly known as: TOBREX Place 1 drop into both eyes See admin instructions. Instill one drop into the affected eye four times a day prior and the day after eye injection.   Kelly Soto FlexTouch 100 UNIT/ML FlexTouch Pen Generic drug: insulin degludec Inject 12 Units into the skin daily.   triamcinolone 0.025 % cream Commonly known as: KENALOG Apply 1 application. topically 2 (two) times daily.   vitamin C 1000 MG tablet Take 1,000 mg by mouth daily.        Allergies:  Allergies  Allergen Reactions   Ace Inhibitors Other (See Comments) and Anaphylaxis    Angioedema   Aspirin Other (See Comments)    Cannot take due to gastric bypass surgery   Farxiga [Dapagliflozin] Nausea And Vomiting    Dizziness, tingling in upper extremities.   Nsaids Anaphylaxis and Other (See Comments)    Cannot take orally due to s/p gastric bypass   Sular [Nisoldipine Er] Other (See Comments)    angioedema   Nisoldipine Swelling and Other (See Comments)   Other Other (See Comments)   Boniva [Ibandronic Acid] Other (See Comments)    Joint pain    Past Medical History, Surgical history, Social history, and Family History were reviewed and updated.  Review of Systems: All other 10 point review of systems is negative.   Physical Exam:  height is 5\' 1"  (1.549 m) and weight is 150 lb (68 kg). Her oral temperature is 98.2 F (36.8 C). Her blood pressure is 145/63 (abnormal) and her pulse is 68. Her respiration is 18 and oxygen saturation is 100%.   Wt Readings from Last 3 Encounters:  11/07/22 150 lb (68 kg)  09/28/22 148 lb 0.6 oz (67.2 kg)  08/14/22 148 lb 1.9 oz (67.2 kg)    Ocular: Sclerae unicteric, pupils equal, round and reactive  to light Ear-nose-throat: Oropharynx clear, dentition fair Lymphatic: No cervical or supraclavicular adenopathy Lungs no rales or rhonchi, good excursion bilaterally Heart regular rate and rhythm, no murmur appreciated Abd soft, nontender, positive bowel sounds MSK no focal spinal tenderness, no joint edema Neuro: non-focal, well-oriented, appropriate affect Breasts: Deferred   Lab Results  Component Value Date   WBC 8.6 11/07/2022   HGB 12.6 11/07/2022   HCT 40.6 11/07/2022   MCV 96.9 11/07/2022   PLT 184 11/07/2022   Lab Results  Component Value Date   FERRITIN 489 (H) 09/28/2022   IRON 32 09/28/2022   TIBC 318 09/28/2022   UIBC 286 09/28/2022   IRONPCTSAT 10 (L) 09/28/2022   Lab Results  Component Value Date   RETICCTPCT 0.6 11/07/2022  RBC 4.12 11/07/2022   RETICCTABS 21.6 01/12/2015   No results found for: "KPAFRELGTCHN", "LAMBDASER", "KAPLAMBRATIO" No results found for: "IGGSERUM", "IGA", "IGMSERUM" Lab Results  Component Value Date   TOTALPROTELP 7.3 07/23/2013   ALBUMINELP 61.9 07/23/2013   A1GS 3.7 07/23/2013   A2GS 10.8 07/23/2013   BETS 7.0 07/23/2013   BETA2SER 3.8 07/23/2013   GAMS 12.8 07/23/2013   MSPIKE NOT DET 07/23/2013   SPEI * 07/23/2013     Chemistry      Component Value Date/Time   NA 136 09/28/2022 0936   NA 145 05/10/2017 0748   NA 137 12/06/2016 0746   K 5.1 09/28/2022 0936   K 4.7 05/10/2017 0748   K 5.2 (H) 12/06/2016 0746   CL 100 09/28/2022 0936   CL 110 (H) 05/10/2017 0748   CO2 25 09/28/2022 0936   CO2 23 05/10/2017 0748   CO2 23 12/06/2016 0746   BUN 35 (H) 09/28/2022 0936   BUN 28 (H) 05/10/2017 0748   BUN 26.1 (H) 12/06/2016 0746   CREATININE 1.37 (H) 09/28/2022 0936   CREATININE 1.2 05/10/2017 0748   CREATININE 1.2 (H) 12/06/2016 0746      Component Value Date/Time   CALCIUM 10.0 09/28/2022 0936   CALCIUM 9.1 05/10/2017 0748   CALCIUM 9.1 12/06/2016 0746   ALKPHOS 39 09/28/2022 0936   ALKPHOS 61 05/10/2017  0748   ALKPHOS 41 12/06/2016 0746   AST 14 (L) 09/28/2022 0936   AST 23 12/06/2016 0746   ALT 14 09/28/2022 0936   ALT 31 05/10/2017 0748   ALT 26 12/06/2016 0746   BILITOT 0.2 (L) 09/28/2022 0936   BILITOT 0.26 12/06/2016 0746       Impression and Plan: Ms. Planas is a very pleasant 11 y o caucasian female with multifactorial anemia.  Iron studies are pending.  No ESA give this visit, Hgb 12.6.  Follow-up in 6 weeks.   Eileen Stanford, NP 5/14/20248:40 AM

## 2022-11-09 ENCOUNTER — Inpatient Hospital Stay: Payer: Medicare Other

## 2022-11-09 ENCOUNTER — Ambulatory Visit: Payer: BLUE CROSS/BLUE SHIELD | Admitting: Medical Oncology

## 2022-11-09 ENCOUNTER — Ambulatory Visit: Payer: BLUE CROSS/BLUE SHIELD

## 2022-12-18 ENCOUNTER — Other Ambulatory Visit: Payer: Self-pay | Admitting: Family Medicine

## 2022-12-18 DIAGNOSIS — Z1231 Encounter for screening mammogram for malignant neoplasm of breast: Secondary | ICD-10-CM

## 2022-12-19 ENCOUNTER — Inpatient Hospital Stay: Payer: Medicare Other

## 2022-12-19 ENCOUNTER — Encounter: Payer: Self-pay | Admitting: Hematology & Oncology

## 2022-12-19 ENCOUNTER — Ambulatory Visit: Payer: BLUE CROSS/BLUE SHIELD

## 2022-12-19 ENCOUNTER — Ambulatory Visit: Payer: BLUE CROSS/BLUE SHIELD | Admitting: Family

## 2022-12-19 ENCOUNTER — Inpatient Hospital Stay (HOSPITAL_BASED_OUTPATIENT_CLINIC_OR_DEPARTMENT_OTHER): Payer: Medicare Other | Admitting: Hematology & Oncology

## 2022-12-19 ENCOUNTER — Other Ambulatory Visit: Payer: Self-pay

## 2022-12-19 ENCOUNTER — Inpatient Hospital Stay: Payer: Medicare Other | Attending: Hematology & Oncology

## 2022-12-19 ENCOUNTER — Other Ambulatory Visit: Payer: BLUE CROSS/BLUE SHIELD

## 2022-12-19 VITALS — BP 135/61 | HR 76 | Temp 98.8°F | Resp 18 | Ht 61.0 in | Wt 148.0 lb

## 2022-12-19 DIAGNOSIS — D631 Anemia in chronic kidney disease: Secondary | ICD-10-CM

## 2022-12-19 DIAGNOSIS — N189 Chronic kidney disease, unspecified: Secondary | ICD-10-CM | POA: Diagnosis not present

## 2022-12-19 DIAGNOSIS — N1831 Chronic kidney disease, stage 3a: Secondary | ICD-10-CM | POA: Insufficient documentation

## 2022-12-19 DIAGNOSIS — D500 Iron deficiency anemia secondary to blood loss (chronic): Secondary | ICD-10-CM

## 2022-12-19 LAB — CBC WITH DIFFERENTIAL (CANCER CENTER ONLY)
Abs Immature Granulocytes: 0.05 10*3/uL (ref 0.00–0.07)
Basophils Absolute: 0 10*3/uL (ref 0.0–0.1)
Basophils Relative: 0 %
Eosinophils Absolute: 0.1 10*3/uL (ref 0.0–0.5)
Eosinophils Relative: 1 %
HCT: 33.6 % — ABNORMAL LOW (ref 36.0–46.0)
Hemoglobin: 10.5 g/dL — ABNORMAL LOW (ref 12.0–15.0)
Immature Granulocytes: 0 %
Lymphocytes Relative: 13 %
Lymphs Abs: 1.5 10*3/uL (ref 0.7–4.0)
MCH: 30.7 pg (ref 26.0–34.0)
MCHC: 31.3 g/dL (ref 30.0–36.0)
MCV: 98.2 fL (ref 80.0–100.0)
Monocytes Absolute: 0.9 10*3/uL (ref 0.1–1.0)
Monocytes Relative: 7 %
Neutro Abs: 9.6 10*3/uL — ABNORMAL HIGH (ref 1.7–7.7)
Neutrophils Relative %: 79 %
Platelet Count: 211 10*3/uL (ref 150–400)
RBC: 3.42 MIL/uL — ABNORMAL LOW (ref 3.87–5.11)
RDW: 13.7 % (ref 11.5–15.5)
WBC Count: 12.3 10*3/uL — ABNORMAL HIGH (ref 4.0–10.5)
nRBC: 0 % (ref 0.0–0.2)

## 2022-12-19 LAB — RETICULOCYTES
Immature Retic Fract: 7.6 % (ref 2.3–15.9)
RBC.: 3.48 MIL/uL — ABNORMAL LOW (ref 3.87–5.11)
Retic Count, Absolute: 53.6 10*3/uL (ref 19.0–186.0)
Retic Ct Pct: 1.5 % (ref 0.4–3.1)

## 2022-12-19 LAB — CMP (CANCER CENTER ONLY)
ALT: 16 U/L (ref 0–44)
AST: 14 U/L — ABNORMAL LOW (ref 15–41)
Albumin: 4.1 g/dL (ref 3.5–5.0)
Alkaline Phosphatase: 36 U/L — ABNORMAL LOW (ref 38–126)
Anion gap: 10 (ref 5–15)
BUN: 45 mg/dL — ABNORMAL HIGH (ref 8–23)
CO2: 25 mmol/L (ref 22–32)
Calcium: 10.3 mg/dL (ref 8.9–10.3)
Chloride: 104 mmol/L (ref 98–111)
Creatinine: 1.53 mg/dL — ABNORMAL HIGH (ref 0.44–1.00)
GFR, Estimated: 37 mL/min — ABNORMAL LOW (ref >60)
Glucose, Bld: 198 mg/dL — ABNORMAL HIGH (ref 70–99)
Potassium: 5.7 mmol/L — ABNORMAL HIGH (ref 3.5–5.1)
Sodium: 139 mmol/L (ref 135–145)
Total Bilirubin: 0.3 mg/dL (ref 0.3–1.2)
Total Protein: 6.7 g/dL (ref 6.5–8.1)

## 2022-12-19 MED ORDER — DARBEPOETIN ALFA 300 MCG/0.6ML IJ SOSY
300.0000 ug | PREFILLED_SYRINGE | Freq: Once | INTRAMUSCULAR | Status: AC
  Administered 2022-12-19: 300 ug via SUBCUTANEOUS
  Filled 2022-12-19: qty 0.6

## 2022-12-19 NOTE — Patient Instructions (Signed)

## 2022-12-19 NOTE — Progress Notes (Signed)
Hematology and Oncology Follow Up Visit  Kelly Soto [id number] [birth year] 67 y.o. 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 is back for follow-up.  She and her family had a nice vacation at the [place].  They really enjoyed themselves..  She feels well.  She really has no complaints.  She has had no nausea or vomiting.  She has had no fatigue or weakness.  She is try to manage her blood sugars.  Back in April, her blood sugars were on the low side.  Her ferritin was 49 with an iron saturation of 10%.  She had 2 doses of Feraheme.  She has had no fever.  There is been no cough.  She has had no nausea or vomiting.  She has had no leg swelling.  Overall, I would say that her performance status by ECOG 1.     Medications:  Current Outpatient Medications:    amLODipine (NORVASC) 5 MG tablet, 5 mg daily., Disp: , Rfl:    Ascorbic Acid (VITAMIN C) 1000 MG tablet, Take 1,000 mg by mouth daily., Disp: , Rfl:    baclofen (LIORESAL) 10 MG tablet, 08/14/2022 For migraines., Disp: , Rfl:    betamethasone valerate (VALISONE) 0.1 % cream, Apply 1 application topically daily as needed (irritation)., Disp: , Rfl:    carvedilol (COREG) 25 MG tablet, Take 25 mg by mouth 2 (two) times daily with a meal., Disp: , Rfl:    chlorthalidone (HYGROTON) 25 MG tablet, Take 25 mg by mouth daily., Disp: , Rfl:    Continuous Blood Gluc Sensor (FREESTYLE LIBRE 2 SENSOR) MISC, USE AS DIRECTED TO CHECK BLOOD SUGAR FOR 14 DAYS, Disp: , Rfl:    denosumab (PROLIA) 60 MG/ML SOLN injection, Inject 60 mg into the skin every 6 (six) months. Administer in upper arm, thigh, or abdomen  Next injection is due 05-2016, Disp: , Rfl:    diazepam  (VALIUM) 5 MG tablet, Take 5 mg by mouth daily as needed for anxiety. , Disp: , Rfl: 0   empagliflozin (JARDIANCE) 10 MG TABS tablet, Take 10 mg by mouth daily., Disp: , Rfl:    fluocinonide (LIDEX) 0.05 % external solution, Apply 1 application. topically 2 (two) times daily., Disp: , Rfl:    fluticasone (FLONASE) 50 MCG/ACT nasal spray, Place 2 sprays into both nostrils at bedtime., Disp: , Rfl:    Galcanezumab-gnlm (EMGALITY) 120 MG/ML SOAJ, Inject 120 mg into the skin every 30 (thirty) days., Disp: , Rfl:    hydroxypropyl methylcellulose / hypromellose (ISOPTO TEARS / GONIOVISC) 2.5 % ophthalmic solution, Place 1 drop into both eyes 3 (three) times daily as needed for dry eyes., Disp: , Rfl:    Insulin Pen Needle (NOVOFINE PEN NEEDLE) 32G X 6 MM MISC, See admin instructions., Disp: , Rfl:    lansoprazole (PREVACID) 30 MG capsule, Take 30 mg by mouth 2 (two) times daily., Disp: , Rfl:    metFORMIN (GLUCOPHAGE) 500 MG tablet, Take 1,000 mg by mouth 2 (two) times daily with a meal., Disp: , Rfl:    metroNIDAZOLE (METROGEL) 0.75 % gel, Apply 1 application. topically 2 (two) times daily., Disp: , Rfl:    Multiple Vitamins-Minerals (CENTRUM SILVER 50+WOMEN) TABS, as directed  Orally, Disp: , Rfl:    pioglitazone (ACTOS) 45 MG tablet, Take 45 mg by mouth daily., Disp: , Rfl:    saccharomyces boulardii (FLORASTOR) 250 MG capsule, Take 250 mg by mouth 2 (two) times daily., Disp: , Rfl:    Simethicone (GAS FREE EXTRA STRENGTH PO), Take 2 tablets by mouth at bedtime., Disp: , Rfl:    sodium chloride (OCEAN) 0.65 % nasal spray, 2 sprays, Disp: , Rfl:    tobramycin (TOBREX) 0.3 % ophthalmic solution, Place 1 drop into both eyes See admin instructions. Instill one drop into the affected eye four times a day prior and the day after eye injection., Disp: , Rfl:    TRESIBA FLEXTOUCH 100 UNIT/ML SOPN FlexTouch Pen, Inject 12 Units into the skin daily., Disp: , Rfl:    triamcinolone (KENALOG) 0.025 % cream, Apply 1  application. topically 2 (two) times daily., Disp: , Rfl:   Allergies:  Allergies  Allergen Reactions   Ace Inhibitors Other (See Comments) and Anaphylaxis    Angioedema   Aspirin Other (See Comments)    Cannot take due to gastric bypass surgery   Farxiga [Dapagliflozin] Nausea And Vomiting and Other (See Comments)    Dizziness, tingling in upper extremities.   Nsaids Anaphylaxis and Other (See Comments)    Cannot take orally due to s/p gastric bypass   Sular [Nisoldipine Er] Other (See Comments)    angioedema   Nisoldipine Swelling and Other (See Comments)   Other Other (See Comments)   Boniva [Ibandronic Acid] Other (See Comments)    Joint pain    Past Medical History, Surgical history, Social history, and Family History were reviewed and updated.  Review of Systems: Review of Systems  Constitutional: Negative.   HENT: Negative.    Eyes: Negative.   Respiratory: Negative.    Cardiovascular: Negative.   Gastrointestinal: Negative.   Genitourinary: Negative.   Musculoskeletal: Negative.   Skin: Negative.   Neurological: Negative.   Endo/Heme/Allergies: Negative.   Psychiatric/Behavioral: Negative.      Physical Exam:  height is 5\' 1"  (1.549 m) and weight is 148 lb (67.1 kg). Her oral temperature is 98.8 F (37.1 C). Her blood pressure is 135/61 and her pulse is 76. Her respiration is 18 and oxygen saturation is 100%.   Physical Exam Vitals reviewed.  HENT:     Head: Normocephalic and atraumatic.  Eyes:     Pupils: Pupils are equal, round, and reactive to light.  Cardiovascular:     Rate and Rhythm: Normal rate and regular rhythm.     Heart sounds: Normal heart sounds.  Pulmonary:     Effort: Pulmonary effort is normal.     Breath sounds: Normal breath sounds.  Abdominal:     General: Bowel sounds are normal.     Palpations: Abdomen is soft.  Musculoskeletal:        General: No tenderness or deformity. Normal range of motion.     Cervical back: Normal range  of motion.  Lymphadenopathy:     Cervical: No cervical adenopathy.  Skin:    General: Skin is warm and dry.     Findings: No erythema or rash.  Neurological:     Mental Status: She is alert and oriented to person, place, and time.  Psychiatric:        Behavior: Behavior normal.        Thought Content: Thought content normal.        Judgment: Judgment normal.      Lab  Results  Component Value Date   WBC 12.3 (H) 12/19/2022   HGB 10.5 (L) 12/19/2022   HCT 33.6 (L) 12/19/2022   MCV 98.2 12/19/2022   PLT 211 12/19/2022     Chemistry      Component Value Date/Time   NA 139 11/07/2022 0742   NA 145 05/10/2017 0748   NA 137 12/06/2016 0746   K 5.2 (H) 11/07/2022 0742   K 4.7 05/10/2017 0748   K 5.2 (H) 12/06/2016 0746   CL 104 11/07/2022 0742   CL 110 (H) 05/10/2017 0748   CO2 27 11/07/2022 0742   CO2 23 05/10/2017 0748   CO2 23 12/06/2016 0746   BUN 44 (H) 11/07/2022 0742   BUN 28 (H) 05/10/2017 0748   BUN 26.1 (H) 12/06/2016 0746   CREATININE 1.49 (H) 11/07/2022 0742   CREATININE 1.2 05/10/2017 0748   CREATININE 1.2 (H) 12/06/2016 0746      Component Value Date/Time   CALCIUM 10.1 11/07/2022 0742   CALCIUM 9.1 05/10/2017 0748   CALCIUM 9.1 12/06/2016 0746   ALKPHOS 48 11/07/2022 0742   ALKPHOS 61 05/10/2017 0748   ALKPHOS 41 12/06/2016 0746   AST 17 11/07/2022 0742   AST 23 12/06/2016 0746   ALT 19 11/07/2022 0742   ALT 31 05/10/2017 0748   ALT 26 12/06/2016 0746   BILITOT 0.3 11/07/2022 0742   BILITOT 0.26 12/06/2016 0746       Impression and Plan: Ms. Garzon is 67 year old white female. She has diabetes. She has iron deficiency anemia. She has a very low erythropoietin level.  We will going give her dose of Aranesp today.  I know this was works for her.  Will see what her blood sugars are.  Her renal function has been trending upward.  She says that she has seen nephrology.  They do not appear to be too worried about her renal function.  They want  to see her back in a year.  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  I know this is why her potassium is on the higher side.  As always, we will plan to get her back in another 6 weeks.   Josph Macho, MD 6/25/20248:34 AM

## 2022-12-20 LAB — IRON AND IRON BINDING CAPACITY (CC-WL,HP ONLY)
Iron: 95 ug/dL (ref 28–170)
Saturation Ratios: 32 % — ABNORMAL HIGH (ref 10.4–31.8)
TIBC: 298 ug/dL (ref 250–450)
UIBC: 203 ug/dL (ref 148–442)

## 2022-12-20 LAB — FERRITIN: Ferritin: 759 ng/mL — ABNORMAL HIGH (ref 11–307)

## 2023-01-05 ENCOUNTER — Other Ambulatory Visit: Payer: Medicare Other

## 2023-01-30 ENCOUNTER — Inpatient Hospital Stay: Payer: Medicare Other | Attending: Hematology & Oncology

## 2023-01-30 ENCOUNTER — Inpatient Hospital Stay (HOSPITAL_BASED_OUTPATIENT_CLINIC_OR_DEPARTMENT_OTHER): Payer: Medicare Other | Admitting: Family

## 2023-01-30 ENCOUNTER — Inpatient Hospital Stay: Payer: Medicare Other

## 2023-01-30 ENCOUNTER — Encounter: Payer: Self-pay | Admitting: Family

## 2023-01-30 VITALS — BP 148/57 | HR 66 | Temp 98.0°F | Resp 17 | Wt 149.1 lb

## 2023-01-30 DIAGNOSIS — N189 Chronic kidney disease, unspecified: Secondary | ICD-10-CM

## 2023-01-30 DIAGNOSIS — N1831 Chronic kidney disease, stage 3a: Secondary | ICD-10-CM | POA: Insufficient documentation

## 2023-01-30 DIAGNOSIS — D500 Iron deficiency anemia secondary to blood loss (chronic): Secondary | ICD-10-CM

## 2023-01-30 DIAGNOSIS — D631 Anemia in chronic kidney disease: Secondary | ICD-10-CM

## 2023-01-30 LAB — CBC WITH DIFFERENTIAL (CANCER CENTER ONLY)
Abs Immature Granulocytes: 0.02 10*3/uL (ref 0.00–0.07)
Basophils Absolute: 0 10*3/uL (ref 0.0–0.1)
Basophils Relative: 1 %
Eosinophils Absolute: 0.1 10*3/uL (ref 0.0–0.5)
Eosinophils Relative: 2 %
HCT: 37.4 % (ref 36.0–46.0)
Hemoglobin: 11.5 g/dL — ABNORMAL LOW (ref 12.0–15.0)
Immature Granulocytes: 0 %
Lymphocytes Relative: 19 %
Lymphs Abs: 1.6 10*3/uL (ref 0.7–4.0)
MCH: 30.1 pg (ref 26.0–34.0)
MCHC: 30.7 g/dL (ref 30.0–36.0)
MCV: 97.9 fL (ref 80.0–100.0)
Monocytes Absolute: 0.7 10*3/uL (ref 0.1–1.0)
Monocytes Relative: 8 %
Neutro Abs: 5.9 10*3/uL (ref 1.7–7.7)
Neutrophils Relative %: 70 %
Platelet Count: 190 10*3/uL (ref 150–400)
RBC: 3.82 MIL/uL — ABNORMAL LOW (ref 3.87–5.11)
RDW: 13.7 % (ref 11.5–15.5)
WBC Count: 8.3 10*3/uL (ref 4.0–10.5)
nRBC: 0 % (ref 0.0–0.2)

## 2023-01-30 LAB — CMP (CANCER CENTER ONLY)
ALT: 18 U/L (ref 0–44)
AST: 18 U/L (ref 15–41)
Albumin: 4.1 g/dL (ref 3.5–5.0)
Alkaline Phosphatase: 37 U/L — ABNORMAL LOW (ref 38–126)
Anion gap: 9 (ref 5–15)
BUN: 36 mg/dL — ABNORMAL HIGH (ref 8–23)
CO2: 28 mmol/L (ref 22–32)
Calcium: 9.8 mg/dL (ref 8.9–10.3)
Chloride: 103 mmol/L (ref 98–111)
Creatinine: 1.29 mg/dL — ABNORMAL HIGH (ref 0.44–1.00)
GFR, Estimated: 45 mL/min — ABNORMAL LOW (ref >60)
Glucose, Bld: 149 mg/dL — ABNORMAL HIGH (ref 70–99)
Potassium: 5.1 mmol/L (ref 3.5–5.1)
Sodium: 140 mmol/L (ref 135–145)
Total Bilirubin: 0.3 mg/dL (ref 0.3–1.2)
Total Protein: 7 g/dL (ref 6.5–8.1)

## 2023-01-30 LAB — IRON AND IRON BINDING CAPACITY (CC-WL,HP ONLY)
Iron: 140 ug/dL (ref 28–170)
Saturation Ratios: 49 % — ABNORMAL HIGH (ref 10.4–31.8)
TIBC: 288 ug/dL (ref 250–450)
UIBC: 148 ug/dL (ref 148–442)

## 2023-01-30 LAB — FERRITIN: Ferritin: 539 ng/mL — ABNORMAL HIGH (ref 11–307)

## 2023-01-30 NOTE — Progress Notes (Signed)
Hematology and Oncology Follow Up Visit  Kelly Soto [id number] 1956/01/12 67 y.o. 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 She is doing well but notes a little fatigue now and then.  No issues with blood loss. No bruising or petechiae.  Hgb is stable at 11.5, MCV 97.  Her biggest issue at this time is trouble sleeping as well as GERD with n/v which she plans to discuss with her PCP Dr. Clelia Croft.  No fever, chills, cough, rash, dizziness, SOB, chest pain, palpitations, abdominal pain or changes in bowel or bladder habits.  No swelling or tenderness in her extremities.  Occasional numbness and tingling in her feet that comes and goes.  No falls or syncope reported.  Appetite and hydration are good. Weight is stable at 149 lbs.   ECOG Performance Status: 1 - Symptomatic but completely ambulatory  Medications:  Allergies as of 01/30/2023       Reactions   Ace Inhibitors Other (See Comments), Anaphylaxis   Angioedema   Aspirin Other (See Comments)   Cannot take due to gastric bypass surgery   Farxiga [dapagliflozin] Nausea And Vomiting, Other (See Comments)   Dizziness, tingling in upper extremities.   Nsaids Anaphylaxis, Other (See Comments)   Cannot take orally due to s/p gastric bypass   Sular [nisoldipine Er] Other (See Comments)   angioedema   Nisoldipine Swelling, Other (See Comments)   Other Other (See Comments)   Boniva [ibandronic Acid] Other (See Comments)   Joint pain        Medication List        Accurate as of January 30, 2023  8:55 AM. If you have any questions, ask your nurse or doctor.          amLODipine 5 MG tablet Commonly known as: NORVASC 5 mg daily.    baclofen 10 MG tablet Commonly known as: LIORESAL 08/14/2022 For migraines.   betamethasone valerate 0.1 % cream Commonly known as: VALISONE Apply 1 application topically daily as needed (irritation).   carvedilol 25 MG tablet Commonly known as: COREG Take 25 mg by mouth 2 (two) times daily with a meal.   Centrum Silver 50+Women Tabs as directed Orally   chlorthalidone 25 MG tablet Commonly known as: HYGROTON Take 25 mg by mouth daily.   denosumab 60 MG/ML Soln injection Commonly known as: PROLIA Inject 60 mg into the skin every 6 (six) months. Administer in upper arm, thigh, or abdomen  Next injection is due 05-2016   diazepam 5 MG tablet Commonly known as: VALIUM Take 5 mg by mouth daily as needed for anxiety.   Emgality 120 MG/ML Soaj Generic drug: Galcanezumab-gnlm Inject 120 mg into the skin every 30 (thirty) days.   fluocinonide 0.05 % external solution Commonly known as: LIDEX Apply 1 application. topically 2 (two) times daily.   fluticasone 50 MCG/ACT nasal spray Commonly known as: FLONASE Place 2 sprays into both nostrils at bedtime.   FreeStyle Libre 2 Sensor Misc USE AS DIRECTED TO CHECK BLOOD SUGAR FOR 14 DAYS   GAS FREE EXTRA STRENGTH PO Take 2 tablets by mouth at bedtime.   hydroxypropyl methylcellulose / hypromellose 2.5 % ophthalmic solution Commonly known as: ISOPTO  TEARS / GONIOVISC Place 1 drop into both eyes 3 (three) times daily as needed for dry eyes.   Jardiance 10 MG Tabs tablet Generic drug: empagliflozin Take 10 mg by mouth daily.   lansoprazole 30 MG capsule Commonly known as: PREVACID Take 30 mg by mouth 2 (two) times daily.   metFORMIN 500 MG tablet Commonly known as: GLUCOPHAGE Take 1,000 mg by mouth 2 (two) times daily with a meal.   metroNIDAZOLE 0.75 % gel Commonly known as: METROGEL Apply 1 application. topically 2 (two) times daily.   Novofine Pen Needle 32G X 6 MM Misc Generic drug: Insulin Pen Needle See admin  instructions.   pioglitazone 45 MG tablet Commonly known as: ACTOS Take 45 mg by mouth daily.   saccharomyces boulardii 250 MG capsule Commonly known as: FLORASTOR Take 250 mg by mouth 2 (two) times daily.   sodium chloride 0.65 % nasal spray Commonly known as: OCEAN 2 sprays   tobramycin 0.3 % ophthalmic solution Commonly known as: TOBREX Place 1 drop into both eyes See admin instructions. Instill one drop into the affected eye four times a day prior and the day after eye injection.   Evaristo Bury FlexTouch 100 UNIT/ML FlexTouch Pen Generic drug: insulin degludec Inject 12 Units into the skin daily.   triamcinolone 0.025 % cream Commonly known as: KENALOG Apply 1 application. topically 2 (two) times daily.   vitamin C 1000 MG tablet Take 1,000 mg by mouth daily.        Allergies:  Allergies  Allergen Reactions   Ace Inhibitors Other (See Comments) and Anaphylaxis    Angioedema   Aspirin Other (See Comments)    Cannot take due to gastric bypass surgery   Farxiga [Dapagliflozin] Nausea And Vomiting and Other (See Comments)    Dizziness, tingling in upper extremities.   Nsaids Anaphylaxis and Other (See Comments)    Cannot take orally due to s/p gastric bypass   Sular [Nisoldipine Er] Other (See Comments)    angioedema   Nisoldipine Swelling and Other (See Comments)   Other Other (See Comments)   Boniva [Ibandronic Acid] Other (See Comments)    Joint pain    Past Medical History, Surgical history, Social history, and Family History were reviewed and updated.  Review of Systems: All other 10 point review of systems is negative.   Physical Exam:  weight is 149 lb 1.9 oz (67.6 kg). Her oral temperature is 98 F (36.7 C). Her blood pressure is 148/57 (abnormal) and her pulse is 66. Her respiration is 17 and oxygen saturation is 100%.   Wt Readings from Last 3 Encounters:  01/30/23 149 lb 1.9 oz (67.6 kg)  12/19/22 148 lb (67.1 kg)  11/07/22 150 lb (68 kg)     Ocular: Sclerae unicteric, pupils equal, round and reactive to light Ear-nose-throat: Oropharynx clear, dentition fair Lymphatic: No cervical or supraclavicular adenopathy Lungs no rales or rhonchi, good excursion bilaterally Heart regular rate and rhythm, no murmur appreciated Abd soft, nontender, positive bowel sounds MSK no focal spinal tenderness, no joint edema Neuro: non-focal, well-oriented, appropriate affect Breasts: Deferred   Lab Results  Component Value Date   WBC 8.3 01/30/2023   HGB 11.5 (L) 01/30/2023   HCT 37.4 01/30/2023   MCV 97.9 01/30/2023   PLT 190 01/30/2023   Lab Results  Component Value Date   FERRITIN 759 (H) 12/19/2022   IRON 95 12/19/2022   TIBC 298 12/19/2022   UIBC 203 12/19/2022   IRONPCTSAT 32 (H) 12/19/2022  Lab Results  Component Value Date   RETICCTPCT 1.5 12/19/2022   RBC 3.82 (L) 01/30/2023   RETICCTABS 21.6 01/12/2015   No results found for: "KPAFRELGTCHN", "LAMBDASER", "KAPLAMBRATIO" No results found for: "IGGSERUM", "IGA", "IGMSERUM" Lab Results  Component Value Date   TOTALPROTELP 7.3 07/23/2013   ALBUMINELP 61.9 07/23/2013   A1GS 3.7 07/23/2013   A2GS 10.8 07/23/2013   BETS 7.0 07/23/2013   BETA2SER 3.8 07/23/2013   GAMS 12.8 07/23/2013   MSPIKE NOT DET 07/23/2013   SPEI * 07/23/2013     Chemistry      Component Value Date/Time   NA 139 12/19/2022 0756   NA 145 05/10/2017 0748   NA 137 12/06/2016 0746   K 5.7 (H) 12/19/2022 0756   K 4.7 05/10/2017 0748   K 5.2 (H) 12/06/2016 0746   CL 104 12/19/2022 0756   CL 110 (H) 05/10/2017 0748   CO2 25 12/19/2022 0756   CO2 23 05/10/2017 0748   CO2 23 12/06/2016 0746   BUN 45 (H) 12/19/2022 0756   BUN 28 (H) 05/10/2017 0748   BUN 26.1 (H) 12/06/2016 0746   CREATININE 1.53 (H) 12/19/2022 0756   CREATININE 1.2 05/10/2017 0748   CREATININE 1.2 (H) 12/06/2016 0746      Component Value Date/Time   CALCIUM 10.3 12/19/2022 0756   CALCIUM 9.1 05/10/2017 0748   CALCIUM  9.1 12/06/2016 0746   ALKPHOS 36 (L) 12/19/2022 0756   ALKPHOS 61 05/10/2017 0748   ALKPHOS 41 12/06/2016 0746   AST 14 (L) 12/19/2022 0756   AST 23 12/06/2016 0746   ALT 16 12/19/2022 0756   ALT 31 05/10/2017 0748   ALT 26 12/06/2016 0746   BILITOT 0.3 12/19/2022 0756   BILITOT 0.26 12/06/2016 0746       Impression and Plan: Kelly Soto is a very pleasant 67 yo caucasian female with multifactorial anemia.  No ESA needed this visit.  Iron studies pending.  Lab and injection in 6 weeks, follow-up in 3 months.   Eileen Stanford, NP 8/6/20248:55 AM

## 2023-02-21 ENCOUNTER — Other Ambulatory Visit (HOSPITAL_COMMUNITY): Payer: Self-pay | Admitting: Family Medicine

## 2023-02-21 DIAGNOSIS — R110 Nausea: Secondary | ICD-10-CM

## 2023-03-02 ENCOUNTER — Encounter (HOSPITAL_COMMUNITY): Payer: BLUE CROSS/BLUE SHIELD

## 2023-03-08 ENCOUNTER — Ambulatory Visit: Payer: BLUE CROSS/BLUE SHIELD

## 2023-03-13 ENCOUNTER — Inpatient Hospital Stay: Payer: Medicare Other | Attending: Hematology & Oncology

## 2023-03-13 ENCOUNTER — Inpatient Hospital Stay: Payer: Medicare Other

## 2023-03-13 VITALS — BP 146/66 | HR 66 | Temp 97.9°F | Resp 17

## 2023-03-13 DIAGNOSIS — N189 Chronic kidney disease, unspecified: Secondary | ICD-10-CM

## 2023-03-13 DIAGNOSIS — D500 Iron deficiency anemia secondary to blood loss (chronic): Secondary | ICD-10-CM

## 2023-03-13 DIAGNOSIS — D631 Anemia in chronic kidney disease: Secondary | ICD-10-CM | POA: Diagnosis present

## 2023-03-13 DIAGNOSIS — N1831 Chronic kidney disease, stage 3a: Secondary | ICD-10-CM | POA: Insufficient documentation

## 2023-03-13 LAB — CMP (CANCER CENTER ONLY)
ALT: 15 U/L (ref 0–44)
AST: 15 U/L (ref 15–41)
Albumin: 4.2 g/dL (ref 3.5–5.0)
Alkaline Phosphatase: 56 U/L (ref 38–126)
Anion gap: 9 (ref 5–15)
BUN: 41 mg/dL — ABNORMAL HIGH (ref 8–23)
CO2: 25 mmol/L (ref 22–32)
Calcium: 9.7 mg/dL (ref 8.9–10.3)
Chloride: 104 mmol/L (ref 98–111)
Creatinine: 1.34 mg/dL — ABNORMAL HIGH (ref 0.44–1.00)
GFR, Estimated: 43 mL/min — ABNORMAL LOW (ref >60)
Glucose, Bld: 170 mg/dL — ABNORMAL HIGH (ref 70–99)
Potassium: 5.3 mmol/L — ABNORMAL HIGH (ref 3.5–5.1)
Sodium: 138 mmol/L (ref 135–145)
Total Bilirubin: 0.2 mg/dL — ABNORMAL LOW (ref 0.3–1.2)
Total Protein: 6.8 g/dL (ref 6.5–8.1)

## 2023-03-13 LAB — CBC WITH DIFFERENTIAL (CANCER CENTER ONLY)
Abs Immature Granulocytes: 0.07 10*3/uL (ref 0.00–0.07)
Basophils Absolute: 0 10*3/uL (ref 0.0–0.1)
Basophils Relative: 1 %
Eosinophils Absolute: 0.1 10*3/uL (ref 0.0–0.5)
Eosinophils Relative: 1 %
HCT: 31 % — ABNORMAL LOW (ref 36.0–46.0)
Hemoglobin: 9.5 g/dL — ABNORMAL LOW (ref 12.0–15.0)
Immature Granulocytes: 1 %
Lymphocytes Relative: 21 %
Lymphs Abs: 1.5 10*3/uL (ref 0.7–4.0)
MCH: 29.8 pg (ref 26.0–34.0)
MCHC: 30.6 g/dL (ref 30.0–36.0)
MCV: 97.2 fL (ref 80.0–100.0)
Monocytes Absolute: 0.6 10*3/uL (ref 0.1–1.0)
Monocytes Relative: 9 %
Neutro Abs: 4.7 10*3/uL (ref 1.7–7.7)
Neutrophils Relative %: 67 %
Platelet Count: 227 10*3/uL (ref 150–400)
RBC: 3.19 MIL/uL — ABNORMAL LOW (ref 3.87–5.11)
RDW: 13.9 % (ref 11.5–15.5)
WBC Count: 7.1 10*3/uL (ref 4.0–10.5)
nRBC: 0 % (ref 0.0–0.2)

## 2023-03-13 LAB — IRON AND IRON BINDING CAPACITY (CC-WL,HP ONLY)
Iron: 114 ug/dL (ref 28–170)
Saturation Ratios: 37 % — ABNORMAL HIGH (ref 10.4–31.8)
TIBC: 305 ug/dL (ref 250–450)
UIBC: 191 ug/dL (ref 148–442)

## 2023-03-13 LAB — FERRITIN: Ferritin: 622 ng/mL — ABNORMAL HIGH (ref 11–307)

## 2023-03-13 MED ORDER — DARBEPOETIN ALFA 300 MCG/0.6ML IJ SOSY
300.0000 ug | PREFILLED_SYRINGE | Freq: Once | INTRAMUSCULAR | Status: AC
  Administered 2023-03-13: 300 ug via SUBCUTANEOUS
  Filled 2023-03-13: qty 0.6

## 2023-03-13 NOTE — Patient Instructions (Signed)

## 2023-03-16 ENCOUNTER — Other Ambulatory Visit (HOSPITAL_COMMUNITY): Payer: BLUE CROSS/BLUE SHIELD

## 2023-03-16 ENCOUNTER — Encounter (HOSPITAL_COMMUNITY): Payer: Self-pay

## 2023-03-21 ENCOUNTER — Ambulatory Visit: Payer: BLUE CROSS/BLUE SHIELD

## 2023-04-18 ENCOUNTER — Ambulatory Visit
Admission: RE | Admit: 2023-04-18 | Discharge: 2023-04-18 | Disposition: A | Discharge status: Home / Self Care | Payer: Medicare Other | Source: Ambulatory Visit | Attending: Family Medicine | Admitting: Family Medicine

## 2023-04-18 DIAGNOSIS — Z1231 Encounter for screening mammogram for malignant neoplasm of breast: Secondary | ICD-10-CM

## 2023-05-01 ENCOUNTER — Other Ambulatory Visit: Payer: Self-pay

## 2023-05-01 DIAGNOSIS — N189 Chronic kidney disease, unspecified: Secondary | ICD-10-CM

## 2023-05-02 ENCOUNTER — Inpatient Hospital Stay: Payer: Medicare Other

## 2023-05-02 ENCOUNTER — Encounter: Payer: Self-pay | Admitting: Hematology & Oncology

## 2023-05-02 ENCOUNTER — Inpatient Hospital Stay: Payer: Medicare Other | Attending: Hematology & Oncology | Admitting: Hematology & Oncology

## 2023-05-02 VITALS — BP 129/62 | HR 64 | Temp 98.3°F | Resp 20 | Ht 61.0 in | Wt 149.1 lb

## 2023-05-02 DIAGNOSIS — N189 Chronic kidney disease, unspecified: Secondary | ICD-10-CM | POA: Diagnosis not present

## 2023-05-02 DIAGNOSIS — D631 Anemia in chronic kidney disease: Secondary | ICD-10-CM

## 2023-05-02 DIAGNOSIS — Z79899 Other long term (current) drug therapy: Secondary | ICD-10-CM | POA: Diagnosis not present

## 2023-05-02 DIAGNOSIS — N183 Chronic kidney disease, stage 3 unspecified: Secondary | ICD-10-CM | POA: Diagnosis not present

## 2023-05-02 DIAGNOSIS — E119 Type 2 diabetes mellitus without complications: Secondary | ICD-10-CM | POA: Diagnosis not present

## 2023-05-02 DIAGNOSIS — D509 Iron deficiency anemia, unspecified: Secondary | ICD-10-CM | POA: Insufficient documentation

## 2023-05-02 DIAGNOSIS — N1831 Chronic kidney disease, stage 3a: Secondary | ICD-10-CM | POA: Insufficient documentation

## 2023-05-02 LAB — RETICULOCYTES
Immature Retic Fract: 3.3 % (ref 2.3–15.9)
RBC.: 3.95 MIL/uL (ref 3.87–5.11)
Retic Count, Absolute: 24.1 10*3/uL (ref 19.0–186.0)
Retic Ct Pct: 0.6 % (ref 0.4–3.1)

## 2023-05-02 LAB — CBC WITH DIFFERENTIAL (CANCER CENTER ONLY)
Abs Immature Granulocytes: 0.03 10*3/uL (ref 0.00–0.07)
Basophils Absolute: 0 10*3/uL (ref 0.0–0.1)
Basophils Relative: 1 %
Eosinophils Absolute: 0.1 10*3/uL (ref 0.0–0.5)
Eosinophils Relative: 1 %
HCT: 38.2 % (ref 36.0–46.0)
Hemoglobin: 11.9 g/dL — ABNORMAL LOW (ref 12.0–15.0)
Immature Granulocytes: 0 %
Lymphocytes Relative: 20 %
Lymphs Abs: 1.6 10*3/uL (ref 0.7–4.0)
MCH: 29.9 pg (ref 26.0–34.0)
MCHC: 31.2 g/dL (ref 30.0–36.0)
MCV: 96 fL (ref 80.0–100.0)
Monocytes Absolute: 0.6 10*3/uL (ref 0.1–1.0)
Monocytes Relative: 7 %
Neutro Abs: 5.5 10*3/uL (ref 1.7–7.7)
Neutrophils Relative %: 71 %
Platelet Count: 190 10*3/uL (ref 150–400)
RBC: 3.98 MIL/uL (ref 3.87–5.11)
RDW: 14.8 % (ref 11.5–15.5)
WBC Count: 7.9 10*3/uL (ref 4.0–10.5)
nRBC: 0 % (ref 0.0–0.2)

## 2023-05-02 LAB — IRON AND IRON BINDING CAPACITY (CC-WL,HP ONLY)
Iron: 141 ug/dL (ref 28–170)
Saturation Ratios: 46 % — ABNORMAL HIGH (ref 10.4–31.8)
TIBC: 305 ug/dL (ref 250–450)
UIBC: 164 ug/dL (ref 148–442)

## 2023-05-02 LAB — CMP (CANCER CENTER ONLY)
ALT: 18 U/L (ref 0–44)
AST: 20 U/L (ref 15–41)
Albumin: 4.5 g/dL (ref 3.5–5.0)
Alkaline Phosphatase: 44 U/L (ref 38–126)
Anion gap: 9 (ref 5–15)
BUN: 39 mg/dL — ABNORMAL HIGH (ref 8–23)
CO2: 28 mmol/L (ref 22–32)
Calcium: 10.3 mg/dL (ref 8.9–10.3)
Chloride: 103 mmol/L (ref 98–111)
Creatinine: 1.33 mg/dL — ABNORMAL HIGH (ref 0.44–1.00)
GFR, Estimated: 44 mL/min — ABNORMAL LOW (ref >60)
Glucose, Bld: 178 mg/dL — ABNORMAL HIGH (ref 70–99)
Potassium: 5.2 mmol/L — ABNORMAL HIGH (ref 3.5–5.1)
Sodium: 140 mmol/L (ref 135–145)
Total Bilirubin: 0.3 mg/dL (ref <1.2)
Total Protein: 7.2 g/dL (ref 6.5–8.1)

## 2023-05-02 LAB — FERRITIN: Ferritin: 564 ng/mL — ABNORMAL HIGH (ref 11–307)

## 2023-05-02 NOTE — Progress Notes (Signed)
Hematology and Oncology Follow Up Visit  Kelly Soto [id number] 04-Jan-1956 67 y.o. 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  She looks fantastic.  She feels good.  She is very happy about the election outcome.  She is looking forward to the holiday season.  She will be with her family.  Over last saw her, her iron studies showed a ferritin of 622 with an iron saturation of 37%.  She has had no bleeding or bruising.  There is no change in bowel or bladder habits.  She has had no cough or shortness of breath.  She has had no issues with COVID.  There is been some tingling in the legs.  This is mostly in the feet.  I suspect this probably is from diabetes.  Currently, I would say that her performance status is probably ECOG 1.  Medications:  Current Outpatient Medications:    amLODipine (NORVASC) 5 MG tablet, 5 mg daily., Disp: , Rfl:    Ascorbic Acid (VITAMIN C) 1000 MG tablet, Take 1,000 mg by mouth daily., Disp: , Rfl:    baclofen (LIORESAL) 10 MG tablet, 5 mg daily as needed. 08/14/2022 For migraines., Disp: , Rfl:    betamethasone valerate (VALISONE) 0.1 % cream, Apply 1 application topically daily as needed (irritation)., Disp: , Rfl:    carvedilol (COREG) 25 MG tablet, Take 25 mg by mouth 2 (two) times daily with a meal., Disp: , Rfl:    chlorthalidone (HYGROTON) 25 MG tablet, Take 25 mg by mouth daily., Disp: , Rfl:    Continuous Blood Gluc Sensor (FREESTYLE LIBRE 2 SENSOR) MISC, USE AS DIRECTED TO CHECK BLOOD SUGAR FOR 14 DAYS, Disp: , Rfl:    denosumab (PROLIA) 60 MG/ML SOLN injection, Inject 60 mg into the skin every 6 (six) months. Administer in upper arm, thigh, or  abdomen  Next injection is due 05-2016, Disp: , Rfl:    diazepam (VALIUM) 5 MG tablet, Take 5 mg by mouth 2 (two) times daily as needed for anxiety., Disp: , Rfl: 0   empagliflozin (JARDIANCE) 10 MG TABS tablet, Take 10 mg by mouth daily., Disp: , Rfl:    fluocinonide (LIDEX) 0.05 % external solution, Apply 1 application. topically 2 (two) times daily., Disp: , Rfl:    fluticasone (FLONASE) 50 MCG/ACT nasal spray, Place 2 sprays into both nostrils at bedtime., Disp: , Rfl:    Galcanezumab-gnlm (EMGALITY) 120 MG/ML SOAJ, Inject 120 mg into the skin every 30 (thirty) days., Disp: , Rfl:    hydroxypropyl methylcellulose / hypromellose (ISOPTO TEARS / GONIOVISC) 2.5 % ophthalmic solution, Place 1 drop into both eyes 3 (three) times daily as needed for dry eyes., Disp: , Rfl:    Insulin Pen Needle (NOVOFINE PEN NEEDLE) 32G X 6 MM MISC, See admin instructions., Disp: , Rfl:    lansoprazole (PREVACID) 30 MG capsule, Take 30 mg by mouth 2 (two) times daily., Disp: , Rfl:    metFORMIN (GLUCOPHAGE) 500 MG tablet, Take 1,000 mg by mouth 2 (two) times daily with a meal., Disp: , Rfl:    metroNIDAZOLE (METROGEL) 0.75 % gel, Apply 1 application. topically 2 (two) times daily., Disp: , Rfl:  Multiple Vitamins-Minerals (CENTRUM SILVER 50+WOMEN) TABS, as directed Orally, Disp: , Rfl:    pioglitazone (ACTOS) 45 MG tablet, Take 45 mg by mouth daily., Disp: , Rfl:    psyllium (METAMUCIL) 58.6 % powder, Take 1 packet by mouth daily., Disp: , Rfl:    saccharomyces boulardii (FLORASTOR) 250 MG capsule, Take 250 mg by mouth 2 (two) times daily., Disp: , Rfl:    Simethicone (GAS FREE EXTRA STRENGTH PO), Take 2 tablets by mouth at bedtime., Disp: , Rfl:    sodium chloride (OCEAN) 0.65 % nasal spray, 2 sprays, Disp: , Rfl:    TRESIBA FLEXTOUCH 100 UNIT/ML SOPN FlexTouch Pen, Inject 14 Units into the skin daily., Disp: , Rfl:    triamcinolone (KENALOG) 0.025 % cream, Apply 1 application. topically 2 (two) times daily.,  Disp: , Rfl:    tobramycin (TOBREX) 0.3 % ophthalmic solution, Place 1 drop into both eyes See admin instructions. Instill one drop into the affected eye four times a day prior and the day after eye injection. (Patient not taking: Reported on 05/02/2023), Disp: , Rfl:   Allergies:  Allergies  Allergen Reactions   Ace Inhibitors Other (See Comments) and Anaphylaxis    Angioedema   Aspirin Other (See Comments)    Cannot take due to gastric bypass surgery   Farxiga [Dapagliflozin] Nausea And Vomiting and Other (See Comments)    Dizziness, tingling in upper extremities.   Nsaids Anaphylaxis and Other (See Comments)    Cannot take orally due to s/p gastric bypass   Sular [Nisoldipine Er] Other (See Comments)    angioedema   Nisoldipine Swelling and Other (See Comments)   Other Other (See Comments)   Boniva [Ibandronic Acid] Other (See Comments)    Joint pain    Past Medical History, Surgical history, Social history, and Family History were reviewed and updated.  Review of Systems: Review of Systems  Constitutional: Negative.   HENT: Negative.    Eyes: Negative.   Respiratory: Negative.    Cardiovascular: Negative.   Gastrointestinal: Negative.   Genitourinary: Negative.   Musculoskeletal: Negative.   Skin: Negative.   Neurological: Negative.   Endo/Heme/Allergies: Negative.   Psychiatric/Behavioral: Negative.      Physical Exam:  height is 5\' 1"  (1.549 m) and weight is 149 lb 1.3 oz (67.6 kg). Her oral temperature is 98.3 F (36.8 C). Her blood pressure is 144/71 (abnormal) and her pulse is 64. Her respiration is 20 and oxygen saturation is 100%.   Physical Exam Vitals reviewed.  HENT:     Head: Normocephalic and atraumatic.  Eyes:     Pupils: Pupils are equal, round, and reactive to light.  Cardiovascular:     Rate and Rhythm: Normal rate and regular rhythm.     Heart sounds: Normal heart sounds.  Pulmonary:     Effort: Pulmonary effort is normal.     Breath sounds:  Normal breath sounds.  Abdominal:     General: Bowel sounds are normal.     Palpations: Abdomen is soft.  Musculoskeletal:        General: No tenderness or deformity. Normal range of motion.     Cervical back: Normal range of motion.  Lymphadenopathy:     Cervical: No cervical adenopathy.  Skin:    General: Skin is warm and dry.     Findings: No erythema or rash.  Neurological:     Mental Status: She is alert and oriented to person, place, and time.  Psychiatric:  Behavior: Behavior normal.        Thought Content: Thought content normal.        Judgment: Judgment normal.      Lab Results  Component Value Date   WBC 7.9 05/02/2023   HGB 11.9 (L) 05/02/2023   HCT 38.2 05/02/2023   MCV 96.0 05/02/2023   PLT 190 05/02/2023     Chemistry      Component Value Date/Time   NA 140 05/02/2023 0907   NA 145 05/10/2017 0748   NA 137 12/06/2016 0746   K 5.2 (H) 05/02/2023 0907   K 4.7 05/10/2017 0748   K 5.2 (H) 12/06/2016 0746   CL 103 05/02/2023 0907   CL 110 (H) 05/10/2017 0748   CO2 28 05/02/2023 0907   CO2 23 05/10/2017 0748   CO2 23 12/06/2016 0746   BUN 39 (H) 05/02/2023 0907   BUN 28 (H) 05/10/2017 0748   BUN 26.1 (H) 12/06/2016 0746   CREATININE 1.33 (H) 05/02/2023 0907   CREATININE 1.2 05/10/2017 0748   CREATININE 1.2 (H) 12/06/2016 0746      Component Value Date/Time   CALCIUM 10.3 05/02/2023 0907   CALCIUM 9.1 05/10/2017 0748   CALCIUM 9.1 12/06/2016 0746   ALKPHOS 44 05/02/2023 0907   ALKPHOS 61 05/10/2017 0748   ALKPHOS 41 12/06/2016 0746   AST 20 05/02/2023 0907   AST 23 12/06/2016 0746   ALT 18 05/02/2023 0907   ALT 31 05/10/2017 0748   ALT 26 12/06/2016 0746   BILITOT 0.3 05/02/2023 0907   BILITOT 0.26 12/06/2016 0746       Impression and Plan: Kelly Soto is 67 year old white female. She has diabetes. She has iron deficiency anemia. She has a very low erythropoietin level.  She clearly does not need any Aranesp today.  We will see  what her iron levels look like.  However, the MCV is okay.  As always, I think her long-term issues will be the diabetes.  I know she is trying her best to help keep the blood sugars under good control.  We will plan to get her back to see [place] probably right around Christmas.  At that time, I suspect she probably will need Aranesp.   Josph Macho, MD 11/6/20249:55 AM

## 2023-06-13 ENCOUNTER — Inpatient Hospital Stay: Payer: Medicare Other

## 2023-06-13 ENCOUNTER — Inpatient Hospital Stay (HOSPITAL_BASED_OUTPATIENT_CLINIC_OR_DEPARTMENT_OTHER): Payer: Medicare Other | Admitting: Medical Oncology

## 2023-06-13 ENCOUNTER — Encounter: Payer: Self-pay | Admitting: Medical Oncology

## 2023-06-13 ENCOUNTER — Inpatient Hospital Stay: Payer: Medicare Other | Attending: Hematology & Oncology

## 2023-06-13 VITALS — BP 134/59 | HR 65 | Temp 98.1°F | Resp 18 | Ht 61.0 in | Wt 149.0 lb

## 2023-06-13 DIAGNOSIS — D631 Anemia in chronic kidney disease: Secondary | ICD-10-CM | POA: Insufficient documentation

## 2023-06-13 DIAGNOSIS — N183 Chronic kidney disease, stage 3 unspecified: Secondary | ICD-10-CM

## 2023-06-13 DIAGNOSIS — D500 Iron deficiency anemia secondary to blood loss (chronic): Secondary | ICD-10-CM

## 2023-06-13 DIAGNOSIS — N1831 Chronic kidney disease, stage 3a: Secondary | ICD-10-CM | POA: Diagnosis present

## 2023-06-13 LAB — CMP (CANCER CENTER ONLY)
ALT: 17 U/L (ref 0–44)
AST: 17 U/L (ref 15–41)
Albumin: 4.3 g/dL (ref 3.5–5.0)
Alkaline Phosphatase: 40 U/L (ref 38–126)
Anion gap: 11 (ref 5–15)
BUN: 35 mg/dL — ABNORMAL HIGH (ref 8–23)
CO2: 25 mmol/L (ref 22–32)
Calcium: 9.7 mg/dL (ref 8.9–10.3)
Chloride: 105 mmol/L (ref 98–111)
Creatinine: 1.3 mg/dL — ABNORMAL HIGH (ref 0.44–1.00)
GFR, Estimated: 45 mL/min — ABNORMAL LOW (ref >60)
Glucose, Bld: 155 mg/dL — ABNORMAL HIGH (ref 70–99)
Potassium: 5.2 mmol/L — ABNORMAL HIGH (ref 3.5–5.1)
Sodium: 141 mmol/L (ref 135–145)
Total Bilirubin: 0.2 mg/dL (ref <1.2)
Total Protein: 7.1 g/dL (ref 6.5–8.1)

## 2023-06-13 LAB — CBC WITH DIFFERENTIAL (CANCER CENTER ONLY)
Abs Immature Granulocytes: 0.09 10*3/uL — ABNORMAL HIGH (ref 0.00–0.07)
Basophils Absolute: 0 10*3/uL (ref 0.0–0.1)
Basophils Relative: 1 %
Eosinophils Absolute: 0.1 10*3/uL (ref 0.0–0.5)
Eosinophils Relative: 2 %
HCT: 32.9 % — ABNORMAL LOW (ref 36.0–46.0)
Hemoglobin: 10.3 g/dL — ABNORMAL LOW (ref 12.0–15.0)
Immature Granulocytes: 1 %
Lymphocytes Relative: 22 %
Lymphs Abs: 1.7 10*3/uL (ref 0.7–4.0)
MCH: 30.6 pg (ref 26.0–34.0)
MCHC: 31.3 g/dL (ref 30.0–36.0)
MCV: 97.6 fL (ref 80.0–100.0)
Monocytes Absolute: 0.7 10*3/uL (ref 0.1–1.0)
Monocytes Relative: 9 %
Neutro Abs: 5.1 10*3/uL (ref 1.7–7.7)
Neutrophils Relative %: 65 %
Platelet Count: 211 10*3/uL (ref 150–400)
RBC: 3.37 MIL/uL — ABNORMAL LOW (ref 3.87–5.11)
RDW: 14.4 % (ref 11.5–15.5)
WBC Count: 7.8 10*3/uL (ref 4.0–10.5)
nRBC: 0 % (ref 0.0–0.2)

## 2023-06-13 LAB — IRON AND IRON BINDING CAPACITY (CC-WL,HP ONLY)
Iron: 77 ug/dL (ref 28–170)
Saturation Ratios: 23 % (ref 10.4–31.8)
TIBC: 337 ug/dL (ref 250–450)
UIBC: 260 ug/dL (ref 148–442)

## 2023-06-13 LAB — RETICULOCYTES
Immature Retic Fract: 10.3 % (ref 2.3–15.9)
RBC.: 3.42 MIL/uL — ABNORMAL LOW (ref 3.87–5.11)
Retic Count, Absolute: 56.4 10*3/uL (ref 19.0–186.0)
Retic Ct Pct: 1.7 % (ref 0.4–3.1)

## 2023-06-13 LAB — FERRITIN: Ferritin: 573 ng/mL — ABNORMAL HIGH (ref 11–307)

## 2023-06-13 MED ORDER — DARBEPOETIN ALFA 300 MCG/0.6ML IJ SOSY
300.0000 ug | PREFILLED_SYRINGE | Freq: Once | INTRAMUSCULAR | Status: AC
  Administered 2023-06-13: 300 ug via SUBCUTANEOUS
  Filled 2023-06-13: qty 0.6

## 2023-06-13 NOTE — Patient Instructions (Signed)

## 2023-06-13 NOTE — Progress Notes (Signed)
Hematology and Oncology Follow Up Visit  Kelly Soto [id number] 30-May-1956 67 y.o. 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  Lutts is back for follow-up.   She has been doing well. She has had some fatigue but other than that she is doing well.   Over last saw her, her iron studies showed a ferritin of 564 with an iron saturation of 46%.  She has had no bleeding or bruising.  There is no change in bowel or bladder habits.  She has had no cough or shortness of breath.  She has had no issues with COVID.  Currently, I would say that her performance status is probably ECOG 1.  Wt Readings from Last 3 Encounters:  06/13/23 149 lb (67.6 kg)  05/02/23 149 lb 1.3 oz (67.6 kg)  01/30/23 149 lb 1.9 oz (67.6 kg)   Medications:  Current Outpatient Medications:    amLODipine (NORVASC) 5 MG tablet, 5 mg daily., Disp: , Rfl:    Ascorbic Acid (VITAMIN C) 500 MG CAPS, Take 1 capsule by mouth daily., Disp: , Rfl:    baclofen (LIORESAL) 10 MG tablet, 5 mg daily as needed. 08/14/2022 For migraines., Disp: , Rfl:    betamethasone valerate (VALISONE) 0.1 % cream, Apply 1 application topically daily as needed (irritation)., Disp: , Rfl:    carvedilol (COREG) 25 MG tablet, Take 25 mg by mouth 2 (two) times daily with a meal., Disp: , Rfl:    chlorthalidone (HYGROTON) 25 MG tablet, Take 25 mg by mouth daily., Disp: , Rfl:    Continuous Blood Gluc Sensor (FREESTYLE LIBRE 2 SENSOR) MISC, USE AS DIRECTED TO CHECK BLOOD SUGAR FOR 14 DAYS, Disp: , Rfl:    denosumab (PROLIA) 60 MG/ML SOLN injection, Inject 60 mg into the skin every 6 (six) months. Administer in upper arm, thigh, or abdomen  Next injection is due 05-2016, Disp: ,  Rfl:    diazepam (VALIUM) 5 MG tablet, Take 5 mg by mouth 2 (two) times daily as needed for anxiety., Disp: , Rfl: 0   empagliflozin (JARDIANCE) 10 MG TABS tablet, Take 10 mg by mouth daily., Disp: , Rfl:    fluocinonide (LIDEX) 0.05 % external solution, Apply 1 application. topically 2 (two) times daily., Disp: , Rfl:    fluticasone (FLONASE) 50 MCG/ACT nasal spray, Place 2 sprays into both nostrils at bedtime., Disp: , Rfl:    Galcanezumab-gnlm (EMGALITY) 120 MG/ML SOAJ, Inject 120 mg into the skin every 30 (thirty) days., Disp: , Rfl:    hydroxypropyl methylcellulose / hypromellose (ISOPTO TEARS / GONIOVISC) 2.5 % ophthalmic solution, Place 1 drop into both eyes 3 (three) times daily as needed for dry eyes., Disp: , Rfl:    Insulin Pen Needle (NOVOFINE PEN NEEDLE) 32G X 6 MM MISC, See admin instructions., Disp: , Rfl:    lansoprazole (PREVACID) 30 MG capsule, Take 30 mg by mouth 2 (two) times daily., Disp: , Rfl:    metFORMIN (GLUCOPHAGE) 500 MG tablet, Take 1,000 mg by mouth 2 (two) times daily with a meal., Disp: , Rfl:    metroNIDAZOLE (METROGEL) 0.75 % gel, Apply 1 application. topically 2 (two) times daily., Disp: , Rfl:    Multiple Vitamins-Minerals (CENTRUM SILVER 50+WOMEN) TABS,  as directed Orally, Disp: , Rfl:    pioglitazone (ACTOS) 45 MG tablet, Take 45 mg by mouth daily., Disp: , Rfl:    psyllium (METAMUCIL) 58.6 % powder, Take 1 packet by mouth daily., Disp: , Rfl:    saccharomyces boulardii (FLORASTOR) 250 MG capsule, Take 250 mg by mouth 2 (two) times daily., Disp: , Rfl:    Simethicone (GAS FREE EXTRA STRENGTH PO), Take 2 tablets by mouth at bedtime., Disp: , Rfl:    sodium chloride (OCEAN) 0.65 % nasal spray, 2 sprays, Disp: , Rfl:    tobramycin (TOBREX) 0.3 % ophthalmic solution, Place 1 drop into both eyes See admin instructions. Instill one drop into the affected eye four times a day prior and the day after eye injection., Disp: , Rfl:    TRESIBA FLEXTOUCH 100 UNIT/ML SOPN  FlexTouch Pen, Inject 14 Units into the skin daily., Disp: , Rfl:    triamcinolone (KENALOG) 0.025 % cream, Apply 1 application. topically 2 (two) times daily., Disp: , Rfl:  No current facility-administered medications for this visit.  Facility-Administered Medications Ordered in Other Visits:    Darbepoetin Alfa (ARANESP) injection 300 mcg, 300 mcg, Subcutaneous, Once, Ennever, Rose Phi, MD  Allergies:  Allergies  Allergen Reactions   Ace Inhibitors Other (See Comments) and Anaphylaxis    Angioedema   Aspirin Other (See Comments)    Cannot take due to gastric bypass surgery   Farxiga [Dapagliflozin] Nausea And Vomiting and Other (See Comments)    Dizziness, tingling in upper extremities.   Nsaids Anaphylaxis and Other (See Comments)    Cannot take orally due to s/p gastric bypass   Sular [Nisoldipine Er] Other (See Comments)    angioedema   Nisoldipine Swelling and Other (See Comments)   Other Other (See Comments)   Boniva [Ibandronic Acid] Other (See Comments)    Joint pain    Past Medical History, Surgical history, Social history, and Family History were reviewed and updated.  Review of Systems: Review of Systems  Constitutional: Negative.   HENT: Negative.    Eyes: Negative.   Respiratory: Negative.    Cardiovascular: Negative.   Gastrointestinal: Negative.   Genitourinary: Negative.   Musculoskeletal: Negative.   Skin: Negative.   Neurological: Negative.   Endo/Heme/Allergies: Negative.   Psychiatric/Behavioral: Negative.      Physical Exam:  height is 5\' 1"  (1.549 m) and weight is 149 lb (67.6 kg). Her oral temperature is 98.1 F (36.7 C). Her blood pressure is 134/59 (abnormal) and her pulse is 65. Her respiration is 18 and oxygen saturation is 100%.   Physical Exam Vitals reviewed.  HENT:     Head: Normocephalic and atraumatic.  Eyes:     Pupils: Pupils are equal, round, and reactive to light.  Cardiovascular:     Rate and Rhythm: Normal rate and regular  rhythm.     Heart sounds: Normal heart sounds.  Pulmonary:     Effort: Pulmonary effort is normal.     Breath sounds: Normal breath sounds.  Abdominal:     General: Bowel sounds are normal.     Palpations: Abdomen is soft.  Musculoskeletal:        General: No tenderness or deformity. Normal range of motion.     Cervical back: Normal range of motion.  Lymphadenopathy:     Cervical: No cervical adenopathy.  Skin:    General: Skin is warm and dry.     Findings: No erythema or rash.  Neurological:     Mental  Status: She is alert and oriented to person, place, and time.  Psychiatric:        Behavior: Behavior normal.        Thought Content: Thought content normal.        Judgment: Judgment normal.      Lab Results  Component Value Date   WBC 7.8 06/13/2023   HGB 10.3 (L) 06/13/2023   HCT 32.9 (L) 06/13/2023   MCV 97.6 06/13/2023   PLT 211 06/13/2023     Chemistry      Component Value Date/Time   NA 141 06/13/2023 0850   NA 145 05/10/2017 0748   NA 137 12/06/2016 0746   K 5.2 (H) 06/13/2023 0850   K 4.7 05/10/2017 0748   K 5.2 (H) 12/06/2016 0746   CL 105 06/13/2023 0850   CL 110 (H) 05/10/2017 0748   CO2 25 06/13/2023 0850   CO2 23 05/10/2017 0748   CO2 23 12/06/2016 0746   BUN 35 (H) 06/13/2023 0850   BUN 28 (H) 05/10/2017 0748   BUN 26.1 (H) 12/06/2016 0746   CREATININE 1.30 (H) 06/13/2023 0850   CREATININE 1.2 05/10/2017 0748   CREATININE 1.2 (H) 12/06/2016 0746      Component Value Date/Time   CALCIUM 9.7 06/13/2023 0850   CALCIUM 9.1 05/10/2017 0748   CALCIUM 9.1 12/06/2016 0746   ALKPHOS 40 06/13/2023 0850   ALKPHOS 61 05/10/2017 0748   ALKPHOS 41 12/06/2016 0746   AST 17 06/13/2023 0850   AST 23 12/06/2016 0746   ALT 17 06/13/2023 0850   ALT 31 05/10/2017 0748   ALT 26 12/06/2016 0746   BILITOT 0.2 06/13/2023 0850   BILITOT 0.26 12/06/2016 0746     Encounter Diagnoses  Name Primary?   Chronic renal impairment, stage 3 (moderate), unspecified  whether stage 3a or 3b CKD (HCC) Yes   Iron deficiency anemia due to chronic blood loss     Impression and Plan: Ms. Flikkema is 67 year old white female. She has diabetes. She has iron deficiency anemia. She has a very low erythropoietin level.  ESA today RTC 2 months MD, labs (CBC, CMP, iron, ferritin, retic), ESA  Rushie Chestnut, PA-C 12/18/20249:52 AM

## 2023-07-05 ENCOUNTER — Ambulatory Visit
Admission: RE | Admit: 2023-07-05 | Discharge: 2023-07-05 | Disposition: A | Discharge status: Home / Self Care | Payer: Medicare Other | Source: Ambulatory Visit | Attending: Family Medicine | Admitting: Family Medicine

## 2023-07-05 DIAGNOSIS — M810 Age-related osteoporosis without current pathological fracture: Secondary | ICD-10-CM

## 2023-08-14 ENCOUNTER — Inpatient Hospital Stay: Payer: Medicare Other | Admitting: Hematology & Oncology

## 2023-08-14 ENCOUNTER — Inpatient Hospital Stay: Payer: Medicare Other

## 2023-08-14 ENCOUNTER — Inpatient Hospital Stay: Payer: Medicare Other | Attending: Hematology & Oncology

## 2023-08-14 DIAGNOSIS — N1831 Chronic kidney disease, stage 3a: Secondary | ICD-10-CM | POA: Insufficient documentation

## 2023-08-14 DIAGNOSIS — D631 Anemia in chronic kidney disease: Secondary | ICD-10-CM | POA: Insufficient documentation

## 2023-08-20 ENCOUNTER — Encounter: Payer: Self-pay | Admitting: Hematology & Oncology

## 2023-08-20 ENCOUNTER — Inpatient Hospital Stay (HOSPITAL_BASED_OUTPATIENT_CLINIC_OR_DEPARTMENT_OTHER): Payer: Medicare Other | Admitting: Hematology & Oncology

## 2023-08-20 ENCOUNTER — Inpatient Hospital Stay: Payer: Medicare Other

## 2023-08-20 VITALS — BP 122/68 | HR 70 | Temp 98.9°F | Resp 20 | Ht 61.0 in | Wt 146.1 lb

## 2023-08-20 DIAGNOSIS — Z9884 Bariatric surgery status: Secondary | ICD-10-CM | POA: Diagnosis not present

## 2023-08-20 DIAGNOSIS — N189 Chronic kidney disease, unspecified: Secondary | ICD-10-CM | POA: Diagnosis not present

## 2023-08-20 DIAGNOSIS — N182 Chronic kidney disease, stage 2 (mild): Secondary | ICD-10-CM

## 2023-08-20 DIAGNOSIS — N1831 Chronic kidney disease, stage 3a: Secondary | ICD-10-CM | POA: Diagnosis present

## 2023-08-20 DIAGNOSIS — D631 Anemia in chronic kidney disease: Secondary | ICD-10-CM

## 2023-08-20 DIAGNOSIS — N183 Chronic kidney disease, stage 3 unspecified: Secondary | ICD-10-CM

## 2023-08-20 DIAGNOSIS — D500 Iron deficiency anemia secondary to blood loss (chronic): Secondary | ICD-10-CM

## 2023-08-20 LAB — CMP (CANCER CENTER ONLY)
ALT: 15 U/L (ref 0–44)
AST: 16 U/L (ref 15–41)
Albumin: 4.1 g/dL (ref 3.5–5.0)
Alkaline Phosphatase: 40 U/L (ref 38–126)
Anion gap: 9 (ref 5–15)
BUN: 37 mg/dL — ABNORMAL HIGH (ref 8–23)
CO2: 25 mmol/L (ref 22–32)
Calcium: 10 mg/dL (ref 8.9–10.3)
Chloride: 105 mmol/L (ref 98–111)
Creatinine: 1.2 mg/dL — ABNORMAL HIGH (ref 0.44–1.00)
GFR, Estimated: 49 mL/min — ABNORMAL LOW
Glucose, Bld: 138 mg/dL — ABNORMAL HIGH (ref 70–99)
Potassium: 4.7 mmol/L (ref 3.5–5.1)
Sodium: 139 mmol/L (ref 135–145)
Total Bilirubin: 0.3 mg/dL (ref 0.0–1.2)
Total Protein: 6.6 g/dL (ref 6.5–8.1)

## 2023-08-20 LAB — CBC
HCT: 32.7 % — ABNORMAL LOW (ref 36.0–46.0)
Hemoglobin: 10.5 g/dL — ABNORMAL LOW (ref 12.0–15.0)
MCH: 30.6 pg (ref 26.0–34.0)
MCHC: 32.1 g/dL (ref 30.0–36.0)
MCV: 95.3 fL (ref 80.0–100.0)
Platelets: 216 10*3/uL (ref 150–400)
RBC: 3.43 MIL/uL — ABNORMAL LOW (ref 3.87–5.11)
RDW: 13.7 % (ref 11.5–15.5)
WBC: 9.4 10*3/uL (ref 4.0–10.5)
nRBC: 0 % (ref 0.0–0.2)

## 2023-08-20 LAB — IRON AND IRON BINDING CAPACITY (CC-WL,HP ONLY)
Iron: 101 ug/dL (ref 28–170)
Saturation Ratios: 34 % — ABNORMAL HIGH (ref 10.4–31.8)
TIBC: 294 ug/dL (ref 250–450)
UIBC: 193 ug/dL (ref 148–442)

## 2023-08-20 LAB — RETIC PANEL
Immature Retic Fract: 5.7 % (ref 2.3–15.9)
RBC.: 3.54 MIL/uL — ABNORMAL LOW (ref 3.87–5.11)
Retic Count, Absolute: 40.4 10*3/uL (ref 19.0–186.0)
Retic Ct Pct: 1.1 % (ref 0.4–3.1)
Reticulocyte Hemoglobin: 34.4 pg

## 2023-08-20 LAB — FERRITIN: Ferritin: 593 ng/mL — ABNORMAL HIGH (ref 11–307)

## 2023-08-20 MED ORDER — METOCLOPRAMIDE HCL 10 MG PO TABS: 10.0000 mg | ORAL_TABLET | Freq: Two times a day (BID) | ORAL | 3 refills | Status: DC

## 2023-08-20 MED ORDER — DARBEPOETIN ALFA 300 MCG/0.6ML IJ SOSY
300.0000 ug | PREFILLED_SYRINGE | Freq: Once | INTRAMUSCULAR | Status: AC
  Administered 2023-08-20: 300 ug via SUBCUTANEOUS
  Filled 2023-08-20: qty 0.6

## 2023-08-20 NOTE — Progress Notes (Signed)
 Hematology and Oncology Follow Up Visit  Kelly Soto [id number] Jun 12, 1956 68 y.o. 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  Kelly Soto is back for follow-up.  She looks fantastic.  She feels good.  She is not happy about the cold weather we had last week.  She is looking forward to Spring.  She will like to have warm weather.  Only last saw her back in December, her ferritin was 573 with an iron saturation of 23%.  Blood sugars seem to be doing better.  I know she is quite diligent with her blood sugar control.  She has had no change in bowel or bladder habits.  Her biggest problem is acid reflux.  She has horrible acid reflux.  She is on Prevacid.  She says this really is not working.  She has to sleep almost sitting up.  I think Reglan would not be a bad idea for her.  It would not surprise me if she has a little bit of gastroparesis from diabetes.  She has had no problems with bleeding.  Has been no leg swelling.  She has had no rashes.  Overall, I would say that her performance status is probably ECOG 1.    Medications:  Current Outpatient Medications:    amLODipine (NORVASC) 5 MG tablet, 5 mg daily., Disp: , Rfl:    Ascorbic Acid (VITAMIN C) 500 MG CAPS, Take 1 capsule by mouth daily., Disp: , Rfl:    baclofen (LIORESAL) 10 MG tablet, 5 mg daily as needed. 08/14/2022 For migraines., Disp: , Rfl:    carvedilol (COREG) 25 MG tablet, Take 25 mg by mouth 2 (two) times daily with a meal., Disp: , Rfl:    chlorthalidone (HYGROTON) 25 MG tablet, Take 25 mg by mouth daily., Disp: , Rfl:    Continuous Blood Gluc Sensor (FREESTYLE LIBRE 2 SENSOR) MISC, USE AS DIRECTED TO CHECK BLOOD SUGAR FOR 14 DAYS, Disp: ,  Rfl:    denosumab (PROLIA) 60 MG/ML SOLN injection, Inject 60 mg into the skin every 6 (six) months. Administer in upper arm, thigh, or abdomen  Next injection is due 05-2016, Disp: , Rfl:    diazepam (VALIUM) 5 MG tablet, Take 5 mg by mouth 2 (two) times daily as needed for anxiety., Disp: , Rfl: 0   empagliflozin (JARDIANCE) 10 MG TABS tablet, Take 10 mg by mouth daily., Disp: , Rfl:    fluocinonide (LIDEX) 0.05 % external solution, Apply 1 application. topically 2 (two) times daily., Disp: , Rfl:    fluticasone (FLONASE) 50 MCG/ACT nasal spray, Place 2 sprays into both nostrils at bedtime., Disp: , Rfl:    Galcanezumab-gnlm (EMGALITY) 120 MG/ML SOAJ, Inject 120 mg into the skin every 30 (thirty) days., Disp: , Rfl:    hydroxypropyl methylcellulose / hypromellose (ISOPTO TEARS / GONIOVISC) 2.5 % ophthalmic solution, Place 1 drop into both eyes 3 (three) times daily as needed for dry eyes., Disp: , Rfl:    Insulin Pen Needle (NOVOFINE PEN NEEDLE) 32G X 6 MM MISC, See admin instructions., Disp: , Rfl:    lansoprazole (PREVACID) 30 MG capsule, Take 30 mg by mouth 2 (two) times daily., Disp: , Rfl:    metFORMIN (GLUCOPHAGE) 500 MG tablet,  Take 1,000 mg by mouth 2 (two) times daily with a meal., Disp: , Rfl:    metroNIDAZOLE (METROGEL) 0.75 % gel, Apply 1 application. topically 2 (two) times daily., Disp: , Rfl:    Multiple Vitamins-Minerals (CENTRUM SILVER 50+WOMEN) TABS, as directed Orally, Disp: , Rfl:    pioglitazone (ACTOS) 45 MG tablet, Take 45 mg by mouth daily., Disp: , Rfl:    psyllium (METAMUCIL) 58.6 % powder, Take 1 packet by mouth daily., Disp: , Rfl:    saccharomyces boulardii (FLORASTOR) 250 MG capsule, Take 250 mg by mouth in the morning, at noon, and at bedtime., Disp: , Rfl:    Simethicone (GAS FREE EXTRA STRENGTH PO), Take 2 tablets by mouth at bedtime., Disp: , Rfl:    sodium chloride (OCEAN) 0.65 % nasal spray, 2 sprays, Disp: , Rfl:    tobramycin (TOBREX) 0.3 % ophthalmic  solution, Place 1 drop into both eyes See admin instructions. Instill one drop into the affected eye four times a day prior and the day after eye injection., Disp: , Rfl:    TRESIBA FLEXTOUCH 100 UNIT/ML SOPN FlexTouch Pen, Inject 13 Units into the skin daily., Disp: , Rfl:    triamcinolone (KENALOG) 0.025 % cream, Apply 1 application. topically 2 (two) times daily., Disp: , Rfl:    betamethasone valerate (VALISONE) 0.1 % cream, Apply 1 application topically daily as needed (irritation). (Patient not taking: Reported on 08/20/2023), Disp: , Rfl:  No current facility-administered medications for this visit.  Facility-Administered Medications Ordered in Other Visits:    Darbepoetin Alfa (ARANESP) injection 300 mcg, 300 mcg, Subcutaneous, Once, Doylene Splinter, Rose Phi, MD  Allergies:  Allergies  Allergen Reactions   Ace Inhibitors Other (See Comments) and Anaphylaxis    Angioedema   Aspirin Other (See Comments)    Cannot take due to gastric bypass surgery   Farxiga [Dapagliflozin] Nausea And Vomiting and Other (See Comments)    Dizziness, tingling in upper extremities.   Nsaids Anaphylaxis and Other (See Comments)    Cannot take orally due to s/p gastric bypass   Sular [Nisoldipine Er] Other (See Comments)    angioedema   Nisoldipine Swelling and Other (See Comments)   Other Other (See Comments)   Boniva [Ibandronate] Other (See Comments)    Joint pain    Past Medical History, Surgical history, Social history, and Family History were reviewed and updated.  Review of Systems: Review of Systems  Constitutional: Negative.   HENT: Negative.    Eyes: Negative.   Respiratory: Negative.    Cardiovascular: Negative.   Gastrointestinal: Negative.   Genitourinary: Negative.   Musculoskeletal: Negative.   Skin: Negative.   Neurological: Negative.   Endo/Heme/Allergies: Negative.   Psychiatric/Behavioral: Negative.      Physical Exam:  height is 5\' 1"  (1.549 m) and weight is 146 lb 1.9 oz  (66.3 kg). Her oral temperature is 98.9 F (37.2 C). Her blood pressure is 122/68 and her pulse is 70. Her respiration is 20 and oxygen saturation is 100%.   Physical Exam Vitals reviewed.  HENT:     Head: Normocephalic and atraumatic.  Eyes:     Pupils: Pupils are equal, round, and reactive to light.  Cardiovascular:     Rate and Rhythm: Normal rate and regular rhythm.     Heart sounds: Normal heart sounds.  Pulmonary:     Effort: Pulmonary effort is normal.     Breath sounds: Normal breath sounds.  Abdominal:     General: Bowel sounds are normal.  Palpations: Abdomen is soft.  Musculoskeletal:        General: No tenderness or deformity. Normal range of motion.     Cervical back: Normal range of motion.  Lymphadenopathy:     Cervical: No cervical adenopathy.  Skin:    General: Skin is warm and dry.     Findings: No erythema or rash.  Neurological:     Mental Status: She is alert and oriented to person, place, and time.  Psychiatric:        Behavior: Behavior normal.        Thought Content: Thought content normal.        Judgment: Judgment normal.      Lab Results  Component Value Date   WBC 9.4 08/20/2023   HGB 10.5 (L) 08/20/2023   HCT 32.7 (L) 08/20/2023   MCV 95.3 08/20/2023   PLT 216 08/20/2023     Chemistry      Component Value Date/Time   NA 139 08/20/2023 0821   NA 145 05/10/2017 0748   NA 137 12/06/2016 0746   K 4.7 08/20/2023 0821   K 4.7 05/10/2017 0748   K 5.2 (H) 12/06/2016 0746   CL 105 08/20/2023 0821   CL 110 (H) 05/10/2017 0748   CO2 25 08/20/2023 0821   CO2 23 05/10/2017 0748   CO2 23 12/06/2016 0746   BUN 37 (H) 08/20/2023 0821   BUN 28 (H) 05/10/2017 0748   BUN 26.1 (H) 12/06/2016 0746   CREATININE 1.20 (H) 08/20/2023 0821   CREATININE 1.2 05/10/2017 0748   CREATININE 1.2 (H) 12/06/2016 0746      Component Value Date/Time   CALCIUM 10.0 08/20/2023 0821   CALCIUM 9.1 05/10/2017 0748   CALCIUM 9.1 12/06/2016 0746   ALKPHOS 40  08/20/2023 0821   ALKPHOS 61 05/10/2017 0748   ALKPHOS 41 12/06/2016 0746   AST 16 08/20/2023 0821   AST 23 12/06/2016 0746   ALT 15 08/20/2023 0821   ALT 31 05/10/2017 0748   ALT 26 12/06/2016 0746   BILITOT 0.3 08/20/2023 0821   BILITOT 0.26 12/06/2016 0746       Impression and Plan: Ms. Mcmurphy is 68 year old white female. She has diabetes. She has iron deficiency anemia. She has a very low erythropoietin level.  We will go ahead and give her Aranesp today.  We will see what her iron levels are.  I would like to get her back in about 6 weeks.  By then, the weather should be warmer for her.   Josph Macho, MD 2/24/202510:07 AM

## 2023-08-20 NOTE — Patient Instructions (Signed)

## 2023-08-29 ENCOUNTER — Other Ambulatory Visit (HOSPITAL_COMMUNITY): Payer: Self-pay | Admitting: Family Medicine

## 2023-08-29 DIAGNOSIS — K3184 Gastroparesis: Secondary | ICD-10-CM

## 2023-09-12 ENCOUNTER — Ambulatory Visit (HOSPITAL_COMMUNITY)
Admission: RE | Admit: 2023-09-12 | Discharge: 2023-09-12 | Disposition: A | Discharge status: Home / Self Care | Source: Ambulatory Visit | Attending: Family Medicine | Admitting: Family Medicine

## 2023-09-12 DIAGNOSIS — K3184 Gastroparesis: Secondary | ICD-10-CM | POA: Insufficient documentation

## 2023-09-12 MED ORDER — TECHNETIUM TC 99M SULFUR COLLOID
2.2000 | Freq: Once | INTRAVENOUS | Status: AC
  Administered 2023-09-12: 2.2 via ORAL

## 2023-10-01 ENCOUNTER — Other Ambulatory Visit: Payer: Self-pay

## 2023-10-01 ENCOUNTER — Inpatient Hospital Stay: Payer: BLUE CROSS/BLUE SHIELD

## 2023-10-01 ENCOUNTER — Inpatient Hospital Stay (HOSPITAL_BASED_OUTPATIENT_CLINIC_OR_DEPARTMENT_OTHER): Payer: BLUE CROSS/BLUE SHIELD | Admitting: Hematology & Oncology

## 2023-10-01 ENCOUNTER — Inpatient Hospital Stay: Payer: Medicare Other | Attending: Hematology & Oncology

## 2023-10-01 VITALS — BP 144/74 | HR 65 | Temp 98.1°F | Resp 18 | Ht 61.0 in | Wt 157.0 lb

## 2023-10-01 DIAGNOSIS — E119 Type 2 diabetes mellitus without complications: Secondary | ICD-10-CM | POA: Insufficient documentation

## 2023-10-01 DIAGNOSIS — N189 Chronic kidney disease, unspecified: Secondary | ICD-10-CM | POA: Diagnosis not present

## 2023-10-01 DIAGNOSIS — D631 Anemia in chronic kidney disease: Secondary | ICD-10-CM | POA: Diagnosis not present

## 2023-10-01 DIAGNOSIS — D500 Iron deficiency anemia secondary to blood loss (chronic): Secondary | ICD-10-CM

## 2023-10-01 DIAGNOSIS — D509 Iron deficiency anemia, unspecified: Secondary | ICD-10-CM | POA: Diagnosis present

## 2023-10-01 DIAGNOSIS — Z794 Long term (current) use of insulin: Secondary | ICD-10-CM | POA: Diagnosis not present

## 2023-10-01 DIAGNOSIS — N182 Chronic kidney disease, stage 2 (mild): Secondary | ICD-10-CM

## 2023-10-01 DIAGNOSIS — Z79899 Other long term (current) drug therapy: Secondary | ICD-10-CM | POA: Insufficient documentation

## 2023-10-01 DIAGNOSIS — Z9884 Bariatric surgery status: Secondary | ICD-10-CM

## 2023-10-01 LAB — CBC WITH DIFFERENTIAL (CANCER CENTER ONLY)
Abs Immature Granulocytes: 0.02 10*3/uL (ref 0.00–0.07)
Basophils Absolute: 0 10*3/uL (ref 0.0–0.1)
Basophils Relative: 1 %
Eosinophils Absolute: 0.2 10*3/uL (ref 0.0–0.5)
Eosinophils Relative: 3 %
HCT: 36.3 % (ref 36.0–46.0)
Hemoglobin: 11.3 g/dL — ABNORMAL LOW (ref 12.0–15.0)
Immature Granulocytes: 0 %
Lymphocytes Relative: 23 %
Lymphs Abs: 1.7 10*3/uL (ref 0.7–4.0)
MCH: 29.7 pg (ref 26.0–34.0)
MCHC: 31.1 g/dL (ref 30.0–36.0)
MCV: 95.3 fL (ref 80.0–100.0)
Monocytes Absolute: 0.6 10*3/uL (ref 0.1–1.0)
Monocytes Relative: 9 %
Neutro Abs: 4.7 10*3/uL (ref 1.7–7.7)
Neutrophils Relative %: 64 %
Platelet Count: 192 10*3/uL (ref 150–400)
RBC: 3.81 MIL/uL — ABNORMAL LOW (ref 3.87–5.11)
RDW: 15.4 % (ref 11.5–15.5)
WBC Count: 7.3 10*3/uL (ref 4.0–10.5)
nRBC: 0 % (ref 0.0–0.2)

## 2023-10-01 LAB — IRON AND IRON BINDING CAPACITY (CC-WL,HP ONLY)
Iron: 132 ug/dL (ref 28–170)
Saturation Ratios: 38 % — ABNORMAL HIGH (ref 10.4–31.8)
TIBC: 346 ug/dL (ref 250–450)
UIBC: 214 ug/dL (ref 148–442)

## 2023-10-01 LAB — CMP (CANCER CENTER ONLY)
ALT: 25 U/L (ref 0–44)
AST: 21 U/L (ref 15–41)
Albumin: 4 g/dL (ref 3.5–5.0)
Alkaline Phosphatase: 77 U/L (ref 38–126)
Anion gap: 8 (ref 5–15)
BUN: 47 mg/dL — ABNORMAL HIGH (ref 8–23)
CO2: 27 mmol/L (ref 22–32)
Calcium: 10.1 mg/dL (ref 8.9–10.3)
Chloride: 105 mmol/L (ref 98–111)
Creatinine: 1.52 mg/dL — ABNORMAL HIGH (ref 0.44–1.00)
GFR, Estimated: 37 mL/min — ABNORMAL LOW (ref >60)
Glucose, Bld: 184 mg/dL — ABNORMAL HIGH (ref 70–99)
Potassium: 5.1 mmol/L (ref 3.5–5.1)
Sodium: 140 mmol/L (ref 135–145)
Total Bilirubin: 0.3 mg/dL (ref 0.0–1.2)
Total Protein: 6.7 g/dL (ref 6.5–8.1)

## 2023-10-01 LAB — RETICULOCYTES
Immature Retic Fract: 3.1 % (ref 2.3–15.9)
RBC.: 3.78 MIL/uL — ABNORMAL LOW (ref 3.87–5.11)
Retic Count, Absolute: 21.5 10*3/uL (ref 19.0–186.0)
Retic Ct Pct: 0.6 % (ref 0.4–3.1)

## 2023-10-01 LAB — FERRITIN: Ferritin: 546 ng/mL — ABNORMAL HIGH (ref 11–307)

## 2023-10-01 NOTE — Progress Notes (Signed)
 Hematology and Oncology Follow Up Visit  Kelly Soto [id number] 04-21-1956 68 y.o. 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 is back for follow-up.  She looks fantastic.  She feels good.    She is taking insulin.  Her diabetes medicines are being rearranged.  She has been taking off some of them.  I think insulin is probably can be how she is going be treated.  Thankfully, she does not need any Aranesp today.  I am very happy for her.  When we last saw her, her ferritin was 593 with an iron saturation of 34%.  She has had no change in bowel or bladder habits.  She has had no cough.  There has been no issues with COVID.  She has had no leg swelling.  She has had no neuropathy.  She is still enjoying her dog.  He is 59 years old.  Overall, I would say that her performance status is ECOG 1.    Medications:  Current Outpatient Medications:    JARDIANCE 25 MG TABS tablet, Take 25 mg by mouth daily., Disp: , Rfl:    NOVOLOG FLEXPEN 100 UNIT/ML FlexPen, Inject into the skin., Disp: , Rfl:    Romosozumab-aqqg (EVENITY [place]), Inject into the skin every 30 (thirty) days., Disp: , Rfl:    amLODipine (NORVASC) 5 MG tablet, 5 mg daily., Disp: , Rfl:    Ascorbic Acid (VITAMIN C) 500 MG CAPS, Take 1 capsule by mouth daily., Disp: , Rfl:    baclofen (LIORESAL) 10 MG tablet, 5 mg daily as needed. 08/14/2022 For migraines., Disp: , Rfl:    betamethasone valerate (VALISONE) 0.1 % cream, Apply 1 application topically daily as needed (irritation). (Patient not taking: Reported on 08/20/2023), Disp: , Rfl:    carvedilol (COREG) 25 MG tablet, Take 25 mg by mouth 2 (two) times daily with a meal., Disp: , Rfl:     chlorthalidone (HYGROTON) 25 MG tablet, Take 25 mg by mouth daily., Disp: , Rfl:    Continuous Blood Gluc Sensor (FREESTYLE LIBRE 2 SENSOR) MISC, USE AS DIRECTED TO CHECK BLOOD SUGAR FOR 14 DAYS, Disp: , Rfl:    diazepam (VALIUM) 5 MG tablet, Take 5 mg by mouth 2 (two) times daily as needed for anxiety., Disp: , Rfl: 0   fluocinonide (LIDEX) 0.05 % external solution, Apply 1 application. topically 2 (two) times daily., Disp: , Rfl:    fluticasone (FLONASE) 50 MCG/ACT nasal spray, Place 2 sprays into both nostrils at bedtime., Disp: , Rfl:    Galcanezumab-gnlm (EMGALITY) 120 MG/ML SOAJ, Inject 120 mg into the skin every 30 (thirty) days., Disp: , Rfl:    hydroxypropyl methylcellulose / hypromellose (ISOPTO TEARS / GONIOVISC) 2.5 % ophthalmic solution, Place 1 drop into both eyes 3 (three) times daily as needed for dry eyes., Disp: , Rfl:    Insulin Pen Needle (NOVOFINE PEN NEEDLE) 32G X 6 MM MISC, See admin instructions., Disp: , Rfl:    lansoprazole (PREVACID) 30 MG capsule, Take 30 mg by mouth 2 (two) times daily., Disp: , Rfl:    metoCLOPramide (REGLAN) 10 MG tablet, Take 1 tablet (10 mg total) by mouth every 12 (twelve) hours. Take 10 mg about half hour  before lunch and then take 10 mg at bedtime., Disp: 60 tablet, Rfl: 3   metroNIDAZOLE (METROGEL) 0.75 % gel, Apply 1 application. topically 2 (two) times daily., Disp: , Rfl:    Multiple Vitamins-Minerals (CENTRUM SILVER 50+WOMEN) TABS, as directed Orally, Disp: , Rfl:    pioglitazone (ACTOS) 45 MG tablet, Take 45 mg by mouth daily., Disp: , Rfl:    saccharomyces boulardii (FLORASTOR) 250 MG capsule, Take 250 mg by mouth in the morning, at noon, and at bedtime., Disp: , Rfl:    Simethicone (GAS FREE EXTRA STRENGTH PO), Take 2 tablets by mouth at bedtime., Disp: , Rfl:    sodium chloride (OCEAN) 0.65 % nasal spray, 2 sprays, Disp: , Rfl:    tobramycin (TOBREX) 0.3 % ophthalmic solution, Place 1 drop into both eyes See admin instructions. Instill one  drop into the affected eye four times a day prior and the day after eye injection., Disp: , Rfl:    TRESIBA FLEXTOUCH 100 UNIT/ML SOPN FlexTouch Pen, Inject 13 Units into the skin daily., Disp: , Rfl:    triamcinolone (KENALOG) 0.025 % cream, Apply 1 application. topically 2 (two) times daily., Disp: , Rfl:   Allergies:  Allergies  Allergen Reactions   Ace Inhibitors Other (See Comments) and Anaphylaxis    Angioedema   Aspirin Other (See Comments)    Cannot take due to gastric bypass surgery   Farxiga [Dapagliflozin] Nausea And Vomiting and Other (See Comments)    Dizziness, tingling in upper extremities.   Nsaids Anaphylaxis and Other (See Comments)    Cannot take orally due to s/p gastric bypass   Sular [Nisoldipine Er] Other (See Comments)    angioedema   Nisoldipine Swelling and Other (See Comments)   Other Other (See Comments)   Boniva [Ibandronate] Other (See Comments)    Joint pain    Past Medical History, Surgical history, Social history, and Family History were reviewed and updated.  Review of Systems: Review of Systems  Constitutional: Negative.   HENT: Negative.    Eyes: Negative.   Respiratory: Negative.    Cardiovascular: Negative.   Gastrointestinal: Negative.   Genitourinary: Negative.   Musculoskeletal: Negative.   Skin: Negative.   Neurological: Negative.   Endo/Heme/Allergies: Negative.   Psychiatric/Behavioral: Negative.      Physical Exam:  height is 5\' 1"  (1.549 m) and weight is 157 lb (71.2 kg). Her oral temperature is 98.1 F (36.7 C). Her blood pressure is 144/74 (abnormal) and her pulse is 65. Her respiration is 18 and oxygen saturation is 100%.   Physical Exam Vitals reviewed.  HENT:     Head: Normocephalic and atraumatic.  Eyes:     Pupils: Pupils are equal, round, and reactive to light.  Cardiovascular:     Rate and Rhythm: Normal rate and regular rhythm.     Heart sounds: Normal heart sounds.  Pulmonary:     Effort: Pulmonary effort  is normal.     Breath sounds: Normal breath sounds.  Abdominal:     General: Bowel sounds are normal.     Palpations: Abdomen is soft.  Musculoskeletal:        General: No tenderness or deformity. Normal range of motion.     Cervical back: Normal range of motion.  Lymphadenopathy:     Cervical: No cervical adenopathy.  Skin:    General: Skin is warm and dry.     Findings: No erythema or rash.  Neurological:     Mental Status: She is alert  and oriented to person, place, and time.  Psychiatric:        Behavior: Behavior normal.        Thought Content: Thought content normal.        Judgment: Judgment normal.     Lab Results  Component Value Date   WBC 7.3 10/01/2023   HGB 11.3 (L) 10/01/2023   HCT 36.3 10/01/2023   MCV 95.3 10/01/2023   PLT 192 10/01/2023     Chemistry      Component Value Date/Time   NA 140 10/01/2023 0920   NA 145 05/10/2017 0748   NA 137 12/06/2016 0746   K 5.1 10/01/2023 0920   K 4.7 05/10/2017 0748   K 5.2 (H) 12/06/2016 0746   CL 105 10/01/2023 0920   CL 110 (H) 05/10/2017 0748   CO2 27 10/01/2023 0920   CO2 23 05/10/2017 0748   CO2 23 12/06/2016 0746   BUN 47 (H) 10/01/2023 0920   BUN 28 (H) 05/10/2017 0748   BUN 26.1 (H) 12/06/2016 0746   CREATININE 1.52 (H) 10/01/2023 0920   CREATININE 1.2 05/10/2017 0748   CREATININE 1.2 (H) 12/06/2016 0746      Component Value Date/Time   CALCIUM 10.1 10/01/2023 0920   CALCIUM 9.1 05/10/2017 0748   CALCIUM 9.1 12/06/2016 0746   ALKPHOS 77 10/01/2023 0920   ALKPHOS 61 05/10/2017 0748   ALKPHOS 41 12/06/2016 0746   AST 21 10/01/2023 0920   AST 23 12/06/2016 0746   ALT 25 10/01/2023 0920   ALT 31 05/10/2017 0748   ALT 26 12/06/2016 0746   BILITOT 0.3 10/01/2023 0920   BILITOT 0.26 12/06/2016 0746       Impression and Plan: Ms. Creely is 68 year old white female. She has diabetes. She has iron deficiency anemia. She has a very low erythropoietin level.  I am happy with how things look.   Will plan to get her back in another 6 weeks.  I think this is a good interval for [place].   Josph Macho, MD 4/7/202510:12 AM

## 2023-11-01 ENCOUNTER — Ambulatory Visit: Admitting: Orthopedic Surgery

## 2023-11-13 ENCOUNTER — Inpatient Hospital Stay: Attending: Hematology & Oncology

## 2023-11-13 ENCOUNTER — Inpatient Hospital Stay (HOSPITAL_BASED_OUTPATIENT_CLINIC_OR_DEPARTMENT_OTHER): Admitting: Medical Oncology

## 2023-11-13 ENCOUNTER — Inpatient Hospital Stay

## 2023-11-13 ENCOUNTER — Encounter: Payer: Self-pay | Admitting: Medical Oncology

## 2023-11-13 VITALS — BP 154/61 | HR 65 | Temp 97.9°F | Resp 18 | Ht 61.0 in | Wt 161.0 lb

## 2023-11-13 DIAGNOSIS — N189 Chronic kidney disease, unspecified: Secondary | ICD-10-CM

## 2023-11-13 DIAGNOSIS — I10 Essential (primary) hypertension: Secondary | ICD-10-CM

## 2023-11-13 DIAGNOSIS — N182 Chronic kidney disease, stage 2 (mild): Secondary | ICD-10-CM

## 2023-11-13 DIAGNOSIS — N1831 Chronic kidney disease, stage 3a: Secondary | ICD-10-CM | POA: Diagnosis not present

## 2023-11-13 DIAGNOSIS — D500 Iron deficiency anemia secondary to blood loss (chronic): Secondary | ICD-10-CM

## 2023-11-13 DIAGNOSIS — D631 Anemia in chronic kidney disease: Secondary | ICD-10-CM | POA: Insufficient documentation

## 2023-11-13 DIAGNOSIS — Z9884 Bariatric surgery status: Secondary | ICD-10-CM

## 2023-11-13 LAB — CBC WITH DIFFERENTIAL (CANCER CENTER ONLY)
Abs Immature Granulocytes: 0.02 10*3/uL (ref 0.00–0.07)
Basophils Absolute: 0 10*3/uL (ref 0.0–0.1)
Basophils Relative: 1 %
Eosinophils Absolute: 0.1 10*3/uL (ref 0.0–0.5)
Eosinophils Relative: 2 %
HCT: 30.7 % — ABNORMAL LOW (ref 36.0–46.0)
Hemoglobin: 9.6 g/dL — ABNORMAL LOW (ref 12.0–15.0)
Immature Granulocytes: 0 %
Lymphocytes Relative: 16 %
Lymphs Abs: 1.3 10*3/uL (ref 0.7–4.0)
MCH: 29.7 pg (ref 26.0–34.0)
MCHC: 31.3 g/dL (ref 30.0–36.0)
MCV: 95 fL (ref 80.0–100.0)
Monocytes Absolute: 0.7 10*3/uL (ref 0.1–1.0)
Monocytes Relative: 8 %
Neutro Abs: 6.2 10*3/uL (ref 1.7–7.7)
Neutrophils Relative %: 73 %
Platelet Count: 231 10*3/uL (ref 150–400)
RBC: 3.23 MIL/uL — ABNORMAL LOW (ref 3.87–5.11)
RDW: 15.1 % (ref 11.5–15.5)
WBC Count: 8.3 10*3/uL (ref 4.0–10.5)
nRBC: 0 % (ref 0.0–0.2)

## 2023-11-13 LAB — CMP (CANCER CENTER ONLY)
ALT: 23 U/L (ref 0–44)
AST: 21 U/L (ref 15–41)
Albumin: 4.6 g/dL (ref 3.5–5.0)
Alkaline Phosphatase: 116 U/L (ref 38–126)
Anion gap: 8 (ref 5–15)
BUN: 64 mg/dL — ABNORMAL HIGH (ref 8–23)
CO2: 25 mmol/L (ref 22–32)
Calcium: 10.1 mg/dL (ref 8.9–10.3)
Chloride: 105 mmol/L (ref 98–111)
Creatinine: 1.98 mg/dL — ABNORMAL HIGH (ref 0.44–1.00)
GFR, Estimated: 27 mL/min — ABNORMAL LOW (ref >60)
Glucose, Bld: 107 mg/dL — ABNORMAL HIGH (ref 70–99)
Potassium: 4.8 mmol/L (ref 3.5–5.1)
Sodium: 138 mmol/L (ref 135–145)
Total Bilirubin: 0.2 mg/dL (ref 0.0–1.2)
Total Protein: 7 g/dL (ref 6.5–8.1)

## 2023-11-13 LAB — IRON AND IRON BINDING CAPACITY (CC-WL,HP ONLY)
Iron: 87 ug/dL (ref 28–170)
Saturation Ratios: 24 % (ref 10.4–31.8)
TIBC: 365 ug/dL (ref 250–450)
UIBC: 278 ug/dL (ref 148–442)

## 2023-11-13 LAB — RETICULOCYTES
Immature Retic Fract: 4.9 % (ref 2.3–15.9)
RBC.: 3.24 MIL/uL — ABNORMAL LOW (ref 3.87–5.11)
Retic Count, Absolute: 64.8 10*3/uL (ref 19.0–186.0)
Retic Ct Pct: 2 % (ref 0.4–3.1)

## 2023-11-13 LAB — FERRITIN: Ferritin: 916 ng/mL — ABNORMAL HIGH (ref 11–307)

## 2023-11-13 MED ORDER — DARBEPOETIN ALFA 300 MCG/0.6ML IJ SOSY
300.0000 ug | PREFILLED_SYRINGE | Freq: Once | INTRAMUSCULAR | Status: AC
  Administered 2023-11-13: 300 ug via SUBCUTANEOUS
  Filled 2023-11-13: qty 0.6

## 2023-11-13 NOTE — Progress Notes (Signed)
 Hematology and Oncology Follow Up Visit  Kelly Soto [id number] May 08, 1956 68 y.o. 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 is back for follow-up.  She is getting an insulin  pump soon. She is nervous but excited about this.   She has also been started on Levothyroxine for hypothyroidism which was discovered last month. She is hopeful that her fatigue has improved.   She does need Aranesp  today. She last saw her kidney specialist about 3 weeks ago.   When we last saw her, her ferritin was 546 with an iron saturation of 38%.  She has had no change in bowel or bladder habits.  She has had no cough.  There has been no issues with COVID.  She has had no leg swelling.  She has had no neuropathy.  She is still enjoying her dog.  He is 76 years old.  Overall, I would say that her performance status is ECOG 1.    Medications:  Current Outpatient Medications:    amLODipine (NORVASC) 10 MG tablet, Take 10 mg by mouth daily., Disp: , Rfl:    Ascorbic Acid (VITAMIN C) 500 MG CAPS, Take 1 capsule by mouth daily., Disp: , Rfl:    baclofen (LIORESAL) 10 MG tablet, 5 mg daily as needed. 08/14/2022 For migraines., Disp: , Rfl:    betamethasone  valerate (VALISONE ) 0.1 % cream, Apply 1 application  topically daily as needed (irritation)., Disp: , Rfl:    carvedilol (COREG) 25 MG tablet, Take 25 mg by mouth 2 (two) times daily with a meal., Disp: , Rfl:    chlorthalidone (HYGROTON) 25 MG tablet, Take 25 mg by mouth daily., Disp: , Rfl:    Continuous Blood Gluc Sensor (FREESTYLE LIBRE 2 SENSOR) MISC, USE AS DIRECTED TO CHECK BLOOD SUGAR FOR 14 DAYS, Disp: , Rfl:    diazepam  (VALIUM ) 5 MG tablet, Take 5 mg by mouth 2  (two) times daily as needed for anxiety., Disp: , Rfl: 0   fluocinonide (LIDEX) 0.05 % external solution, Apply 1 application. topically 2 (two) times daily., Disp: , Rfl:    fluticasone (FLONASE) 50 MCG/ACT nasal spray, Place 2 sprays into both nostrils at bedtime., Disp: , Rfl:    Galcanezumab-gnlm (EMGALITY) 120 MG/ML SOAJ, Inject 120 mg into the skin every 30 (thirty) days., Disp: , Rfl:    hydroxypropyl methylcellulose / hypromellose (ISOPTO TEARS / GONIOVISC) 2.5 % ophthalmic solution, Place 1 drop into both eyes 3 (three) times daily as needed for dry eyes., Disp: , Rfl:    Insulin  Pen Needle (NOVOFINE PEN NEEDLE) 32G X 6 MM MISC, See admin instructions., Disp: , Rfl:    JARDIANCE 25 MG TABS tablet, Take 25 mg by mouth daily., Disp: , Rfl:    lansoprazole (PREVACID) 30 MG capsule, Take 30 mg by mouth 2 (two) times daily., Disp: , Rfl:    [START ON 11/14/2023] levothyroxine (SYNTHROID) 75 MCG tablet, Take 75 mcg by mouth daily before breakfast., Disp: , Rfl:    metoCLOPramide  (REGLAN ) 10 MG tablet, Take 1 tablet (10 mg total) by mouth every 12 (twelve) hours. Take 10 mg about half hour before lunch and then take 10 mg at bedtime., Disp: 60 tablet, Rfl: 3  metroNIDAZOLE (METROGEL) 0.75 % gel, Apply 1 application. topically 2 (two) times daily., Disp: , Rfl:    Multiple Vitamins-Minerals (CENTRUM SILVER 50+WOMEN) TABS, as directed Orally, Disp: , Rfl:    NOVOLOG  FLEXPEN 100 UNIT/ML FlexPen, Inject into the skin., Disp: , Rfl:    Romosozumab-aqqg (EVENITY [place]), Inject into the skin every 30 (thirty) days., Disp: , Rfl:    saccharomyces boulardii (FLORASTOR) 250 MG capsule, Take 250 mg by mouth in the morning, at noon, and at bedtime., Disp: , Rfl:    Simethicone (GAS FREE EXTRA STRENGTH PO), Take 2 tablets by mouth at bedtime., Disp: , Rfl:    sodium chloride  (OCEAN) 0.65 % nasal spray, 2 sprays, Disp: , Rfl:    tobramycin (TOBREX) 0.3 % ophthalmic solution, Place 1 drop into both eyes See admin  instructions. Instill one drop into the affected eye four times a day prior and the day after eye injection., Disp: , Rfl:    TRESIBA FLEXTOUCH 100 UNIT/ML SOPN FlexTouch Pen, Inject 13 Units into the skin daily., Disp: , Rfl:    triamcinolone (KENALOG) 0.025 % cream, Apply 1 application. topically 2 (two) times daily., Disp: , Rfl:   Allergies:  Allergies  Allergen Reactions   Ace Inhibitors Other (See Comments) and Anaphylaxis    Angioedema   Aspirin Other (See Comments)    Cannot take due to gastric bypass surgery   Farxiga [Dapagliflozin] Nausea And Vomiting and Other (See Comments)    Dizziness, tingling in upper extremities.   Nsaids Anaphylaxis and Other (See Comments)    Cannot take orally due to s/p gastric bypass   Sular [Nisoldipine Er] Other (See Comments)    angioedema   Nisoldipine Swelling and Other (See Comments)   Other Other (See Comments)   Boniva [Ibandronate] Other (See Comments)    Joint pain    Past Medical History, Surgical history, Social history, and Family History were reviewed and updated.  Review of Systems: Review of Systems  Constitutional: Negative.   HENT: Negative.    Eyes: Negative.   Respiratory: Negative.    Cardiovascular: Negative.   Gastrointestinal: Negative.   Genitourinary: Negative.   Musculoskeletal: Negative.   Skin: Negative.   Neurological: Negative.   Endo/Heme/Allergies: Negative.   Psychiatric/Behavioral: Negative.      Physical Exam:  height is 5\' 1"  (1.549 m) and weight is 161 lb (73 kg). Her oral temperature is 97.9 F (36.6 C). Her blood pressure is 154/61 (abnormal) and her pulse is 65. Her respiration is 18 and oxygen saturation is 100%.   Physical Exam Vitals reviewed.  HENT:     Head: Normocephalic and atraumatic.  Eyes:     Pupils: Pupils are equal, round, and reactive to light.  Cardiovascular:     Rate and Rhythm: Normal rate and regular rhythm.     Heart sounds: Normal heart sounds.  Pulmonary:      Effort: Pulmonary effort is normal.     Breath sounds: Normal breath sounds.  Abdominal:     General: Bowel sounds are normal.     Palpations: Abdomen is soft.  Musculoskeletal:        General: No tenderness or deformity. Normal range of motion.     Cervical back: Normal range of motion.  Lymphadenopathy:     Cervical: No cervical adenopathy.  Skin:    General: Skin is warm and dry.     Findings: No erythema or rash.  Neurological:     Mental Status: She is alert and  oriented to person, place, and time.  Psychiatric:        Behavior: Behavior normal.        Thought Content: Thought content normal.        Judgment: Judgment normal.      Lab Results  Component Value Date   WBC 8.3 11/13/2023   HGB 9.6 (L) 11/13/2023   HCT 30.7 (L) 11/13/2023   MCV 95.0 11/13/2023   PLT 231 11/13/2023     Chemistry      Component Value Date/Time   NA 138 11/13/2023 1040   NA 145 05/10/2017 0748   NA 137 12/06/2016 0746   K 4.8 11/13/2023 1040   K 4.7 05/10/2017 0748   K 5.2 (H) 12/06/2016 0746   CL 105 11/13/2023 1040   CL 110 (H) 05/10/2017 0748   CO2 25 11/13/2023 1040   CO2 23 05/10/2017 0748   CO2 23 12/06/2016 0746   BUN 64 (H) 11/13/2023 1040   BUN 28 (H) 05/10/2017 0748   BUN 26.1 (H) 12/06/2016 0746   CREATININE 1.98 (H) 11/13/2023 1040   CREATININE 1.2 05/10/2017 0748   CREATININE 1.2 (H) 12/06/2016 0746      Component Value Date/Time   CALCIUM 10.1 11/13/2023 1040   CALCIUM 9.1 05/10/2017 0748   CALCIUM 9.1 12/06/2016 0746   ALKPHOS 116 11/13/2023 1040   ALKPHOS 61 05/10/2017 0748   ALKPHOS 41 12/06/2016 0746   AST 21 11/13/2023 1040   AST 23 12/06/2016 0746   ALT 23 11/13/2023 1040   ALT 31 05/10/2017 0748   ALT 26 12/06/2016 0746   BILITOT 0.2 11/13/2023 1040   BILITOT 0.26 12/06/2016 0746     Encounter Diagnoses  Name Primary?   Anemia of renal disease Yes   Iron deficiency anemia due to chronic blood loss    H/O gastric bypass    Chronic renal  impairment, stage 3a (HCC)    White coat syndrome with diagnosis of hypertension     Impression and Plan: Ms. Lovan is 68 year old white female. She has diabetes. She has iron deficiency anemia. She has a very low erythropoietin  level. She was recently diagnosed with hypothyroidism.   I have advised her to continue her follow up with her specialists She will continue to work on hydration with water   Aranesp  today for Hgb 9.6 RTC 6 weeks MD, labs(CBC w/, CMP, iron, ferritin, retic) , Aranesp     Sharla Davis, PA-C 5/20/202512:06 PM

## 2023-12-06 ENCOUNTER — Other Ambulatory Visit: Payer: Self-pay | Admitting: Hematology & Oncology

## 2023-12-25 ENCOUNTER — Inpatient Hospital Stay

## 2023-12-25 ENCOUNTER — Inpatient Hospital Stay: Attending: Hematology & Oncology

## 2023-12-25 ENCOUNTER — Inpatient Hospital Stay (HOSPITAL_BASED_OUTPATIENT_CLINIC_OR_DEPARTMENT_OTHER): Admitting: Medical Oncology

## 2023-12-25 ENCOUNTER — Encounter: Payer: Self-pay | Admitting: Medical Oncology

## 2023-12-25 VITALS — BP 143/48 | HR 64 | Temp 97.9°F | Resp 18 | Ht 61.0 in | Wt 164.0 lb

## 2023-12-25 DIAGNOSIS — Z9884 Bariatric surgery status: Secondary | ICD-10-CM | POA: Diagnosis not present

## 2023-12-25 DIAGNOSIS — E119 Type 2 diabetes mellitus without complications: Secondary | ICD-10-CM | POA: Insufficient documentation

## 2023-12-25 DIAGNOSIS — N1831 Chronic kidney disease, stage 3a: Secondary | ICD-10-CM

## 2023-12-25 DIAGNOSIS — E039 Hypothyroidism, unspecified: Secondary | ICD-10-CM | POA: Insufficient documentation

## 2023-12-25 DIAGNOSIS — R748 Abnormal levels of other serum enzymes: Secondary | ICD-10-CM | POA: Diagnosis not present

## 2023-12-25 DIAGNOSIS — N189 Chronic kidney disease, unspecified: Secondary | ICD-10-CM | POA: Diagnosis not present

## 2023-12-25 DIAGNOSIS — Z79899 Other long term (current) drug therapy: Secondary | ICD-10-CM | POA: Diagnosis not present

## 2023-12-25 DIAGNOSIS — Z794 Long term (current) use of insulin: Secondary | ICD-10-CM | POA: Insufficient documentation

## 2023-12-25 DIAGNOSIS — D631 Anemia in chronic kidney disease: Secondary | ICD-10-CM

## 2023-12-25 DIAGNOSIS — D509 Iron deficiency anemia, unspecified: Secondary | ICD-10-CM | POA: Diagnosis present

## 2023-12-25 DIAGNOSIS — D500 Iron deficiency anemia secondary to blood loss (chronic): Secondary | ICD-10-CM

## 2023-12-25 LAB — RETIC PANEL
Immature Retic Fract: 3.5 % (ref 2.3–15.9)
RBC.: 4 MIL/uL (ref 3.87–5.11)
Retic Count, Absolute: 19.6 10*3/uL (ref 19.0–186.0)
Retic Ct Pct: 0.5 % (ref 0.4–3.1)
Reticulocyte Hemoglobin: 32.9 pg (ref >27.9)

## 2023-12-25 LAB — CBC WITH DIFFERENTIAL (CANCER CENTER ONLY)
Abs Immature Granulocytes: 0.02 10*3/uL (ref 0.00–0.07)
Basophils Absolute: 0.1 10*3/uL (ref 0.0–0.1)
Basophils Relative: 1 %
Eosinophils Absolute: 0.1 10*3/uL (ref 0.0–0.5)
Eosinophils Relative: 1 %
HCT: 37.9 % (ref 36.0–46.0)
Hemoglobin: 11.9 g/dL — ABNORMAL LOW (ref 12.0–15.0)
Immature Granulocytes: 0 %
Lymphocytes Relative: 15 %
Lymphs Abs: 1.2 10*3/uL (ref 0.7–4.0)
MCH: 29.5 pg (ref 26.0–34.0)
MCHC: 31.4 g/dL (ref 30.0–36.0)
MCV: 94 fL (ref 80.0–100.0)
Monocytes Absolute: 0.6 10*3/uL (ref 0.1–1.0)
Monocytes Relative: 8 %
Neutro Abs: 6.4 10*3/uL (ref 1.7–7.7)
Neutrophils Relative %: 75 %
Platelet Count: 208 10*3/uL (ref 150–400)
RBC: 4.03 MIL/uL (ref 3.87–5.11)
RDW: 14.6 % (ref 11.5–15.5)
WBC Count: 8.4 10*3/uL (ref 4.0–10.5)
nRBC: 0 % (ref 0.0–0.2)

## 2023-12-25 LAB — CMP (CANCER CENTER ONLY)
ALT: 23 U/L (ref 0–44)
AST: 22 U/L (ref 15–41)
Albumin: 4.1 g/dL (ref 3.5–5.0)
Alkaline Phosphatase: 154 U/L — ABNORMAL HIGH (ref 38–126)
Anion gap: 8 (ref 5–15)
BUN: 65 mg/dL — ABNORMAL HIGH (ref 8–23)
CO2: 25 mmol/L (ref 22–32)
Calcium: 9.9 mg/dL (ref 8.9–10.3)
Chloride: 102 mmol/L (ref 98–111)
Creatinine: 1.53 mg/dL — ABNORMAL HIGH (ref 0.44–1.00)
GFR, Estimated: 37 mL/min — ABNORMAL LOW (ref >60)
Glucose, Bld: 152 mg/dL — ABNORMAL HIGH (ref 70–99)
Potassium: 4.8 mmol/L (ref 3.5–5.1)
Sodium: 135 mmol/L (ref 135–145)
Total Bilirubin: 0.3 mg/dL (ref 0.0–1.2)
Total Protein: 7.2 g/dL (ref 6.5–8.1)

## 2023-12-25 LAB — VITAMIN B12: Vitamin B-12: 459 pg/mL (ref 180–914)

## 2023-12-25 LAB — FERRITIN: Ferritin: 837 ng/mL — ABNORMAL HIGH (ref 11–307)

## 2023-12-25 LAB — IRON AND IRON BINDING CAPACITY (CC-WL,HP ONLY)
Iron: 175 ug/dL — ABNORMAL HIGH (ref 28–170)
Saturation Ratios: 53 % — ABNORMAL HIGH (ref 10.4–31.8)
TIBC: 328 ug/dL (ref 250–450)
UIBC: 153 ug/dL (ref 148–442)

## 2023-12-25 LAB — FOLATE: Folate: 35.1 ng/mL (ref >5.9)

## 2023-12-25 NOTE — Progress Notes (Signed)
 Hematology and Oncology Follow Up Visit  Kelly Soto [id number] 1955-08-07 68 y.o. 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  Kelly Soto is back for follow-up.  She reports that she has been doing well overall. She goes for her insulin  pump tomorrow. She is nervous and excited for this.   She has also been started on Levothyroxine for hypothyroidism which was recently discovered. She is feeling ok but has gained a bit of weight.   When we last saw her, her ferritin was 916 with an iron saturation of 24%.  She has had no change in bowel or bladder habits.  She has had no cough.  There has been no issues with COVID.No bleeding.   She has had no leg swelling.  She has had no neuropathy.  Overall, I would say that her performance status is ECOG 1.   Wt Readings from Last 3 Encounters:  12/25/23 164 lb (74.4 kg)  11/13/23 161 lb (73 kg)  10/01/23 157 lb (71.2 kg)     Medications:  Current Outpatient Medications:    amLODipine (NORVASC) 10 MG tablet, Take 10 mg by mouth daily., Disp: , Rfl:    Ascorbic Acid (VITAMIN C) 500 MG CAPS, Take 1 capsule by mouth daily., Disp: , Rfl:    baclofen (LIORESAL) 10 MG tablet, 5 mg daily as needed. 08/14/2022 For migraines., Disp: , Rfl:    BAQSIMI ONE PACK 3 MG/DOSE POWD, Place 1 spray into both nostrils as needed., Disp: , Rfl:    betamethasone  valerate (VALISONE ) 0.1 % cream, Apply 1 application  topically daily as needed (irritation)., Disp: , Rfl:    carvedilol (COREG) 25 MG tablet, Take 25 mg by mouth 2 (two) times daily with a meal., Disp: , Rfl:    chlorthalidone (HYGROTON) 25 MG tablet, Take 25 mg by mouth daily., Disp: , Rfl:    diazepam  (VALIUM ) 5 MG tablet, Take 5  mg by mouth 2 (two) times daily as needed for anxiety., Disp: , Rfl: 0   fluocinonide (LIDEX) 0.05 % external solution, Apply 1 application. topically 2 (two) times daily., Disp: , Rfl:    fluticasone (FLONASE) 50 MCG/ACT nasal spray, Place 2 sprays into both nostrils at bedtime., Disp: , Rfl:    Galcanezumab-gnlm (EMGALITY) 120 MG/ML SOAJ, Inject 120 mg into the skin every 30 (thirty) days., Disp: , Rfl:    hydroxypropyl methylcellulose / hypromellose (ISOPTO TEARS / GONIOVISC) 2.5 % ophthalmic solution, Place 1 drop into both eyes 3 (three) times daily as needed for dry eyes., Disp: , Rfl:    Insulin  Pen Needle (NOVOFINE PEN NEEDLE) 32G X 6 MM MISC, See admin instructions., Disp: , Rfl:    JARDIANCE 25 MG TABS tablet, Take 25 mg by mouth daily., Disp: , Rfl:    lansoprazole (PREVACID) 30 MG capsule, Take 30 mg by mouth 2 (two) times daily., Disp: , Rfl:    levothyroxine (SYNTHROID) 75 MCG tablet, Take 75 mcg by mouth daily before breakfast., Disp: , Rfl:    metoCLOPramide  (REGLAN ) 10 MG tablet, TAKE 1 TABLET BY MOUTH EVERY 12 HOURS TAKE  1  ABOUT  HALF  HOUR  BEFORE  LUNCH  AND  THEN  TAKE  1  AT  BEDTIME, Disp: 60 tablet, Rfl: 0   metroNIDAZOLE (METROGEL) 0.75 % gel, Apply 1 application. topically 2 (two) times daily., Disp: , Rfl:    Multiple Vitamins-Minerals (CENTRUM SILVER 50+WOMEN) TABS, as directed Orally, Disp: , Rfl:    NOVOLOG  FLEXPEN 100 UNIT/ML FlexPen, Inject into the skin., Disp: , Rfl:    Romosozumab-aqqg (EVENITY [place]), Inject into the skin every 30 (thirty) days., Disp: , Rfl:    saccharomyces boulardii (FLORASTOR) 250 MG capsule, Take 250 mg by mouth in the morning, at noon, and at bedtime., Disp: , Rfl:    Simethicone (GAS FREE EXTRA STRENGTH PO), Take 2 tablets by mouth at bedtime., Disp: , Rfl:    sodium chloride  (OCEAN) 0.65 % nasal spray, 2 sprays, Disp: , Rfl:    tobramycin (TOBREX) 0.3 % ophthalmic solution, Place 1 drop into both eyes See admin instructions. Instill one  drop into the affected eye four times a day prior and the day after eye injection., Disp: , Rfl:    triamcinolone (KENALOG) 0.025 % cream, Apply 1 application. topically 2 (two) times daily., Disp: , Rfl:    Continuous Blood Gluc Sensor (FREESTYLE LIBRE 2 SENSOR) MISC, USE AS DIRECTED TO CHECK BLOOD SUGAR FOR 14 DAYS (Patient not taking: Reported on 12/25/2023), Disp: , Rfl:    Continuous Glucose Receiver (FREESTYLE LIBRE 2 READER) DEVI, as directed Read blood sugar; Duration: 14 days (Patient not taking: Reported on 12/25/2023), Disp: , Rfl:    NOVOLOG  RELION 100 UNIT/ML injection, Inject 40 Units into the skin daily. (Patient not taking: Reported on 12/25/2023), Disp: , Rfl:    TRESIBA FLEXTOUCH 100 UNIT/ML SOPN FlexTouch Pen, Inject 13 Units into the skin daily. (Patient not taking: Reported on 12/25/2023), Disp: , Rfl:   Allergies:  Allergies  Allergen Reactions   Ace Inhibitors Other (See Comments) and Anaphylaxis    Angioedema   Aspirin Other (See Comments)    Cannot take due to gastric bypass surgery   Farxiga [Dapagliflozin] Nausea And Vomiting and Other (See Comments)    Dizziness, tingling in upper extremities.   Nsaids Anaphylaxis and Other (See Comments)    Cannot take orally due to s/p gastric bypass   Sular [Nisoldipine Er] Other (See Comments)    angioedema   Nisoldipine Swelling and Other (See Comments)   Other Other (See Comments)   Boniva [Ibandronate] Other (See Comments)    Joint pain    Past Medical History, Surgical history, Social history, and Family History were reviewed and updated.  Review of Systems: Review of Systems  Constitutional: Negative.   HENT: Negative.    Eyes: Negative.   Respiratory: Negative.    Cardiovascular: Negative.   Gastrointestinal: Negative.   Genitourinary: Negative.   Musculoskeletal: Negative.   Skin: Negative.   Neurological: Negative.   Endo/Heme/Allergies: Negative.   Psychiatric/Behavioral: Negative.      Physical Exam:   height is 5' 1 (1.549 m) and weight is 164 lb (74.4 kg). Her oral temperature is 97.9 F (36.6 C). Her blood pressure is 143/48 (abnormal) and her pulse is 64. Her respiration is 18 and oxygen saturation is 100%.   Physical Exam Vitals reviewed.  HENT:     Head: Normocephalic and atraumatic.   Eyes:     Pupils: Pupils are equal, round, and reactive to light.    Cardiovascular:     Rate and Rhythm: Normal rate and regular rhythm.     Heart sounds: Normal heart sounds.  Pulmonary:     Effort: Pulmonary effort  is normal.     Breath sounds: Normal breath sounds.  Abdominal:     General: Bowel sounds are normal.     Palpations: Abdomen is soft.   Musculoskeletal:        General: No tenderness or deformity. Normal range of motion.     Cervical back: Normal range of motion.  Lymphadenopathy:     Cervical: No cervical adenopathy.   Skin:    General: Skin is warm and dry.     Findings: No erythema or rash.   Neurological:     Mental Status: She is alert and oriented to person, place, and time.   Psychiatric:        Behavior: Behavior normal.        Thought Content: Thought content normal.        Judgment: Judgment normal.      Lab Results  Component Value Date   WBC 8.4 12/25/2023   HGB 11.9 (L) 12/25/2023   HCT 37.9 12/25/2023   MCV 94.0 12/25/2023   PLT 208 12/25/2023     Chemistry      Component Value Date/Time   NA 135 12/25/2023 1013   NA 145 05/10/2017 0748   NA 137 12/06/2016 0746   K 4.8 12/25/2023 1013   K 4.7 05/10/2017 0748   K 5.2 (H) 12/06/2016 0746   CL 102 12/25/2023 1013   CL 110 (H) 05/10/2017 0748   CO2 25 12/25/2023 1013   CO2 23 05/10/2017 0748   CO2 23 12/06/2016 0746   BUN 65 (H) 12/25/2023 1013   BUN 28 (H) 05/10/2017 0748   BUN 26.1 (H) 12/06/2016 0746   CREATININE 1.53 (H) 12/25/2023 1013   CREATININE 1.2 05/10/2017 0748   CREATININE 1.2 (H) 12/06/2016 0746      Component Value Date/Time   CALCIUM 9.9 12/25/2023 1013   CALCIUM  9.1 05/10/2017 0748   CALCIUM 9.1 12/06/2016 0746   ALKPHOS 154 (H) 12/25/2023 1013   ALKPHOS 61 05/10/2017 0748   ALKPHOS 41 12/06/2016 0746   AST 22 12/25/2023 1013   AST 23 12/06/2016 0746   ALT 23 12/25/2023 1013   ALT 31 05/10/2017 0748   ALT 26 12/06/2016 0746   BILITOT 0.3 12/25/2023 1013   BILITOT 0.26 12/06/2016 0746     No diagnosis found.   Impression and Plan: Ms. Ibanez is 68 year old white female. She has diabetes. She has iron deficiency anemia. She has a very low erythropoietin  level. She was recently diagnosed with hypothyroidism.   She will continue working with her specialists but I am also keeping an eye on her ferritin and alk phos levels which have risen. Iron saturation is normal so this ferritin elevation is likely secondary to inflammation, etc. No unintentional weight loss or night sweats. No abdominal pain.  CBC shows a Hgb of 11.9 Hydration with water encouraged for her elevated BUN.  B12, folate pending.   No Aranesp  today RTC 6 weeks MD, labs(CBC w/, CMP, iron, ferritin, retic) , Aranesp     Lauraine CHRISTELLA Dais, PA-C 7/1/202510:58 AM

## 2023-12-27 ENCOUNTER — Ambulatory Visit: Payer: Self-pay | Admitting: Medical Oncology

## 2024-01-06 ENCOUNTER — Other Ambulatory Visit: Payer: Self-pay | Admitting: Hematology & Oncology

## 2024-01-07 ENCOUNTER — Encounter: Payer: Self-pay | Admitting: Hematology & Oncology

## 2024-02-05 ENCOUNTER — Inpatient Hospital Stay: Attending: Hematology & Oncology

## 2024-02-05 ENCOUNTER — Other Ambulatory Visit: Payer: Self-pay | Admitting: Medical Oncology

## 2024-02-05 ENCOUNTER — Inpatient Hospital Stay

## 2024-02-05 ENCOUNTER — Inpatient Hospital Stay (HOSPITAL_BASED_OUTPATIENT_CLINIC_OR_DEPARTMENT_OTHER): Admitting: Medical Oncology

## 2024-02-05 VITALS — BP 152/56 | HR 58 | Temp 97.8°F | Resp 19 | Ht 61.0 in | Wt 164.0 lb

## 2024-02-05 DIAGNOSIS — R748 Abnormal levels of other serum enzymes: Secondary | ICD-10-CM

## 2024-02-05 DIAGNOSIS — D500 Iron deficiency anemia secondary to blood loss (chronic): Secondary | ICD-10-CM

## 2024-02-05 DIAGNOSIS — N1831 Chronic kidney disease, stage 3a: Secondary | ICD-10-CM | POA: Diagnosis not present

## 2024-02-05 DIAGNOSIS — D631 Anemia in chronic kidney disease: Secondary | ICD-10-CM | POA: Diagnosis present

## 2024-02-05 DIAGNOSIS — Z9884 Bariatric surgery status: Secondary | ICD-10-CM

## 2024-02-05 DIAGNOSIS — N189 Chronic kidney disease, unspecified: Secondary | ICD-10-CM

## 2024-02-05 LAB — CBC WITH DIFFERENTIAL (CANCER CENTER ONLY)
Abs Immature Granulocytes: 0.02 K/uL (ref 0.00–0.07)
Basophils Absolute: 0 K/uL (ref 0.0–0.1)
Basophils Relative: 0 %
Eosinophils Absolute: 0.1 K/uL (ref 0.0–0.5)
Eosinophils Relative: 2 %
HCT: 34.8 % — ABNORMAL LOW (ref 36.0–46.0)
Hemoglobin: 10.8 g/dL — ABNORMAL LOW (ref 12.0–15.0)
Immature Granulocytes: 0 %
Lymphocytes Relative: 19 %
Lymphs Abs: 1.5 K/uL (ref 0.7–4.0)
MCH: 29.2 pg (ref 26.0–34.0)
MCHC: 31 g/dL (ref 30.0–36.0)
MCV: 94.1 fL (ref 80.0–100.0)
Monocytes Absolute: 0.7 K/uL (ref 0.1–1.0)
Monocytes Relative: 9 %
Neutro Abs: 5.5 K/uL (ref 1.7–7.7)
Neutrophils Relative %: 70 %
Platelet Count: 236 K/uL (ref 150–400)
RBC: 3.7 MIL/uL — ABNORMAL LOW (ref 3.87–5.11)
RDW: 14 % (ref 11.5–15.5)
WBC Count: 7.8 K/uL (ref 4.0–10.5)
nRBC: 0 % (ref 0.0–0.2)

## 2024-02-05 LAB — IRON AND IRON BINDING CAPACITY (CC-WL,HP ONLY)
Iron: 69 ug/dL (ref 28–170)
Saturation Ratios: 19 % (ref 10.4–31.8)
TIBC: 360 ug/dL (ref 250–450)
UIBC: 291 ug/dL

## 2024-02-05 LAB — CMP (CANCER CENTER ONLY)
ALT: 22 U/L (ref 0–44)
AST: 26 U/L (ref 15–41)
Albumin: 4.1 g/dL (ref 3.5–5.0)
Alkaline Phosphatase: 190 U/L — ABNORMAL HIGH (ref 38–126)
Anion gap: 11 (ref 5–15)
BUN: 48 mg/dL — ABNORMAL HIGH (ref 8–23)
CO2: 24 mmol/L (ref 22–32)
Calcium: 9.8 mg/dL (ref 8.9–10.3)
Chloride: 103 mmol/L (ref 98–111)
Creatinine: 1.39 mg/dL — ABNORMAL HIGH (ref 0.44–1.00)
GFR, Estimated: 41 mL/min — ABNORMAL LOW (ref >60)
Glucose, Bld: 157 mg/dL — ABNORMAL HIGH (ref 70–99)
Potassium: 5 mmol/L (ref 3.5–5.1)
Sodium: 137 mmol/L (ref 135–145)
Total Bilirubin: 0.2 mg/dL (ref 0.0–1.2)
Total Protein: 7.2 g/dL (ref 6.5–8.1)

## 2024-02-05 LAB — RETIC PANEL
Immature Retic Fract: 5.2 % (ref 2.3–15.9)
RBC.: 3.7 MIL/uL — ABNORMAL LOW (ref 3.87–5.11)
Retic Count, Absolute: 45.1 K/uL (ref 19.0–186.0)
Retic Ct Pct: 1.2 % (ref 0.4–3.1)
Reticulocyte Hemoglobin: 32.4 pg (ref >27.9)

## 2024-02-05 LAB — FERRITIN: Ferritin: 885 ng/mL — ABNORMAL HIGH (ref 11–307)

## 2024-02-05 MED ORDER — DARBEPOETIN ALFA 300 MCG/0.6ML IJ SOSY
300.0000 ug | PREFILLED_SYRINGE | Freq: Once | INTRAMUSCULAR | Status: AC
  Administered 2024-02-05 (×2): 300 ug via SUBCUTANEOUS
  Filled 2024-02-05: qty 0.6

## 2024-02-05 NOTE — Patient Instructions (Signed)

## 2024-02-05 NOTE — Progress Notes (Signed)
 Hematology and Oncology Follow Up Visit  Kelly Soto [id number] 04-13-1956 68 y.o. 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 is back for follow-up.  Her insulin  pump is working well. She has gotten adjusted to it and is happy. Average fasting glucose is now around 120.   She is on Levothyroxine for hypothyroidism which was recently discovered. She is feeling ok but has gained a bit of weight. He has follow up with Endo in about 1 month.   She has had no change in bowel or bladder habits.  She has had no cough.  There has been no issues with COVID.No bleeding.   She has had no leg swelling.  She has had no neuropathy.  She is due for an eye exam and is getting it scheduled soon.   Overall, I would say that her performance status is ECOG 1.   Wt Readings from Last 3 Encounters:  02/05/24 164 lb (74.4 kg)  12/25/23 164 lb (74.4 kg)  11/13/23 161 lb (73 kg)     Medications:  Current Outpatient Medications:    amLODipine (NORVASC) 10 MG tablet, Take 10 mg by mouth daily., Disp: , Rfl:    Ascorbic Acid (VITAMIN C) 500 MG CAPS, Take 1 capsule by mouth daily., Disp: , Rfl:    baclofen (LIORESAL) 10 MG tablet, 5 mg daily as needed. 08/14/2022 For migraines., Disp: , Rfl:    BAQSIMI ONE PACK 3 MG/DOSE POWD, Place 1 spray into both nostrils as needed., Disp: , Rfl:    betamethasone  valerate (VALISONE ) 0.1 % cream, Apply 1 application  topically daily as needed (irritation)., Disp: , Rfl:    carvedilol (COREG) 25 MG tablet, Take 25 mg by mouth 2 (two) times daily with a meal., Disp: , Rfl:    chlorthalidone (HYGROTON) 25 MG tablet, Take 25 mg by mouth daily., Disp: , Rfl:    diazepam  (VALIUM ) 5 MG  tablet, Take 5 mg by mouth 2 (two) times daily as needed for anxiety., Disp: , Rfl: 0   fluocinonide (LIDEX) 0.05 % external solution, Apply 1 application. topically 2 (two) times daily., Disp: , Rfl:    fluticasone (FLONASE) 50 MCG/ACT nasal spray, Place 2 sprays into both nostrils at bedtime., Disp: , Rfl:    Galcanezumab-gnlm (EMGALITY) 120 MG/ML SOAJ, Inject 120 mg into the skin every 30 (thirty) days., Disp: , Rfl:    hydroxypropyl methylcellulose / hypromellose (ISOPTO TEARS / GONIOVISC) 2.5 % ophthalmic solution, Place 1 drop into both eyes 3 (three) times daily as needed for dry eyes., Disp: , Rfl:    Insulin  Pen Needle (BD PEN NEEDLE NANO 2ND GEN) 32G X 4 MM MISC, USE 1 ONCE DAILY WITH TRESIBA; Duration: 90, Disp: , Rfl:    Insulin  Pen Needle (NOVOFINE PEN NEEDLE) 32G X 6 MM MISC, See admin instructions., Disp: , Rfl:    JARDIANCE 25 MG TABS tablet, Take 25 mg by mouth daily., Disp: , Rfl:    lansoprazole (PREVACID) 30 MG capsule, Take 30 mg by mouth 2 (two) times daily., Disp: , Rfl:    levothyroxine (SYNTHROID) 75 MCG tablet, Take 75 mcg by mouth daily before breakfast., Disp: , Rfl:    metoCLOPramide  (REGLAN ) 10 MG  tablet, TAKE 1 TABLET BY MOUTH EVERY 12 HOURS ,  TAKE  1  TABLET  ABOUT  HALF  HOUR  BEFORE  LUNCH  AND  THEN  1  TABLET  AT  BEDTIME., Disp: 60 tablet, Rfl: 0   metroNIDAZOLE (METROGEL) 0.75 % gel, Apply 1 application. topically 2 (two) times daily., Disp: , Rfl:    Multiple Vitamins-Minerals (CENTRUM SILVER 50+WOMEN) TABS, as directed Orally, Disp: , Rfl:    NOVOLOG  FLEXPEN 100 UNIT/ML FlexPen, Inject into the skin., Disp: , Rfl:    NOVOLOG  RELION 100 UNIT/ML injection, Inject 40 Units into the skin daily., Disp: , Rfl:    Romosozumab-aqqg (EVENITY [place]), Inject into the skin every 30 (thirty) days., Disp: , Rfl:    Romosozumab-aqqg (EVENITY) 105 MG/1. SOSY injection, Inject 105 mg into the skin once., Disp: , Rfl:    saccharomyces boulardii (FLORASTOR) 250 MG capsule,  Take 250 mg by mouth in the morning, at noon, and at bedtime., Disp: , Rfl:    Simethicone (GAS FREE EXTRA STRENGTH PO), Take 2 tablets by mouth at bedtime., Disp: , Rfl:    sodium chloride  (OCEAN) 0.65 % nasal spray, 2 sprays, Disp: , Rfl:    tobramycin (TOBREX) 0.3 % ophthalmic solution, Place 1 drop into both eyes See admin instructions. Instill one drop into the affected eye four times a day prior and the day after eye injection., Disp: , Rfl:    TRESIBA FLEXTOUCH 100 UNIT/ML SOPN FlexTouch Pen, Inject 13 Units into the skin daily., Disp: , Rfl:    triamcinolone (KENALOG) 0.025 % cream, Apply 1 application. topically 2 (two) times daily., Disp: , Rfl:    Continuous Blood Gluc Sensor (FREESTYLE LIBRE 2 SENSOR) MISC, USE AS DIRECTED TO CHECK BLOOD SUGAR FOR 14 DAYS (Patient not taking: Reported on 12/25/2023), Disp: , Rfl:    Continuous Glucose Receiver (FREESTYLE LIBRE 2 READER) DEVI, as directed Read blood sugar; Duration: 14 days (Patient not taking: Reported on 12/25/2023), Disp: , Rfl:    pioglitazone  (ACTOS ) 45 MG tablet, Take 45 mg by mouth daily., Disp: , Rfl:   Allergies:  Allergies  Allergen Reactions   Ace Inhibitors Other (See Comments) and Anaphylaxis    Angioedema   Aspirin Other (See Comments)    Cannot take due to gastric bypass surgery   Farxiga [Dapagliflozin] Nausea And Vomiting and Other (See Comments)    Dizziness, tingling in upper extremities.   Nsaids Anaphylaxis and Other (See Comments)    Cannot take orally due to s/p gastric bypass   Sular [Nisoldipine Er] Other (See Comments)    angioedema   Nisoldipine Swelling and Other (See Comments)   Other Other (See Comments)   Boniva [Ibandronate] Other (See Comments)    Joint pain    Past Medical History, Surgical history, Social history, and Family History were reviewed and updated.  Review of Systems: Review of Systems  Constitutional: Negative.   HENT: Negative.    Eyes: Negative.   Respiratory: Negative.     Cardiovascular: Negative.   Gastrointestinal: Negative.   Genitourinary: Negative.   Musculoskeletal: Negative.   Skin: Negative.   Neurological: Negative.   Endo/Heme/Allergies: Negative.   Psychiatric/Behavioral: Negative.      Physical Exam:  height is 5' 1 (1.549 m) and weight is 164 lb (74.4 kg). Her oral temperature is 97.8 F (36.6 C). Her blood pressure is 152/56 (abnormal) and her pulse is 58 (abnormal). Her respiration is 19 and oxygen saturation is 100%.   Physical Exam  Vitals reviewed.  HENT:     Head: Normocephalic and atraumatic.  Eyes:     Pupils: Pupils are equal, round, and reactive to light.  Cardiovascular:     Rate and Rhythm: Normal rate and regular rhythm.     Heart sounds: Normal heart sounds.  Pulmonary:     Effort: Pulmonary effort is normal.     Breath sounds: Normal breath sounds.  Abdominal:     General: Bowel sounds are normal.     Palpations: Abdomen is soft.  Musculoskeletal:        General: No tenderness or deformity. Normal range of motion.     Cervical back: Normal range of motion.  Lymphadenopathy:     Cervical: No cervical adenopathy.  Skin:    General: Skin is warm and dry.     Findings: No erythema or rash.  Neurological:     Mental Status: She is alert and oriented to person, place, and time.  Psychiatric:        Behavior: Behavior normal.        Thought Content: Thought content normal.        Judgment: Judgment normal.      Lab Results  Component Value Date   WBC 7.8 02/05/2024   HGB 10.8 (L) 02/05/2024   HCT 34.8 (L) 02/05/2024   MCV 94.1 02/05/2024   PLT 236 02/05/2024     Chemistry      Component Value Date/Time   NA 135 12/25/2023 1013   NA 145 05/10/2017 0748   NA 137 12/06/2016 0746   K 4.8 12/25/2023 1013   K 4.7 05/10/2017 0748   K 5.2 (H) 12/06/2016 0746   CL 102 12/25/2023 1013   CL 110 (H) 05/10/2017 0748   CO2 25 12/25/2023 1013   CO2 23 05/10/2017 0748   CO2 23 12/06/2016 0746   BUN 65 (H)  12/25/2023 1013   BUN 28 (H) 05/10/2017 0748   BUN 26.1 (H) 12/06/2016 0746   CREATININE 1.53 (H) 12/25/2023 1013   CREATININE 1.2 05/10/2017 0748   CREATININE 1.2 (H) 12/06/2016 0746      Component Value Date/Time   CALCIUM 9.9 12/25/2023 1013   CALCIUM 9.1 05/10/2017 0748   CALCIUM 9.1 12/06/2016 0746   ALKPHOS 154 (H) 12/25/2023 1013   ALKPHOS 61 05/10/2017 0748   ALKPHOS 41 12/06/2016 0746   AST 22 12/25/2023 1013   AST 23 12/06/2016 0746   ALT 23 12/25/2023 1013   ALT 31 05/10/2017 0748   ALT 26 12/06/2016 0746   BILITOT 0.3 12/25/2023 1013   BILITOT 0.26 12/06/2016 0746     Encounter Diagnoses  Name Primary?   Chronic renal impairment, stage 3a (HCC) Yes   Iron deficiency anemia due to chronic blood loss    Elevated alkaline phosphatase level      Impression and Plan: Ms. Demeter is 68 year old white female. She has diabetes. She has iron deficiency anemia. She has a very low erythropoietin level. She was recently diagnosed with hypothyroidism.   Alk Phos is elevated again today- will order abdominal US .  CBC shows a Hgb of 10.8- Aranesp today Iron pending B12, folate pending.   Aranesp today RTC 6 weeks MD, labs(CBC w/, CMP, iron, ferritin, retic) , Aranesp    Lauraine CHRISTELLA Dais, PA-C 8/12/202510:24 AM

## 2024-02-07 ENCOUNTER — Ambulatory Visit: Payer: Self-pay | Admitting: Medical Oncology

## 2024-02-22 ENCOUNTER — Ambulatory Visit (HOSPITAL_BASED_OUTPATIENT_CLINIC_OR_DEPARTMENT_OTHER)
Admission: RE | Admit: 2024-02-22 | Discharge: 2024-02-22 | Disposition: A | Discharge status: Home / Self Care | Source: Ambulatory Visit | Attending: Medical Oncology | Admitting: Medical Oncology

## 2024-02-22 DIAGNOSIS — R748 Abnormal levels of other serum enzymes: Secondary | ICD-10-CM | POA: Diagnosis present

## 2024-03-06 ENCOUNTER — Telehealth: Payer: Self-pay | Admitting: Medical Oncology

## 2024-03-06 NOTE — Telephone Encounter (Signed)
 Called and spoke to patient about her Abdominal US  results. Pt able to identify x 3  Hepatic Steatosis- common finding esp in the setting of fatty liver disease. GI follow up recommended along with reduction of any tylenol  and ETOH. Low fat diet recommended. 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   Pt denies questions. Will follow up with PCP.

## 2024-03-18 ENCOUNTER — Other Ambulatory Visit: Payer: Self-pay | Admitting: Medical Oncology

## 2024-03-18 DIAGNOSIS — R748 Abnormal levels of other serum enzymes: Secondary | ICD-10-CM

## 2024-03-18 DIAGNOSIS — N189 Chronic kidney disease, unspecified: Secondary | ICD-10-CM

## 2024-03-18 DIAGNOSIS — D500 Iron deficiency anemia secondary to blood loss (chronic): Secondary | ICD-10-CM

## 2024-03-18 DIAGNOSIS — Z9884 Bariatric surgery status: Secondary | ICD-10-CM

## 2024-03-18 DIAGNOSIS — N1831 Chronic kidney disease, stage 3a: Secondary | ICD-10-CM

## 2024-03-19 ENCOUNTER — Inpatient Hospital Stay

## 2024-03-19 ENCOUNTER — Inpatient Hospital Stay: Attending: Hematology & Oncology

## 2024-03-19 ENCOUNTER — Other Ambulatory Visit: Payer: Self-pay

## 2024-03-19 ENCOUNTER — Inpatient Hospital Stay (HOSPITAL_BASED_OUTPATIENT_CLINIC_OR_DEPARTMENT_OTHER): Admitting: Medical Oncology

## 2024-03-19 ENCOUNTER — Encounter: Payer: Self-pay | Admitting: Medical Oncology

## 2024-03-19 VITALS — BP 137/64 | HR 60 | Temp 97.9°F | Resp 18 | Wt 164.0 lb

## 2024-03-19 DIAGNOSIS — Z79899 Other long term (current) drug therapy: Secondary | ICD-10-CM | POA: Diagnosis not present

## 2024-03-19 DIAGNOSIS — D631 Anemia in chronic kidney disease: Secondary | ICD-10-CM

## 2024-03-19 DIAGNOSIS — D500 Iron deficiency anemia secondary to blood loss (chronic): Secondary | ICD-10-CM

## 2024-03-19 DIAGNOSIS — D509 Iron deficiency anemia, unspecified: Secondary | ICD-10-CM | POA: Insufficient documentation

## 2024-03-19 DIAGNOSIS — E039 Hypothyroidism, unspecified: Secondary | ICD-10-CM | POA: Insufficient documentation

## 2024-03-19 DIAGNOSIS — N189 Chronic kidney disease, unspecified: Secondary | ICD-10-CM | POA: Diagnosis not present

## 2024-03-19 DIAGNOSIS — Z862 Personal history of diseases of the blood and blood-forming organs and certain disorders involving the immune mechanism: Secondary | ICD-10-CM

## 2024-03-19 DIAGNOSIS — R945 Abnormal results of liver function studies: Secondary | ICD-10-CM | POA: Diagnosis not present

## 2024-03-19 DIAGNOSIS — E119 Type 2 diabetes mellitus without complications: Secondary | ICD-10-CM | POA: Diagnosis not present

## 2024-03-19 DIAGNOSIS — Z9884 Bariatric surgery status: Secondary | ICD-10-CM

## 2024-03-19 DIAGNOSIS — D508 Other iron deficiency anemias: Secondary | ICD-10-CM

## 2024-03-19 DIAGNOSIS — R748 Abnormal levels of other serum enzymes: Secondary | ICD-10-CM

## 2024-03-19 DIAGNOSIS — N1831 Chronic kidney disease, stage 3a: Secondary | ICD-10-CM

## 2024-03-19 DIAGNOSIS — R7989 Other specified abnormal findings of blood chemistry: Secondary | ICD-10-CM

## 2024-03-19 LAB — CMP (CANCER CENTER ONLY)
ALT: 68 U/L — ABNORMAL HIGH (ref 0–44)
AST: 56 U/L — ABNORMAL HIGH (ref 15–41)
Albumin: 4.1 g/dL (ref 3.5–5.0)
Alkaline Phosphatase: 174 U/L — ABNORMAL HIGH (ref 38–126)
Anion gap: 12 (ref 5–15)
BUN: 51 mg/dL — ABNORMAL HIGH (ref 8–23)
CO2: 23 mmol/L (ref 22–32)
Calcium: 9.6 mg/dL (ref 8.9–10.3)
Chloride: 100 mmol/L (ref 98–111)
Creatinine: 1.39 mg/dL — ABNORMAL HIGH (ref 0.44–1.00)
GFR, Estimated: 41 mL/min — ABNORMAL LOW (ref >60)
Glucose, Bld: 192 mg/dL — ABNORMAL HIGH (ref 70–99)
Potassium: 4.8 mmol/L (ref 3.5–5.1)
Sodium: 136 mmol/L (ref 135–145)
Total Bilirubin: 0.3 mg/dL (ref 0.0–1.2)
Total Protein: 7.3 g/dL (ref 6.5–8.1)

## 2024-03-19 LAB — RETIC PANEL
Immature Retic Fract: 5.4 % (ref 2.3–15.9)
RBC.: 4.25 MIL/uL (ref 3.87–5.11)
Retic Count, Absolute: 23.8 K/uL (ref 19.0–186.0)
Retic Ct Pct: 0.6 % (ref 0.4–3.1)
Reticulocyte Hemoglobin: 32.5 pg (ref >27.9)

## 2024-03-19 LAB — CBC WITH DIFFERENTIAL/PLATELET
Abs Immature Granulocytes: 0.02 K/uL (ref 0.00–0.07)
Basophils Absolute: 0 K/uL (ref 0.0–0.1)
Basophils Relative: 1 %
Eosinophils Absolute: 0.1 K/uL (ref 0.0–0.5)
Eosinophils Relative: 1 %
HCT: 39.5 % (ref 36.0–46.0)
Hemoglobin: 12.3 g/dL (ref 12.0–15.0)
Immature Granulocytes: 0 %
Lymphocytes Relative: 15 %
Lymphs Abs: 1.3 K/uL (ref 0.7–4.0)
MCH: 28.7 pg (ref 26.0–34.0)
MCHC: 31.1 g/dL (ref 30.0–36.0)
MCV: 92.1 fL (ref 80.0–100.0)
Monocytes Absolute: 0.7 K/uL (ref 0.1–1.0)
Monocytes Relative: 9 %
Neutro Abs: 6.2 K/uL (ref 1.7–7.7)
Neutrophils Relative %: 74 %
Platelets: 210 K/uL (ref 150–400)
RBC: 4.29 MIL/uL (ref 3.87–5.11)
RDW: 15.6 % — ABNORMAL HIGH (ref 11.5–15.5)
WBC: 8.4 K/uL (ref 4.0–10.5)
nRBC: 0 % (ref 0.0–0.2)

## 2024-03-19 LAB — VITAMIN B12: Vitamin B-12: 685 pg/mL (ref 180–914)

## 2024-03-19 LAB — IRON AND IRON BINDING CAPACITY (CC-WL,HP ONLY)
Iron: 106 ug/dL (ref 28–170)
Saturation Ratios: 36 % — ABNORMAL HIGH (ref 10.4–31.8)
TIBC: 294 ug/dL (ref 250–450)
UIBC: 188 ug/dL

## 2024-03-19 LAB — FERRITIN: Ferritin: 696 ng/mL — ABNORMAL HIGH (ref 11–307)

## 2024-03-19 LAB — FOLATE: Folate: >20 ng/mL (ref >5.9)

## 2024-03-19 NOTE — Progress Notes (Signed)
 Hematology and Oncology Follow Up Visit  Kelly Soto [id number] July 06, 1955 68 y.o. 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 is back for follow-up.  Overall doing well. Recent A1C after getting her insulin  pump was 6.6. She is thrilled with this.   She is on Levothyroxine for hypothyroidism which was recently discovered. Her recent TSH was normal.   At her last visit an abdominal US  was ordered following an elevated alk phos level of 190. This US  showed some hepatic steatosis, suspected hepatic steatosis, CKD. She was referred to gastroenterology and will avoid tylenol , ETOH, and try to follow a low fat diet.   She has had no change in bowel or bladder habits.  She has had no cough.  There has been no issues with COVID.No bleeding.   She has had no leg swelling.  She has had no neuropathy.  She due for an eye exam.    Overall, I would say that her performance status is ECOG 1.   Wt Readings from Last 3 Encounters:  03/19/24 164 lb (74.4 kg)  02/05/24 164 lb (74.4 kg)  12/25/23 164 lb (74.4 kg)    Medications:  Current Outpatient Medications:    amLODipine (NORVASC) 10 MG tablet, Take 10 mg by mouth daily., Disp: , Rfl:    Ascorbic Acid (VITAMIN C) 500 MG CAPS, Take 1 capsule by mouth daily., Disp: , Rfl:    baclofen (LIORESAL) 10 MG tablet, 5 mg daily as needed. 08/14/2022 For migraines., Disp: , Rfl:    BAQSIMI ONE PACK 3 MG/DOSE POWD, Place 1 spray into both nostrils as needed., Disp: , Rfl:    betamethasone  valerate (VALISONE ) 0.1 % cream, Apply 1 application  topically daily as needed (irritation)., Disp: , Rfl:    carvedilol (COREG) 25 MG tablet, Take 25 mg by mouth 2 (two) times daily  with a meal., Disp: , Rfl:    chlorthalidone (HYGROTON) 25 MG tablet, Take 25 mg by mouth daily., Disp: , Rfl:    diazepam  (VALIUM ) 5 MG tablet, Take 5 mg by mouth 2 (two) times daily as needed for anxiety., Disp: , Rfl: 0   fluocinonide (LIDEX) 0.05 % external solution, Apply 1 application. topically 2 (two) times daily., Disp: , Rfl:    fluticasone (FLONASE) 50 MCG/ACT nasal spray, Place 2 sprays into both nostrils at bedtime., Disp: , Rfl:    Galcanezumab-gnlm (EMGALITY) 120 MG/ML SOAJ, Inject 120 mg into the skin every 30 (thirty) days., Disp: , Rfl:    hydroxypropyl methylcellulose / hypromellose (ISOPTO TEARS / GONIOVISC) 2.5 % ophthalmic solution, Place 1 drop into both eyes 3 (three) times daily as needed for dry eyes., Disp: , Rfl:    Insulin  Pen Needle (BD PEN NEEDLE NANO 2ND GEN) 32G X 4 MM MISC, USE 1 ONCE DAILY WITH TRESIBA; Duration: 90, Disp: , Rfl:    Insulin  Pen Needle (NOVOFINE PEN NEEDLE) 32G X 6 MM MISC, See admin instructions., Disp: , Rfl:    JARDIANCE 25 MG TABS tablet, Take 25 mg by mouth daily., Disp: , Rfl:    lansoprazole (PREVACID) 30 MG capsule, Take 30 mg by mouth 2 (two) times daily., Disp: , Rfl:    levothyroxine (SYNTHROID) 75 MCG  tablet, Take 75 mcg by mouth daily before breakfast., Disp: , Rfl:    metoCLOPramide  (REGLAN ) 10 MG tablet, TAKE 1 TABLET BY MOUTH EVERY 12 HOURS ,  TAKE  1  TABLET  ABOUT  HALF  HOUR  BEFORE  LUNCH  AND  THEN  1  TABLET  AT  BEDTIME., Disp: 60 tablet, Rfl: 0   metroNIDAZOLE (METROGEL) 0.75 % gel, Apply 1 application. topically 2 (two) times daily., Disp: , Rfl:    Multiple Vitamins-Minerals (CENTRUM SILVER 50+WOMEN) TABS, as directed Orally, Disp: , Rfl:    NOVOLOG  FLEXPEN 100 UNIT/ML FlexPen, Inject into the skin., Disp: , Rfl:    NOVOLOG  RELION 100 UNIT/ML injection, Inject 40 Units into the skin daily., Disp: , Rfl:    Romosozumab-aqqg (EVENITY ), Inject into the skin every 30 (thirty) days., Disp: , Rfl:    Romosozumab-aqqg  (EVENITY) 105 MG/1. SOSY injection, Inject 105 mg into the skin once., Disp: , Rfl:    saccharomyces boulardii (FLORASTOR) 250 MG capsule, Take 250 mg by mouth in the morning, at noon, and at bedtime., Disp: , Rfl:    sertraline (ZOLOFT) 50 MG tablet, Take 50 mg by mouth daily., Disp: , Rfl:    Simethicone (GAS FREE EXTRA STRENGTH PO), Take 2 tablets by mouth at bedtime., Disp: , Rfl:    sodium chloride  (OCEAN) 0.65 % nasal spray, 2 sprays, Disp: , Rfl:    tobramycin (TOBREX) 0.3 % ophthalmic solution, Place 1 drop into both eyes See admin instructions. Instill one drop into the affected eye four times a day prior and the day after eye injection., Disp: , Rfl:    TRESIBA FLEXTOUCH 100 UNIT/ML SOPN FlexTouch Pen, Inject 13 Units into the skin daily., Disp: , Rfl:    triamcinolone (KENALOG) 0.025 % cream, Apply 1 application. topically 2 (two) times daily., Disp: , Rfl:   Allergies:  Allergies  Allergen Reactions   Ace Inhibitors Other (See Comments) and Anaphylaxis    Angioedema   Aspirin Other (See Comments)    Cannot take due to gastric bypass surgery   Farxiga [Dapagliflozin] Nausea And Vomiting and Other (See Comments)    Dizziness, tingling in upper extremities.   Nsaids Anaphylaxis and Other (See Comments)    Cannot take orally due to s/p gastric bypass   Sular [Nisoldipine Er] Other (See Comments)    angioedema   Nisoldipine Swelling and Other (See Comments)   Other Other (See Comments)   Boniva [Ibandronate] Other (See Comments)    Joint pain    Past Medical History, Surgical history, Social history, and Family History were reviewed and updated.  Review of Systems: Review of Systems  Constitutional: Negative.   HENT: Negative.    Eyes: Negative.   Respiratory: Negative.    Cardiovascular: Negative.   Gastrointestinal: Negative.   Genitourinary: Negative.   Musculoskeletal: Negative.   Skin: Negative.   Neurological: Negative.   Endo/Heme/Allergies: Negative.    Psychiatric/Behavioral: Negative.      Physical Exam:  weight is 164 lb (74.4 kg). Her oral temperature is 97.9 F (36.6 C). Her blood pressure is 137/64 and her pulse is 60. Her respiration is 18 and oxygen saturation is 100%.   Physical Exam Vitals reviewed.  HENT:     Head: Normocephalic and atraumatic.  Eyes:     Pupils: Pupils are equal, round, and reactive to light.  Cardiovascular:     Rate and Rhythm: Normal rate and regular rhythm.     Heart sounds: Normal heart  sounds.  Pulmonary:     Effort: Pulmonary effort is normal.     Breath sounds: Normal breath sounds.  Abdominal:     General: Bowel sounds are normal.     Palpations: Abdomen is soft.  Musculoskeletal:        General: No tenderness or deformity. Normal range of motion.     Cervical back: Normal range of motion.  Lymphadenopathy:     Cervical: No cervical adenopathy.  Skin:    General: Skin is warm and dry.     Findings: No erythema or rash.  Neurological:     Mental Status: She is alert and oriented to person, place, and time.  Psychiatric:        Behavior: Behavior normal.        Thought Content: Thought content normal.        Judgment: Judgment normal.      Lab Results  Component Value Date   WBC 7.8 02/05/2024   HGB 10.8 (L) 02/05/2024   HCT 34.8 (L) 02/05/2024   MCV 94.1 02/05/2024   PLT 236 02/05/2024     Chemistry      Component Value Date/Time   NA 137 02/05/2024 0945   NA 145 05/10/2017 0748   NA 137 12/06/2016 0746   K 5.0 02/05/2024 0945   K 4.7 05/10/2017 0748   K 5.2 (H) 12/06/2016 0746   CL 103 02/05/2024 0945   CL 110 (H) 05/10/2017 0748   CO2 24 02/05/2024 0945   CO2 23 05/10/2017 0748   CO2 23 12/06/2016 0746   BUN 48 (H) 02/05/2024 0945   BUN 28 (H) 05/10/2017 0748   BUN 26.1 (H) 12/06/2016 0746   CREATININE 1.39 (H) 02/05/2024 0945   CREATININE 1.2 05/10/2017 0748   CREATININE 1.2 (H) 12/06/2016 0746      Component Value Date/Time   CALCIUM 9.8 02/05/2024 0945    CALCIUM 9.1 05/10/2017 0748   CALCIUM 9.1 12/06/2016 0746   ALKPHOS 190 (H) 02/05/2024 0945   ALKPHOS 61 05/10/2017 0748   ALKPHOS 41 12/06/2016 0746   AST 26 02/05/2024 0945   AST 23 12/06/2016 0746   ALT 22 02/05/2024 0945   ALT 31 05/10/2017 0748   ALT 26 12/06/2016 0746   BILITOT 0.2 02/05/2024 0945   BILITOT 0.26 12/06/2016 0746     Encounter Diagnoses  Name Primary?   Anemia of renal disease Yes   Chronic renal impairment, stage 3a    Iron deficiency anemia due to chronic blood loss    H/O gastric bypass     Impression and Plan: Ms. Vannatter is 68 year old white female. She has diabetes and hypothyroidism. She has iron deficiency anemia. She has a very low erythropoietin  level.   Today LFTS are mildly elevated. Alk Phos is down slightly to 174 from 190. Given her other autoimmune conditions I do question if she may have some autoimmune hepatitis vs other. I have asked that she follow up with PCP within the next 2 weeks for additional work up and GI as soon as possible- referral placed. Again she will avoid ETOH, tylenol .  CBC shows a Hgb of 12.3 Iron pending B12, folate pending.   No Aranesp  needed RTC 6 weeks MD, labs(CBC w/, CMP, iron, ferritin, retic) , Aranesp     Lauraine CHRISTELLA Dais, PA-C 9/24/20259:56 AM

## 2024-03-21 ENCOUNTER — Other Ambulatory Visit: Payer: Self-pay | Admitting: Hematology & Oncology

## 2024-03-21 LAB — ERYTHROPOIETIN: Erythropoietin: 3.7 m[IU]/mL (ref 2.6–18.5)

## 2024-03-25 ENCOUNTER — Ambulatory Visit: Payer: Self-pay | Admitting: Medical Oncology

## 2024-03-28 NOTE — Progress Notes (Signed)
 03/31/2024 Adrien GORMAN Rouse [id number] [birth year]  Referring provider: Loreli Kins, MD Primary GI doctor: Dr. San  ASSESSMENT AND PLAN:  Elevated LFTs Elevated LFTs for 3 months initially isolated alkaline phosphatase most recent labs 03/19/2024 show AST 56 ALT 68 alk phos 174 Status post cholecystectomy 02/22/2024 ABUS fatty liver, echogenic mass superficial right liver consistent benign, spleen prominent with focus repeat abdominal ultrasound 12 months, renal medical disease    Latest Ref Rng & Units 03/19/2024    9:08 AM 02/05/2024    9:45 AM 12/25/2023   10:13 AM  Hepatic Function  Total Protein 6.5 - 8.1 g/dL 7.3  7.2  7.2   Albumin 3.5 - 5.0 g/dL 4.1  4.1  4.1   AST 15 - 41 U/L 56  26  22   ALT 0 - 44 U/L 68  22  23   Alk Phosphatase 38 - 126 U/L 174  190  154   Total Bilirubin 0.0 - 1.2 mg/dL 0.3  0.2  0.3    Platelets 210  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  -Continue to work on risk factor modification including diet exercise and control of risk factors including blood sugars.  IDA with history of gastric bypass and renal disease 03/19/2024  HGB 12.3 MCV 92.1 Platelets 210 03/19/2024 Iron 106 Ferritin 696 B12 685 Recent Labs    05/02/23 0907 06/13/23 0850 08/20/23 0821 10/01/23 0920 11/13/23 1040 12/25/23 1013 02/05/24 0945 03/19/24 0857  HGB 11.9* 10.3* 10.5* 11.3* 9.6* 11.9* 10.8* 12.3  Continue follow up  hematology Will get records for EGD/colon  GERD with history of gastroparesis previous gastric bypass with ulcer Status post cholecystectomy 11/13/2009 EGD Dr. Burnette for hematemesis showed normal esophagus normal pouch normal blind loop benign Schatzki ring and ulcer at gastrojejunostomy of efferent limb 09/12/2023 GES delayed gastric emptying Discussed diet with small, frequent meals, soft diet with the patient.  Can refer to dietician and encouraged weight loss with better control of A1C -Can do short term metoclopramide -We had long discussion about risk of TD, patient expresses understanding and wishes to continue on short course of medication.   Screening colonoscopy 2016 [facility] with Dr. Burnette unremarkable recall 5 years Will get records  Diabetes 2  On insulin  pump  Patient Care Team: Loreli Kins, MD as PCP - General (Family Medicine)  HISTORY OF PRESENT ILLNESS: 68 y.o. female with a past medical history listed below presents for evaluation of elevated LFTs.   Discussed the use of AI scribe software for clinical note transcription with the patient, who gave verbal consent to proceed.  History of Present Illness   Natika Geyer Belkys Henault is a 68 year old female with diabetes and a history of gastric bypass surgery who presents for evaluation of elevated liver function tests and gastroparesis.  She has experienced elevated liver function tests for about three months, starting in July. No history of alcohol or drug use, and she has never smoked. No family history  of liver issues. No yellowing of the eyes or skin, significant itching, nausea, or vomiting since starting her current medications. An ultrasound showed fatty liver and a prominent spleen.  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 She has been on an insulin  pump for about six to seven weeks.  She has a history of  gastric bypass surgery and delayed gastric emptying, confirmed by a gastric emptying study. She experiences major acid reflux and difficulty eating certain foods. She is currently taking metoclopramide , which is effective in managing her symptoms. Occasional abdominal pain and diarrhea occur approximately once a week, but no blood in her stool. She had a colonoscopy and endoscopy in 2011, with no recent procedures. History of an ulcer at the anastomosis site post-gastric bypass, but no bleeding since then.  Her medication history includes metoclopramide  for gastroparesis, diazepam , and a new antidepressant, sertraline. She also receives an osteoporosis injection, referred to as the 'old woman shot'. No new medications in the past three months aside from the osteoporosis injection and sertraline.      She  reports that she has never smoked. She has never used smokeless tobacco. She reports that she does not drink alcohol and does not use drugs.  RELEVANT GI HISTORY, IMAGING AND LABS: Results   RADIOLOGY Liver ultrasound: Fatty liver and splenomegaly  DIAGNOSTIC Gastric emptying study: Delayed gastric emptying      CBC    Component Value Date/Time   WBC 8.4 03/19/2024 0857   RBC 4.25 03/19/2024 0909   RBC 4.29 03/19/2024 0857   HGB 12.3 03/19/2024 0857   HGB 10.8 (L) 02/05/2024 0945   HGB 9.1 repeated (L) 05/10/2017 0748   HGB 10.3 (L) 01/05/2011 1415   HCT 39.5 03/19/2024 0857   HCT 28.9 (L) 05/10/2017 0748   HCT 31.3 (L) 01/05/2011 1415   PLT 210 03/19/2024 0857   PLT 236 02/05/2024 0945   PLT 231 05/10/2017 0748   PLT 231 01/05/2011 1415   MCV 92.1 03/19/2024 0857   MCV 94 05/10/2017 0748   MCV 86.7 01/05/2011 1415   MCH 28.7 03/19/2024 0857   MCHC 31.1 03/19/2024 0857   RDW 15.6 (H) 03/19/2024 0857   RDW 15.0 05/10/2017 0748   RDW 13.3 01/05/2011 1415   LYMPHSABS 1.3 03/19/2024 0857   LYMPHSABS 1.5 05/10/2017 0748   LYMPHSABS 2.7 01/05/2011 1415   MONOABS 0.7 03/19/2024  0857   MONOABS 0.6 01/05/2011 1415   EOSABS 0.1 03/19/2024 0857   EOSABS 0.1 05/10/2017 0748   BASOSABS 0.0 03/19/2024 0857   BASOSABS 0.0 05/10/2017 0748   BASOSABS 0.0 01/05/2011 1415   Recent Labs    05/02/23 0907 06/13/23 0850 08/20/23 0821 10/01/23 0920 11/13/23 1040 12/25/23 1013 02/05/24 0945 03/19/24 0857  HGB 11.9* 10.3* 10.5* 11.3* 9.6* 11.9* 10.8* 12.3    CMP     Component Value Date/Time   NA 136 03/19/2024 0908   NA 145 05/10/2017 0748   NA 137 12/06/2016 0746   K 4.8 03/19/2024 0908   K 4.7 05/10/2017 0748   K 5.2 (H) 12/06/2016 0746   CL 100 03/19/2024 0908   CL 110 (H) 05/10/2017 0748   CO2 23 03/19/2024 0908   CO2 23 05/10/2017 0748   CO2 23 12/06/2016 0746   GLUCOSE 192 (H) 03/19/2024 0908   GLUCOSE 190 (H) 05/10/2017 0748   BUN 51 (H) 03/19/2024 0908   BUN 28 (H) 05/10/2017  0748   BUN 26.1 (H) 12/06/2016 0746   CREATININE 1.39 (H) 03/19/2024 0908   CREATININE 1.2 05/10/2017 0748   CREATININE 1.2 (H) 12/06/2016 0746   CALCIUM 9.6 03/19/2024 0908   CALCIUM 9.1 05/10/2017 0748   CALCIUM 9.1 12/06/2016 0746   PROT 7.3 03/19/2024 0908   PROT 6.5 05/10/2017 0748   PROT 6.3 (L) 12/06/2016 0746   ALBUMIN 4.1 03/19/2024 0908   ALBUMIN 3.4 05/10/2017 0748   ALBUMIN 3.5 12/06/2016 0746   AST 56 (H) 03/19/2024 0908   AST 23 12/06/2016 0746   ALT 68 (H) 03/19/2024 0908   ALT 31 05/10/2017 0748   ALT 26 12/06/2016 0746   ALKPHOS 174 (H) 03/19/2024 0908   ALKPHOS 61 05/10/2017 0748   ALKPHOS 41 12/06/2016 0746   BILITOT 0.3 03/19/2024 0908   BILITOT 0.26 12/06/2016 0746   GFRNONAA 41 (L) 03/19/2024 0908   GFRAA 60 (L) 03/16/2020 0741      Latest Ref Rng & Units 03/19/2024    9:08 AM 02/05/2024    9:45 AM 12/25/2023   10:13 AM  Hepatic Function  Total Protein 6.5 - 8.1 g/dL 7.3  7.2  7.2   Albumin 3.5 - 5.0 g/dL 4.1  4.1  4.1   AST 15 - 41 U/L 56  26  22   ALT 0 - 44 U/L 68  22  23   Alk Phosphatase 38 - 126 U/L 174  190  154   Total  Bilirubin 0.0 - 1.2 mg/dL 0.3  0.2  0.3       Current Medications:   Current Outpatient Medications (Endocrine & Metabolic):    BAQSIMI ONE PACK 3 MG/DOSE POWD, Place 1 spray into both nostrils as needed.   JARDIANCE 25 MG TABS tablet, Take 25 mg by mouth daily.   levothyroxine (SYNTHROID) 75 MCG tablet, Take 75 mcg by mouth daily before breakfast.   NOVOLOG  FLEXPEN 100 UNIT/ML FlexPen, Inject into the skin.   NOVOLOG  RELION 100 UNIT/ML injection, Inject 40 Units into the skin daily.   Romosozumab-aqqg (EVENITY [place]), Inject into the skin every 30 (thirty) days.   Romosozumab-aqqg (EVENITY) 105 MG/1. SOSY injection, Inject 105 mg into the skin once.   TRESIBA FLEXTOUCH 100 UNIT/ML SOPN FlexTouch Pen, Inject 13 Units into the skin daily.  Current Outpatient Medications (Cardiovascular):    amLODipine (NORVASC) 10 MG tablet, Take 10 mg by mouth daily.   carvedilol (COREG) 25 MG tablet, Take 25 mg by mouth 2 (two) times daily with a meal.   chlorthalidone (HYGROTON) 25 MG tablet, Take 25 mg by mouth daily.  Current Outpatient Medications (Respiratory):    fluticasone (FLONASE) 50 MCG/ACT nasal spray, Place 2 sprays into both nostrils at bedtime.   sodium chloride  (OCEAN) 0.65 % nasal spray, 2 sprays  Current Outpatient Medications (Analgesics):    Galcanezumab-gnlm (EMGALITY) 120 MG/ML SOAJ, Inject 120 mg into the skin every 30 (thirty) days.   Current Outpatient Medications (Other):    Ascorbic Acid (VITAMIN C) 500 MG CAPS, Take 1 capsule by mouth daily.   baclofen (LIORESAL) 10 MG tablet, 5 mg daily as needed. 08/14/2022 For migraines.   betamethasone  valerate (VALISONE ) 0.1 % cream, Apply 1 application  topically daily as needed (irritation).   diazepam  (VALIUM ) 5 MG tablet, Take 5 mg by mouth 2 (two) times daily as needed for anxiety.   fluocinonide (LIDEX) 0.05 % external solution, Apply 1 application. topically 2 (two) times daily.   hydroxypropyl methylcellulose / hypromellose  (ISOPTO  TEARS / GONIOVISC) 2.5 % ophthalmic solution, Place 1 drop into both eyes 3 (three) times daily as needed for dry eyes.   Insulin  Pen Needle (BD PEN NEEDLE NANO 2ND GEN) 32G X 4 MM MISC, USE 1 ONCE DAILY WITH TRESIBA; Duration: 90   Insulin  Pen Needle (NOVOFINE PEN NEEDLE) 32G X 6 MM MISC, See admin instructions.   lansoprazole (PREVACID) 30 MG capsule, Take 30 mg by mouth 2 (two) times daily.   metoCLOPramide  (REGLAN ) 10 MG tablet, TAKE 1 TABLET BY MOUTH EVERY 12 HOURS TAKE  1  ABOUT  HALF  HOUR  BEFORE  LUNCH  AND  THEN  TAKE  1  AT  BEDTIME   metroNIDAZOLE (METROGEL) 0.75 % gel, Apply 1 application. topically 2 (two) times daily.   Multiple Vitamins-Minerals (CENTRUM SILVER 50+WOMEN) TABS, as directed Orally   saccharomyces boulardii (FLORASTOR) 250 MG capsule, Take 250 mg by mouth in the morning, at noon, and at bedtime.   sertraline (ZOLOFT) 50 MG tablet, Take 50 mg by mouth daily.   Simethicone (GAS FREE EXTRA STRENGTH PO), Take 2 tablets by mouth at bedtime.   tobramycin (TOBREX) 0.3 % ophthalmic solution, Place 1 drop into both eyes See admin instructions. Instill one drop into the affected eye four times a day prior and the day after eye injection.   triamcinolone (KENALOG) 0.025 % cream, Apply 1 application. topically 2 (two) times daily.  Medical History:  Past Medical History:  Diagnosis Date   Anemia of renal disease 09/24/2013   Anxiety    Arthritis    Chronic renal insufficiency 09/24/2013   Diabetes mellitus without complication (HCC)    Type II   Gastroparesis    GERD (gastroesophageal reflux disease)    Headache    migrains   History of hiatal hernia    Hypertension    PONV (postoperative nausea and vomiting)    Sleep apnea    Has not used the Cpap since having gastric bypass surgery.   Ulcer    Allergies:  Allergies  Allergen Reactions   Ace Inhibitors Other (See Comments) and Anaphylaxis    Angioedema   Aspirin Other (See Comments)    Cannot take due to  gastric bypass surgery   Farxiga [Dapagliflozin] Nausea And Vomiting and Other (See Comments)    Dizziness, tingling in upper extremities.   Nsaids Anaphylaxis and Other (See Comments)    Cannot take orally due to s/p gastric bypass   Sular [Nisoldipine Er] Other (See Comments)    angioedema   Nisoldipine Swelling and Other (See Comments)   Other Other (See Comments)   Boniva [Ibandronate] Other (See Comments)    Joint pain     Surgical History:  She  has a past surgical history that includes Abdominal hysterectomy (1992); Gastric bypass (2009); Carpal tunnel release (Bilateral); Cholecystectomy (N/A, 05/11/2016); Breast biopsy (Left, 03/26/2017); Eye surgery; Cardiac catheterization; Partial knee arthroplasty (Right, 05/04/2020); and Trigger finger release (Left, 10/05/2021). Family History:  Her family history includes Atrial fibrillation in her mother; Heart attack in her father; Stroke in her maternal grandmother; Sudden Cardiac Death in her father.  REVIEW OF SYSTEMS  : All other systems reviewed and negative except where noted in the History of Present Illness.  PHYSICAL EXAM: Ht 5' 1 (1.549 m)   Wt 166 lb (75.3 kg)   BMI 31.37 kg/m  Physical Exam   GENERAL APPEARANCE: Well nourished, in no apparent distress HEENT: No cervical lymphadenopathy, unremarkable thyroid, sclerae anicteric, conjunctiva pink RESPIRATORY: Respiratory  effort normal, BS equal bilateral without rales, rhonchi, wheezing CARDIO: RRR with no MRGs, peripheral pulses intact ABDOMEN: Soft, non distended, active bowel sounds in all 4 quadrants, no tenderness to palpation, no rebound, no mass appreciated RECTAL: declines MUSCULOSKELETAL: Full ROM, normal gait, without edema SKIN: Dry, intact without rashes or lesions. No jaundice. NEURO: Alert, oriented, no focal deficits PSYCH: Cooperative, normal mood and affect. EXTREMITIES: No edema      Alan JONELLE Coombs, PA-C 2:21 PM

## 2024-03-31 ENCOUNTER — Encounter: Payer: Self-pay | Admitting: Hematology & Oncology

## 2024-03-31 ENCOUNTER — Other Ambulatory Visit (INDEPENDENT_AMBULATORY_CARE_PROVIDER_SITE_OTHER)

## 2024-03-31 ENCOUNTER — Ambulatory Visit: Admitting: Physician Assistant

## 2024-03-31 ENCOUNTER — Encounter: Payer: Self-pay | Admitting: Physician Assistant

## 2024-03-31 VITALS — Ht 61.0 in | Wt 166.0 lb

## 2024-03-31 DIAGNOSIS — K219 Gastro-esophageal reflux disease without esophagitis: Secondary | ICD-10-CM | POA: Diagnosis not present

## 2024-03-31 DIAGNOSIS — R7989 Other specified abnormal findings of blood chemistry: Secondary | ICD-10-CM

## 2024-03-31 DIAGNOSIS — Z9884 Bariatric surgery status: Secondary | ICD-10-CM | POA: Diagnosis not present

## 2024-03-31 DIAGNOSIS — K760 Fatty (change of) liver, not elsewhere classified: Secondary | ICD-10-CM

## 2024-03-31 NOTE — Patient Instructions (Addendum)
 Your provider has requested that you go to the basement level for lab work before leaving today. Press B on the elevator. The lab is located at the first door on the left as you exit the elevator.  You have been scheduled for an abdominal ultrasound at [facility] (1st floor of hospital) on 04/09/2024 at 8:00am. Please arrive 15 minutes prior to your appointment for registration. Make certain not to have anything to eat or drink after midnight prior to your appointment. Should you need to reschedule your appointment, please contact radiology at [phone]. This test typically takes about 30 minutes to perform.   Metabolic dysfunction associated seatohepatitis  Now the leading cause of liver failure in the [place] .  It is normally from such risk factors as obesity, diabetes, insulin  resistance, high cholesterol, or metabolic syndrome.  The only definitive therapy is weight loss and exercise.   Suggest walking 20-30 mins daily.  Decreasing carbohydrates, increasing veggies.    Fatty Liver Fatty liver is the accumulation of fat in liver cells. It is also called hepatosteatosis or steatohepatitis. It is normal for your liver to contain some fat. If fat is more than 5 to 10% of your liver's weight, you have fatty liver.  There are often no symptoms (problems) for years while damage is still occurring. People often learn about their fatty liver when they have medical tests for other reasons. Fat can damage your liver for years or even decades without causing problems. When it becomes severe, it can cause fatigue, weight loss, weakness, and confusion. This makes you more likely to develop more serious liver problems. The liver is the largest organ in the body. It does a lot of work and often gives no warning signs when it is sick until late in a disease. The liver has many important jobs including: Breaking down foods. Storing vitamins, iron, and other minerals. Making  proteins. Making bile for food digestion. Breaking down many products including medications, alcohol and some poisons.  PROGNOSIS  Fatty liver may cause no damage or it can lead to an inflammation of the liver. This is, called steatohepatitis.  Over time the liver may become scarred and hardened. This condition is called cirrhosis. Cirrhosis is serious and may lead to liver failure or cancer. NASH is one of the leading causes of cirrhosis. About 10-20% of Americans have fatty liver and a smaller 2-5% has NASH.  TREATMENT  Weight loss, fat restriction, and exercise in overweight patients produces inconsistent results but is worth trying. Good control of diabetes may reduce fatty liver. Eat a balanced, healthy diet. Increase your physical activity. There are no medical or surgical treatments for a fatty liver or NASH, but improving your diet and increasing your exercise may help prevent or reverse some of the damage.     Gastroparesis Gastroparesis is a condition in which food takes longer than normal to empty from the stomach.  This condition is also known as delayed gastric emptying. It is usually a long-term (chronic) condition.  What are the signs or symptoms? Symptoms of this condition include: Feeling full after eating very little or a loss of appetite. Nausea, vomiting, or heartburn. Bloating of your abdomen. Inconsistent blood sugar (glucose) levels on blood tests. Unexplained weight loss. Acid from the stomach coming up into the esophagus (gastroesophageal reflux). Sudden tightening (spasm) of the stomach, which can be painful. Symptoms may come and go. Some people may not notice any symptoms.  What increases the risk? You are more  likely to develop this condition if: You have certain disorders or diseases. These may include: An endocrine disorder. An eating disorder. Amyloidosis. Scleroderma. Parkinson's disease. Multiple sclerosis. Cancer or infection of the stomach  or the vagus nerve. You have had surgery on your stomach or vagus nerve. You take certain medicines. You are female.  Things you can do: Please do small frequent meals like 4-6 meals a day.  Eat and drink liquids at separate times.  Avoid high fiber foods, cook your vegetables, avoid high fat food.  Suggest spreading protein throughout the day (greek yogurt, glucerna, soft meat, milk, eggs) Choose soft foods that you can mash with a fork When you are more symptomatic, change to pureed foods foods and liquids.  Consider reading Living well with Gastroparesis by [facility] Check out this link to a diet online https://my.GroupJournal.fr  Reglan  or metoclopramide   Can be used for gastroparesis or slow stomach, nausea, vomiting, GERD.   Continue the medication as needed up to 3 times a day, on an empty stomach 30 minutes before eating. It may take a few weeks for your stomach condition to start to get better. However, do not take this medicine for longer than 12 weeks.  The longer you take this medicine, and the more you take it, the greater your chances are of developing serious side effects.  Some people may get a severe muscle problem called tardive dyskinesia. This problem may lessen or go away after stopping this drug, but it may not go away. The risk is greater with diabetes and in older adults, especially older females. The risk is greater with longer use or higher doses, but it may also occur after short-term use with low doses. Call your doctor right away if you have trouble controlling body movements or problems with your tongue, face, mouth, or jaw like tongue sticking out, puffing cheeks, mouth puckering, or chewing.   Please monitor for worsening depression or thoughts of suicide, any aggressiveness or hyperactivity.  If this happen please stop and call your physician right away.

## 2024-04-01 LAB — CBC WITH DIFFERENTIAL/PLATELET
Basophils Absolute: 0.1 K/uL (ref 0.0–0.1)
Basophils Relative: 0.7 % (ref 0.0–3.0)
Eosinophils Absolute: 0.1 K/uL (ref 0.0–0.7)
Eosinophils Relative: 1.3 % (ref 0.0–5.0)
HCT: 38.8 % (ref 36.0–46.0)
Hemoglobin: 12.5 g/dL (ref 12.0–15.0)
Lymphocytes Relative: 17.7 % (ref 12.0–46.0)
Lymphs Abs: 1.6 K/uL (ref 0.7–4.0)
MCHC: 32.2 g/dL (ref 30.0–36.0)
MCV: 88.8 fl (ref 78.0–100.0)
Monocytes Absolute: 0.8 K/uL (ref 0.1–1.0)
Monocytes Relative: 8.4 % (ref 3.0–12.0)
Neutro Abs: 6.6 K/uL (ref 1.4–7.7)
Neutrophils Relative %: 71.9 % (ref 43.0–77.0)
Platelets: 241 K/uL (ref 150.0–400.0)
RBC: 4.36 Mil/uL (ref 3.87–5.11)
RDW: 16.5 % — ABNORMAL HIGH (ref 11.5–15.5)
WBC: 9.2 K/uL (ref 4.0–10.5)

## 2024-04-01 LAB — BASIC METABOLIC PANEL WITH GFR
BUN: 54 mg/dL — ABNORMAL HIGH (ref 6–23)
CO2: 17 meq/L — ABNORMAL LOW (ref 19–32)
Calcium: 10.4 mg/dL (ref 8.4–10.5)
Chloride: 106 meq/L (ref 96–112)
Creatinine, Ser: 1.75 mg/dL — ABNORMAL HIGH (ref 0.40–1.20)
GFR: 29.34 mL/min — ABNORMAL LOW (ref >60.00)
Glucose, Bld: 84 mg/dL (ref 70–99)
Potassium: 4.9 meq/L (ref 3.5–5.1)
Sodium: 143 meq/L (ref 135–145)

## 2024-04-01 LAB — HEPATIC FUNCTION PANEL
ALT: 42 U/L — ABNORMAL HIGH (ref 0–35)
AST: 37 U/L (ref 0–37)
Albumin: 4.6 g/dL (ref 3.5–5.2)
Alkaline Phosphatase: 158 U/L — ABNORMAL HIGH (ref 39–117)
Bilirubin, Direct: 0 mg/dL (ref 0.0–0.3)
Total Bilirubin: 0.3 mg/dL (ref 0.2–1.2)
Total Protein: 8.3 g/dL (ref 6.0–8.3)

## 2024-04-01 LAB — GAMMA GT: GGT: 17 U/L (ref 7–51)

## 2024-04-03 ENCOUNTER — Ambulatory Visit: Payer: Self-pay | Admitting: Physician Assistant

## 2024-04-03 LAB — ANA: Anti Nuclear Antibody (ANA): NEGATIVE

## 2024-04-03 LAB — HEPATITIS A ANTIBODY, TOTAL: Hepatitis A AB,Total: NONREACTIVE

## 2024-04-03 LAB — ANTI-SMOOTH MUSCLE ANTIBODY, IGG: Actin (Smooth Muscle) Antibody (IGG): <20 U (ref <20)

## 2024-04-03 LAB — IGA: Immunoglobulin A: 272 mg/dL (ref 70–320)

## 2024-04-03 LAB — HEPATITIS C ANTIBODY: Hepatitis C Ab: NONREACTIVE

## 2024-04-03 LAB — IGG: IgG (Immunoglobin G), Serum: 1268 mg/dL (ref 600–1540)

## 2024-04-03 LAB — TISSUE TRANSGLUTAMINASE, IGA: (tTG) Ab, IgA: <1 U/mL

## 2024-04-03 LAB — HEPATITIS B SURFACE ANTIGEN: Hepatitis B Surface Ag: NONREACTIVE

## 2024-04-03 LAB — HEPATITIS B SURFACE ANTIBODY,QUALITATIVE: Hep B S Ab: NONREACTIVE

## 2024-04-03 LAB — MITOCHONDRIAL ANTIBODIES: Mitochondrial M2 Ab, IgG: <=20 U (ref <=20.0)

## 2024-04-09 ENCOUNTER — Ambulatory Visit (HOSPITAL_COMMUNITY)
Admission: RE | Admit: 2024-04-09 | Discharge: 2024-04-09 | Disposition: A | Discharge status: Home / Self Care | Source: Ambulatory Visit | Attending: Physician Assistant | Admitting: Physician Assistant

## 2024-04-09 DIAGNOSIS — R7989 Other specified abnormal findings of blood chemistry: Secondary | ICD-10-CM | POA: Insufficient documentation

## 2024-04-16 ENCOUNTER — Other Ambulatory Visit: Payer: Self-pay | Admitting: Family Medicine

## 2024-04-16 ENCOUNTER — Other Ambulatory Visit: Payer: Self-pay | Admitting: Hematology & Oncology

## 2024-04-16 DIAGNOSIS — Z1231 Encounter for screening mammogram for malignant neoplasm of breast: Secondary | ICD-10-CM

## 2024-04-28 ENCOUNTER — Encounter: Payer: Self-pay | Admitting: Radiology

## 2024-05-06 ENCOUNTER — Ambulatory Visit

## 2024-05-06 ENCOUNTER — Ambulatory Visit: Admitting: Medical Oncology

## 2024-05-06 ENCOUNTER — Inpatient Hospital Stay

## 2024-05-12 ENCOUNTER — Other Ambulatory Visit: Payer: Self-pay | Admitting: Medical Oncology

## 2024-05-12 DIAGNOSIS — N189 Chronic kidney disease, unspecified: Secondary | ICD-10-CM

## 2024-05-12 DIAGNOSIS — D508 Other iron deficiency anemias: Secondary | ICD-10-CM

## 2024-05-12 DIAGNOSIS — N2889 Other specified disorders of kidney and ureter: Secondary | ICD-10-CM

## 2024-05-12 DIAGNOSIS — D500 Iron deficiency anemia secondary to blood loss (chronic): Secondary | ICD-10-CM

## 2024-05-12 DIAGNOSIS — Z9884 Bariatric surgery status: Secondary | ICD-10-CM

## 2024-05-13 ENCOUNTER — Inpatient Hospital Stay

## 2024-05-13 ENCOUNTER — Encounter: Payer: Self-pay | Admitting: Medical Oncology

## 2024-05-13 ENCOUNTER — Inpatient Hospital Stay (HOSPITAL_BASED_OUTPATIENT_CLINIC_OR_DEPARTMENT_OTHER): Admitting: Medical Oncology

## 2024-05-13 ENCOUNTER — Ambulatory Visit

## 2024-05-13 ENCOUNTER — Inpatient Hospital Stay: Attending: Hematology & Oncology

## 2024-05-13 ENCOUNTER — Ambulatory Visit: Admitting: Medical Oncology

## 2024-05-13 VITALS — BP 144/54 | HR 52 | Temp 97.8°F | Resp 18 | Ht 61.0 in | Wt 163.1 lb

## 2024-05-13 DIAGNOSIS — N2889 Other specified disorders of kidney and ureter: Secondary | ICD-10-CM

## 2024-05-13 DIAGNOSIS — Z794 Long term (current) use of insulin: Secondary | ICD-10-CM | POA: Insufficient documentation

## 2024-05-13 DIAGNOSIS — D508 Other iron deficiency anemias: Secondary | ICD-10-CM | POA: Diagnosis not present

## 2024-05-13 DIAGNOSIS — D631 Anemia in chronic kidney disease: Secondary | ICD-10-CM

## 2024-05-13 DIAGNOSIS — D509 Iron deficiency anemia, unspecified: Secondary | ICD-10-CM | POA: Insufficient documentation

## 2024-05-13 DIAGNOSIS — D500 Iron deficiency anemia secondary to blood loss (chronic): Secondary | ICD-10-CM | POA: Diagnosis not present

## 2024-05-13 DIAGNOSIS — N189 Chronic kidney disease, unspecified: Secondary | ICD-10-CM

## 2024-05-13 DIAGNOSIS — E039 Hypothyroidism, unspecified: Secondary | ICD-10-CM | POA: Insufficient documentation

## 2024-05-13 DIAGNOSIS — Z9884 Bariatric surgery status: Secondary | ICD-10-CM

## 2024-05-13 DIAGNOSIS — Z79899 Other long term (current) drug therapy: Secondary | ICD-10-CM | POA: Insufficient documentation

## 2024-05-13 DIAGNOSIS — E119 Type 2 diabetes mellitus without complications: Secondary | ICD-10-CM | POA: Insufficient documentation

## 2024-05-13 LAB — CMP (CANCER CENTER ONLY)
ALT: 36 U/L (ref 0–44)
AST: 33 U/L (ref 15–41)
Albumin: 4.5 g/dL (ref 3.5–5.0)
Alkaline Phosphatase: 162 U/L — ABNORMAL HIGH (ref 38–126)
Anion gap: 11 (ref 5–15)
BUN: 59 mg/dL — ABNORMAL HIGH (ref 8–23)
CO2: 22 mmol/L (ref 22–32)
Calcium: 9.7 mg/dL (ref 8.9–10.3)
Chloride: 102 mmol/L (ref 98–111)
Creatinine: 1.54 mg/dL — ABNORMAL HIGH (ref 0.44–1.00)
GFR, Estimated: 36 mL/min — ABNORMAL LOW (ref >60)
Glucose, Bld: 176 mg/dL — ABNORMAL HIGH (ref 70–99)
Potassium: 5 mmol/L (ref 3.5–5.1)
Sodium: 136 mmol/L (ref 135–145)
Total Bilirubin: 0.3 mg/dL (ref 0.0–1.2)
Total Protein: 7.4 g/dL (ref 6.5–8.1)

## 2024-05-13 LAB — CBC WITH DIFFERENTIAL (CANCER CENTER ONLY)
Abs Immature Granulocytes: 0.08 K/uL — ABNORMAL HIGH (ref 0.00–0.07)
Basophils Absolute: 0 K/uL (ref 0.0–0.1)
Basophils Relative: 0 %
Eosinophils Absolute: 0.1 K/uL (ref 0.0–0.5)
Eosinophils Relative: 1 %
HCT: 36.3 % (ref 36.0–46.0)
Hemoglobin: 11.3 g/dL — ABNORMAL LOW (ref 12.0–15.0)
Immature Granulocytes: 1 %
Lymphocytes Relative: 16 %
Lymphs Abs: 1.5 K/uL (ref 0.7–4.0)
MCH: 28.5 pg (ref 26.0–34.0)
MCHC: 31.1 g/dL (ref 30.0–36.0)
MCV: 91.4 fL (ref 80.0–100.0)
Monocytes Absolute: 0.7 K/uL (ref 0.1–1.0)
Monocytes Relative: 8 %
Neutro Abs: 6.7 K/uL (ref 1.7–7.7)
Neutrophils Relative %: 74 %
Platelet Count: 233 K/uL (ref 150–400)
RBC: 3.97 MIL/uL (ref 3.87–5.11)
RDW: 14.4 % (ref 11.5–15.5)
WBC Count: 9.1 K/uL (ref 4.0–10.5)
nRBC: 0 % (ref 0.0–0.2)

## 2024-05-13 LAB — IRON AND IRON BINDING CAPACITY (CC-WL,HP ONLY)
Iron: 56 ug/dL (ref 28–170)
Saturation Ratios: 19 % (ref 10.4–31.8)
TIBC: 294 ug/dL (ref 250–450)
UIBC: 238 ug/dL

## 2024-05-13 LAB — RETICULOCYTES
Immature Retic Fract: 5.9 % (ref 2.3–15.9)
RBC.: 3.92 MIL/uL (ref 3.87–5.11)
Retic Count, Absolute: 41.2 K/uL (ref 19.0–186.0)
Retic Ct Pct: 1.1 % (ref 0.4–3.1)

## 2024-05-13 LAB — LACTATE DEHYDROGENASE: LDH: 178 U/L (ref 105–235)

## 2024-05-13 LAB — VITAMIN B12: Vitamin B-12: 499 pg/mL (ref 180–914)

## 2024-05-13 LAB — FERRITIN: Ferritin: 599 ng/mL — ABNORMAL HIGH (ref 11–307)

## 2024-05-13 NOTE — Progress Notes (Signed)
 Hematology and Oncology Follow Up Visit  Kelly Soto [id number] [birth year] 68 y.o. 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  Since Kelly Soto last visit Kelly Soto was seen by Kelly Soto for evaluation of Kelly Soto elevated LFTs. Kelly Soto had full work up and Kelly Soto was thrilled with this visit. It was suspected that Kelly Soto Kelly Soto may have been the case so this was stopped and is planning on starting back on the Prolia.   Overall doing well. Recent A1C after getting Kelly Soto insulin  pump was 6.6. Kelly Soto is thrilled with this.   Kelly Soto is on Levothyroxine for hypothyroidism which was recently discovered. Kelly Soto recent TSH was normal.   At Kelly Soto last visit an abdominal US  was ordered following an elevated alk phos level of 190. This US  showed some hepatic steatosis, suspected hepatic steatosis, CKD. Kelly Soto was referred to gastroenterology and will avoid tylenol , ETOH, and try to follow a low fat diet.   Kelly Soto has had no change in bowel or bladder habits.  Kelly Soto has had no cough.  There has been no issues with COVID.No bleeding.   Kelly Soto has had no leg swelling.  Kelly Soto has had no neuropathy.  Kelly Soto due for an eye exam.    Overall, I would say that Kelly Soto performance status is ECOG 1.   Wt Readings from Last 3 Encounters:  05/13/24 163 lb 1.3 oz (74 kg)  03/31/24 166 lb (75.3 kg)  03/19/24 164 lb (74.4 kg)    Medications:  Current Outpatient Medications:    amLODipine (NORVASC) 10 MG tablet, Take 10 mg by mouth daily., Disp: , Rfl:    Ascorbic Acid (VITAMIN C) 500 MG CAPS, Take 1 capsule by mouth daily., Disp: , Rfl:    baclofen (LIORESAL) 10 MG tablet, 5 mg daily as needed. 08/14/2022 For migraines., Disp: , Rfl:    BAQSIMI ONE  PACK 3 MG/DOSE POWD, Place 1 spray into both nostrils as needed., Disp: , Rfl:    betamethasone  valerate (VALISONE ) 0.1 % cream, Apply 1 application  topically daily as needed (irritation)., Disp: , Rfl:    carvedilol (COREG) 25 MG tablet, Take 25 mg by mouth 2 (two) times daily with a meal., Disp: , Rfl:    chlorthalidone (HYGROTON) 25 MG tablet, Take 25 mg by mouth daily., Disp: , Rfl:    diazepam  (VALIUM ) 5 MG tablet, Take 5 mg by mouth 2 (two) times daily as needed for anxiety., Disp: , Rfl: 0   fluocinonide (LIDEX) 0.05 % external solution, Apply 1 application. topically 2 (two) times daily., Disp: , Rfl:    fluticasone (FLONASE) 50 MCG/ACT nasal spray, Place 2 sprays into both nostrils at bedtime., Disp: , Rfl:    Galcanezumab-gnlm (EMGALITY) 120 MG/ML SOAJ, Inject 120 mg into the skin every 30 (thirty) days., Disp: , Rfl:    hydroxypropyl methylcellulose / hypromellose (ISOPTO TEARS / GONIOVISC) 2.5 % ophthalmic solution, Place 1 drop into both eyes 3 (three) times daily as needed for dry eyes., Disp: , Rfl:    Insulin  Pen Needle (BD PEN NEEDLE NANO 2ND GEN) 32G X 4 MM MISC, USE 1 ONCE DAILY WITH TRESIBA; Duration: 90, Disp: , Rfl:    Insulin  Pen Needle (NOVOFINE PEN  NEEDLE) 32G X 6 MM MISC, See admin instructions., Disp: , Rfl:    JARDIANCE 25 MG TABS tablet, Take 25 mg by mouth daily., Disp: , Rfl:    lansoprazole (PREVACID) 30 MG capsule, Take 30 mg by mouth 2 (two) times daily., Disp: , Rfl:    levothyroxine (SYNTHROID) 75 MCG tablet, Take 75 mcg by mouth daily before breakfast., Disp: , Rfl:    metoCLOPramide  (REGLAN ) 10 MG tablet, TAKE 1 TABLET BY MOUTH EVERY 12 HOURS TAKE  1  TABLET  ABOUT  HALF  HOUR  BEFORE  LUNCH  AND  THEN  TAKE  1  TABLET  AT  BEDTIME., Disp: 60 tablet, Rfl: 0   metroNIDAZOLE (METROGEL) 0.75 % gel, Apply 1 application. topically 2 (two) times daily., Disp: , Rfl:    Multiple Vitamins-Minerals (CENTRUM SILVER 50+WOMEN) TABS, as directed Orally, Disp: , Rfl:     NOVOLOG  FLEXPEN 100 UNIT/ML FlexPen, Inject into the skin., Disp: , Rfl:    NOVOLOG  RELION 100 UNIT/ML injection, Inject 40 Units into the skin daily., Disp: , Rfl:    saccharomyces boulardii (FLORASTOR) 250 MG capsule, Take 250 mg by mouth in the morning, at noon, and at bedtime., Disp: , Rfl:    sertraline (ZOLOFT) 50 MG tablet, Take 50 mg by mouth daily., Disp: , Rfl:    Simethicone (GAS FREE EXTRA STRENGTH PO), Take 2 tablets by mouth at bedtime., Disp: , Rfl:    sodium chloride  (OCEAN) 0.65 % nasal spray, 2 sprays, Disp: , Rfl:    tobramycin (TOBREX) 0.3 % ophthalmic solution, Place 1 drop into both eyes See admin instructions. Instill one drop into the affected eye four times a day prior and the day after eye injection., Disp: , Rfl:    TRESIBA FLEXTOUCH 100 UNIT/ML SOPN FlexTouch Pen, Inject 13 Units into the skin daily., Disp: , Rfl:    triamcinolone (KENALOG) 0.025 % cream, Apply 1 application. topically 2 (two) times daily., Disp: , Rfl:   Allergies:  Allergies  Allergen Reactions   Ace Inhibitors Other (See Comments) and Anaphylaxis    Angioedema   Aspirin Other (See Comments)    Cannot take due to gastric bypass surgery   Farxiga [Dapagliflozin] Nausea And Vomiting and Other (See Comments)    Dizziness, tingling in upper extremities.   Nsaids Anaphylaxis and Other (See Comments)    Cannot take orally due to s/p gastric bypass   Sular [Nisoldipine Er] Other (See Comments)    angioedema   Nisoldipine Swelling and Other (See Comments)   Other Other (See Comments)   Boniva [Ibandronate] Other (See Comments)    Joint pain    Past Medical History, Surgical history, Social history, and Family History were reviewed and updated.  Review of Systems: Review of Systems  Constitutional: Negative.   HENT: Negative.    Eyes: Negative.   Respiratory: Negative.    Cardiovascular: Negative.   Gastrointestinal: Negative.   Genitourinary: Negative.   Musculoskeletal: Negative.    Skin: Negative.   Neurological: Negative.   Endo/Heme/Allergies: Negative.   Psychiatric/Behavioral: Negative.      Physical Exam:  height is 5' 1 (1.549 m) and weight is 163 lb 1.3 oz (74 kg). Kelly Soto oral temperature is 97.8 F (36.6 C). Kelly Soto blood pressure is 144/54 (abnormal) and Kelly Soto pulse is 52 (abnormal). Kelly Soto respiration is 18 and oxygen saturation is 100%.   Physical Exam Vitals reviewed.  HENT:     Head: Normocephalic and atraumatic.  Eyes:  Pupils: Pupils are equal, round, and reactive to light.  Cardiovascular:     Rate and Rhythm: Normal rate and regular rhythm.     Heart sounds: Normal heart sounds.  Pulmonary:     Effort: Pulmonary effort is normal.     Breath sounds: Normal breath sounds.  Abdominal:     General: Bowel sounds are normal.     Palpations: Abdomen is soft.  Musculoskeletal:        General: No tenderness or deformity. Normal range of motion.     Cervical back: Normal range of motion.  Lymphadenopathy:     Cervical: No cervical adenopathy.  Skin:    General: Skin is warm and dry.     Findings: No erythema or rash.  Neurological:     Mental Status: Kelly Soto is alert and oriented to person, place, and time.  Psychiatric:        Behavior: Behavior normal.        Thought Content: Thought content normal.        Judgment: Judgment normal.      Lab Results  Component Value Date   WBC 9.1 05/13/2024   HGB 11.3 (L) 05/13/2024   HCT 36.3 05/13/2024   MCV 91.4 05/13/2024   PLT 233 05/13/2024     Chemistry      Component Value Date/Time   NA 143 03/31/2024 1412   NA 145 05/10/2017 0748   NA 137 12/06/2016 0746   K 4.9 03/31/2024 1412   K 4.7 05/10/2017 0748   K 5.2 (H) 12/06/2016 0746   CL 106 03/31/2024 1412   CL 110 (H) 05/10/2017 0748   CO2 17 (L) 03/31/2024 1412   CO2 23 05/10/2017 0748   CO2 23 12/06/2016 0746   BUN 54 (H) 03/31/2024 1412   BUN 28 (H) 05/10/2017 0748   BUN 26.1 (H) 12/06/2016 0746   CREATININE 1.75 (H) 03/31/2024 1412    CREATININE 1.39 (H) 03/19/2024 0908   CREATININE 1.2 05/10/2017 0748   CREATININE 1.2 (H) 12/06/2016 0746      Component Value Date/Time   CALCIUM 10.4 03/31/2024 1412   CALCIUM 9.1 05/10/2017 0748   CALCIUM 9.1 12/06/2016 0746   ALKPHOS 158 (H) 03/31/2024 1412   ALKPHOS 61 05/10/2017 0748   ALKPHOS 41 12/06/2016 0746   AST 37 03/31/2024 1412   AST 56 (H) 03/19/2024 0908   AST 23 12/06/2016 0746   ALT 42 (H) 03/31/2024 1412   ALT 68 (H) 03/19/2024 0908   ALT 31 05/10/2017 0748   ALT 26 12/06/2016 0746   BILITOT 0.3 03/31/2024 1412   BILITOT 0.3 03/19/2024 0908   BILITOT 0.26 12/06/2016 0746     Encounter Diagnoses  Name Primary?   Other iron deficiency anemia Yes   Anemia of renal disease    Iron deficiency anemia due to chronic blood loss    Chronic renal impairment, stage 3a    H/O gastric bypass    Impression and Plan: Kelly Soto. Kelly Soto has diabetes and hypothyroidism. Kelly Soto has iron deficiency anemia. Kelly Soto has a very low erythropoietin  level.   At Kelly Soto last visit LFTS were elevated. Kelly Soto reports that Kelly Soto did have these rechecked and the values did reduce.  Today LFTs are back down to normal levels aside from Alk Phos which is stable. Kelly Soto will continue follow up with GI CBC shows a Hgb of 11.3- no aranesp  needed Iron pending B12, folate pending.   No Aranesp  needed RTC 6 weeks  MD, labs(CBC w/, CMP, iron, ferritin, retic) , Aranesp     Kelly CHRISTELLA Dais, PA-C 11/18/202511:33 AM

## 2024-05-14 ENCOUNTER — Ambulatory Visit: Payer: Self-pay | Admitting: Medical Oncology

## 2024-05-14 ENCOUNTER — Ambulatory Visit
Admission: RE | Admit: 2024-05-14 | Discharge: 2024-05-14 | Disposition: A | Discharge status: Home / Self Care | Source: Ambulatory Visit | Attending: Family Medicine | Admitting: Family Medicine

## 2024-05-14 DIAGNOSIS — Z1231 Encounter for screening mammogram for malignant neoplasm of breast: Secondary | ICD-10-CM

## 2024-06-23 ENCOUNTER — Other Ambulatory Visit: Payer: Self-pay

## 2024-06-23 DIAGNOSIS — D508 Other iron deficiency anemias: Secondary | ICD-10-CM

## 2024-06-23 DIAGNOSIS — N2889 Other specified disorders of kidney and ureter: Secondary | ICD-10-CM

## 2024-06-23 DIAGNOSIS — D631 Anemia in chronic kidney disease: Secondary | ICD-10-CM

## 2024-06-23 DIAGNOSIS — Z9884 Bariatric surgery status: Secondary | ICD-10-CM

## 2024-06-23 DIAGNOSIS — D500 Iron deficiency anemia secondary to blood loss (chronic): Secondary | ICD-10-CM

## 2024-06-24 ENCOUNTER — Encounter: Payer: Self-pay | Admitting: Hematology & Oncology

## 2024-06-24 ENCOUNTER — Inpatient Hospital Stay: Attending: Hematology & Oncology

## 2024-06-24 ENCOUNTER — Inpatient Hospital Stay (HOSPITAL_BASED_OUTPATIENT_CLINIC_OR_DEPARTMENT_OTHER): Admitting: Hematology & Oncology

## 2024-06-24 ENCOUNTER — Inpatient Hospital Stay

## 2024-06-24 ENCOUNTER — Other Ambulatory Visit: Payer: Self-pay

## 2024-06-24 VITALS — BP 140/55 | HR 62 | Temp 98.3°F | Resp 18 | Ht 61.0 in | Wt 166.0 lb

## 2024-06-24 DIAGNOSIS — Z79899 Other long term (current) drug therapy: Secondary | ICD-10-CM | POA: Insufficient documentation

## 2024-06-24 DIAGNOSIS — D509 Iron deficiency anemia, unspecified: Secondary | ICD-10-CM | POA: Insufficient documentation

## 2024-06-24 DIAGNOSIS — E119 Type 2 diabetes mellitus without complications: Secondary | ICD-10-CM | POA: Diagnosis not present

## 2024-06-24 DIAGNOSIS — N2889 Other specified disorders of kidney and ureter: Secondary | ICD-10-CM

## 2024-06-24 DIAGNOSIS — D508 Other iron deficiency anemias: Secondary | ICD-10-CM

## 2024-06-24 DIAGNOSIS — Z9884 Bariatric surgery status: Secondary | ICD-10-CM | POA: Insufficient documentation

## 2024-06-24 DIAGNOSIS — D500 Iron deficiency anemia secondary to blood loss (chronic): Secondary | ICD-10-CM | POA: Diagnosis not present

## 2024-06-24 DIAGNOSIS — N189 Chronic kidney disease, unspecified: Secondary | ICD-10-CM | POA: Diagnosis not present

## 2024-06-24 DIAGNOSIS — D631 Anemia in chronic kidney disease: Secondary | ICD-10-CM | POA: Diagnosis not present

## 2024-06-24 LAB — CBC WITH DIFFERENTIAL (CANCER CENTER ONLY)
Abs Immature Granulocytes: 0.03 K/uL (ref 0.00–0.07)
Basophils Absolute: 0 K/uL (ref 0.0–0.1)
Basophils Relative: 1 %
Eosinophils Absolute: 0.1 K/uL (ref 0.0–0.5)
Eosinophils Relative: 1 %
HCT: 35.2 % — ABNORMAL LOW (ref 36.0–46.0)
Hemoglobin: 11.1 g/dL — ABNORMAL LOW (ref 12.0–15.0)
Immature Granulocytes: 0 %
Lymphocytes Relative: 15 %
Lymphs Abs: 1.3 K/uL (ref 0.7–4.0)
MCH: 28.7 pg (ref 26.0–34.0)
MCHC: 31.5 g/dL (ref 30.0–36.0)
MCV: 91 fL (ref 80.0–100.0)
Monocytes Absolute: 0.6 K/uL (ref 0.1–1.0)
Monocytes Relative: 8 %
Neutro Abs: 6.1 K/uL (ref 1.7–7.7)
Neutrophils Relative %: 75 %
Platelet Count: 235 K/uL (ref 150–400)
RBC: 3.87 MIL/uL (ref 3.87–5.11)
RDW: 14.6 % (ref 11.5–15.5)
WBC Count: 8.1 K/uL (ref 4.0–10.5)
nRBC: 0 % (ref 0.0–0.2)

## 2024-06-24 LAB — CMP (CANCER CENTER ONLY)
ALT: 28 U/L (ref 0–44)
AST: 28 U/L (ref 15–41)
Albumin: 4.2 g/dL (ref 3.5–5.0)
Alkaline Phosphatase: 170 U/L — ABNORMAL HIGH (ref 38–126)
Anion gap: 10 (ref 5–15)
BUN: 46 mg/dL — ABNORMAL HIGH (ref 8–23)
CO2: 23 mmol/L (ref 22–32)
Calcium: 9.5 mg/dL (ref 8.9–10.3)
Chloride: 102 mmol/L (ref 98–111)
Creatinine: 1.46 mg/dL — ABNORMAL HIGH (ref 0.44–1.00)
GFR, Estimated: 39 mL/min — ABNORMAL LOW
Glucose, Bld: 217 mg/dL — ABNORMAL HIGH (ref 70–99)
Potassium: 5.2 mmol/L — ABNORMAL HIGH (ref 3.5–5.1)
Sodium: 135 mmol/L (ref 135–145)
Total Bilirubin: 0.3 mg/dL (ref 0.0–1.2)
Total Protein: 7.5 g/dL (ref 6.5–8.1)

## 2024-06-24 LAB — RETICULOCYTES
Immature Retic Fract: 8.4 % (ref 2.3–15.9)
RBC.: 3.85 MIL/uL — ABNORMAL LOW (ref 3.87–5.11)
Retic Count, Absolute: 58.5 K/uL (ref 19.0–186.0)
Retic Ct Pct: 1.5 % (ref 0.4–3.1)

## 2024-06-24 LAB — IRON AND IRON BINDING CAPACITY (CC-WL,HP ONLY)
Iron: 62 ug/dL (ref 28–170)
Saturation Ratios: 20 % (ref 10.4–31.8)
TIBC: 312 ug/dL (ref 250–450)
UIBC: 250 ug/dL

## 2024-06-24 LAB — FERRITIN: Ferritin: 896 ng/mL — ABNORMAL HIGH (ref 11–307)

## 2024-06-24 NOTE — Progress Notes (Signed)
 Hematology and Oncology Follow Up Visit  SHONA PARDO [id number] 12/18/1955 68 y.o. 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  Kelly Soto is back for follow-up.  She looks fantastic.  She feels good.  She is getting ready for the holidays to be over with.  I know she is been quite busy.  She has a new great granddaughter.  She is very happy about this.  She has not needed any Aranesp  for about 4 months.  This is a record for her.  When we last saw her, her ferritin was 599 with iron saturation of 19%.  She has had no problems with bowels or bladder.  She has had no problems with her blood sugars.  There are on the high side today..  There is been no leg swelling.  She has had no rashes.  There is been no cough or shortness of breath.  Thankfully, she has avoided the Flu and COVID.  Overall, I would say that her performance status is probably ECOG 1.   Medications:  Current Outpatient Medications:    amLODipine (NORVASC) 10 MG tablet, Take 10 mg by mouth daily., Disp: , Rfl:    Ascorbic Acid (VITAMIN C) 500 MG CAPS, Take 1 capsule by mouth daily., Disp: , Rfl:    baclofen (LIORESAL) 10 MG tablet, 5 mg daily as needed. 08/14/2022 For migraines., Disp: , Rfl:    BAQSIMI ONE PACK 3 MG/DOSE POWD, Place 1 spray into both nostrils as needed., Disp: , Rfl:    betamethasone  valerate (VALISONE ) 0.1 % cream, Apply 1 application  topically daily as needed (irritation)., Disp: , Rfl:    carvedilol (COREG) 25 MG tablet, Take 25 mg by mouth 2 (two) times daily with a meal., Disp: , Rfl:    chlorthalidone (HYGROTON) 25 MG tablet, Take 25 mg by mouth daily., Disp: , Rfl:    diazepam  (VALIUM ) 5 MG tablet, Take 5 mg by mouth 2  (two) times daily as needed for anxiety., Disp: , Rfl: 0   fluocinonide (LIDEX) 0.05 % external solution, Apply 1 application. topically 2 (two) times daily., Disp: , Rfl:    fluticasone (FLONASE) 50 MCG/ACT nasal spray, Place 2 sprays into both nostrils at bedtime., Disp: , Rfl:    Galcanezumab-gnlm (EMGALITY) 120 MG/ML SOAJ, Inject 120 mg into the skin every 30 (thirty) days., Disp: , Rfl:    hydroxypropyl methylcellulose / hypromellose (ISOPTO TEARS / GONIOVISC) 2.5 % ophthalmic solution, Place 1 drop into both eyes 3 (three) times daily as needed for dry eyes., Disp: , Rfl:    Insulin  Pen Needle (BD PEN NEEDLE NANO 2ND GEN) 32G X 4 MM MISC, USE 1 ONCE DAILY WITH TRESIBA; Duration: 90, Disp: , Rfl:    Insulin  Pen Needle (NOVOFINE PEN NEEDLE) 32G X 6 MM MISC, See admin instructions., Disp: , Rfl:    JARDIANCE 25 MG TABS tablet, Take 25 mg by mouth daily., Disp: , Rfl:    lansoprazole (PREVACID) 30 MG capsule, Take 30 mg by mouth 2 (two) times daily., Disp: , Rfl:    levothyroxine (SYNTHROID) 75 MCG tablet, Take 75 mcg by mouth daily before breakfast., Disp: , Rfl:    metoCLOPramide  (REGLAN ) 10 MG tablet, TAKE 1 TABLET  BY MOUTH EVERY 12 HOURS TAKE  1  TABLET  ABOUT  HALF  HOUR  BEFORE  LUNCH  AND  THEN  TAKE  1  TABLET  AT  BEDTIME., Disp: 60 tablet, Rfl: 0   metroNIDAZOLE (METROGEL) 0.75 % gel, Apply 1 application. topically 2 (two) times daily., Disp: , Rfl:    Multiple Vitamins-Minerals (CENTRUM SILVER 50+WOMEN) TABS, as directed Orally, Disp: , Rfl:    NOVOLOG  FLEXPEN 100 UNIT/ML FlexPen, Inject into the skin., Disp: , Rfl:    NOVOLOG  RELION 100 UNIT/ML injection, Inject 40 Units into the skin daily., Disp: , Rfl:    saccharomyces boulardii (FLORASTOR) 250 MG capsule, Take 250 mg by mouth in the morning, at noon, and at bedtime., Disp: , Rfl:    sertraline (ZOLOFT) 50 MG tablet, Take 50 mg by mouth daily., Disp: , Rfl:    Simethicone (GAS FREE EXTRA STRENGTH PO), Take 2 tablets by mouth at  bedtime., Disp: , Rfl:    sodium chloride  (OCEAN) 0.65 % nasal spray, 2 sprays, Disp: , Rfl:    tobramycin (TOBREX) 0.3 % ophthalmic solution, Place 1 drop into both eyes See admin instructions. Instill one drop into the affected eye four times a day prior and the day after eye injection., Disp: , Rfl:    TRESIBA FLEXTOUCH 100 UNIT/ML SOPN FlexTouch Pen, Inject 13 Units into the skin daily., Disp: , Rfl:    triamcinolone (KENALOG) 0.025 % cream, Apply 1 application. topically 2 (two) times daily., Disp: , Rfl:   Allergies:  Allergies  Allergen Reactions   Ace Inhibitors Other (See Comments) and Anaphylaxis    Angioedema   Aspirin Other (See Comments)    Cannot take due to gastric bypass surgery   Farxiga [Dapagliflozin] Nausea And Vomiting and Other (See Comments)    Dizziness, tingling in upper extremities.   Nsaids Anaphylaxis and Other (See Comments)    Cannot take orally due to s/p gastric bypass   Sular [Nisoldipine Er] Other (See Comments)    angioedema   Nisoldipine Swelling and Other (See Comments)   Other Other (See Comments)   Boniva [Ibandronate] Other (See Comments)    Joint pain    Past Medical History, Surgical history, Social history, and Family History were reviewed and updated.  Review of Systems: Review of Systems  Constitutional: Negative.   HENT: Negative.    Eyes: Negative.   Respiratory: Negative.    Cardiovascular: Negative.   Gastrointestinal: Negative.   Genitourinary: Negative.   Musculoskeletal: Negative.   Skin: Negative.   Neurological: Negative.   Endo/Heme/Allergies: Negative.   Psychiatric/Behavioral: Negative.      Physical Exam:   Vital signs show temperature 97.8.  Pulse 52.  Blood pressure 144/54.  Weight is 163 pounds.  Physical Exam Vitals reviewed.  HENT:     Head: Normocephalic and atraumatic.  Eyes:     Pupils: Pupils are equal, round, and reactive to light.  Cardiovascular:     Rate and Rhythm: Normal rate and regular  rhythm.     Heart sounds: Normal heart sounds.  Pulmonary:     Effort: Pulmonary effort is normal.     Breath sounds: Normal breath sounds.  Abdominal:     General: Bowel sounds are normal.     Palpations: Abdomen is soft.  Musculoskeletal:        General: No tenderness or deformity. Normal range of motion.     Cervical back: Normal range of motion.  Lymphadenopathy:     Cervical:  No cervical adenopathy.  Skin:    General: Skin is warm and dry.     Findings: No erythema or rash.  Neurological:     Mental Status: She is alert and oriented to person, place, and time.  Psychiatric:        Behavior: Behavior normal.        Thought Content: Thought content normal.        Judgment: Judgment normal.      Lab Results  Component Value Date   WBC 8.1 06/24/2024   HGB 11.1 (L) 06/24/2024   HCT 35.2 (L) 06/24/2024   MCV 91.0 06/24/2024   PLT 235 06/24/2024     Chemistry      Component Value Date/Time   NA 135 06/24/2024 1022   NA 145 05/10/2017 0748   NA 137 12/06/2016 0746   K 5.2 (H) 06/24/2024 1022   K 4.7 05/10/2017 0748   K 5.2 (H) 12/06/2016 0746   CL 102 06/24/2024 1022   CL 110 (H) 05/10/2017 0748   CO2 23 06/24/2024 1022   CO2 23 05/10/2017 0748   CO2 23 12/06/2016 0746   BUN 46 (H) 06/24/2024 1022   BUN 28 (H) 05/10/2017 0748   BUN 26.1 (H) 12/06/2016 0746   CREATININE 1.46 (H) 06/24/2024 1022   CREATININE 1.2 05/10/2017 0748   CREATININE 1.2 (H) 12/06/2016 0746      Component Value Date/Time   CALCIUM 9.5 06/24/2024 1022   CALCIUM 9.1 05/10/2017 0748   CALCIUM 9.1 12/06/2016 0746   ALKPHOS 170 (H) 06/24/2024 1022   ALKPHOS 61 05/10/2017 0748   ALKPHOS 41 12/06/2016 0746   AST 28 06/24/2024 1022   AST 23 12/06/2016 0746   ALT 28 06/24/2024 1022   ALT 31 05/10/2017 0748   ALT 26 12/06/2016 0746   BILITOT 0.3 06/24/2024 1022   BILITOT 0.26 12/06/2016 0746       Impression and Plan: Ms. Azucena is 67 year old white female. She has diabetes. She  has iron deficiency anemia. She has a very low erythropoietin  level.  Very amazed that she does not need Aranesp  at this time.  I suspect though that the next time that  we see her, she probably will need Aranesp .  Her hemoglobin is trending down very slowly.  We will have to see what her iron studies look like.  I would think that they should be okay.  I would like to get her back in about 5 weeks or so.  Again, I am sure that she will need Aranesp .  Maude JONELLE Crease, MD 12/30/202511:30 AM

## 2024-06-25 ENCOUNTER — Ambulatory Visit: Payer: Self-pay | Admitting: Hematology & Oncology

## 2024-06-25 NOTE — Telephone Encounter (Signed)
-----   Message from Maude Crease, MD sent at 06/25/2024  7:09 AM EST ----- Please call and let her know that the iron level is okay.  Thanks.  Kelly Soto

## 2024-06-25 NOTE — Telephone Encounter (Signed)
 Advised via MyChart.

## 2024-07-29 ENCOUNTER — Ambulatory Visit: Admitting: Physician Assistant

## 2024-07-29 NOTE — Progress Notes (Unsigned)
 "    07/29/2024 Kelly Soto [id number] [birth year]  Referring provider: Loreli Kins, MD Primary GI doctor: Dr. San  ASSESSMENT AND PLAN:  MASH/dili Negative serological work up 2025 Elevated LFTs for 3 months initially isolated alkaline phosphatase most recent labs 03/19/2024 show AST 56 ALT 68 alk phos 174 Status post cholecystectomy 02/22/2024 ABUS fatty liver, echogenic mass superficial right liver consistent benign, spleen prominent with focus repeat abdominal ultrasound 12 months, renal medical disease 04/09/2024 elastography kPA 4.9    Latest Ref Rng & Units 06/24/2024   10:22 AM 05/13/2024   10:42 AM 03/31/2024    2:12 PM  Hepatic Function  Total Protein 6.5 - 8.1 g/dL 7.5  7.4  8.3   Albumin 3.5 - 5.0 g/dL 4.2  4.5  4.6   AST 15 - 41 U/L 28  33  37   ALT 0 - 44 U/L 28  36  42   Alk Phosphatase 38 - 126 U/L 170  162  158   Total Bilirubin 0.0 - 1.2 mg/dL 0.3  0.3  0.3   Bilirubin, Direct 0.0 - 0.3 mg/dL   0.0    Platelets 789  FIB 4 2.23= fibrosis must be excluded - need LFTs and CBC monitored every 6 months, - revaluation with imaging every 2-3 years.  -Continue to work on risk factor modification including diet exercise and control of risk factors including blood sugars.  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 Burnette for hematemesis showed normal esophagus normal pouch normal blind loop benign Schatzki ring and ulcer at gastrojejunostomy of efferent limb 09/12/2023 GES delayed  gastric emptying Discussed diet with small, frequent meals, soft diet with the patient.  Can refer to dietician and encouraged weight loss with better control of A1C -Can do short term metoclopramide -We had long discussion about risk of TD, patient expresses understanding and wishes to continue on short course of medication.   Screening colonoscopy  2022 with [facility], unremarkable  Diabetes 2  On insulin  pump  Patient Care Team: Loreli Kins, MD as PCP - General (Family Medicine)  HISTORY OF PRESENT ILLNESS: 69 y.o. female with a past medical history listed below presents for evaluation of elevated LFTs.   I last saw the patient in the office 03/31/2024 for elevated LFTs.  Discussed the use of AI scribe software for clinical note transcription with the patient, who gave verbal consent to proceed.  History of Present Illness          She  reports that she has never smoked. She has never used smokeless tobacco. She reports that she does not drink alcohol and does not use drugs.  RELEVANT GI HISTORY, IMAGING AND LABS: Results          CBC    Component Value Date/Time   WBC 8.1 06/24/2024 1022   WBC 9.2 03/31/2024 1412   RBC 3.85 (L) 06/24/2024 1022   RBC 3.87 06/24/2024 1022   HGB 11.1 (L) 06/24/2024 1022   HGB 9.1 repeated (L) 05/10/2017 0748   HGB 10.3 (  L) 01/05/2011 1415   HCT 35.2 (L) 06/24/2024 1022   HCT 28.9 (L) 05/10/2017 0748   HCT 31.3 (L) 01/05/2011 1415   PLT 235 06/24/2024 1022   PLT 231 05/10/2017 0748   PLT 231 01/05/2011 1415   MCV 91.0 06/24/2024 1022   MCV 94 05/10/2017 0748   MCV 86.7 01/05/2011 1415   MCH 28.7 06/24/2024 1022   MCHC 31.5 06/24/2024 1022   RDW 14.6 06/24/2024 1022   RDW 15.0 05/10/2017 0748   RDW 13.3 01/05/2011 1415   LYMPHSABS 1.3 06/24/2024 1022   LYMPHSABS 1.5 05/10/2017 0748   LYMPHSABS 2.7 01/05/2011 1415   MONOABS 0.6 06/24/2024 1022   MONOABS 0.6 01/05/2011 1415   EOSABS 0.1 06/24/2024 1022   EOSABS 0.1  05/10/2017 0748   BASOSABS 0.0 06/24/2024 1022   BASOSABS 0.0 05/10/2017 0748   BASOSABS 0.0 01/05/2011 1415   Recent Labs    08/20/23 0821 10/01/23 0920 11/13/23 1040 12/25/23 1013 02/05/24 0945 03/19/24 0857 03/31/24 1412 05/13/24 1042 06/24/24 1022  HGB 10.5* 11.3* 9.6* 11.9* 10.8* 12.3 12.5 11.3* 11.1*    CMP     Component Value Date/Time   NA 135 06/24/2024 1022   NA 145 05/10/2017 0748   NA 137 12/06/2016 0746   K 5.2 (H) 06/24/2024 1022   K 4.7 05/10/2017 0748   K 5.2 (H) 12/06/2016 0746   CL 102 06/24/2024 1022   CL 110 (H) 05/10/2017 0748   CO2 23 06/24/2024 1022   CO2 23 05/10/2017 0748   CO2 23 12/06/2016 0746   GLUCOSE 217 (H) 06/24/2024 1022   GLUCOSE 190 (H) 05/10/2017 0748   BUN 46 (H) 06/24/2024 1022   BUN 28 (H) 05/10/2017 0748   BUN 26.1 (H) 12/06/2016 0746   CREATININE 1.46 (H) 06/24/2024 1022   CREATININE 1.2 05/10/2017 0748   CREATININE 1.2 (H) 12/06/2016 0746   CALCIUM 9.5 06/24/2024 1022   CALCIUM 9.1 05/10/2017 0748   CALCIUM 9.1 12/06/2016 0746   PROT 7.5 06/24/2024 1022   PROT 6.5 05/10/2017 0748   PROT 6.3 (L) 12/06/2016 0746   ALBUMIN 4.2 06/24/2024 1022   ALBUMIN 3.4 05/10/2017 0748   ALBUMIN 3.5 12/06/2016 0746   AST 28 06/24/2024 1022   AST 23 12/06/2016 0746   ALT 28 06/24/2024 1022   ALT 31 05/10/2017 0748   ALT 26 12/06/2016 0746   ALKPHOS 170 (H) 06/24/2024 1022   ALKPHOS 61 05/10/2017 0748   ALKPHOS 41 12/06/2016 0746   BILITOT 0.3 06/24/2024 1022   BILITOT 0.26 12/06/2016 0746   GFRNONAA 39 (L) 06/24/2024 1022   GFRAA 60 (L) 03/16/2020 0741      Latest Ref Rng & Units 06/24/2024   10:22 AM 05/13/2024   10:42 AM 03/31/2024    2:12 PM  Hepatic Function  Total Protein 6.5 - 8.1 g/dL 7.5  7.4  8.3   Albumin 3.5 - 5.0 g/dL 4.2  4.5  4.6   AST 15 - 41 U/L 28  33  37   ALT 0 - 44 U/L 28  36  42   Alk Phosphatase 38 - 126 U/L 170  162  158   Total Bilirubin 0.0 - 1.2 mg/dL 0.3  0.3  0.3   Bilirubin, Direct 0.0 -  0.3 mg/dL   0.0       Current Medications:   Current Outpatient Medications (Endocrine & Metabolic):    BAQSIMI  ONE PACK 3 MG/DOSE POWD, Place 1 spray into both nostrils as needed.  JARDIANCE  25 MG TABS tablet, Take 25 mg by mouth daily.   levothyroxine  (SYNTHROID ) 75 MCG tablet, Take 75 mcg by mouth daily before breakfast.   NOVOLOG  FLEXPEN 100 UNIT/ML FlexPen, Inject into the skin.   NOVOLOG  RELION 100 UNIT/ML injection, Inject 40 Units into the skin daily.   TRESIBA FLEXTOUCH 100 UNIT/ML SOPN FlexTouch Pen, Inject 13 Units into the skin daily.  Current Outpatient Medications (Cardiovascular):    amLODipine (NORVASC) 10 MG tablet, Take 10 mg by mouth daily.   carvedilol  (COREG ) 25 MG tablet, Take 25 mg by mouth 2 (two) times daily with a meal.   chlorthalidone  (HYGROTON ) 25 MG tablet, Take 25 mg by mouth daily.  Current Outpatient Medications (Respiratory):    fluticasone (FLONASE) 50 MCG/ACT nasal spray, Place 2 sprays into both nostrils at bedtime.   sodium chloride  (OCEAN) 0.65 % nasal spray, 2 sprays  Current Outpatient Medications (Analgesics):    Galcanezumab-gnlm (EMGALITY) 120 MG/ML SOAJ, Inject 120 mg into the skin every 30 (thirty) days.  Current Outpatient Medications (Other):    Ascorbic Acid (VITAMIN C) 500 MG CAPS, Take 1 capsule by mouth daily.   baclofen  (LIORESAL ) 10 MG tablet, 5 mg daily as needed. 08/14/2022 For migraines.   betamethasone  valerate (VALISONE ) 0.1 % cream, Apply 1 application  topically daily as needed (irritation).   diazepam  (VALIUM ) 5 MG tablet, Take 5 mg by mouth 2 (two) times daily as needed for anxiety.   fluocinonide (LIDEX) 0.05 % external solution, Apply 1 application. topically 2 (two) times daily.   hydroxypropyl methylcellulose / hypromellose (ISOPTO TEARS / GONIOVISC) 2.5 % ophthalmic solution, Place 1 drop into both eyes 3 (three) times daily as needed for dry eyes.   Insulin  Pen Needle (BD PEN NEEDLE NANO 2ND GEN) 32G X 4 MM MISC,  USE 1 ONCE DAILY WITH TRESIBA; Duration: 90   Insulin  Pen Needle (NOVOFINE PEN NEEDLE) 32G X 6 MM MISC, See admin instructions.   lansoprazole  (PREVACID ) 30 MG capsule, Take 30 mg by mouth 2 (two) times daily.   metoCLOPramide  (REGLAN ) 10 MG tablet, TAKE 1 TABLET BY MOUTH EVERY 12 HOURS TAKE  1  TABLET  ABOUT  HALF  HOUR  BEFORE  LUNCH  AND  THEN  TAKE  1  TABLET  AT  BEDTIME.   metroNIDAZOLE  (METROGEL ) 0.75 % gel, Apply 1 application. topically 2 (two) times daily.   Multiple Vitamins-Minerals (CENTRUM SILVER 50+WOMEN) TABS, as directed Orally   saccharomyces boulardii (FLORASTOR) 250 MG capsule, Take 250 mg by mouth in the morning, at noon, and at bedtime.   sertraline  (ZOLOFT ) 50 MG tablet, Take 50 mg by mouth daily.   Simethicone (GAS FREE EXTRA STRENGTH PO), Take 2 tablets by mouth at bedtime.   tobramycin (TOBREX) 0.3 % ophthalmic solution, Place 1 drop into both eyes See admin instructions. Instill one drop into the affected eye four times a day prior and the day after eye injection.   triamcinolone  (KENALOG ) 0.025 % cream, Apply 1 application. topically 2 (two) times daily.  Medical History:  Past Medical History:  Diagnosis Date   Anemia of renal disease 09/24/2013   Anxiety    Arthritis    Chronic renal insufficiency 09/24/2013   Diabetes mellitus without complication (HCC)    Type II   Gastroparesis    GERD (gastroesophageal reflux disease)    Headache    migrains   History of hiatal hernia    Hypertension    PONV (postoperative nausea and vomiting)  Sleep apnea    Has not used the Cpap since having gastric bypass surgery.   Ulcer    Allergies:  Allergies  Allergen Reactions   Ace Inhibitors Other (See Comments) and Anaphylaxis    Angioedema   Aspirin Other (See Comments)    Cannot take due to gastric bypass surgery   Farxiga [Dapagliflozin] Nausea And Vomiting and Other (See Comments)    Dizziness, tingling in upper extremities.   Nsaids Anaphylaxis and Other (See  Comments)    Cannot take orally due to s/p gastric bypass   Sular [Nisoldipine Er] Other (See Comments)    angioedema   Nisoldipine Swelling and Other (See Comments)   Other Other (See Comments)   Boniva [Ibandronate] Other (See Comments)    Joint pain     Surgical History:  She  has a past surgical history that includes Abdominal hysterectomy (1992); Gastric bypass (2009); Carpal tunnel release (Bilateral); Cholecystectomy (N/A, 05/11/2016); Breast biopsy (Left, 03/26/2017); Eye surgery; Cardiac catheterization; Partial knee arthroplasty (Right, 05/04/2020); and Trigger finger release (Left, 10/05/2021). Family History:  Her family history includes Atrial fibrillation in her mother; Heart attack in her father; Stroke in her maternal grandmother; Sudden Cardiac Death in her father.  REVIEW OF SYSTEMS  : All other systems reviewed and negative except where noted in the History of Present Illness.  PHYSICAL EXAM: There were no vitals taken for this visit. Physical Exam          Alan JONELLE Coombs, PA-C 8:18 AM   "

## 2024-07-31 ENCOUNTER — Inpatient Hospital Stay: Admitting: Hematology & Oncology

## 2024-07-31 ENCOUNTER — Other Ambulatory Visit (HOSPITAL_BASED_OUTPATIENT_CLINIC_OR_DEPARTMENT_OTHER): Payer: Self-pay

## 2024-07-31 ENCOUNTER — Encounter: Payer: Self-pay | Admitting: Hematology & Oncology

## 2024-07-31 ENCOUNTER — Inpatient Hospital Stay

## 2024-07-31 MED ORDER — CARVEDILOL 25 MG PO TABS: 25.0000 mg | ORAL_TABLET | Freq: Two times a day (BID) | ORAL | 1 refills | Status: AC

## 2024-07-31 MED ORDER — LANSOPRAZOLE 30 MG PO CPDR: 30.0000 mg | DELAYED_RELEASE_CAPSULE | Freq: Two times a day (BID) | ORAL | 1 refills | Status: AC

## 2024-07-31 MED ORDER — EMPAGLIFLOZIN 25 MG PO TABS
25.0000 mg | ORAL_TABLET | Freq: Every day | ORAL | 1 refills | Status: DC
  Filled 2024-08-01: qty 20, 20d supply, fill #0

## 2024-07-31 MED ORDER — BACLOFEN 10 MG PO TABS: 10.0000 mg | ORAL_TABLET | Freq: Two times a day (BID) | ORAL | 1 refills | Status: AC | PRN

## 2024-07-31 MED ORDER — CARVEDILOL 25 MG PO TABS
25.0000 mg | ORAL_TABLET | Freq: Two times a day (BID) | ORAL | 1 refills | Status: AC
  Filled 2024-08-01: qty 40, 20d supply, fill #0

## 2024-07-31 MED ORDER — LEVOTHYROXINE SODIUM 75 MCG PO TABS: 75.0000 ug | ORAL_TABLET | Freq: Every morning | ORAL | 0 refills | Status: AC

## 2024-07-31 MED ORDER — TRIAMCINOLONE ACETONIDE 0.1 % EX CREA: TOPICAL_CREAM | CUTANEOUS | 1 refills | Status: AC

## 2024-07-31 MED ORDER — SERTRALINE HCL 100 MG PO TABS: 100.0000 mg | ORAL_TABLET | Freq: Every day | ORAL | 3 refills | Status: AC

## 2024-07-31 MED ORDER — ACCU-CHEK GUIDE TEST VI STRP: ORAL_STRIP | 11 refills | Status: AC

## 2024-07-31 MED ORDER — LEVOTHYROXINE SODIUM 75 MCG PO TABS: 75.0000 ug | ORAL_TABLET | Freq: Every morning | ORAL | 2 refills | Status: AC

## 2024-07-31 MED ORDER — BAQSIMI TWO PACK 3 MG/DOSE NA POWD: NASAL | 1 refills | Status: AC

## 2024-07-31 MED ORDER — METRONIDAZOLE 0.75 % EX CREA: TOPICAL_CREAM | CUTANEOUS | 2 refills | Status: AC

## 2024-07-31 MED ORDER — ACCU-CHEK SOFTCLIX LANCETS MISC: 3 refills | Status: AC

## 2024-07-31 MED ORDER — CHLORTHALIDONE 25 MG PO TABS
25.0000 mg | ORAL_TABLET | Freq: Every day | ORAL | 1 refills | Status: AC
  Filled 2024-08-01: qty 90, 90d supply, fill #0

## 2024-07-31 MED ORDER — ACCU-CHEK FASTCLIX LANCETS MISC: 11 refills | Status: AC

## 2024-07-31 MED ORDER — SERTRALINE HCL 50 MG PO TABS
50.0000 mg | ORAL_TABLET | Freq: Every day | ORAL | 5 refills | Status: AC
  Filled 2024-08-01: qty 30, 30d supply, fill #0

## 2024-07-31 MED ORDER — PIOGLITAZONE HCL 45 MG PO TABS: 45.0000 mg | ORAL_TABLET | Freq: Every day | ORAL | 1 refills | Status: AC

## 2024-08-01 ENCOUNTER — Other Ambulatory Visit: Payer: Self-pay

## 2024-08-01 ENCOUNTER — Other Ambulatory Visit (HOSPITAL_BASED_OUTPATIENT_CLINIC_OR_DEPARTMENT_OTHER): Payer: Self-pay

## 2024-08-01 MED ORDER — JARDIANCE 25 MG PO TABS
25.0000 mg | ORAL_TABLET | Freq: Every day | ORAL | 0 refills | Status: AC
  Filled 2024-08-01: qty 90, 90d supply, fill #0

## 2024-08-06 ENCOUNTER — Inpatient Hospital Stay

## 2024-08-06 ENCOUNTER — Inpatient Hospital Stay: Admitting: Medical Oncology

## 2024-08-11 ENCOUNTER — Inpatient Hospital Stay

## 2024-08-11 ENCOUNTER — Inpatient Hospital Stay: Admitting: Medical Oncology

## 2024-08-28 ENCOUNTER — Ambulatory Visit: Admitting: Physician Assistant
